# Patient Record
Sex: Male | Born: 1975 | Race: Black or African American | Hispanic: No | Marital: Single | Smoking: Never smoker
Health system: Southern US, Community
[De-identification: ages and names within clinical notes are randomized; demographics above are authoritative.]

## PROBLEM LIST (undated history)

## (undated) DIAGNOSIS — I10 Essential (primary) hypertension: Secondary | ICD-10-CM

## (undated) DIAGNOSIS — D649 Anemia, unspecified: Secondary | ICD-10-CM

## (undated) DIAGNOSIS — N289 Disorder of kidney and ureter, unspecified: Secondary | ICD-10-CM

## (undated) DIAGNOSIS — T7840XA Allergy, unspecified, initial encounter: Secondary | ICD-10-CM

## (undated) DIAGNOSIS — K219 Gastro-esophageal reflux disease without esophagitis: Secondary | ICD-10-CM

## (undated) DIAGNOSIS — R011 Cardiac murmur, unspecified: Secondary | ICD-10-CM

## (undated) DIAGNOSIS — K922 Gastrointestinal hemorrhage, unspecified: Secondary | ICD-10-CM

## (undated) DIAGNOSIS — K921 Melena: Secondary | ICD-10-CM

## (undated) DIAGNOSIS — Z992 Dependence on renal dialysis: Secondary | ICD-10-CM

## (undated) DIAGNOSIS — C801 Malignant (primary) neoplasm, unspecified: Secondary | ICD-10-CM

## (undated) DIAGNOSIS — N186 End stage renal disease: Secondary | ICD-10-CM

## (undated) DIAGNOSIS — E875 Hyperkalemia: Secondary | ICD-10-CM

## (undated) DIAGNOSIS — Z9289 Personal history of other medical treatment: Secondary | ICD-10-CM

## (undated) DIAGNOSIS — Z940 Kidney transplant status: Secondary | ICD-10-CM

## (undated) HISTORY — DX: Allergy, unspecified, initial encounter: T78.40XA

## (undated) SURGERY — COLONOSCOPY
Anesthesia: Moderate Sedation

---

## 1999-08-01 ENCOUNTER — Ambulatory Visit (HOSPITAL_COMMUNITY): Admission: RE | Admit: 1999-08-01 | Discharge: 1999-08-01 | Payer: Self-pay | Admitting: *Deleted

## 1999-08-02 ENCOUNTER — Observation Stay (HOSPITAL_COMMUNITY): Admission: EM | Admit: 1999-08-02 | Discharge: 1999-08-02 | Payer: Self-pay | Admitting: Nephrology

## 2005-10-21 ENCOUNTER — Ambulatory Visit (HOSPITAL_COMMUNITY): Admission: RE | Admit: 2005-10-21 | Discharge: 2005-10-21 | Payer: Self-pay | Admitting: *Deleted

## 2006-01-14 HISTORY — PX: ARTERIOVENOUS GRAFT PLACEMENT: SUR1029

## 2006-01-29 ENCOUNTER — Ambulatory Visit: Admission: RE | Admit: 2006-01-29 | Discharge: 2006-01-29 | Payer: Self-pay | Admitting: *Deleted

## 2006-08-11 ENCOUNTER — Ambulatory Visit (HOSPITAL_COMMUNITY): Admission: RE | Admit: 2006-08-11 | Discharge: 2006-08-11 | Payer: Self-pay | Admitting: Nephrology

## 2009-03-06 ENCOUNTER — Ambulatory Visit (HOSPITAL_COMMUNITY): Admission: RE | Admit: 2009-03-06 | Discharge: 2009-03-06 | Payer: Self-pay | Admitting: Vascular Surgery

## 2011-03-26 LAB — POCT I-STAT 4, (NA,K, GLUC, HGB,HCT)
Glucose, Bld: 86 mg/dL (ref 70–99)
HCT: 43 % (ref 39.0–52.0)
Hemoglobin: 14.6 g/dL (ref 13.0–17.0)
Potassium: 5 mEq/L (ref 3.5–5.1)
Sodium: 136 mEq/L (ref 135–145)

## 2011-05-01 NOTE — Op Note (Signed)
Tim Baker, BOURBON             ACCOUNT NO.:  [id number]   MEDICAL RECORD NO.:  [id number]          PATIENT TYPE:  AMB   LOCATION:  SDS                          FACILITY:  [facility]   PHYSICIAN:  Dorothea Glassman, M.D.    DATE OF BIRTH:  23-Dec-1975   DATE OF PROCEDURE:  10/21/2005  DATE OF DISCHARGE:                                 OPERATIVE REPORT   SURGEON:  Dorothea Glassman, M.D.   ASSISTANT:  Nurse.   ANESTHETIC:  Local with MAC.   PREOPERATIVE DIAGNOSIS:  Chronic renal insufficiency.   POSTOPERATIVE DIAGNOSIS:  Chronic renal insufficiency.   PROCEDURE:  Left CIMINO arteriovenous fistula.   OPERATIVE PROCEDURE:  The patient brought to the operating room in stable  condition. Placed in supine position. Left arm prepped and draped in a  sterile fashion.   Skin and subcutaneous tissues instilled with 1% Xylocaine with epinephrine.  A longitudinal skin incision made through the left wrist. Dissection carried  down to expose the cephalic vein. This was 4 mm in size. Cephalic vein  freed. Tributaries ligated with 4-0 silk and divided. The vein ligated  distally with 3-0 silk; divided, and easily dilated to 4 mm. The vein  controlled with a bulldog clamp. The patient administered 3000 units heparin  intravenously. The radial artery exposed. This was 3 mm in size. The radial  artery freed. Tributaries controlled with fine clips. Radial artery  controlled proximally and distally with bulldog clamps. A longitudinal  arteriotomy made. The vein anastomosed end-to-side to the radial artery  using running 7-0 Prolene suture. Clamps were then removed. Excellent flow  present. Adequate hemostasis obtained. Sponge and instrument counts correct.   Subcutaneous tissue closed running 3-0 Vicryl suture. Skin closed 4-0  Monocryl. Steri-Strips applied. No apparent complications. The patient  transferred to recovery room in stable condition.      Dorothea Glassman, M.D.  Electronically  Signed     PGH/MEDQ  D:  10/21/2005  T:  10/21/2005  Job:  [id number]

## 2011-05-01 NOTE — Op Note (Signed)
[name], Tim Baker             ACCOUNT NO.:  [id number]   MEDICAL RECORD NO.:  [id number]          PATIENT TYPE:  AMB   LOCATION:  DFTL                         FACILITY:  [facility]   PHYSICIAN:  Nelda Severe. Kellie Simmering, M.D.  DATE OF BIRTH:  1976/01/15   DATE OF PROCEDURE:  01/29/2006  DATE OF DISCHARGE:  01/29/2006                                 OPERATIVE REPORT   PREOPERATIVE DIAGNOSIS:  End stage renal disease with poorly developing  arteriovenous fistula, left forearm.   POSTOPERATIVE DIAGNOSIS:  End stage renal disease with poorly developing  arteriovenous fistula, left forearm.   OPERATION:  Revision arteriovenous fistula left forearm by ligation of two  completing branches.   SURGEON:  Nelda Severe. Kellie Simmering, M.D.   ASSISTANT:  Faylene Million Dominick, P.A.-C.   ANESTHESIA:  Local.   PROCEDURE:  The patient was taken to the operating room and placed in the  supine position at which time the left upper extremity was prepped with  Betadine scrub solution and draped in routine sterile manner.  Two the areas  of concern had been marked in the left upper extremity, one which was a  proximal branch from the fistula near the wrist competing with the flow and  midway up the arm, there were two parallel cephalic vein systems, the  smaller of which was to be ligated.  After infiltration with 1% Xylocaine  with epinephrine, a short transverse incision was made just proximal to the  wrist directly over the side branch which was dissected free with Metzenbaum  scissors and encircled with 3-0 silk ties and divided.  Following this, a  second incision was made in the mid forearm overlying the lateral most  branch of the cephalic vein where it divided and became bifid.  The lateral  branch was dissected free, encircled with silk tie, and temporarily  compressed, and this did seem to slightly augment the flow in the fistula  over the medial branch which was larger. The lateral branch was ligated.  Adequate hemostasis achieved, and the two wounds closed with Vicryl in a  subcuticular fashion. Sterile dressing applied.  The patient taken to  recovery in satisfactory condition.           ______________________________  Nelda Severe Kellie Simmering, M.D.     JDL/MEDQ  D:  01/29/2006  T:  01/29/2006  Job:  [id number]

## 2011-06-19 ENCOUNTER — Other Ambulatory Visit (HOSPITAL_COMMUNITY): Payer: Self-pay | Admitting: Nephrology

## 2011-06-19 DIAGNOSIS — N186 End stage renal disease: Secondary | ICD-10-CM

## 2011-06-26 ENCOUNTER — Ambulatory Visit (HOSPITAL_COMMUNITY): Payer: Self-pay

## 2011-07-01 ENCOUNTER — Ambulatory Visit (HOSPITAL_COMMUNITY): Admission: RE | Admit: 2011-07-01 | Payer: Self-pay | Source: Ambulatory Visit

## 2011-07-06 ENCOUNTER — Other Ambulatory Visit (HOSPITAL_COMMUNITY): Payer: Self-pay | Admitting: Nephrology

## 2011-07-06 DIAGNOSIS — N186 End stage renal disease: Secondary | ICD-10-CM

## 2011-07-28 ENCOUNTER — Ambulatory Visit (HOSPITAL_COMMUNITY)
Admission: RE | Admit: 2011-07-28 | Discharge: 2011-07-28 | Disposition: A | Discharge status: Home / Self Care | Payer: Medicare Other | Source: Ambulatory Visit | Attending: Nephrology | Admitting: Nephrology

## 2011-07-28 ENCOUNTER — Ambulatory Visit (HOSPITAL_COMMUNITY): Payer: Self-pay

## 2011-07-28 DIAGNOSIS — T82898A Other specified complication of vascular prosthetic devices, implants and grafts, initial encounter: Secondary | ICD-10-CM | POA: Insufficient documentation

## 2011-07-28 DIAGNOSIS — N186 End stage renal disease: Secondary | ICD-10-CM | POA: Insufficient documentation

## 2011-07-28 DIAGNOSIS — Y832 Surgical operation with anastomosis, bypass or graft as the cause of abnormal reaction of the patient, or of later complication, without mention of misadventure at the time of the procedure: Secondary | ICD-10-CM | POA: Insufficient documentation

## 2011-07-28 MED ORDER — IOHEXOL 300 MG/ML  SOLN
100.0000 mL | Freq: Once | INTRAMUSCULAR | Status: AC | PRN
  Administered 2011-07-28: 50 mL via INTRAVENOUS

## 2011-12-15 HISTORY — PX: KIDNEY TRANSPLANT: SHX239

## 2015-11-14 HISTORY — PX: AV FISTULA REPAIR: SHX563

## 2015-11-14 HISTORY — PX: INSERTION OF DIALYSIS CATHETER: SHX1324

## 2015-12-02 ENCOUNTER — Inpatient Hospital Stay (HOSPITAL_COMMUNITY)
Admission: EM | Admit: 2015-12-02 | Discharge: 2015-12-03 | DRG: 699 | Disposition: A | Discharge status: Other Acute Inpatient Hospital | Payer: Medicaid Other | Attending: Internal Medicine | Admitting: Internal Medicine

## 2015-12-02 ENCOUNTER — Inpatient Hospital Stay (HOSPITAL_COMMUNITY): Payer: Medicaid Other

## 2015-12-02 ENCOUNTER — Emergency Department (HOSPITAL_COMMUNITY): Payer: Medicaid Other

## 2015-12-02 ENCOUNTER — Encounter (HOSPITAL_COMMUNITY): Payer: Self-pay | Admitting: Emergency Medicine

## 2015-12-02 DIAGNOSIS — E875 Hyperkalemia: Secondary | ICD-10-CM | POA: Diagnosis present

## 2015-12-02 DIAGNOSIS — N189 Chronic kidney disease, unspecified: Secondary | ICD-10-CM | POA: Diagnosis present

## 2015-12-02 DIAGNOSIS — Z598 Other problems related to housing and economic circumstances: Secondary | ICD-10-CM

## 2015-12-02 DIAGNOSIS — R0989 Other specified symptoms and signs involving the circulatory and respiratory systems: Secondary | ICD-10-CM

## 2015-12-02 DIAGNOSIS — J029 Acute pharyngitis, unspecified: Secondary | ICD-10-CM | POA: Diagnosis present

## 2015-12-02 DIAGNOSIS — E872 Acidosis, unspecified: Secondary | ICD-10-CM

## 2015-12-02 DIAGNOSIS — Y830 Surgical operation with transplant of whole organ as the cause of abnormal reaction of the patient, or of later complication, without mention of misadventure at the time of the procedure: Secondary | ICD-10-CM | POA: Diagnosis present

## 2015-12-02 DIAGNOSIS — K529 Noninfective gastroenteritis and colitis, unspecified: Secondary | ICD-10-CM | POA: Diagnosis present

## 2015-12-02 DIAGNOSIS — I129 Hypertensive chronic kidney disease with stage 1 through stage 4 chronic kidney disease, or unspecified chronic kidney disease: Secondary | ICD-10-CM | POA: Diagnosis present

## 2015-12-02 DIAGNOSIS — Z940 Kidney transplant status: Secondary | ICD-10-CM | POA: Diagnosis present

## 2015-12-02 DIAGNOSIS — D631 Anemia in chronic kidney disease: Secondary | ICD-10-CM | POA: Diagnosis present

## 2015-12-02 DIAGNOSIS — I10 Essential (primary) hypertension: Secondary | ICD-10-CM | POA: Diagnosis present

## 2015-12-02 DIAGNOSIS — Z79899 Other long term (current) drug therapy: Secondary | ICD-10-CM

## 2015-12-02 DIAGNOSIS — N179 Acute kidney failure, unspecified: Secondary | ICD-10-CM | POA: Diagnosis present

## 2015-12-02 DIAGNOSIS — T8611 Kidney transplant rejection: Secondary | ICD-10-CM | POA: Diagnosis present

## 2015-12-02 HISTORY — DX: Essential (primary) hypertension: I10

## 2015-12-02 HISTORY — DX: Disorder of kidney and ureter, unspecified: N28.9

## 2015-12-02 LAB — LIPASE, BLOOD: Lipase: 68 U/L — ABNORMAL HIGH (ref 11–51)

## 2015-12-02 LAB — CBC
HCT: 33.7 % — ABNORMAL LOW (ref 39.0–52.0)
HCT: 33 % — ABNORMAL LOW (ref 39.0–52.0)
Hemoglobin: 10.7 g/dL — ABNORMAL LOW (ref 13.0–17.0)
Hemoglobin: 10.8 g/dL — ABNORMAL LOW (ref 13.0–17.0)
MCH: 25.5 pg — ABNORMAL LOW (ref 26.0–34.0)
MCH: 26 pg (ref 26.0–34.0)
MCHC: 31.8 g/dL (ref 30.0–36.0)
MCHC: 32.7 g/dL (ref 30.0–36.0)
MCV: 80.2 fL (ref 78.0–100.0)
MCV: 79.5 fL (ref 78.0–100.0)
Platelets: 379 10*3/uL (ref 150–400)
Platelets: 392 10*3/uL (ref 150–400)
RBC: 4.2 MIL/uL — ABNORMAL LOW (ref 4.22–5.81)
RBC: 4.15 MIL/uL — ABNORMAL LOW (ref 4.22–5.81)
RDW: 14 % (ref 11.5–15.5)
RDW: 14 % (ref 11.5–15.5)
WBC: 6.9 10*3/uL (ref 4.0–10.5)
WBC: 9.2 10*3/uL (ref 4.0–10.5)

## 2015-12-02 LAB — I-STAT CG4 LACTIC ACID, ED: Lactic Acid, Venous: 0.38 mmol/L — ABNORMAL LOW (ref 0.5–2.0)

## 2015-12-02 LAB — COMPREHENSIVE METABOLIC PANEL
ALT: 19 U/L (ref 17–63)
AST: 8 U/L — ABNORMAL LOW (ref 15–41)
Albumin: 3.3 g/dL — ABNORMAL LOW (ref 3.5–5.0)
Alkaline Phosphatase: 66 U/L (ref 38–126)
Anion gap: 24 — ABNORMAL HIGH (ref 5–15)
BUN: 85 mg/dL — ABNORMAL HIGH (ref 6–20)
CO2: 9 mmol/L — ABNORMAL LOW (ref 22–32)
Calcium: 8.3 mg/dL — ABNORMAL LOW (ref 8.9–10.3)
Chloride: 97 mmol/L — ABNORMAL LOW (ref 101–111)
Creatinine, Ser: 15.1 mg/dL — ABNORMAL HIGH (ref 0.61–1.24)
GFR calc Af Amer: 4 mL/min — ABNORMAL LOW (ref >60)
GFR calc non Af Amer: 3 mL/min — ABNORMAL LOW (ref >60)
Glucose, Bld: 94 mg/dL (ref 65–99)
Potassium: >7.5 mmol/L (ref 3.5–5.1)
Sodium: 130 mmol/L — ABNORMAL LOW (ref 135–145)
Total Bilirubin: 0.2 mg/dL — ABNORMAL LOW (ref 0.3–1.2)
Total Protein: 8.3 g/dL — ABNORMAL HIGH (ref 6.5–8.1)

## 2015-12-02 LAB — PROTIME-INR
INR: 1.31 (ref 0.00–1.49)
Prothrombin Time: 16.5 seconds — ABNORMAL HIGH (ref 11.6–15.2)

## 2015-12-02 LAB — MRSA PCR SCREENING: MRSA by PCR: NEGATIVE

## 2015-12-02 LAB — LACTIC ACID, PLASMA: Lactic Acid, Venous: 0.5 mmol/L (ref 0.5–2.0)

## 2015-12-02 LAB — I-STAT TROPONIN, ED: Troponin i, poc: 0.02 ng/mL (ref 0.00–0.08)

## 2015-12-02 LAB — TROPONIN I: Troponin I: <0.03 ng/mL (ref <0.031)

## 2015-12-02 LAB — CBG MONITORING, ED: Glucose-Capillary: 81 mg/dL (ref 65–99)

## 2015-12-02 LAB — APTT: aPTT: 40 seconds — ABNORMAL HIGH (ref 24–37)

## 2015-12-02 MED ORDER — TACROLIMUS 1 MG PO CAPS
3.0000 mg | ORAL_CAPSULE | Freq: Every day | ORAL | Status: DC
  Administered 2015-12-03: 3 mg via ORAL
  Filled 2015-12-02 (×2): qty 3

## 2015-12-02 MED ORDER — METHYLPREDNISOLONE SODIUM SUCC 1000 MG IJ SOLR
500.0000 mg | Freq: Once | INTRAMUSCULAR | Status: DC
  Filled 2015-12-02: qty 4

## 2015-12-02 MED ORDER — MYCOPHENOLATE SODIUM 180 MG PO TBEC
540.0000 mg | DELAYED_RELEASE_TABLET | Freq: Two times a day (BID) | ORAL | Status: DC
  Administered 2015-12-03 (×2): 540 mg via ORAL
  Filled 2015-12-02 (×4): qty 3

## 2015-12-02 MED ORDER — SODIUM BICARBONATE 8.4 % IV SOLN
50.0000 meq | Freq: Once | INTRAVENOUS | Status: AC
  Administered 2015-12-02: 50 meq via INTRAVENOUS
  Filled 2015-12-02: qty 50

## 2015-12-02 MED ORDER — SODIUM CHLORIDE 0.9 % IV SOLN: 250.0000 mL | INTRAVENOUS | Status: DC | PRN

## 2015-12-02 MED ORDER — HYDROCODONE-ACETAMINOPHEN 5-325 MG PO TABS: 1.0000 | ORAL_TABLET | ORAL | Status: DC | PRN

## 2015-12-02 MED ORDER — ALBUTEROL SULFATE (2.5 MG/3ML) 0.083% IN NEBU
10.0000 mg | INHALATION_SOLUTION | Freq: Once | RESPIRATORY_TRACT | Status: AC
  Administered 2015-12-02: 10 mg via RESPIRATORY_TRACT
  Filled 2015-12-02: qty 12

## 2015-12-02 MED ORDER — SODIUM CHLORIDE 0.9 % IJ SOLN
3.0000 mL | INTRAMUSCULAR | Status: DC | PRN
  Administered 2015-12-03: 3 mL via INTRAVENOUS
  Filled 2015-12-02: qty 3

## 2015-12-02 MED ORDER — ALBUTEROL SULFATE (2.5 MG/3ML) 0.083% IN NEBU
INHALATION_SOLUTION | RESPIRATORY_TRACT | Status: AC
Start: 2015-12-02 — End: 2015-12-03
  Filled 2015-12-02: qty 12

## 2015-12-02 MED ORDER — SODIUM CHLORIDE 0.9 % IJ SOLN
3.0000 mL | Freq: Two times a day (BID) | INTRAMUSCULAR | Status: DC
  Administered 2015-12-03: 3 mL via INTRAVENOUS

## 2015-12-02 MED ORDER — HEPARIN SODIUM (PORCINE) 5000 UNIT/ML IJ SOLN
5000.0000 [IU] | Freq: Three times a day (TID) | INTRAMUSCULAR | Status: DC
  Administered 2015-12-03 (×2): 5000 [IU] via SUBCUTANEOUS
  Filled 2015-12-02 (×4): qty 1

## 2015-12-02 MED ORDER — MAGIC MOUTHWASH W/LIDOCAINE
5.0000 mL | Freq: Four times a day (QID) | ORAL | Status: DC
  Administered 2015-12-03 (×3): 5 mL via ORAL
  Filled 2015-12-02 (×6): qty 5

## 2015-12-02 MED ORDER — INSULIN ASPART 100 UNIT/ML IV SOLN
10.0000 [IU] | Freq: Once | INTRAVENOUS | Status: AC
  Administered 2015-12-02: 10 [IU] via INTRAVENOUS
  Filled 2015-12-02: qty 0.1

## 2015-12-02 MED ORDER — ALBUTEROL SULFATE (2.5 MG/3ML) 0.083% IN NEBU: 2.5000 mg | INHALATION_SOLUTION | RESPIRATORY_TRACT | Status: DC | PRN

## 2015-12-02 MED ORDER — AMLODIPINE BESYLATE 10 MG PO TABS
10.0000 mg | ORAL_TABLET | Freq: Every day | ORAL | Status: DC
  Administered 2015-12-03 (×2): 10 mg via ORAL
  Filled 2015-12-02 (×3): qty 1

## 2015-12-02 MED ORDER — ACETAMINOPHEN 325 MG PO TABS: 650.0000 mg | ORAL_TABLET | Freq: Four times a day (QID) | ORAL | Status: DC | PRN

## 2015-12-02 MED ORDER — HEPARIN SODIUM (PORCINE) 1000 UNIT/ML DIALYSIS: 20.0000 [IU]/kg | INTRAMUSCULAR | Status: DC | PRN

## 2015-12-02 MED ORDER — ONDANSETRON HCL 4 MG/2ML IJ SOLN: 4.0000 mg | Freq: Four times a day (QID) | INTRAMUSCULAR | Status: DC | PRN

## 2015-12-02 MED ORDER — SODIUM CHLORIDE 0.9 % IV SOLN
1.0000 g | Freq: Once | INTRAVENOUS | Status: AC
  Administered 2015-12-02: 1 g via INTRAVENOUS
  Filled 2015-12-02: qty 10

## 2015-12-02 MED ORDER — DEXTROSE 50 % IV SOLN
1.0000 | Freq: Once | INTRAVENOUS | Status: AC
  Administered 2015-12-02: 50 mL via INTRAVENOUS
  Filled 2015-12-02: qty 50

## 2015-12-02 MED ORDER — TACROLIMUS 1 MG PO CAPS: 4.0000 mg | ORAL_CAPSULE | Freq: Every morning | ORAL | Status: DC

## 2015-12-02 MED ORDER — SODIUM CHLORIDE 0.9 % IV SOLN
1000.0000 mg | Freq: Every day | INTRAVENOUS | Status: DC
  Administered 2015-12-03: 1000 mg via INTRAVENOUS
  Filled 2015-12-02 (×2): qty 8

## 2015-12-02 MED ORDER — PENTAFLUOROPROP-TETRAFLUOROETH EX AERO: 1.0000 "application " | INHALATION_SPRAY | CUTANEOUS | Status: DC | PRN

## 2015-12-02 MED ORDER — SODIUM CHLORIDE 0.9 % IV SOLN: 100.0000 mL | INTRAVENOUS | Status: DC | PRN

## 2015-12-02 MED ORDER — ALTEPLASE 2 MG IJ SOLR: 2.0000 mg | Freq: Once | INTRAMUSCULAR | Status: DC | PRN

## 2015-12-02 MED ORDER — GUAIFENESIN 100 MG/5ML PO SOLN
5.0000 mL | ORAL | Status: DC | PRN
  Filled 2015-12-02: qty 5

## 2015-12-02 MED ORDER — SODIUM POLYSTYRENE SULFONATE 15 GM/60ML PO SUSP
30.0000 g | Freq: Once | ORAL | Status: AC
  Administered 2015-12-02: 30 g via ORAL
  Filled 2015-12-02 (×2): qty 120

## 2015-12-02 MED ORDER — LIDOCAINE HCL (PF) 1 % IJ SOLN: 5.0000 mL | INTRAMUSCULAR | Status: DC | PRN

## 2015-12-02 MED ORDER — ONDANSETRON HCL 4 MG PO TABS: 4.0000 mg | ORAL_TABLET | Freq: Four times a day (QID) | ORAL | Status: DC | PRN

## 2015-12-02 MED ORDER — SULFAMETHOXAZOLE-TRIMETHOPRIM 800-160 MG PO TABS
1.0000 | ORAL_TABLET | ORAL | Status: DC
Start: 2015-12-02 — End: 2015-12-03
  Administered 2015-12-02: 1 via ORAL
  Filled 2015-12-02 (×2): qty 1

## 2015-12-02 MED ORDER — MORPHINE SULFATE (PF) 2 MG/ML IV SOLN: 2.0000 mg | INTRAVENOUS | Status: DC | PRN

## 2015-12-02 MED ORDER — ACETAMINOPHEN 650 MG RE SUPP: 650.0000 mg | Freq: Four times a day (QID) | RECTAL | Status: DC | PRN

## 2015-12-02 MED ORDER — HEPARIN SODIUM (PORCINE) 1000 UNIT/ML DIALYSIS: 1000.0000 [IU] | INTRAMUSCULAR | Status: DC | PRN

## 2015-12-02 MED ORDER — PREDNISONE 10 MG PO TABS: 5.0000 mg | ORAL_TABLET | Freq: Every day | ORAL | Status: DC

## 2015-12-02 MED ORDER — LIDOCAINE-PRILOCAINE 2.5-2.5 % EX CREA: 1.0000 "application " | TOPICAL_CREAM | CUTANEOUS | Status: DC | PRN

## 2015-12-02 MED ORDER — TACROLIMUS 1 MG PO CAPS
1.0000 mg | ORAL_CAPSULE | Freq: Two times a day (BID) | ORAL | Status: DC
  Filled 2015-12-02: qty 1

## 2015-12-02 MED ORDER — LABETALOL HCL 200 MG PO TABS
300.0000 mg | ORAL_TABLET | Freq: Two times a day (BID) | ORAL | Status: DC
  Administered 2015-12-03 (×2): 300 mg via ORAL
  Filled 2015-12-02: qty 2
  Filled 2015-12-02: qty 1
  Filled 2015-12-02: qty 2

## 2015-12-02 NOTE — ED Notes (Signed)
Per EMS, Pt c/o diarrhea x 1 week. Pt also had a sore throat and pt was given amoxicillin for it. Pt has been taking without improvement. Pt c/o weakness, unable to eat, fatigue. Pt has hx of kidney transplant in 2003. Pt has a fistula on the L arm. A&Ox4. Pt from home. Pt has a roommate.

## 2015-12-02 NOTE — Progress Notes (Signed)
Pt arrived to [facility] at 2045. Pt is very lethargic but awakens with stimulation. Hypertensive with syst 190's, NSR, no complaints of pain.  RN brought pt up to dialysis at 2115.

## 2015-12-02 NOTE — Consult Note (Signed)
Asked by Dr. Nicolette Bang to see this 39 year old male with a history of a deceased doner kidney transplant 03/26/12 at [facility] and baseline creat of 2.1-2.3 followed by Dr. Marval Regal.  He has done well. Pt last seen by him on August 01, 2015 for a 6 month follow up. Creat on 07/26/15 was 1.'96mg'$ /dl at [facility]. Unfortunately, the patient recently changed jobs and had an inferior insurance plan which did not adequately cover the immunosuppressant medications.  The patient reports being out of anti-rejection meds for two weeks and he "did not know who to turn to for help".  He has had malaise, arthralgias, diarrhea and a sore throat and apparently received antibiotics.  He has had persistent malaise and presented to [facility] ED with a BUN of 85, creat of 15.10, bicarb of 9 and potassium of > 7.5 with peaked T waves which was treatted in the ED with the standard medication.  Dr. Ronalee Red was contacted at [facility] who recommended medical stabilization. He receives Mycophenolic acid '540mg'$  BID, Prednisone 5 mg daily, and Tacrolimus '4mg'$  in AM and '3mg'$  in PM as antirejection regimen(according to the August 2016 note).  He denies gross hematuria or abdominal pain.  Past Medical History  Diagnosis Date  . Hypertension   . Renal disorder    Past Surgical History  Procedure Laterality Date  . Kidney transplant      2013   Social History:  reports that he has never smoked. He has never used smokeless tobacco. He reports that he drinks alcohol. He reports that he does not use illicit drugs. Allergies: No Known Allergies No family history on file.  Medications:  Prior to Admission:  Prescriptions prior to admission  Medication Sig Dispense Refill Last Dose  . amLODipine (NORVASC) 10 MG tablet Take 10 mg by mouth daily.   12/01/2015 at Unknown time  . amoxicillin (AMOXIL) 875 MG tablet Take 875 mg by mouth 2 (two) times daily.   12/02/2015 at Unknown time  . guaiFENesin (ROBITUSSIN) 100 MG/5ML SOLN Take 5 mLs by  mouth every 4 (four) hours as needed for cough or to loosen phlegm.   12/01/2015 at Unknown time  . labetalol (NORMODYNE) 300 MG tablet Take 300 mg by mouth 2 (two) times daily.   12/02/2015 at 0900  . magic mouthwash w/lidocaine SOLN Take 5 mLs by mouth 4 (four) times daily. Swish,gargle, and spit   12/02/2015 at Unknown time  . mycophenolate (MYFORTIC) 180 MG EC tablet Take 540 mg by mouth 2 (two) times daily.   12/01/2015 at Unknown time  . predniSONE (DELTASONE) 5 MG tablet Take 5 mg by mouth daily.  6 12/01/2015 at Unknown time  . sulfamethoxazole-trimethoprim (BACTRIM DS,SEPTRA DS) 800-160 MG tablet Take 1 tablet by mouth 3 (three) times a week. Every Monday, Wednesday, and Friday   11/29/2015  . tacrolimus (PROGRAF) 1 MG capsule Take 1 mg by mouth 2 (two) times daily.   12/01/2015 at Unknown time   Scheduled: . albuterol      . amLODipine  10 mg Oral Daily  . heparin  5,000 Units Subcutaneous 3 times per day  . labetalol  300 mg Oral BID  . magic mouthwash w/lidocaine  5 mL Oral QID  . methylPREDNISolone (SOLU-MEDROL) injection  500 mg Intravenous Once  . mycophenolate  540 mg Oral BID  . [START ON 12/03/2015] predniSONE  5 mg Oral Daily  . sodium chloride  3 mL Intravenous Q12H  . sodium chloride  3 mL Intravenous  Q12H  . sulfamethoxazole-trimethoprim  1 tablet Oral Once per day on Mon Wed Fri  . tacrolimus  1 mg Oral BID   ROS: as per HPI  Blood pressure 192/102, pulse 70, temperature 97.5 F (36.4 C), resp. rate 18, SpO2 99 %.  General appearance: alert, cooperative and easily falls asleep Head: Normocephalic, without obvious abnormality, atraumatic Eyes: conjunctivae/corneas clear. PERRL, EOM's intact. Fundi benign. Ears: normal TM's and external ear canals both ears Nose: Nares normal. Septum midline. Mucosa normal. No drainage or sinus tenderness. Throat: lips, mucosa, and tongue normal; teeth and gums normal Resp: clear to auscultation bilaterally Chest wall:  nontender Cardio: regular rate and rhythm, S1, S2 normal, no murmur, click, rub or gallop GI: very tender renal allograft RLQ  Otherwise obese and nontender Extremities: edema tr Skin: Skin color, texture, turgor normal. No rashes or lesions Neurologic: Grossly normal Results for orders placed or performed during the hospital encounter of 12/02/15 (from the past 48 hour(s))  Lipase, blood     Status: Abnormal   Collection Time: 12/02/15  3:57 PM  Result Value Ref Range   Lipase 68 (H) 11 - 51 U/L  Comprehensive metabolic panel     Status: Abnormal   Collection Time: 12/02/15  3:57 PM  Result Value Ref Range   Sodium 130 (L) 135 - 145 mmol/L    Comment: REPEATED TO VERIFY   Potassium >7.5 (HH) 3.5 - 5.1 mmol/L    Comment: CRITICAL RESULT CALLED TO, READ BACK BY AND VERIFIED WITH: L.BROWN RN [id number] ON 12.19.16 BY W.BUCHHOLZ. REPEATED TO VERIFY NO VISIBLE HEMOLYSIS PT IN RENAL FAILURE RN CONFIRMED RESULTS AS EXPECTED.    Chloride 97 (L) 101 - 111 mmol/L    Comment: REPEATED TO VERIFY   CO2 9 (L) 22 - 32 mmol/L    Comment: REPEATED TO VERIFY   Glucose, Bld 94 65 - 99 mg/dL   BUN 85 (H) 6 - 20 mg/dL    Comment: RESULTS CONFIRMED BY MANUAL DILUTION   Creatinine, Ser 15.10 (H) 0.61 - 1.24 mg/dL    Comment: RESULTS CONFIRMED BY MANUAL DILUTION   Calcium 8.3 (L) 8.9 - 10.3 mg/dL    Comment: REPEATED TO VERIFY   Total Protein 8.3 (H) 6.5 - 8.1 g/dL   Albumin 3.3 (L) 3.5 - 5.0 g/dL   AST 8 (L) 15 - 41 U/L   ALT 19 17 - 63 U/L   Alkaline Phosphatase 66 38 - 126 U/L   Total Bilirubin 0.2 (L) 0.3 - 1.2 mg/dL   GFR calc non Af Amer 3 (L) >60 mL/min   GFR calc Af Amer 4 (L) >60 mL/min    Comment: (NOTE) The eGFR has been calculated using the CKD EPI equation. This calculation has not been validated in all clinical situations. eGFR's persistently <60 mL/min signify possible Chronic Kidney Disease.    Anion gap 24 (H) 5 - 15  CBC     Status: Abnormal   Collection Time: 12/02/15  3:57  PM  Result Value Ref Range   WBC 6.9 4.0 - 10.5 K/uL   RBC 4.20 (L) 4.22 - 5.81 MIL/uL   Hemoglobin 10.7 (L) 13.0 - 17.0 g/dL   HCT 33.7 (L) 39.0 - 52.0 %   MCV 80.2 78.0 - 100.0 fL   MCH 25.5 (L) 26.0 - 34.0 pg   MCHC 31.8 30.0 - 36.0 g/dL   RDW 14.0 11.5 - 15.5 %   Platelets 379 150 - 400 K/uL  I-Stat Troponin, ED (not  at [facility])     Status: None   Collection Time: 12/02/15  4:54 PM  Result Value Ref Range   Troponin i, poc 0.02 0.00 - 0.08 ng/mL   Comment 3            Comment: Due to the release kinetics of cTnI, a negative result within the first hours of the onset of symptoms does not rule out myocardial infarction with certainty. If myocardial infarction is still suspected, repeat the test at appropriate intervals.   I-Stat CG4 Lactic Acid, ED     Status: Abnormal   Collection Time: 12/02/15  4:57 PM  Result Value Ref Range   Lactic Acid, Venous 0.38 (L) 0.5 - 2.0 mmol/L  CBG monitoring, ED     Status: None   Collection Time: 12/02/15  5:29 PM  Result Value Ref Range   Glucose-Capillary 81 65 - 99 mg/dL   Dg Abd Acute W/chest  12/02/2015  CLINICAL DATA:  Diarrhea for 1-1/2 weeks with nausea, chills, antibiotic usage, weight loss EXAM: DG ABDOMEN ACUTE W/ 1V CHEST COMPARISON:  03/06/2009 FINDINGS: Heart size and vascular pattern normal.Mild left lower lobe infiltrate or atelectasis. No free air in the abdomen. No enlarged bowel loops. Loop of bowel in mid pelvis shows possible wall thickening, likely sigmoid colon. Scattered small and large bowel air fluid levels. IMPRESSION: Air fluid levels suggest enterocolitis; possible mural inflammation distal colon. CT abdomen and pelvis recommended. Electronically Signed   By: Skipper Cliche M.D.   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 2 Deceased donor kidney transplant 3 Hyperkalemia w/ EKG changes 4 Financial resource limitations  Plan: 1 Emergent dialysis 2 Pulse high dose  steroids (Solumedrol 1000 mg tonight) 3 Transfer to [facility] for renal biopsy and further treatment to salvage transplant, after stabilized 4 F/U Urine analysis 5 Imaging studies after transfer as needed   Kenetha Cozza C 12/02/2015, 10:03 PM                             Assessment:  Hyperkalemia   AKI, rule out rejection   Deceased donor kidney transplant 2013 with baseline creat 2.1-2.3  Plan:    Emergency dialysis at [facility] via AVF  Steroid bolus  Hold Tacrolimus pending level

## 2015-12-02 NOTE — ED Notes (Signed)
Patient resting comfortably.  Patient's Ca++ finished.

## 2015-12-02 NOTE — ED Notes (Signed)
[facility] notified of need for transport

## 2015-12-02 NOTE — ED Notes (Signed)
Blood draw attempted-unsuccessful.  Merleen Nicely, RN at [facility] made aware.

## 2015-12-02 NOTE — Procedures (Signed)
Tolerating hemodialysis via left arm AV access.  AVF functioning OK. No hemodynamic instability.  Pt sleepy. Tim Baker C

## 2015-12-02 NOTE — ED Notes (Signed)
Delay in vitals [facility] is his for transportation

## 2015-12-02 NOTE — ED Notes (Signed)
Bed: [id number] Expected date:  Expected time:  Means of arrival:  Comments: ems

## 2015-12-02 NOTE — ED Provider Notes (Addendum)
CSN: [id number]     Arrival date & time 12/02/15  1534 History   First MD Initiated Contact with Patient 12/02/15 1616     Chief Complaint  Patient presents with  . Diarrhea     (Consider location/radiation/quality/duration/timing/severity/associated sxs/prior Treatment) HPI Comments: 39 y.o. 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  The patient says that this has been ongoing and progressive over the last 1.5 weeks and that he was diagnosed with strep throat at that time but has been taking the Amoxicillin without improvement in symptoms.  He says that even before starting the antibiotic he was having diarrhea.  He says that he just does not feel well and feels extremely weak.  He denies abdominal pain, fever, chills, nausea, vomiting.  No headache or focal weakness.   Past Medical History  Diagnosis Date  . Hypertension   . Renal disorder    Past Surgical History  Procedure Laterality Date  . Kidney transplant      2013   No family history on file. Social History  Substance Use Topics  . Smoking status: Never Smoker   . Smokeless tobacco: Never Used  . Alcohol Use: Yes     Comment: one beer a month    Review of Systems  Constitutional: Positive for fatigue. Negative for fever and chills.  HENT: Positive for sore throat. Negative for congestion and rhinorrhea.   Eyes: Negative for visual disturbance.  Respiratory: Negative for cough, chest tightness and shortness of breath.   Cardiovascular: Negative for chest pain and palpitations.  Gastrointestinal: Negative for nausea, vomiting, abdominal pain, diarrhea and constipation.  Genitourinary: Positive for decreased urine volume. Negative for dysuria and hematuria.  Musculoskeletal: Positive for myalgias. Negative for back pain.  Skin: Negative for rash.  Neurological: Positive for weakness (generalized). Negative for dizziness, seizures,  light-headedness and numbness.      Allergies  Review of patient's allergies indicates no known allergies.  Home Medications   Prior to Admission medications   Medication Sig Start Date End Date Taking? Authorizing Provider  amLODipine (NORVASC) 10 MG tablet Take 10 mg by mouth daily.   Yes Historical Provider, MD  amoxicillin (AMOXIL) 875 MG tablet Take 875 mg by mouth 2 (two) times daily.   Yes Historical Provider, MD  guaiFENesin (ROBITUSSIN) 100 MG/5ML SOLN Take 5 mLs by mouth every 4 (four) hours as needed for cough or to loosen phlegm.   Yes Historical Provider, MD  labetalol (NORMODYNE) 300 MG tablet Take 300 mg by mouth 2 (two) times daily.   Yes Historical Provider, MD  magic mouthwash w/lidocaine SOLN Take 5 mLs by mouth 4 (four) times daily. Swish,gargle, and spit   Yes Historical Provider, MD  mycophenolate (MYFORTIC) 180 MG EC tablet Take 540 mg by mouth 2 (two) times daily.   Yes Historical Provider, MD  predniSONE (DELTASONE) 5 MG tablet Take 5 mg by mouth daily. 09/09/15  Yes Historical Provider, MD  sulfamethoxazole-trimethoprim (BACTRIM DS,SEPTRA DS) 800-160 MG tablet Take 1 tablet by mouth 3 (three) times a week. Every Monday, Wednesday, and Friday   Yes Historical Provider, MD  tacrolimus (PROGRAF) 1 MG capsule Take 1 mg by mouth 2 (two) times daily.   Yes Historical Provider, MD   BP 184/87 mmHg  Pulse 72  Temp(Src) 97.5 F (36.4 C)  Resp 20  SpO2 100% Physical Exam  Constitutional: He is oriented to person, place, and time. No  distress.  HENT:  Head: Normocephalic and atraumatic.  Mouth/Throat: Mucous membranes are not dry. No posterior oropharyngeal edema, posterior oropharyngeal erythema or tonsillar abscesses.  Large uvula without edema or erythema  Eyes: EOM are normal. Pupils are equal, round, and reactive to light.  Neck: Normal range of motion. Neck supple.  Pulmonary/Chest: Tachypnea noted. He has no decreased breath sounds. He has no wheezes. He has  no rhonchi.  Abdominal: Normal appearance and bowel sounds are normal. There is no tenderness.  No tenderness over the transplanted kidney  Musculoskeletal: He exhibits no edema or tenderness.  Neurological: He is alert and oriented to person, place, and time.  Skin: Skin is warm and dry. No rash noted. He is not diaphoretic.    ED Course  Procedures (including critical care time)  CRITICAL CARE Performed by: Earlie Server   Total critical care time: 45 minutes  Critical care time was exclusive of separately billable procedures and treating other patients.  Critical care was necessary to treat or prevent imminent or life-threatening deterioration.  Critical care was time spent personally by me on the following activities: development of treatment plan with patient and/or surrogate as well as nursing, discussions with consultants, evaluation of patient's response to treatment, examination of patient, obtaining history from patient or surrogate, ordering and performing treatments and interventions, ordering and review of laboratory studies, ordering and review of radiographic studies, pulse oximetry and re-evaluation of patient's condition.  Labs Review Labs Reviewed  LIPASE, BLOOD - Abnormal; Notable for the following:    Lipase 68 (*)    All other components within normal limits  COMPREHENSIVE METABOLIC PANEL - Abnormal; Notable for the following:    Sodium 130 (*)    Potassium >7.5 (*)    Chloride 97 (*)    CO2 9 (*)    BUN 85 (*)    Creatinine, Ser 15.10 (*)    Calcium 8.3 (*)    Total Protein 8.3 (*)    Albumin 3.3 (*)    AST 8 (*)    Total Bilirubin 0.2 (*)    GFR calc non Af Amer 3 (*)    GFR calc Af Amer 4 (*)    Anion gap 24 (*)    All other components within normal limits  CBC - Abnormal; Notable for the following:    RBC 4.20 (*)    Hemoglobin 10.7 (*)    HCT 33.7 (*)    MCH 25.5 (*)    All other components within normal limits  I-STAT CG4 LACTIC ACID, ED -  Abnormal; Notable for the following:    Lactic Acid, Venous 0.38 (*)    All other components within normal limits  C DIFFICILE QUICK SCREEN W PCR REFLEX  URINALYSIS, ROUTINE W REFLEX MICROSCOPIC (NOT AT [facility])  I-STAT TROPOININ, ED  CBG MONITORING, ED  I-STAT CG4 LACTIC ACID, ED    Imaging Review Dg Abd Acute W/chest  12/02/2015  CLINICAL DATA:  Diarrhea for 1-1/2 weeks with nausea, chills, antibiotic usage, weight loss EXAM: DG ABDOMEN ACUTE W/ 1V CHEST COMPARISON:  03/06/2009 FINDINGS: Heart size and vascular pattern normal.Mild left lower lobe infiltrate or atelectasis. No free air in the abdomen. No enlarged bowel loops. Loop of bowel in mid pelvis shows possible wall thickening, likely sigmoid colon. Scattered small and large bowel air fluid levels. IMPRESSION: Air fluid levels suggest enterocolitis; possible mural inflammation distal colon. CT abdomen and pelvis recommended. Electronically Signed   By: Elodia Florence.D.  On: 12/02/2015 17:19   I have personally reviewed and evaluated these images and lab results as part of my medical decision-making.   EKG Interpretation   Date/Time:  Monday December 02 2015 16:12:29 EST Ventricular Rate:  68 PR Interval:  261 QRS Duration: 114 QT Interval:  426 QTC Calculation: 453 R Axis:   51 Text Interpretation:  Sinus rhythm Prolonged PR interval Borderline  intraventricular conduction delay Peaked t waves Confirmed by NGUYEN,  EMILY ([id number]) on 12/02/2015 4:47:46 PM      MDM  Patient seen and evaluated in stable condition.  Patient with severely elevated K+, Cr, BUN.  Low Bicarb.  Patient's EKG with peaked T waves.  Patient given Insulin and dextrose, Albuterol, calcium, bicarb.  Discussed with Dr. Roxy Horseman at [facility] who requested nephro consult here for evaluation for dialysis and stabilization.  Patient believes his left forearm fistula should be functioning.  Discussed with Dr. Florene Glen from nephrology who recommend 500 mg of  Solumedrol be given which was ordered.  He also requested patient be admitted to [facility] with plan for dialysis.  Discussed with Dr. Myna Hidalgo who agreed with admission and patient admitted to stepdown at [facility] with nephro following.  Patient updated on results and plan of care. Final diagnoses:  Acute renal failure, unspecified acute renal failure type (HCC)  Metabolic acidosis  Hyperkalemia    1. Acute kidney failure  2. Hyperkalemia  3. Metabolic acidosis    Harvel Quale, MD 12/02/15 [place], MD 12/02/15 [phone]

## 2015-12-02 NOTE — ED Notes (Signed)
Nurse is going to attempt to draw labs through pt IV site.

## 2015-12-02 NOTE — ED Notes (Signed)
Attempted to get urine sample, pt stated they did not have to use the bathroom at this time

## 2015-12-02 NOTE — H&P (Signed)
[facility] History and Physical  Tim Baker [id number] DOB: [birth year] DOA: 12/02/2015  Referring physician: ED physician PCP: No primary care provider on file.  Specialists: Dr. Marval Regal, nephrology   Chief Complaint: Diarrhea, sore throat, fatigue  HPI: Tim Baker is a 39 y.o. male with PMH of FSGS, and subsequent deceased donor renal transplant in 2013 at [facility] who presents to the ED with 1 week of diarrhea, sore throat and fatigue, and found to be in acute renal failure with critical hyperkalemia. Tim Baker was in his usual state of health until approximately one week ago when he developed watery diarrhea. Over the next couple of days. He developed a sore throat and became progressively fatigued with loss of appetite. He was evaluated for sore throat approximately 2 days ago and prescribed amoxicillin, which he has been taking without improvement. He denies abdominal pain, vomiting, or blood in his stool. There's been no recent long distance travel or sick contacts. Diarrhea began prior to the amoxicillin, but the patient is on chronic prophylactic therapy with Bactrim. He denies dysuria or any noticeable change in the color of his urine, but notes that his urine output is dropped off over the past week. He denies chest pain, cough, shortness of breath, or palpitations. He reports being compliant with his antirejection regimen and is been following with his nephrologist, Dr. Marval Regal, regularly.  In ED, patient was found to be afebrile and saturating well on room air. He was hypertensive but pulse and respiratory rates were stable. Initial blood work was notable for a potassium of greater than 7.5, with serum creatinine of 15.1, up from his baseline of approximately 2. EKG featured prolonged PR interval and peaked T waves and temporizing medications were given for the critical hyperkalemia. [facility] transplant service was contacted by the ED physician and  recommendations stabilizing the patient before any consideration of transfer to their facility. Dr. Florene Glen of nephrology was consulted from the emergency department and recommends transferred to [facility] with tentative plans for dialysis tonight.  Where does patient live?   At home   Can patient participate in ADLs?  Yes        Review of Systems:   General: no fevers, chills, or sweats, 10 pound weight loss, loss of appetite, fatigue  HEENT: no blurry vision, or hearing changes. Positive for sore throat Pulm: no dyspnea, cough, or wheeze CV: no chest pain or palpitations Abd: no nausea, or vomiting. Mild abdominal pain. Diarrhea  GU: no dysuria, or hematuria. Decreased urine output   Ext: no leg edema Neuro: no focal weakness, numbness, or tingling, no vision change or hearing loss Skin: no rash, no wounds MSK: No muscle spasm, no deformity, no red, hot, or swollen joint Heme: No easy bruising or bleeding Travel history: No recent long distant travel    Allergy: No Known Allergies  Past Medical History  Diagnosis Date  . Hypertension   . Renal disorder     Past Surgical History  Procedure Laterality Date  . Kidney transplant      2013    Social History:  reports that he has never smoked. He has never used smokeless tobacco. He reports that he drinks alcohol. He reports that he does not use illicit drugs.  Family History:  Family History  Problem Relation Age of Onset  . Hypertension Father      Prior to Admission medications   Medication Sig Start Date End Date Taking? Authorizing Provider  amLODipine (NORVASC)  10 MG tablet Take 10 mg by mouth daily.   Yes Historical Provider, MD  amoxicillin (AMOXIL) 875 MG tablet Take 875 mg by mouth 2 (two) times daily.   Yes Historical Provider, MD  guaiFENesin (ROBITUSSIN) 100 MG/5ML SOLN Take 5 mLs by mouth every 4 (four) hours as needed for cough or to loosen phlegm.   Yes Historical Provider, MD  labetalol (NORMODYNE) 300 MG  tablet Take 300 mg by mouth 2 (two) times daily.   Yes Historical Provider, MD  magic mouthwash w/lidocaine SOLN Take 5 mLs by mouth 4 (four) times daily. Swish,gargle, and spit   Yes Historical Provider, MD  mycophenolate (MYFORTIC) 180 MG EC tablet Take 540 mg by mouth 2 (two) times daily.   Yes Historical Provider, MD  predniSONE (DELTASONE) 5 MG tablet Take 5 mg by mouth daily. 09/09/15  Yes Historical Provider, MD  sulfamethoxazole-trimethoprim (BACTRIM DS,SEPTRA DS) 800-160 MG tablet Take 1 tablet by mouth 3 (three) times a week. Every Monday, Wednesday, and Friday   Yes Historical Provider, MD  tacrolimus (PROGRAF) 1 MG capsule Take 1 mg by mouth 2 (two) times daily.   Yes Historical Provider, MD    Physical Exam: Filed Vitals:   12/02/15 2135 12/02/15 2200 12/02/15 2230 12/03/15 0141  BP: 162/98 162/96 161/86   Pulse: 59 61 58   Temp:      TempSrc:      Resp: 18 18 18    Height:      Weight:    116 kg (255 lb 11.7 oz)  SpO2:       General: Not in acute distress, diaphoretic HEENT:       Eyes: PERRL, EOMI, no scleral icterus or conjunctival pallor.       ENT: No discharge from the ears or nose, no pharyngeal ulcers, petechiae or exudate.        Neck: No JVD, no bruit, no appreciable mass Heme: No cervical adenopathy, no pallor Cardiac: [id number], grade 2 systolic ejection murmur, No gallops or rubs. Pulm: Good air movement bilaterally. No rales, wheezing, rhonchi or rubs. Abd: Soft, nondistended, mildly tender in right lower quadrant at transplant kidney site, no rebound pain or gaurding, no mass or organomegaly, BS present. Ext: No LE edema bilaterally. 2+DP/PT pulse bilaterally. Musculoskeletal: No gross deformity, no red, hot, swollen joints, no limitation in ROM  Skin: No rashes or wounds on exposed surfaces  Neuro: Alert, oriented X3, cranial nerves II-XII grossly intact, muscle strength 5/5 in all extremities, sensation to light touch intact. No focal findings Psych: Patient  is not overtly psychotic.  Labs on Admission:  Basic Metabolic Panel:  Recent Labs Lab 12/02/15 1557 12/02/15 2131  NA 130* 133*  K >7.5* >7.5*  CL 97* 99*  CO2 9* 8*  GLUCOSE 94 60*  BUN 85* 178*  CREATININE 15.10* 30.74*  CALCIUM 8.3* 8.6*   Liver Function Tests:  Recent Labs Lab 12/02/15 1557 12/02/15 2131  AST 8* 11*  ALT 19 19  ALKPHOS 66 70  BILITOT 0.2* 0.6  PROT 8.3* 7.9  ALBUMIN 3.3* 3.0*    Recent Labs Lab 12/02/15 1557  LIPASE 68*   No results for input(s): AMMONIA in the last 168 hours. CBC:  Recent Labs Lab 12/02/15 1557 12/02/15 2131  WBC 6.9 9.2  HGB 10.7* 10.8*  HCT 33.7* 33.0*  MCV 80.2 79.5  PLT 379 392   Cardiac Enzymes:  Recent Labs Lab 12/02/15 2131  TROPONINI <0.03    BNP (last 3 results) No  results for input(s): BNP in the last 8760 hours.  ProBNP (last 3 results) No results for input(s): PROBNP in the last 8760 hours.  CBG:  Recent Labs Lab 12/02/15 1729  GLUCAP 81    Radiological Exams on Admission: Dg Abd Acute W/chest  12/02/2015  CLINICAL DATA:  Diarrhea for 1-1/2 weeks with nausea, chills, antibiotic usage, weight loss EXAM: DG ABDOMEN ACUTE W/ 1V CHEST COMPARISON:  03/06/2009 FINDINGS: Heart size and vascular pattern normal.Mild left lower lobe infiltrate or atelectasis. No free air in the abdomen. No enlarged bowel loops. Loop of bowel in mid pelvis shows possible wall thickening, likely sigmoid colon. Scattered small and large bowel air fluid levels. IMPRESSION: Air fluid levels suggest enterocolitis; possible mural inflammation distal colon. CT abdomen and pelvis recommended. Electronically Signed   By: Skipper Cliche M.D.   On: 12/02/2015 17:19    EKG: Independently reviewed.  Abnormal findings:     Sinus rhythm with peaked T waves and PR interval prolonged to 261  Assessment/Plan Active Problems:   Metabolic acidosis   Acute renal failure (ARF) ([place])   1. 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 Marval Regal of nephrology - Baseline creatinine reportedly 2.0, now 15 on admission - Uncertain at this time whether this is a prerenal azotemia in the setting of severe diarrhea, with acute rejection maybe more likely - Dr. Florene Glen of nephrology was consulted from the emergency department and recommends 500 mg IV Solu-Medrol and transfer to [facility] for dialysis - Patient has left upper extremity AV graft with bruit and thrill, not used since transplant  2. Critical hyperkalemia - Potassium greater than 7.5 on admission with EKG changes, specifically prolonged PR interval and peaked T waves - Temporizing measures were given in the emergency department with calcium, D5/insulin, sodium bicarbonate, and albuterol - Kayexalate ordered - Monitor on continuous telemetry  - Follow serial BMP until dialysis - IV fluid hydration - Repeat temporizing measures as needed on the waiting HD    3. Diarrhea - Developed about one week ago, before the patient started amoxicillin for sore throat - Described as watery, patient denies blood - No sick contacts or recent travel - Suspect viral enterocolitis, C. difficile PCR pending - IV fluid hydration, monitor electrolytes  4. Normocytic anemia - Chronic, likely secondary to renal disease  - Will trend  5. Hypertension, chronic - Hypertensive currently to 180s/80s - Will hold scheduled antihypertensives pending dialysis, with IV pushes when necessary     DVT ppx: SQ Heparin    Code Status: Full code Family Communication: None at bed side.      Disposition Plan: Admit to inpatient   Date of Service 12/03/2015    Vianne Bulls, MD [facility] Pager [phone]  If 7PM-7AM, please contact night-coverage [email or web address] Password [email or web address] 12/03/2015, 2:03 AM

## 2015-12-02 NOTE — ED Notes (Addendum)
Patient presents to ED from home with c/o diarrhea x1.5 weeks.  Patient endorses nausea, but denies vomiting.  Patient endorses chills, but has not measured temperature at home.  Patient was prescribed Amoxicillin by an Urgent Care last week for "something to do with [his] throat," but he is uncertain of the diagnosis.  Patient also c/o neck pain.  Patient states he has lost 10 lbs in the last week and a half due to diarrhea.  Patient denies nasal congestion and cough.  Patient denies SOB, but is dyspneic when speaking for extended periods of time.  General: Awake, alert, well-nourished, NAD HEENT: Normal      Throat: MMM, uvula mildly inflamed Cardio: RRR, S1S2 heard, no murmurs appreciated, no LE edema, cyanosis or clubbing; +2 radial pulses bilaterally Pulm: CTAB, no wheezes, rhonchi or rales GI: Soft, non-tender, mildly distended.  +BS x4, no hepatomegaly, no splenomegaly Extremities: MAE

## 2015-12-03 ENCOUNTER — Encounter (HOSPITAL_COMMUNITY): Payer: Self-pay | Admitting: Family Medicine

## 2015-12-03 DIAGNOSIS — I10 Essential (primary) hypertension: Secondary | ICD-10-CM | POA: Diagnosis present

## 2015-12-03 DIAGNOSIS — Z940 Kidney transplant status: Secondary | ICD-10-CM

## 2015-12-03 DIAGNOSIS — E872 Acidosis: Secondary | ICD-10-CM

## 2015-12-03 DIAGNOSIS — D631 Anemia in chronic kidney disease: Secondary | ICD-10-CM

## 2015-12-03 DIAGNOSIS — N189 Chronic kidney disease, unspecified: Secondary | ICD-10-CM

## 2015-12-03 DIAGNOSIS — N179 Acute kidney failure, unspecified: Secondary | ICD-10-CM | POA: Insufficient documentation

## 2015-12-03 DIAGNOSIS — E875 Hyperkalemia: Secondary | ICD-10-CM

## 2015-12-03 LAB — C DIFFICILE QUICK SCREEN W PCR REFLEX
C Diff antigen: NEGATIVE
C Diff interpretation: NEGATIVE
C Diff toxin: NEGATIVE

## 2015-12-03 LAB — BASIC METABOLIC PANEL
Anion gap: 23 — ABNORMAL HIGH (ref 5–15)
BUN: 106 mg/dL — ABNORMAL HIGH (ref 6–20)
CO2: 15 mmol/L — ABNORMAL LOW (ref 22–32)
Calcium: 8.1 mg/dL — ABNORMAL LOW (ref 8.9–10.3)
Chloride: 99 mmol/L — ABNORMAL LOW (ref 101–111)
Creatinine, Ser: 19.54 mg/dL — ABNORMAL HIGH (ref 0.61–1.24)
GFR calc Af Amer: 3 mL/min — ABNORMAL LOW (ref >60)
GFR calc non Af Amer: 3 mL/min — ABNORMAL LOW (ref >60)
Glucose, Bld: 73 mg/dL (ref 65–99)
Potassium: 4 mmol/L (ref 3.5–5.1)
Sodium: 137 mmol/L (ref 135–145)

## 2015-12-03 LAB — TROPONIN I
Troponin I: <0.03 ng/mL (ref <0.031)
Troponin I: <0.03 ng/mL (ref <0.031)

## 2015-12-03 LAB — HEPATITIS B SURFACE ANTIGEN: Hepatitis B Surface Ag: NEGATIVE

## 2015-12-03 MED ORDER — SODIUM CHLORIDE 0.9 % IV SOLN
1000.0000 mg | INTRAVENOUS | Status: DC
  Administered 2015-12-03: 1000 mg via INTRAVENOUS
  Filled 2015-12-03: qty 8

## 2015-12-03 MED ORDER — SODIUM CHLORIDE 0.9 % IV SOLN: 1000.0000 mg | INTRAVENOUS | Status: DC

## 2015-12-03 NOTE — Progress Notes (Signed)
S: Feels better.  Feels Like eating O:BP 171/91 mmHg  Pulse 76  Temp(Src) 97.7 F (36.5 C) (Oral)  Resp 16  Ht 6' (1.829 m)  Wt 116 kg (255 lb 11.7 oz)  BMI 34.68 kg/m2  SpO2 96%  Intake/Output Summary (Last 24 hours) at 12/03/15 0848 Last data filed at 12/03/15 0118  Gross per 24 hour  Intake     50 ml  Output   1000 ml  Net   -950 ml   Weight change:  [id number] and alert CVS: RRR Resp:clear Abd:+ BS NTND  Tx RLQ nontender Ext: 0-tr edema.  Lt AVF + bruit NEURO:CNI Ox3, No asterixis   . amLODipine  10 mg Oral Daily  . heparin  5,000 Units Subcutaneous 3 times per day  . labetalol  300 mg Oral BID  . magic mouthwash w/lidocaine  5 mL Oral QID  . methylPREDNISolone (SOLU-MEDROL) injection  1,000 mg Intravenous Daily  . mycophenolate  540 mg Oral BID  . sodium chloride  3 mL Intravenous Q12H  . sodium chloride  3 mL Intravenous Q12H  . sulfamethoxazole-trimethoprim  1 tablet Oral Once per day on Mon Wed Fri  . tacrolimus  3 mg Oral Q2200  . [START ON 12/04/2015] tacrolimus  4 mg Oral q morning - 10a   Dg Abd Acute W/chest  12/02/2015  CLINICAL DATA:  Diarrhea for 1-1/2 weeks with nausea, chills, antibiotic usage, weight loss EXAM: DG ABDOMEN ACUTE W/ 1V CHEST COMPARISON:  03/06/2009 FINDINGS: Heart size and vascular pattern normal.Mild left lower lobe infiltrate or atelectasis. No free air in the abdomen. No enlarged bowel loops. Loop of bowel in mid pelvis shows possible wall thickening, likely sigmoid colon. Scattered small and large bowel air fluid levels. IMPRESSION: Air fluid levels suggest enterocolitis; possible mural inflammation distal colon. CT abdomen and pelvis recommended. Electronically Signed   By: Skipper Cliche M.D.   On: 12/02/2015 17:19   BMET    Component Value Date/Time   NA 137 12/03/2015 0300   K 4.0 12/03/2015 0300   CL 99* 12/03/2015 0300   CO2 15* 12/03/2015 0300   GLUCOSE 73 12/03/2015 0300   BUN 106* 12/03/2015 0300   CREATININE 19.54*  12/03/2015 0300   CALCIUM 8.1* 12/03/2015 0300   GFRNONAA 3* 12/03/2015 0300   GFRAA 3* 12/03/2015 0300   CBC    Component Value Date/Time   WBC 9.2 12/02/2015 2131   RBC 4.15* 12/02/2015 2131   HGB 10.8* 12/02/2015 2131   HCT 33.0* 12/02/2015 2131   PLT 392 12/02/2015 2131   MCV 79.5 12/02/2015 2131   MCH 26.0 12/02/2015 2131   MCHC 32.7 12/02/2015 2131   RDW 14.0 12/02/2015 2131     Assessment: 1. 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 Hyperkalemia, resolved 3. HTN   Plan: 1.  I have spoken to a Network engineer at [facility] who will let me know about transfer 2.  I don't think he needs a CT of ABD and will cancel 3.  If not accepted by [facility] today then may plan HD again later today   Daryan Cagley T

## 2015-12-03 NOTE — Progress Notes (Signed)
[facility] PROGRESS NOTE  ASAI Tim Baker [id number] DOB: September 21, 1976 DOA: 12/02/2015 PCP: No primary care provider on file.  Assessment/Plan:  #1 acute renal failure  Secondary to  Transplant kidney rejection  Secondary to stopping immunosuppression the patient for about a week secondary to  Insurance refusing to pay for his immunosuppressive medications. Patient status post emergent hemodialysis last night. Patient with some clinical improvement. Continue high-dose IV Solu-Medrol.  Continue Myfortic,  Bactrim DS, Prograf. Nephrology following and coordinating with [facility] about  Transfer. Per nephrology.   #2 hyperkalemia  Patient noted to have significant hyperkalemia with potassium greater than 7.5 an EKG changes. Status post emergent hemodialysis last night. Hyperkalemia resolved.  Per nephrology. Follow.  #3  Hypertension  Continue Norvasc and labetalol.   #4  Diarrhea  Likely secondary to enteritis versus problem #1. Patient with no further loose stools. C. Difficile PCR was negative. Supportive care.   #5  Anemia of chronic disease  Likely secondary to renal disease. Stable. Follow.    #6 metabolic acidosis  secondary to problem #1. Improved with hemodialysis.  Per renal. Follow.  #7 prophylaxis  Heparin for DVT prophylaxis.  Code Status:  full Family Communication:  Updated patient. No family at bedside. Disposition Plan:  Remain in the step down unit for now. Hopefully to transfer to [facility]   To nephrology transplant team per nephrology.   Consultants:   nephrology: Dr. Florene Glen 12/02/2015  Procedures:   acute abdominal series 12/02/2015    Antibiotics:   none  HPI/Subjective:  Patient states he's feeling better. No abdominal pain. No further diarrhea. No nausea. No vomiting. Asking to eat.  Objective: Filed Vitals:   12/03/15 0700 12/03/15 0800  BP: 163/88 171/91  Pulse: 76 76  Temp:  98 F (36.7 C)  Resp:  17 16    Intake/Output Summary (Last 24 hours) at 12/03/15 1020 Last data filed at 12/03/15 0118  Gross per 24 hour  Intake     50 ml  Output   1000 ml  Net   -950 ml   Filed Weights   12/02/15 2100 12/02/15 2120 12/03/15 0141  Weight: 116.6 kg (257 lb 0.9 oz) 116.4 kg (256 lb 9.9 oz) 116 kg (255 lb 11.7 oz)    Exam:   General:  NAD  Cardiovascular: RRR  Respiratory: CTAB  Abdomen: Soft/mildly/ distended/NT/+BS  Musculoskeletal: No c/c/e  Data Reviewed: Basic Metabolic Panel:  Recent Labs Lab 12/02/15 1557 12/02/15 2131 12/03/15 0300  NA 130* 133* 137  K >7.5* >7.5* 4.0  CL 97* 99* 99*  CO2 9* 8* 15*  GLUCOSE 94 60* 73  BUN 85* 178* 106*  CREATININE 15.10* 30.74* 19.54*  CALCIUM 8.3* 8.6* 8.1*   Liver Function Tests:  Recent Labs Lab 12/02/15 1557 12/02/15 2131  AST 8* 11*  ALT 19 19  ALKPHOS 66 70  BILITOT 0.2* 0.6  PROT 8.3* 7.9  ALBUMIN 3.3* 3.0*    Recent Labs Lab 12/02/15 1557  LIPASE 68*   No results for input(s): AMMONIA in the last 168 hours. CBC:  Recent Labs Lab 12/02/15 1557 12/02/15 2131  WBC 6.9 9.2  HGB 10.7* 10.8*  HCT 33.7* 33.0*  MCV 80.2 79.5  PLT 379 392   Cardiac Enzymes:  Recent Labs Lab 12/02/15 2131 12/03/15 0300  TROPONINI <0.03 <0.03   BNP (last 3 results) No results for input(s): BNP in the last 8760 hours.  ProBNP (last 3 results) No results for input(s): PROBNP  in the last 8760 hours.  CBG:  Recent Labs Lab 12/02/15 1729  GLUCAP 81    Recent Results (from the past 240 hour(s))  MRSA PCR Screening     Status: None   Collection Time: 12/02/15  9:05 PM  Result Value Ref Range Status   MRSA by PCR NEGATIVE NEGATIVE Final    Comment:        The GeneXpert MRSA Assay (FDA approved for NASAL specimens only), is one component of a comprehensive MRSA colonization surveillance program. It is not intended to diagnose MRSA infection nor to guide or monitor treatment for MRSA infections.    C difficile quick scan w PCR reflex     Status: None   Collection Time: 12/03/15  5:58 AM  Result Value Ref Range Status   C Diff antigen NEGATIVE NEGATIVE Final   C Diff toxin NEGATIVE NEGATIVE Final   C Diff interpretation Negative for toxigenic C. difficile  Final     Studies: Dg Abd Acute W/chest  12/02/2015  CLINICAL DATA:  Diarrhea for 1-1/2 weeks with nausea, chills, antibiotic usage, weight loss EXAM: DG ABDOMEN ACUTE W/ 1V CHEST COMPARISON:  03/06/2009 FINDINGS: Heart size and vascular pattern normal.Mild left lower lobe infiltrate or atelectasis. No free air in the abdomen. No enlarged bowel loops. Loop of bowel in mid pelvis shows possible wall thickening, likely sigmoid colon. Scattered small and large bowel air fluid levels. IMPRESSION: Air fluid levels suggest enterocolitis; possible mural inflammation distal colon. CT abdomen and pelvis recommended. Electronically Signed   By: Skipper Cliche M.D.   On: 12/02/2015 17:19    Scheduled Meds: . amLODipine  10 mg Oral Daily  . heparin  5,000 Units Subcutaneous 3 times per day  . labetalol  300 mg Oral BID  . magic mouthwash w/lidocaine  5 mL Oral QID  . methylPREDNISolone (SOLU-MEDROL) injection  1,000 mg Intravenous Daily  . mycophenolate  540 mg Oral BID  . sodium chloride  3 mL Intravenous Q12H  . sodium chloride  3 mL Intravenous Q12H  . sulfamethoxazole-trimethoprim  1 tablet Oral Once per day on Mon Wed Fri  . tacrolimus  3 mg Oral Q2200  . [START ON 12/04/2015] tacrolimus  4 mg Oral q morning - 10a   Continuous Infusions:   Principal Problem:   Acute renal failure (ARF) ([facility]) Active Problems:   Hyperkalemia   Metabolic acidosis   Renal transplant recipient   Accelerated hypertension   Anemia in chronic kidney disease    Time spent: 107 mins    [facility] [place] MD [facility] Pager [phone]. If 7PM-7AM, please contact night-coverage at [email or web address], password [email or web address] 12/03/2015, 10:20 AM  LOS: 1 day

## 2015-12-03 NOTE — Evaluation (Signed)
Physical Therapy Evaluation Patient Details Name: Tim Baker MRN: [id number] DOB: 01-12-1976 Today's Date: 12/03/2015   History of Present Illness  Tim Baker is a 39 y.o. male with PMH of FSGS, and subsequent deceased donor renal transplant in 2013 at [facility] who presents to the ED with 1 week of diarrhea, sore throat and fatigue, and found to be in acute renal failure with critical hyperkalemia.   Clinical Impression  Patient presents with decreased mobility due to deficits listed in PT problem list.  He will benefit from skilled PT in the acute setting to allow return home independent at d/c,    Follow Up Recommendations No PT follow up    Equipment Recommendations  None recommended by PT    Recommendations for Other Services       Precautions / Restrictions Precautions Precautions: Fall      Mobility  Bed Mobility Overal bed mobility: Needs Assistance Bed Mobility: Supine to Sit     Supine to sit: Min guard     General bed mobility comments: due to lines, etc, use of rail  Transfers Overall transfer level: Needs assistance   Transfers: Sit to/from Stand Sit to Stand: Min guard         General transfer comment: steadying assist for balance, and lines  Ambulation/Gait Ambulation/Gait assistance: Supervision;Min guard Ambulation Distance (Feet): 300 Feet Assistive device: None Gait Pattern/deviations: Step-through pattern;Wide base of support;Decreased stride length Gait velocity: slower than normal for age   General Gait Details: increased lateral sway  Stairs            Wheelchair Mobility    Modified Rankin (Stroke Patients Only)       Balance Overall balance assessment: Needs assistance   Sitting balance-Leahy Scale: Good       Standing balance-Leahy Scale: Fair                               Pertinent Vitals/Pain Pain Assessment: No/denies pain    Home Living Family/patient expects to be  discharged to:: Private residence Living Arrangements: Alone   Type of Home: House Home Access: Stairs to enter Entrance Stairs-Rails: Can reach both Entrance Stairs-Number of Steps: 3 Home Layout: One level Home Equipment: None      Prior Function Level of Independence: Independent         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, newspaper        Extremity/Trunk Assessment               Lower Extremity Assessment: Overall WFL for tasks assessed         Communication   Communication: No difficulties  Cognition Arousal/Alertness: Awake/alert Behavior During Therapy: Flat affect Overall Cognitive Status: Within Functional Limits for tasks assessed                      General Comments      Exercises        Assessment/Plan    PT Assessment Patient needs continued PT services  PT Diagnosis Abnormality of gait;Generalized weakness   PT Problem List Decreased strength;Decreased activity tolerance;Decreased balance;Decreased mobility  PT Treatment Interventions Gait training;Balance training;Stair training;Functional mobility training;Patient/family education;Therapeutic activities;Therapeutic exercise   PT Goals (Current goals can be found in the Care Plan section) Acute Rehab PT Goals Patient Stated Goal: To return to normal PT Goal Formulation: With patient Time For Goal Achievement:  12/10/15 Potential to Achieve Goals: Good    Frequency Min 3X/week   Barriers to discharge        Co-evaluation               End of Session Equipment Utilized During Treatment: Gait belt Activity Tolerance: Patient tolerated treatment well Patient left: in bed;with call bell/phone within reach           Time: 1337-1400 PT Time Calculation (min) (ACUTE ONLY): 23 min   Charges:   PT Evaluation $Initial PT Evaluation Tier I: 1 Procedure PT Treatments $Gait Training: 8-22 mins   PT G CodesReginia Naas 2015/12/14, 2:54  PM  Magda Kiel, [place] 12/14/2015

## 2015-12-03 NOTE — Discharge Summary (Signed)
Physician Discharge Summary  Tim Baker [id number] DOB: February 27, 1976 DOA: 12/02/2015  PCP: No primary care provider on file.  Admit date: 12/02/2015 Discharge date: 12/03/2015  Time spent: 60 minutes  Recommendations for Outpatient Follow-up:  1. Patient will be discharged/transferred to [facility] to the nephrology service under Tim Baker.   Discharge Diagnoses:  Principal Problem:   Acute renal failure (ARF) ([place]) Active Problems:   Hyperkalemia   Metabolic acidosis   Renal transplant recipient   Accelerated hypertension   Anemia in chronic kidney disease   Acute renal failure [facility])   Discharge Condition: Stable   Diet recommendation: Clear liquid diet  Filed Weights   12/02/15 2100 12/02/15 2120 12/03/15 0141  Weight: 116.6 kg (257 lb 0.9 oz) 116.4 kg (256 lb 9.9 oz) 116 kg (255 lb 11.7 oz)    History of present illness:  Per Dr Tim Baker is a 39 y.o. male with PMH of FSGS, and subsequent deceased donor renal transplant in 2013 at [facility] who presented to the ED with 1 week of diarrhea, sore throat and fatigue, and found to be in acute renal failure with critical hyperkalemia. Tim Baker was in his usual state of health until approximately one week ago when he developed watery diarrhea. Over the next couple of days. He developed a sore throat and became progressively fatigued with loss of appetite. He was evaluated for sore throat approximately 2 days ago and prescribed amoxicillin, which he had been taking without improvement. He denied abdominal pain, vomiting, or blood in his stool. There's been no recent long distance travel or sick contacts. Diarrhea began prior to the amoxicillin, but the patient is on chronic prophylactic therapy with Bactrim. He denied dysuria or any noticeable change in the color of his urine, but notes that his urine output is dropped off over the past week. He denied chest pain, cough,  shortness of breath, or palpitations. He reported being compliant with his antirejection regimen and has been following with his nephrologist, Tim Baker, regularly.  In ED, patient was found to be afebrile and saturating well on room air. He was hypertensive but pulse and respiratory rates were stable. Initial blood work was notable for a potassium of greater than 7.5, with serum creatinine of 15.1, up from his baseline of approximately 2. EKG featured prolonged PR interval and peaked T waves and temporizing medications were given for the critical hyperkalemia. [facility] transplant service was contacted by the ED physician and recommended stabilizing the patient before any consideration of transfer to their facility. Tim Baker of nephrology was consulted from the emergency department and recommended transferred to [facility] with tentative plans for dialysis on the night of admission.   Hospital Course:  #1 acute renal failure Secondary to Transplant kidney rejection Secondary to stopping immunosuppression medications per the patient for about a week secondary to Insurance refusing to pay for his immunosuppressive medications. Patient was admitted nephrology consulted and it was felt patient had rejection to his transplanted kidney. Patient underwent emergent hemodialysis, was placed on high-dose IV Solu-Medrol 1 g daily and his antirejection medications of Myfortic, Prograft were resumed. Patient was also resumed on Bactrim DS. Nephrology consulted with Tim Baker of neurology and [facility] and decision was made to transfer patient to be a service for further evaluation and management of probable rejection of transplanted kidney. Patient will be discharged to [facility] when a bed is  available.   #2 hyperkalemia Patient noted to have significant hyperkalemia with potassium greater than 7.5 and EKG changes. Patient was seen in  consultation by nephrology and patient underwent emergent hemodialysis on the night of admission 12/02/2015. Patient's hyperkalemia resolved.   #3 Hypertension Patient was maintained on his home regimen of Norvasc and labetalol.   #4 Diarrhea Likely secondary to enteritis versus problem #1. Patient with no further loose stools. C. Difficile PCR was negative. Patient was maintained on supportive care.  #5 Anemia of chronic disease Likely secondary to renal disease. Stable.  #6 metabolic acidosis secondary to problem #1. Improved with emergent hemodialysis.   Procedures:  acute abdominal series 12/02/2015  Emergent hemodialysis 12/02/2015  Consultations:  nephrology: Tim Baker 12/02/2015  Discharge Exam: Filed Vitals:   12/03/15 1100 12/03/15 1200  BP: 177/95 167/90  Pulse: 76 77  Temp:  97.7 F (36.5 C)  Resp: 18 20    General: NAD Cardiovascular: RRR Respiratory: CTAB anterior lung fields.  Discharge Instructions   Discharge Instructions    Increase activity slowly    Complete by:  As directed           Current Discharge Medication List    START taking these medications   Details  methylPREDNISolone sodium succinate 1,000 mg in sodium chloride 0.9 % 50 mL Inject 1,000 mg into the vein daily.      CONTINUE these medications which have NOT CHANGED   Details  amLODipine (NORVASC) 10 MG tablet Take 10 mg by mouth daily.    amoxicillin (AMOXIL) 875 MG tablet Take 875 mg by mouth 2 (two) times daily.    guaiFENesin (ROBITUSSIN) 100 MG/5ML SOLN Take 5 mLs by mouth every 4 (four) hours as needed for cough or to loosen phlegm.    labetalol (NORMODYNE) 300 MG tablet Take 300 mg by mouth 2 (two) times daily.    magic mouthwash w/lidocaine SOLN Take 5 mLs by mouth 4 (four) times daily. Swish,gargle, and spit    mycophenolate (MYFORTIC) 180 MG EC tablet Take 540 mg by mouth 2 (two) times daily.    sulfamethoxazole-trimethoprim (BACTRIM DS,SEPTRA DS)  800-160 MG tablet Take 1 tablet by mouth 3 (three) times a week. Every Monday, Wednesday, and Friday    tacrolimus (PROGRAF) 1 MG capsule Take 1 mg by mouth 2 (two) times daily.      STOP taking these medications     predniSONE (DELTASONE) 5 MG tablet        No Known Allergies    The results of significant diagnostics from this hospitalization (including imaging, microbiology, ancillary and laboratory) are listed below for reference.    Significant Diagnostic Studies: Dg Abd Acute W/chest  12/02/2015  CLINICAL DATA:  Diarrhea for 1-1/2 weeks with nausea, chills, antibiotic usage, weight loss EXAM: DG ABDOMEN ACUTE W/ 1V CHEST COMPARISON:  03/06/2009 FINDINGS: Heart size and vascular pattern normal.Mild left lower lobe infiltrate or atelectasis. No free air in the abdomen. No enlarged bowel loops. Loop of bowel in mid pelvis shows possible wall thickening, likely sigmoid colon. Scattered small and large bowel air fluid levels. IMPRESSION: Air fluid levels suggest enterocolitis; possible mural inflammation distal colon. CT abdomen and pelvis recommended. Electronically Signed   By: Skipper Cliche M.D.   On: 12/02/2015 17:19    Microbiology: Recent Results (from the past 240 hour(s))  MRSA PCR Screening     Status: None   Collection Time: 12/02/15  9:05 PM  Result Value Ref Range Status   MRSA by  PCR NEGATIVE NEGATIVE Final    Comment:        The GeneXpert MRSA Assay (FDA approved for NASAL specimens only), is one component of a comprehensive MRSA colonization surveillance program. It is not intended to diagnose MRSA infection nor to guide or monitor treatment for MRSA infections.   C difficile quick scan w PCR reflex     Status: None   Collection Time: 12/03/15  5:58 AM  Result Value Ref Range Status   C Diff antigen NEGATIVE NEGATIVE Final   C Diff toxin NEGATIVE NEGATIVE Final   C Diff interpretation Negative for toxigenic C. difficile  Final     Labs: Basic  Metabolic Panel:  Recent Labs Lab 12/02/15 1557 12/02/15 2131 12/03/15 0300  NA 130* 133* 137  K >7.5* >7.5* 4.0  CL 97* 99* 99*  CO2 9* 8* 15*  GLUCOSE 94 60* 73  BUN 85* 178* 106*  CREATININE 15.10* 30.74* 19.54*  CALCIUM 8.3* 8.6* 8.1*   Liver Function Tests:  Recent Labs Lab 12/02/15 1557 12/02/15 2131  AST 8* 11*  ALT 19 19  ALKPHOS 66 70  BILITOT 0.2* 0.6  PROT 8.3* 7.9  ALBUMIN 3.3* 3.0*    Recent Labs Lab 12/02/15 1557  LIPASE 68*   No results for input(s): AMMONIA in the last 168 hours. CBC:  Recent Labs Lab 12/02/15 1557 12/02/15 2131  WBC 6.9 9.2  HGB 10.7* 10.8*  HCT 33.7* 33.0*  MCV 80.2 79.5  PLT 379 392   Cardiac Enzymes:  Recent Labs Lab 12/02/15 2131 12/03/15 0300 12/03/15 1101  TROPONINI <0.03 <0.03 <0.03   BNP: BNP (last 3 results) No results for input(s): BNP in the last 8760 hours.  ProBNP (last 3 results) No results for input(s): PROBNP in the last 8760 hours.  CBG:  Recent Labs Lab 12/02/15 1729  GLUCAP 81       Signed:  THOMPSON,DANIEL MD [facility] 12/03/2015, 2:38 PM

## 2015-12-03 NOTE — Care Management Note (Signed)
Case Management Note  Patient Details  Name: Tim Baker MRN: [id number] Date of Birth: [birth year]  Subjective/Objective:      Adm w hyperkalemia              Action/Plan:lives at home   Expected Discharge Date:                  Expected Discharge Plan:     In-House Referral:     Discharge planning Services     Post Acute Care Choice:    Choice offered to:     DME Arranged:    DME Agency:     HH Arranged:    [place] Agency:     Status of Service:     Medicare Important Message Given:    Date Medicare IM Given:    Medicare IM give by:    Date Additional Medicare IM Given:    Additional Medicare Important Message give by:     If discussed at [place] of Stay Meetings, dates discussed:    Additional Comments: ur review done  Lacretia Leigh, RN 12/03/2015, 8:57 AM

## 2015-12-03 NOTE — Progress Notes (Addendum)
RN called abnormal labs to this NP. Potassium and creat elevated but pt is having HD when labs drawn. High K level was addressed on admission. R/p in am.

## 2015-12-04 LAB — COMPREHENSIVE METABOLIC PANEL
ALT: 19 U/L (ref 17–63)
AST: 11 U/L — ABNORMAL LOW (ref 15–41)
Albumin: 3 g/dL — ABNORMAL LOW (ref 3.5–5.0)
Alkaline Phosphatase: 70 U/L (ref 38–126)
Anion gap: 26 — ABNORMAL HIGH (ref 5–15)
BUN: 178 mg/dL — ABNORMAL HIGH (ref 6–20)
CO2: 8 mmol/L — ABNORMAL LOW (ref 22–32)
Calcium: 8.6 mg/dL — ABNORMAL LOW (ref 8.9–10.3)
Chloride: 99 mmol/L — ABNORMAL LOW (ref 101–111)
Creatinine, Ser: 30.74 mg/dL — ABNORMAL HIGH (ref 0.61–1.24)
GFR calc Af Amer: 2 mL/min — ABNORMAL LOW (ref >60)
GFR calc non Af Amer: 1 mL/min — ABNORMAL LOW (ref >60)
Glucose, Bld: 60 mg/dL — ABNORMAL LOW (ref 65–99)
Potassium: >7.5 mmol/L (ref 3.5–5.1)
Sodium: 133 mmol/L — ABNORMAL LOW (ref 135–145)
Total Bilirubin: 0.6 mg/dL (ref 0.3–1.2)
Total Protein: 7.9 g/dL (ref 6.5–8.1)

## 2015-12-04 LAB — HEPATITIS B CORE ANTIBODY, TOTAL: Hep B Core Total Ab: NEGATIVE

## 2015-12-04 LAB — HEPATITIS B SURFACE ANTIBODY,QUALITATIVE: Hep B S Ab: REACTIVE

## 2015-12-31 ENCOUNTER — Encounter (HOSPITAL_COMMUNITY): Payer: Self-pay

## 2015-12-31 ENCOUNTER — Inpatient Hospital Stay (HOSPITAL_COMMUNITY)
Admission: EM | Admit: 2015-12-31 | Discharge: 2016-01-04 | DRG: 299 | Disposition: A | Discharge status: Home / Self Care | Payer: Medicaid Other | Attending: Internal Medicine | Admitting: Internal Medicine

## 2015-12-31 DIAGNOSIS — E669 Obesity, unspecified: Secondary | ICD-10-CM | POA: Diagnosis present

## 2015-12-31 DIAGNOSIS — Z992 Dependence on renal dialysis: Secondary | ICD-10-CM | POA: Diagnosis not present

## 2015-12-31 DIAGNOSIS — K921 Melena: Secondary | ICD-10-CM | POA: Diagnosis present

## 2015-12-31 DIAGNOSIS — I120 Hypertensive chronic kidney disease with stage 5 chronic kidney disease or end stage renal disease: Secondary | ICD-10-CM | POA: Diagnosis present

## 2015-12-31 DIAGNOSIS — Y830 Surgical operation with transplant of whole organ as the cause of abnormal reaction of the patient, or of later complication, without mention of misadventure at the time of the procedure: Secondary | ICD-10-CM | POA: Diagnosis present

## 2015-12-31 DIAGNOSIS — Z79899 Other long term (current) drug therapy: Secondary | ICD-10-CM | POA: Diagnosis not present

## 2015-12-31 DIAGNOSIS — K2980 Duodenitis without bleeding: Secondary | ICD-10-CM | POA: Diagnosis present

## 2015-12-31 DIAGNOSIS — N186 End stage renal disease: Secondary | ICD-10-CM | POA: Diagnosis present

## 2015-12-31 DIAGNOSIS — K922 Gastrointestinal hemorrhage, unspecified: Secondary | ICD-10-CM

## 2015-12-31 DIAGNOSIS — D62 Acute posthemorrhagic anemia: Secondary | ICD-10-CM | POA: Diagnosis present

## 2015-12-31 DIAGNOSIS — Z6832 Body mass index (BMI) 32.0-32.9, adult: Secondary | ICD-10-CM | POA: Diagnosis not present

## 2015-12-31 DIAGNOSIS — N189 Chronic kidney disease, unspecified: Secondary | ICD-10-CM | POA: Diagnosis present

## 2015-12-31 DIAGNOSIS — T8611 Kidney transplant rejection: Secondary | ICD-10-CM | POA: Diagnosis present

## 2015-12-31 DIAGNOSIS — K210 Gastro-esophageal reflux disease with esophagitis: Secondary | ICD-10-CM | POA: Diagnosis present

## 2015-12-31 DIAGNOSIS — Z9289 Personal history of other medical treatment: Secondary | ICD-10-CM

## 2015-12-31 DIAGNOSIS — Z792 Long term (current) use of antibiotics: Secondary | ICD-10-CM

## 2015-12-31 DIAGNOSIS — K2960 Other gastritis without bleeding: Secondary | ICD-10-CM | POA: Diagnosis present

## 2015-12-31 DIAGNOSIS — I10 Essential (primary) hypertension: Secondary | ICD-10-CM | POA: Diagnosis present

## 2015-12-31 DIAGNOSIS — N2581 Secondary hyperparathyroidism of renal origin: Secondary | ICD-10-CM | POA: Diagnosis present

## 2015-12-31 DIAGNOSIS — D649 Anemia, unspecified: Secondary | ICD-10-CM | POA: Diagnosis present

## 2015-12-31 DIAGNOSIS — E875 Hyperkalemia: Secondary | ICD-10-CM | POA: Diagnosis present

## 2015-12-31 DIAGNOSIS — Z940 Kidney transplant status: Secondary | ICD-10-CM

## 2015-12-31 DIAGNOSIS — Q2733 Arteriovenous malformation of digestive system vessel: Principal | ICD-10-CM

## 2015-12-31 DIAGNOSIS — Z7952 Long term (current) use of systemic steroids: Secondary | ICD-10-CM | POA: Diagnosis not present

## 2015-12-31 HISTORY — DX: Anemia, unspecified: D64.9

## 2015-12-31 HISTORY — DX: End stage renal disease: Z99.2

## 2015-12-31 HISTORY — DX: Hyperkalemia: E87.5

## 2015-12-31 HISTORY — DX: Kidney transplant status: Z94.0

## 2015-12-31 HISTORY — DX: Melena: K92.1

## 2015-12-31 HISTORY — DX: Gastro-esophageal reflux disease without esophagitis: K21.9

## 2015-12-31 HISTORY — DX: Personal history of other medical treatment: Z92.89

## 2015-12-31 HISTORY — DX: End stage renal disease: N18.6

## 2015-12-31 HISTORY — DX: Cardiac murmur, unspecified: R01.1

## 2015-12-31 LAB — CBC
HCT: 19.8 % — ABNORMAL LOW (ref 39.0–52.0)
Hemoglobin: 5.8 g/dL — CL (ref 13.0–17.0)
MCH: 25.6 pg — ABNORMAL LOW (ref 26.0–34.0)
MCHC: 29.3 g/dL — ABNORMAL LOW (ref 30.0–36.0)
MCV: 87.2 fL (ref 78.0–100.0)
Platelets: 195 10*3/uL (ref 150–400)
RBC: 2.27 MIL/uL — ABNORMAL LOW (ref 4.22–5.81)
RDW: 15.3 % (ref 11.5–15.5)
WBC: 5 10*3/uL (ref 4.0–10.5)

## 2015-12-31 LAB — COMPREHENSIVE METABOLIC PANEL
ALT: 17 U/L (ref 17–63)
AST: 16 U/L (ref 15–41)
Albumin: 2.9 g/dL — ABNORMAL LOW (ref 3.5–5.0)
Alkaline Phosphatase: 40 U/L (ref 38–126)
Anion gap: 9 (ref 5–15)
BUN: 52 mg/dL — ABNORMAL HIGH (ref 6–20)
CO2: 25 mmol/L (ref 22–32)
Calcium: 8.7 mg/dL — ABNORMAL LOW (ref 8.9–10.3)
Chloride: 106 mmol/L (ref 101–111)
Creatinine, Ser: 10.81 mg/dL — ABNORMAL HIGH (ref 0.61–1.24)
GFR calc Af Amer: 6 mL/min — ABNORMAL LOW (ref >60)
GFR calc non Af Amer: 5 mL/min — ABNORMAL LOW (ref >60)
Glucose, Bld: 120 mg/dL — ABNORMAL HIGH (ref 65–99)
Potassium: 5.6 mmol/L — ABNORMAL HIGH (ref 3.5–5.1)
Sodium: 140 mmol/L (ref 135–145)
Total Bilirubin: 0.5 mg/dL (ref 0.3–1.2)
Total Protein: 5.5 g/dL — ABNORMAL LOW (ref 6.5–8.1)

## 2015-12-31 LAB — POC OCCULT BLOOD, ED: Fecal Occult Bld: POSITIVE — AB

## 2015-12-31 LAB — ABO/RH: ABO/RH(D): A POS

## 2015-12-31 LAB — PREPARE RBC (CROSSMATCH)

## 2015-12-31 MED ORDER — SODIUM CHLORIDE 0.9 % IJ SOLN
3.0000 mL | Freq: Two times a day (BID) | INTRAMUSCULAR | Status: DC
  Administered 2016-01-02: 3 mL via INTRAVENOUS

## 2015-12-31 MED ORDER — SODIUM CHLORIDE 0.9 % IJ SOLN: 3.0000 mL | INTRAMUSCULAR | Status: DC | PRN

## 2015-12-31 MED ORDER — SODIUM CHLORIDE 0.9 % IV SOLN: 250.0000 mL | INTRAVENOUS | Status: DC | PRN

## 2015-12-31 MED ORDER — ACETAMINOPHEN 325 MG PO TABS: 650.0000 mg | ORAL_TABLET | Freq: Four times a day (QID) | ORAL | Status: DC | PRN

## 2015-12-31 MED ORDER — FLUCONAZOLE 100 MG PO TABS
50.0000 mg | ORAL_TABLET | Freq: Every day | ORAL | Status: DC
  Administered 2016-01-01 – 2016-01-02 (×2): 50 mg via ORAL
  Filled 2015-12-31 (×2): qty 1

## 2015-12-31 MED ORDER — SODIUM BICARBONATE 650 MG PO TABS
650.0000 mg | ORAL_TABLET | Freq: Every day | ORAL | Status: DC
  Administered 2015-12-31 – 2016-01-03 (×4): 650 mg via ORAL
  Filled 2015-12-31 (×5): qty 1

## 2015-12-31 MED ORDER — PREDNISONE 20 MG PO TABS
20.0000 mg | ORAL_TABLET | Freq: Every day | ORAL | Status: DC
  Administered 2016-01-01 – 2016-01-04 (×4): 20 mg via ORAL
  Filled 2015-12-31 (×4): qty 1

## 2015-12-31 MED ORDER — VALGANCICLOVIR HCL 450 MG PO TABS
450.0000 mg | ORAL_TABLET | ORAL | Status: DC
  Administered 2016-01-01 – 2016-01-03 (×2): 450 mg via ORAL
  Filled 2015-12-31 (×3): qty 1

## 2015-12-31 MED ORDER — SULFAMETHOXAZOLE-TRIMETHOPRIM 400-80 MG PO TABS
1.0000 | ORAL_TABLET | ORAL | Status: DC
  Administered 2016-01-01 – 2016-01-03 (×2): 1 via ORAL
  Filled 2015-12-31 (×3): qty 1

## 2015-12-31 MED ORDER — CALCIUM CARBONATE ANTACID 500 MG PO CHEW
2000.0000 mg | CHEWABLE_TABLET | Freq: Three times a day (TID) | ORAL | Status: DC
  Administered 2016-01-01 – 2016-01-04 (×5): 2000 mg via ORAL
  Filled 2015-12-31 (×4): qty 10

## 2015-12-31 MED ORDER — ONDANSETRON HCL 4 MG/2ML IJ SOLN: 4.0000 mg | Freq: Four times a day (QID) | INTRAMUSCULAR | Status: DC | PRN

## 2015-12-31 MED ORDER — TACROLIMUS 1 MG PO CAPS
5.0000 mg | ORAL_CAPSULE | Freq: Two times a day (BID) | ORAL | Status: DC
  Administered 2015-12-31 – 2016-01-01 (×2): 5 mg via ORAL
  Filled 2015-12-31 (×3): qty 5

## 2015-12-31 MED ORDER — AMLODIPINE BESYLATE 10 MG PO TABS
10.0000 mg | ORAL_TABLET | Freq: Every day | ORAL | Status: DC
  Administered 2015-12-31 – 2016-01-03 (×4): 10 mg via ORAL
  Filled 2015-12-31 (×4): qty 1

## 2015-12-31 MED ORDER — SODIUM BICARBONATE 650 MG PO TABS
1300.0000 mg | ORAL_TABLET | Freq: Every day | ORAL | Status: DC
  Administered 2016-01-01 – 2016-01-04 (×5): 1300 mg via ORAL
  Filled 2015-12-31 (×3): qty 2

## 2015-12-31 MED ORDER — PANTOPRAZOLE SODIUM 40 MG IV SOLR
40.0000 mg | INTRAVENOUS | Status: DC
  Administered 2015-12-31 – 2016-01-02 (×3): 40 mg via INTRAVENOUS
  Filled 2015-12-31 (×2): qty 40

## 2015-12-31 MED ORDER — SODIUM CHLORIDE 0.9 % IV SOLN
Freq: Once | INTRAVENOUS | Status: AC
  Administered 2016-01-02 (×2): via INTRAVENOUS

## 2015-12-31 MED ORDER — MYCOPHENOLATE SODIUM 180 MG PO TBEC
360.0000 mg | DELAYED_RELEASE_TABLET | Freq: Two times a day (BID) | ORAL | Status: DC
  Administered 2015-12-31 – 2016-01-01 (×2): 360 mg via ORAL
  Filled 2015-12-31 (×3): qty 2

## 2015-12-31 MED ORDER — SODIUM CHLORIDE 0.9 % IJ SOLN
3.0000 mL | Freq: Two times a day (BID) | INTRAMUSCULAR | Status: DC
  Administered 2015-12-31 – 2016-01-04 (×6): 3 mL via INTRAVENOUS

## 2015-12-31 MED ORDER — LABETALOL HCL 300 MG PO TABS
600.0000 mg | ORAL_TABLET | Freq: Two times a day (BID) | ORAL | Status: DC
  Administered 2015-12-31 – 2016-01-04 (×8): 600 mg via ORAL
  Filled 2015-12-31 (×8): qty 2

## 2015-12-31 MED ORDER — SODIUM CHLORIDE 0.9 % IV SOLN
10.0000 mL/h | Freq: Once | INTRAVENOUS | Status: AC
  Administered 2015-12-31: 10 mL/h via INTRAVENOUS

## 2015-12-31 MED ORDER — ACETAMINOPHEN 650 MG RE SUPP: 650.0000 mg | Freq: Four times a day (QID) | RECTAL | Status: DC | PRN

## 2015-12-31 MED ORDER — SODIUM BICARBONATE 650 MG PO TABS
650.0000 mg | ORAL_TABLET | Freq: Two times a day (BID) | ORAL | Status: DC
Start: 2015-12-31 — End: 2015-12-31

## 2015-12-31 MED ORDER — ONDANSETRON HCL 4 MG PO TABS: 4.0000 mg | ORAL_TABLET | Freq: Four times a day (QID) | ORAL | Status: DC | PRN

## 2015-12-31 NOTE — ED Provider Notes (Signed)
CSN: [id number]     Arrival date & time 12/31/15  1430 History   First MD Initiated Contact with Patient 12/31/15 1539     Chief Complaint  Patient presents with  . Rectal Bleeding     (Consider location/radiation/quality/duration/timing/severity/associated sxs/prior Treatment) Patient is a 40 y.o. male presenting with hematochezia. The history is provided by the patient.  Rectal Bleeding Quality:  Bright red Amount:  Copious Duration: 1. Timing:  Constant Progression:  Worsening Chronicity:  New Context: defecation, diarrhea (6-8 episodes of diarrhea today. last BM around 2pm was copious blood) and spontaneously   Context: not constipation   Similar prior episodes: no   Relieved by:  None tried Worsened by:  Nothing tried Ineffective treatments:  None tried Associated symptoms: vomiting (1 episode today. non bloodt)   Associated symptoms: no abdominal pain, no dizziness, no fever and no hematemesis   Associated symptoms comment:  Fatigue Risk factors: no anticoagulant use   Risk factors comment:  Hx of kidney transplant. undergoing rejection treatment for the past month   Past Medical History  Diagnosis Date  . Hypertension   . Anemia   . H/O kidney transplant   . Hyperkalemia   . Hematochezia   . Heart murmur     "slight one"  . History of blood transfusion 12/31/2015    "this is my 1st" (12/31/2015)  . GERD (gastroesophageal reflux disease)   . Renal disorder   . ESRD (end stage renal disease) on dialysis [facility])     "MWF; [facility]" (12/31/2015)   Past Surgical History  Procedure Laterality Date  . Kidney transplant  2013  . Arteriovenous graft placement Left 01/2006    forearm  . Av fistula repair  11/2015    "tried unsuccessfully to declot it with a balloon at [facility]"  . Insertion of dialysis catheter Right 11/2015    "neck; a temporary one"  . Insertion of dialysis catheter Right 11/2015    chest   Family History  Problem Relation Age of  Onset  . Hypertension Father    Social History  Substance Use Topics  . Smoking status: Never Smoker   . Smokeless tobacco: Never Used  . Alcohol Use: Yes     Comment: one beer a month    Review of Systems  Constitutional: Positive for chills and fatigue. Negative for fever.  HENT: Negative for congestion.   Eyes: Negative for redness.  Respiratory: Negative for cough and shortness of breath.   Cardiovascular: Negative for chest pain and leg swelling.  Gastrointestinal: Positive for vomiting (1 episode today. non bloodt), diarrhea, blood in stool and hematochezia. Negative for nausea, abdominal pain, constipation and hematemesis.  Genitourinary: Negative for decreased urine volume and difficulty urinating.  Musculoskeletal: Negative.   Skin: Positive for pallor. Negative for rash and wound.  Neurological: Negative for dizziness and headaches.      Allergies  Review of patient's allergies indicates no known allergies.  Home Medications   Prior to Admission medications   Medication Sig Start Date End Date Taking? Authorizing Provider  amLODipine (NORVASC) 10 MG tablet Take 10 mg by mouth at bedtime.    Yes Historical Provider, MD  Azelastine-Fluticasone (DYMISTA) 137-50 MCG/ACT SUSP Place 1 spray into the nose as needed (allergies).    Yes Historical Provider, MD  Bepotastine Besilate (BEPREVE) 1.5 % SOLN Apply 1 drop to eye as needed (allergies).    Yes Historical Provider, MD  calcium elemental as carbonate (TUMS ULTRA 1000) 400 MG  chewable tablet Chew 2,000 mg by mouth 3 (three) times daily. 12/08/15  Yes Historical Provider, MD  fluconazole (DIFLUCAN) 50 MG tablet Take 50 mg by mouth daily. 12/13/15  Yes Historical Provider, MD  labetalol (NORMODYNE) 300 MG tablet Take 600 mg by mouth 2 (two) times daily.    Yes Historical Provider, MD  mycophenolate (MYFORTIC) 180 MG EC tablet Take 360 mg by mouth 2 (two) times daily.    Yes Historical Provider, MD  predniSONE (DELTASONE) 5  MG tablet Take 20 mg by mouth daily with breakfast.   Yes Historical Provider, MD  sodium bicarbonate 650 MG tablet Take 650-1,300 mg by mouth 2 (two) times daily. Take 1300 mg in the morning and 650 mg in the evening 12/09/15  Yes Historical Provider, MD  sulfamethoxazole-trimethoprim (BACTRIM,SEPTRA) 400-80 MG tablet Take 1 tablet by mouth every Monday, Wednesday, and Friday. 12/09/15  Yes Historical Provider, MD  tacrolimus (PROGRAF) 1 MG capsule Take 5 mg by mouth 2 (two) times daily.    Yes Historical Provider, MD  valGANciclovir (VALCYTE) 450 MG tablet Take 450 mg by mouth every Monday, Wednesday, and Friday. 12/13/15  Yes Historical Provider, MD  methylPREDNISolone sodium succinate 1,000 mg in sodium chloride 0.9 % 50 mL Inject 1,000 mg into the vein daily. Patient not taking: Reported on 12/31/2015 12/03/15   Eugenie Filler, MD   BP 127/62 mmHg  Pulse 61  Temp(Src) 98.2 F (36.8 C) (Oral)  Resp 17  Ht 5\' 11"  (1.803 m)  Wt 105.235 kg  BMI 32.37 kg/m2  SpO2 100% Physical Exam  Constitutional: He is oriented to person, place, and time. He appears well-developed and well-nourished. No distress.  HENT:  Head: Normocephalic and atraumatic.  Mouth/Throat: Oropharynx is clear and moist.  Eyes: Pupils are equal, round, and reactive to light. No scleral icterus.  Neck: Normal range of motion. Neck supple.  Cardiovascular: Normal rate, regular rhythm and intact distal pulses.  Exam reveals no gallop and no friction rub.   No murmur heard. Pulmonary/Chest: Effort normal and breath sounds normal. No respiratory distress. He has no wheezes. He has no rales. He exhibits no tenderness.  Abdominal: Soft. He exhibits no distension. There is no tenderness. There is no rebound and no guarding.  Genitourinary: Guaiac positive stool.  Musculoskeletal: Normal range of motion. He exhibits no edema or tenderness.  Neurological: He is alert and oriented to person, place, and time. No cranial nerve  deficit. Coordination normal.  Skin: Skin is warm and dry. No rash noted. He is not diaphoretic. No erythema. There is pallor.  Psychiatric: He has a normal mood and affect.  Nursing note and vitals reviewed.   ED Course  Procedures (including critical care time) Labs Review Labs Reviewed  COMPREHENSIVE METABOLIC PANEL - Abnormal; Notable for the following:    Potassium 5.6 (*)    Glucose, Bld 120 (*)    BUN 52 (*)    Creatinine, Ser 10.81 (*)    Calcium 8.7 (*)    Total Protein 5.5 (*)    Albumin 2.9 (*)    GFR calc non Af Amer 5 (*)    GFR calc Af Amer 6 (*)    All other components within normal limits  CBC - Abnormal; Notable for the following:    RBC 2.27 (*)    Hemoglobin 5.8 (*)    HCT 19.8 (*)    MCH 25.6 (*)    MCHC 29.3 (*)    All other components within normal limits  POC OCCULT BLOOD, ED - Abnormal; Notable for the following:    Fecal Occult Bld POSITIVE (*)    All other components within normal limits  MRSA PCR SCREENING  COMPREHENSIVE METABOLIC PANEL  CBC  TYPE AND SCREEN  ABO/RH  PREPARE RBC (CROSSMATCH)  PREPARE RBC (CROSSMATCH)    Imaging Review No results found. I have personally reviewed and evaluated these images and lab results as part of my medical decision-making.   EKG Interpretation None      MDM   Final diagnoses:  Lower GI bleeding  Renal transplant recipient  Severe anemia    Patient is a 40 year old kidney transplant patient undergoing treatment for rejection who presents with 1 day history of diarrhea and frank bloody stool today. Also reports fatigue but is otherwise without chest pain, abdominal pain, fevers, shortness breath. Vital signs are reassuring and guaiac positive stool. Hemoglobin found to be in the fives. Patient will be given a unit of irradiated red blood cells and will be admitted to medicine for further management and evaluation.  Heriberto Antigua, MD 01/01/16 [id number]  Malvin Johns, MD 01/01/16 [phone]

## 2015-12-31 NOTE — ED Notes (Signed)
Hgb 5.8. Nurse at  Nurse first informed and pt to be taken to next available room.

## 2015-12-31 NOTE — ED Notes (Addendum)
Patient here reporting that he has noticed blood in his stool since am, reports that he has been to bathroom 6-8 times and last episode frank blood in stool, no abdominal pain. Patient on high doses of steroids due to kidney transplant and he states they are trying to prevent rejection. States that he feels weak

## 2015-12-31 NOTE — H&P (Signed)
[facility] History and Physical  ALDRICK AVERILL [id number] DOB: April 12, 1976 DOA: 12/31/2015  Referring physician: Lanetta Inch PCP: Pcp Not In System   Chief Complaint: BRBPR, melena  HPI: Tim Baker is a 40 y.o. male with a past medical history that includes kidney transplant 2013, kidney disease on dialysis Monday Wednesday Friday, hypertension, anemia, presents to emergency department with the chief complaint of bright red blood per rectum since early this morning. Initial evaluation reveals acute blood loss anemia with a hemoglobin of 5.8 and +FOBT.  Patient reports he was in his usual state of health until this morning he developed sudden diarrhea. He reports 8 episodes of loose stool. He states that the first 3 or 4 were very dark in color and watery in consistency. He states the last 2 noted frank blood in his stool. He denies any clots. He denies abdominal pain does endorse some nausea yesterday and some light brown emesis. He is on high doses of steroids due to kidney transplant. He denies any fever chills chest pain palpitations syncope or near-syncope. He does report brief dizziness with position change.  In the emergency department he is hemodynamically stable and not hypoxic.   Review of Systems:  10 point review of systems complete and all systems are negative except as indicated in the history of present illness  Past Medical History  Diagnosis Date  . Hypertension   . Renal disorder   . ESRD (end stage renal disease) ([place])     mon wed fri  . Anemia   . H/O kidney transplant   . GI bleed   . Hyperkalemia    Past Surgical History  Procedure Laterality Date  . Kidney transplant      2013   Social History:  reports that he has never smoked. He has never used smokeless tobacco. He reports that he drinks alcohol. He reports that he does not use illicit drugs. He lives at home he is currently unemployed independent with ADLs No Known Allergies  Family  History  Problem Relation Age of Onset  . Hypertension Father      Prior to Admission medications   Medication Sig Start Date End Date Taking? Authorizing Provider  amLODipine (NORVASC) 10 MG tablet Take 10 mg by mouth at bedtime.    Yes Historical Provider, MD  Azelastine-Fluticasone (DYMISTA) 137-50 MCG/ACT SUSP Place 1 spray into the nose as needed (allergies).    Yes Historical Provider, MD  Bepotastine Besilate (BEPREVE) 1.5 % SOLN Apply 1 drop to eye as needed (allergies).    Yes Historical Provider, MD  calcium elemental as carbonate (TUMS ULTRA 1000) 400 MG chewable tablet Chew 2,000 mg by mouth 3 (three) times daily. 12/08/15  Yes Historical Provider, MD  fluconazole (DIFLUCAN) 50 MG tablet Take 50 mg by mouth daily. 12/13/15  Yes Historical Provider, MD  labetalol (NORMODYNE) 300 MG tablet Take 600 mg by mouth 2 (two) times daily.    Yes Historical Provider, MD  mycophenolate (MYFORTIC) 180 MG EC tablet Take 360 mg by mouth 2 (two) times daily.    Yes Historical Provider, MD  predniSONE (DELTASONE) 5 MG tablet Take 20 mg by mouth daily with breakfast.   Yes Historical Provider, MD  sodium bicarbonate 650 MG tablet Take 650-1,300 mg by mouth 2 (two) times daily. Take 1300 mg in the morning and 650 mg in the evening 12/09/15  Yes Historical Provider, MD  sulfamethoxazole-trimethoprim (BACTRIM,SEPTRA) 400-80 MG tablet Take 1 tablet by mouth every Monday, Wednesday, and  Friday. 12/09/15  Yes Historical Provider, MD  tacrolimus (PROGRAF) 1 MG capsule Take 5 mg by mouth 2 (two) times daily.    Yes Historical Provider, MD  valGANciclovir (VALCYTE) 450 MG tablet Take 450 mg by mouth every Monday, Wednesday, and Friday. 12/13/15  Yes Historical Provider, MD  methylPREDNISolone sodium succinate 1,000 mg in sodium chloride 0.9 % 50 mL Inject 1,000 mg into the vein daily. Patient not taking: Reported on 12/31/2015 12/03/15   Eugenie Filler, MD   Physical Exam: Filed Vitals:   12/31/15 1600  12/31/15 1615 12/31/15 1645 12/31/15 1715  BP: 133/80 129/79 135/74 135/81  Pulse: 61 62 61 60  Temp:      TempSrc:      Resp:  18  18  Height:      Weight:      SpO2: 100% 100% 100% 100%    Wt Readings from Last 3 Encounters:  12/31/15 105.235 kg (232 lb)  12/03/15 116 kg (255 lb 11.7 oz)    General:  Appears calm and comfortable, obese Eyes: PERRL, normal lids, irises & conjunctiva ENT: grossly normal hearing, his membranes of his mouth are slightly pale but moist Neck: no LAD, masses or thyromegaly Cardiovascular: RRR, no m/r/g. No LE edema. Telemetry: SR, no arrhythmias  Respiratory: CTA bilaterally, no w/r/r. Normal respiratory effort. Abdomen: soft, ntnd positive bowel sounds no guarding Skin: no rash or induration seen on limited exam Musculoskeletal: grossly normal tone BUE/BLE Psychiatric: grossly normal mood and affect, speech fluent and appropriate Neurologic: grossly non-focal. Speech clear facial symmetry           Labs on Admission:  Basic Metabolic Panel:  Recent Labs Lab 12/31/15 1452  NA 140  K 5.6*  CL 106  CO2 25  GLUCOSE 120*  BUN 52*  CREATININE 10.81*  CALCIUM 8.7*   Liver Function Tests:  Recent Labs Lab 12/31/15 1452  AST 16  ALT 17  ALKPHOS 40  BILITOT 0.5  PROT 5.5*  ALBUMIN 2.9*   No results for input(s): LIPASE, AMYLASE in the last 168 hours. No results for input(s): AMMONIA in the last 168 hours. CBC:  Recent Labs Lab 12/31/15 1452  WBC 5.0  HGB 5.8*  HCT 19.8*  MCV 87.2  PLT 195   Cardiac Enzymes: No results for input(s): CKTOTAL, CKMB, CKMBINDEX, TROPONINI in the last 168 hours.  BNP (last 3 results) No results for input(s): BNP in the last 8760 hours.  ProBNP (last 3 results) No results for input(s): PROBNP in the last 8760 hours.  CBG: No results for input(s): GLUCAP in the last 168 hours.  Radiological Exams on Admission: No results found.  EKG:   Assessment/Plan Principal Problem:   Acute  blood loss anemia Active Problems:   Renal transplant recipient   Hyperkalemia   Anemia   HTN (hypertension)   CKD (chronic kidney disease)   Hematochezia  #1. Acute blood loss anemia. Patient on high doses of steroids as transplant patient. He denies any NSAID use.  Hg 5.8 on admission. Chart review indicates his baseline hemoglobin close to 10. guac positive stool. Patient reports never having had colonoscopy or endoscopy. Emergency department ordered 1 unit of packed red blood cells -Admit to telemetry -Will order an additional unit of packed red blood cells for a total of 2 units. -Serial CBCs -GI consult -Clear liquids and nothing by mouth past midnight  #2. Hematochezia/light brown emesis. High doses of steroids recently to prevent rejection of kidney. See #1. No  history of same. No previous colonoscopy -Clear liquid diet -Nothing by mouth past midnight -GI consult -Protoni X  #3. Renal transplant patient/chronic kidney disease. Creatinine 10.8 and admission. Chart review indicates 19.54 weeks ago. 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 See discharge summary dated December 2016. -Continue his home medications  #4. Hyperkalemia. Potassium is 5.6 on admission. Related to #3. -Monitor on telemetry -Follow to consult for Monday Wednesday Friday dialysis patient  #5. Hypertension. Controlled. -Continue home medications  GI and Nephrology per dr Lanetta Inch in ED   Code Status: full DVT Prophylaxis: Family Communication: none present Disposition Plan: home when ready  Time spent: 58 minutes  [facility]

## 2015-12-31 NOTE — Consult Note (Signed)
Reason for Consult: Hematochezia and anemia Referring Physician: [facility]  Olevia Bowens HPI: This is a 40 year old male with a PMH of renal transplant in 2013 with rejection starting one month ago, dialysis on MWF, and HTN admitted for severe anemia and hematochezia.  This AM the patient started to have a sudden onset of loose bowel movements, which then progressed to hematochezia.  His admission HGB was at 5.8 g/dL.  He denies any issues with abdominal pain, nausea, or vomiting.  There is no family history of colon cancer and he did not have a pretransplant colonoscopy.  Additionally, the patient denies any NSAID use.  Past Medical History  Diagnosis Date  . Hypertension   . Renal disorder   . ESRD (end stage renal disease) ([place])     mon wed fri  . Anemia   . H/O kidney transplant   . GI bleed   . Hyperkalemia     Past Surgical History  Procedure Laterality Date  . Kidney transplant      2013    Family History  Problem Relation Age of Onset  . Hypertension Father     Social History:  reports that he has never smoked. He has never used smokeless tobacco. He reports that he drinks alcohol. He reports that he does not use illicit drugs.  Allergies: No Known Allergies  Medications:  Scheduled: . pantoprazole (PROTONIX) IV  40 mg Intravenous Q24H   Continuous:   Results for orders placed or performed during the hospital encounter of 12/31/15 (from the past 24 hour(s))  Comprehensive metabolic panel     Status: Abnormal   Collection Time: 12/31/15  2:52 PM  Result Value Ref Range   Sodium 140 135 - 145 mmol/L   Potassium 5.6 (H) 3.5 - 5.1 mmol/L   Chloride 106 101 - 111 mmol/L   CO2 25 22 - 32 mmol/L   Glucose, Bld 120 (H) 65 - 99 mg/dL   BUN 52 (H) 6 - 20 mg/dL   Creatinine, Ser 10.81 (H) 0.61 - 1.24 mg/dL   Calcium 8.7 (L) 8.9 - 10.3 mg/dL   Total Protein 5.5 (L) 6.5 - 8.1 g/dL   Albumin 2.9 (L) 3.5 - 5.0 g/dL   AST 16 15 - 41 U/L   ALT 17 17 - 63 U/L    Alkaline Phosphatase 40 38 - 126 U/L   Total Bilirubin 0.5 0.3 - 1.2 mg/dL   GFR calc non Af Amer 5 (L) >60 mL/min   GFR calc Af Amer 6 (L) >60 mL/min   Anion gap 9 5 - 15  CBC     Status: Abnormal   Collection Time: 12/31/15  2:52 PM  Result Value Ref Range   WBC 5.0 4.0 - 10.5 K/uL   RBC 2.27 (L) 4.22 - 5.81 MIL/uL   Hemoglobin 5.8 (LL) 13.0 - 17.0 g/dL   HCT 19.8 (L) 39.0 - 52.0 %   MCV 87.2 78.0 - 100.0 fL   MCH 25.6 (L) 26.0 - 34.0 pg   MCHC 29.3 (L) 30.0 - 36.0 g/dL   RDW 15.3 11.5 - 15.5 %   Platelets 195 150 - 400 K/uL  Type and screen [place]     Status: None (Preliminary result)   Collection Time: 12/31/15  2:52 PM  Result Value Ref Range   ABO/RH(D) A POS    Antibody Screen NEG    Sample Expiration 01/03/2016    Unit Number [id number]  Blood Component Type RBC, LR IRR    Unit division 00    Status of Unit ISSUED    Transfusion Status OK TO TRANSFUSE    Crossmatch Result Compatible   ABO/Rh     Status: None   Collection Time: 12/31/15  2:52 PM  Result Value Ref Range   ABO/RH(D) A POS   POC occult blood, ED     Status: Abnormal   Collection Time: 12/31/15  4:22 PM  Result Value Ref Range   Fecal Occult Bld POSITIVE (A) NEGATIVE  Prepare RBC     Status: None   Collection Time: 12/31/15  4:30 PM  Result Value Ref Range   Order Confirmation ORDER PROCESSED BY BLOOD BANK      No results found.  ROS:  As stated above in the HPI otherwise negative.  Blood pressure 139/91, pulse 61, temperature 98.3 F (36.8 C), temperature source Oral, resp. rate 16, height 5\' 11"  (1.803 m), weight 105.235 kg (232 lb), SpO2 100 %.    PE: Gen: NAD, Alert and Oriented HEENT:  [place]/AT, EOMI Neck: Supple, no LAD Lungs: CTA Bilaterally CV: RRR without M/G/R ABM: Soft, NTND, +BS Ext: No C/C/E Rectal: Deferred.  ER physician reported that it was bloody.  Assessment/Plan: 1) Hematochezia. 2) ? Melena. 3) S/p renal transplant with acute rejection on  temporary dialysis. 4) Anemia   The patient's blood pressure is stable, but he reports feeling dizzy with standing up with the bleeding.  Since his arrival to the ER at 2 PM he denies any further hematochezia.  Further evaluation is required with an EGD/Colonoscopy, but with his reports of hypotensive effects, I will hold off on the procedure until Thursday.  Plan: 1) EGD/Colonoscopy Thursday. 2) Follow HGB and transfuse as necessary.  Helaman Mecca D 12/31/2015, 6:20 PM

## 2016-01-01 DIAGNOSIS — I10 Essential (primary) hypertension: Secondary | ICD-10-CM

## 2016-01-01 DIAGNOSIS — D62 Acute posthemorrhagic anemia: Secondary | ICD-10-CM

## 2016-01-01 DIAGNOSIS — Z940 Kidney transplant status: Secondary | ICD-10-CM

## 2016-01-01 LAB — PREPARE RBC (CROSSMATCH)

## 2016-01-01 LAB — COMPREHENSIVE METABOLIC PANEL
ALT: 15 U/L — ABNORMAL LOW (ref 17–63)
AST: 13 U/L — ABNORMAL LOW (ref 15–41)
Albumin: 2.9 g/dL — ABNORMAL LOW (ref 3.5–5.0)
Alkaline Phosphatase: 38 U/L (ref 38–126)
Anion gap: 10 (ref 5–15)
BUN: 65 mg/dL — ABNORMAL HIGH (ref 6–20)
CO2: 21 mmol/L — ABNORMAL LOW (ref 22–32)
Calcium: 9.1 mg/dL (ref 8.9–10.3)
Chloride: 109 mmol/L (ref 101–111)
Creatinine, Ser: 12.52 mg/dL — ABNORMAL HIGH (ref 0.61–1.24)
GFR calc Af Amer: 5 mL/min — ABNORMAL LOW (ref >60)
GFR calc non Af Amer: 4 mL/min — ABNORMAL LOW (ref >60)
Glucose, Bld: 81 mg/dL (ref 65–99)
Potassium: 5.1 mmol/L (ref 3.5–5.1)
Sodium: 140 mmol/L (ref 135–145)
Total Bilirubin: 0.9 mg/dL (ref 0.3–1.2)
Total Protein: 5.4 g/dL — ABNORMAL LOW (ref 6.5–8.1)

## 2016-01-01 LAB — HEMOGLOBIN AND HEMATOCRIT, BLOOD
HCT: 21.4 % — ABNORMAL LOW (ref 39.0–52.0)
HCT: 27.6 % — ABNORMAL LOW (ref 39.0–52.0)
Hemoglobin: 6.8 g/dL — CL (ref 13.0–17.0)
Hemoglobin: 9.1 g/dL — ABNORMAL LOW (ref 13.0–17.0)

## 2016-01-01 LAB — MRSA PCR SCREENING: MRSA by PCR: NEGATIVE

## 2016-01-01 MED ORDER — LIDOCAINE HCL (PF) 1 % IJ SOLN: 5.0000 mL | INTRAMUSCULAR | Status: DC | PRN

## 2016-01-01 MED ORDER — PENTAFLUOROPROP-TETRAFLUOROETH EX AERO: 1.0000 "application " | INHALATION_SPRAY | CUTANEOUS | Status: DC | PRN

## 2016-01-01 MED ORDER — BOOST / RESOURCE BREEZE PO LIQD
1.0000 | Freq: Three times a day (TID) | ORAL | Status: DC
  Administered 2016-01-01 – 2016-01-04 (×4): 1 via ORAL

## 2016-01-01 MED ORDER — LIDOCAINE-PRILOCAINE 2.5-2.5 % EX CREA
1.0000 "application " | TOPICAL_CREAM | CUTANEOUS | Status: DC | PRN
  Filled 2016-01-01: qty 5

## 2016-01-01 MED ORDER — PEG 3350-KCL-NA BICARB-NACL 420 G PO SOLR
4000.0000 mL | Freq: Once | ORAL | Status: AC
Start: 2016-01-01 — End: 2016-01-01
  Administered 2016-01-01: 4000 mL via ORAL
  Filled 2016-01-01: qty 4000

## 2016-01-01 MED ORDER — ANTICOAGULANT SODIUM CITRATE 4% (200MG/5ML) IV SOLN
5.0000 mL | Freq: Once | Status: DC
  Filled 2016-01-01: qty 250

## 2016-01-01 MED ORDER — TACROLIMUS 1 MG PO CAPS
5.0000 mg | ORAL_CAPSULE | Freq: Two times a day (BID) | ORAL | Status: DC
  Administered 2016-01-01 – 2016-01-04 (×6): 5 mg via ORAL
  Filled 2016-01-01 (×5): qty 5

## 2016-01-01 MED ORDER — ALTEPLASE 2 MG IJ SOLR
2.0000 mg | Freq: Once | INTRAMUSCULAR | Status: DC | PRN
  Filled 2016-01-01: qty 2

## 2016-01-01 MED ORDER — SODIUM CHLORIDE 0.9 % IV SOLN: Freq: Once | INTRAVENOUS | Status: DC

## 2016-01-01 MED ORDER — SODIUM CHLORIDE 0.9 % IV SOLN: 100.0000 mL | INTRAVENOUS | Status: DC | PRN

## 2016-01-01 MED ORDER — HEPARIN SODIUM (PORCINE) 1000 UNIT/ML DIALYSIS: 1000.0000 [IU] | INTRAMUSCULAR | Status: DC | PRN

## 2016-01-01 MED ORDER — PEG 3350-KCL-NA BICARB-NACL 420 G PO SOLR: 4000.0000 mL | Freq: Once | ORAL | Status: DC

## 2016-01-01 MED ORDER — MYCOPHENOLATE SODIUM 180 MG PO TBEC
360.0000 mg | DELAYED_RELEASE_TABLET | Freq: Two times a day (BID) | ORAL | Status: DC
  Administered 2016-01-01 – 2016-01-04 (×6): 360 mg via ORAL
  Filled 2016-01-01 (×5): qty 2

## 2016-01-01 NOTE — Progress Notes (Signed)
UNASSIGNED PATIENT Subjective: Patient was seen and examined while he was undergoing dialysis today. He's had no further rectal bleeding since his initial episode when he came to the emergency room yesterday. He denies having any abdominal pain nausea vomiting dysphagia odynophagia. He feels weak but has not had any dizziness today. He had a bowel movement this morning when he did not notice any melena or hematochezia.  Objective: Vital signs in last 24 hours: Temp:  [98 F (36.7 C)-98.8 F (37.1 C)] 98.1 F (36.7 C) (01/18 1615) Pulse Rate:  [57-78] 60 (01/18 1700) Resp:  [16-20] 19 (01/18 1700) BP: (123-153)/(62-92) 138/77 mmHg (01/18 1700) SpO2:  [100 %] 100 % (01/18 1510) Weight:  [103.1 kg (227 lb 4.7 oz)] 103.1 kg (227 lb 4.7 oz) (01/18 1244) Last BM Date: 12/31/15  Intake/Output from previous day: 01/17 0701 - 01/18 0700 In: 910 [P.O.:240; I.V.:335; Blood:335] Out: 200 [Stool:200] Intake/Output this shift: Total I/O In: 622 [Blood:622] Out: 1800 [Other:1800]  General appearance: alert, cooperative, appears stated age, no distress and mildly obese Resp: clear to auscultation bilaterally Cardio: regular rate and rhythm, S1, S2 normal, no murmur, click, rub or gallop GI: soft, non-tender; bowel sounds normal; no masses,  no organomegaly  Lab Results:  Recent Labs  12/31/15 1452 01/01/16 0547 01/01/16 1309  WBC 5.0 7.3  --   HGB 5.8* 6.8* 6.8*  HCT 19.8* 21.0* 21.4*  PLT 195 175  --    BMET  Recent Labs  12/31/15 1452 01/01/16 0547  NA 140 140  K 5.6* 5.1  CL 106 109  CO2 25 21*  GLUCOSE 120* 81  BUN 52* 65*  CREATININE 10.81* 12.52*  CALCIUM 8.7* 9.1   LFT  Recent Labs  01/01/16 0547  PROT 5.4*  ALBUMIN 2.9*  AST 13*  ALT 15*  ALKPHOS 38  BILITOT 0.9    Medications: I have reviewed the patient's current medications.  Assessment/Plan: Rectal bleeding with severe anemia/ESRD: Will plan to do an EGD/Colonoscopy tomorrow. Will prep tonite.   LOS: 1 day   Tatayana Beshears 01/01/2016, 5:34 PM

## 2016-01-01 NOTE — Consult Note (Signed)
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 He presented with malaise, a tender renal allograft on exam, and a BUN of 85, creat of 15.10, bicarb of 9 and potassium of > 7.5 with peaked T waves which was treated with dialysis, high dose steroids and transferred to [facility] and treated with high dose steroids and Thymoglobulin after renal biopsy revealed rejection.  He remains dialysis dependent MWF and presented to ED with hematochezia and frequent stools and Hgb to 5.8 gm. Creat was 10.81.   Past Medical History  Diagnosis Date  . Hypertension   . Anemia   . H/O kidney transplant   . Hyperkalemia   . Hematochezia   . Heart murmur     "slight one"  . History of blood transfusion 12/31/2015    "this is my 1st" (12/31/2015)  . GERD (gastroesophageal reflux disease)   . Renal disorder   . ESRD (end stage renal disease) on dialysis [facility])     "MWF; [facility]" (12/31/2015)   Past Surgical History  Procedure Laterality Date  . Kidney transplant  2013  . Arteriovenous graft placement Left 01/2006    forearm  . Av fistula repair  11/2015    "tried unsuccessfully to declot it with a balloon at [facility]"  . Insertion of dialysis catheter Right 11/2015    "neck; a temporary one"  . Insertion of dialysis catheter Right 11/2015    chest   Social History:  reports that he has never smoked. He has never used smokeless tobacco. He reports that he drinks alcohol. He reports that he does not use illicit drugs. Allergies: No Known Allergies Family History  Problem Relation Age of Onset  . Hypertension Father     Medications:  Prior to Admission:  Prescriptions prior to admission  Medication Sig Dispense Refill Last Dose  . amLODipine (NORVASC) 10 MG tablet Take 10 mg by mouth at bedtime.    12/30/2015 at Unknown time  .  Azelastine-Fluticasone (DYMISTA) 137-50 MCG/ACT SUSP Place 1 spray into the nose as needed (allergies).    over 30 days  . Bepotastine Besilate (BEPREVE) 1.5 % SOLN Apply 1 drop to eye as needed (allergies).    over 30 days  . calcium elemental as carbonate (TUMS ULTRA 1000) 400 MG chewable tablet Chew 2,000 mg by mouth 3 (three) times daily.   12/31/2015 at Unknown time  . fluconazole (DIFLUCAN) 50 MG tablet Take 50 mg by mouth daily.  0 12/31/2015 at Unknown time  . labetalol (NORMODYNE) 300 MG tablet Take 600 mg by mouth 2 (two) times daily.    12/31/2015 at 1000  . mycophenolate (MYFORTIC) 180 MG EC tablet Take 360 mg by mouth 2 (two) times daily.    12/31/2015 at Unknown time  . predniSONE (DELTASONE) 5 MG tablet Take 20 mg by mouth daily with breakfast.   12/31/2015 at Unknown time  . sodium bicarbonate 650 MG tablet Take 650-1,300 mg by mouth 2 (two) times daily. Take 1300 mg in the morning and 650 mg in the evening  0 12/31/2015 at Unknown time  . sulfamethoxazole-trimethoprim (BACTRIM,SEPTRA) 400-80 MG tablet Take 1 tablet by mouth every Monday, Wednesday, and Friday.  4 12/30/2015 at Unknown time  . tacrolimus (PROGRAF) 1 MG capsule Take 5 mg by mouth 2 (two) times daily.    12/31/2015 at Unknown  time  . valGANciclovir (VALCYTE) 450 MG tablet Take 450 mg by mouth every Monday, Wednesday, and Friday.   12/30/2015 at Unknown time  . methylPREDNISolone sodium succinate 1,000 mg in sodium chloride 0.9 % 50 mL Inject 1,000 mg into the vein daily. (Patient not taking: Reported on 12/31/2015)   Completed Course at Unknown time   Scheduled: . sodium chloride   Intravenous Once  . sodium chloride   Intravenous Once  . amLODipine  10 mg Oral QHS  . anticoagulant sodium citrate  5 mL Intravenous Once  . calcium carbonate  2,000 mg Oral TID WC  . fluconazole  50 mg Oral Daily  . labetalol  600 mg Oral BID  . mycophenolate  360 mg Oral BID  . pantoprazole (PROTONIX) IV  40 mg Intravenous Q24H  .  polyethylene glycol-electrolytes  4,000 mL Oral Once  . predniSONE  20 mg Oral Q breakfast  . sodium bicarbonate  1,300 mg Oral Daily  . sodium bicarbonate  650 mg Oral QHS  . sodium chloride  3 mL Intravenous Q12H  . sodium chloride  3 mL Intravenous Q12H  . sulfamethoxazole-trimethoprim  1 tablet Oral Q M,W,F-2000  . tacrolimus  5 mg Oral BID  . valGANciclovir  450 mg Oral Q M,W,F-2000   ROS: as per HPI otherwise not remarkable Blood pressure 142/85, pulse 60, temperature 98.1 F (36.7 C), temperature source Oral, resp. rate 20, height _0  (1.803 m), weight 103.1 kg (227 lb 4.7 oz), SpO2 100 %.  General appearance: alert and cooperative Head: Normocephalic, without obvious abnormality, atraumatic Eyes: negative Ears: normal TM's and external ear canals both ears Nose: Nares normal. Septum midline. Mucosa normal. No drainage or sinus tenderness. Throat: lips, mucosa, and tongue normal; teeth and gums normal Resp: clear to auscultation bilaterally  Chest wall: no tenderness  Right IJ TDC Cardio: regular rate and rhythm, S1, S2 normal, no murmur, click, rub or gallop GI: soft, non-tender; bowel sounds normal; no masses,  no organomegaly Extremities: extremities normal, atraumatic, no cyanosis or edema Skin: Skin color, texture, turgor normal. No rashes or lesions Neurologic: Grossly normal Results for orders placed or performed during the hospital encounter of 12/31/15 (from the past 48 hour(s))  Comprehensive metabolic panel     Status: Abnormal   Collection Time: 12/31/15  2:52 PM  Result Value Ref Range   Sodium 140 135 - 145 mmol/L   Potassium 5.6 (H) 3.5 - 5.1 mmol/L   Chloride 106 101 - 111 mmol/L   CO2 25 22 - 32 mmol/L   Glucose, Bld 120 (H) 65 - 99 mg/dL   BUN 52 (H) 6 - 20 mg/dL   Creatinine, Ser 10.81 (H) 0.61 - 1.24 mg/dL   Calcium 8.7 (L) 8.9 - 10.3 mg/dL   Total Protein 5.5 (L) 6.5 - 8.1 g/dL   Albumin 2.9 (L) 3.5 - 5.0 g/dL   AST 16 15 - 41 U/L   ALT 17 17 -  63 U/L   Alkaline Phosphatase 40 38 - 126 U/L   Total Bilirubin 0.5 0.3 - 1.2 mg/dL   GFR calc non Af Amer 5 (L) >60 mL/min   GFR calc Af Amer 6 (L) >60 mL/min    Comment: (NOTE) The eGFR has been calculated using the CKD EPI equation. This calculation has not been validated in all clinical situations. eGFR's persistently <60 mL/min signify possible Chronic Kidney Disease.    Anion gap 9 5 - 15  CBC     Status:  Abnormal   Collection Time: 12/31/15  2:52 PM  Result Value Ref Range   WBC 5.0 4.0 - 10.5 K/uL   RBC 2.27 (L) 4.22 - 5.81 MIL/uL   Hemoglobin 5.8 (LL) 13.0 - 17.0 g/dL    Comment: REPEATED TO VERIFY CRITICAL RESULT CALLED TO, READ BACK BY AND VERIFIED WITH: COMPTON,M RN _0  BY GRINSTEAD,C 1.17.17    HCT 19.8 (L) 39.0 - 52.0 %   MCV 87.2 78.0 - 100.0 fL   MCH 25.6 (L) 26.0 - 34.0 pg   MCHC 29.3 (L) 30.0 - 36.0 g/dL   RDW 15.3 11.5 - 15.5 %   Platelets 195 150 - 400 K/uL  Type and screen [place]     Status: None (Preliminary result)   Collection Time: 12/31/15  2:52 PM  Result Value Ref Range   ABO/RH(D) A POS    Antibody Screen NEG    Sample Expiration 01/03/2016    Unit Number [id number]    Blood Component Type RBC, LR IRR    Unit division 00    Status of Unit ISSUED,FINAL    Transfusion Status OK TO TRANSFUSE    Crossmatch Result Compatible    Unit Number [id number]    Blood Component Type RBC, LR IRR    Unit division 00    Status of Unit ISSUED,FINAL    Transfusion Status OK TO TRANSFUSE    Crossmatch Result Compatible    Unit Number [id number]    Blood Component Type RCLI PHER 2    Unit division 00    Status of Unit ALLOCATED    Transfusion Status OK TO TRANSFUSE    Crossmatch Result Compatible    Unit Number [id number]    Blood Component Type RBC, LR IRR    Unit division 00    Status of Unit ALLOCATED    Transfusion Status OK TO TRANSFUSE    Crossmatch Result Compatible   ABO/Rh     Status: None   Collection  Time: 12/31/15  2:52 PM  Result Value Ref Range   ABO/RH(D) A POS   POC occult blood, ED     Status: Abnormal   Collection Time: 12/31/15  4:22 PM  Result Value Ref Range   Fecal Occult Bld POSITIVE (A) NEGATIVE  Prepare RBC     Status: None   Collection Time: 12/31/15  4:30 PM  Result Value Ref Range   Order Confirmation ORDER PROCESSED BY BLOOD BANK   Prepare RBC     Status: None   Collection Time: 12/31/15  9:05 PM  Result Value Ref Range   Order Confirmation ORDER PROCESSED BY BLOOD BANK   MRSA PCR Screening     Status: None   Collection Time: 01/01/16 12:14 AM  Result Value Ref Range   MRSA by PCR NEGATIVE NEGATIVE    Comment:        The GeneXpert MRSA Assay (FDA approved for NASAL specimens only), is one component of a comprehensive MRSA colonization surveillance program. It is not intended to diagnose MRSA infection nor to guide or monitor treatment for MRSA infections.   Comprehensive metabolic panel     Status: Abnormal   Collection Time: 01/01/16  5:47 AM  Result Value Ref Range   Sodium 140 135 - 145 mmol/L   Potassium 5.1 3.5 - 5.1 mmol/L   Chloride 109 101 - 111 mmol/L   CO2 21 (L) 22 - 32 mmol/L   Glucose, Bld 81 65 - 99 mg/dL   BUN  65 (H) 6 - 20 mg/dL   Creatinine, Ser 12.52 (H) 0.61 - 1.24 mg/dL   Calcium 9.1 8.9 - 10.3 mg/dL   Total Protein 5.4 (L) 6.5 - 8.1 g/dL   Albumin 2.9 (L) 3.5 - 5.0 g/dL   AST 13 (L) 15 - 41 U/L   ALT 15 (L) 17 - 63 U/L   Alkaline Phosphatase 38 38 - 126 U/L   Total Bilirubin 0.9 0.3 - 1.2 mg/dL   GFR calc non Af Amer 4 (L) >60 mL/min   GFR calc Af Amer 5 (L) >60 mL/min    Comment: (NOTE) The eGFR has been calculated using the CKD EPI equation. This calculation has not been validated in all clinical situations. eGFR's persistently <60 mL/min signify possible Chronic Kidney Disease.    Anion gap 10 5 - 15  CBC     Status: Abnormal   Collection Time: 01/01/16  5:47 AM  Result Value Ref Range   WBC 7.3 4.0 - 10.5 K/uL    RBC 2.49 (L) 4.22 - 5.81 MIL/uL   Hemoglobin 6.8 (LL) 13.0 - 17.0 g/dL    Comment: POST TRANSFUSION SPECIMEN CRITICAL VALUE NOTED.  VALUE IS CONSISTENT WITH PREVIOUSLY REPORTED AND CALLED VALUE.    HCT 21.0 (L) 39.0 - 52.0 %   MCV 84.3 78.0 - 100.0 fL   MCH 27.3 26.0 - 34.0 pg   MCHC 32.4 30.0 - 36.0 g/dL   RDW 15.1 11.5 - 15.5 %   Platelets 175 150 - 400 K/uL  Prepare RBC     Status: None   Collection Time: 01/01/16 10:25 AM  Result Value Ref Range   Order Confirmation ORDER PROCESSED BY BLOOD BANK   Hemoglobin and hematocrit, blood     Status: Abnormal   Collection Time: 01/01/16  1:09 PM  Result Value Ref Range   Hemoglobin 6.8 (LL) 13.0 - 17.0 g/dL    Comment: REPEATED TO VERIFY CRITICAL RESULT CALLED TO, READ BACK BY AND VERIFIED WITH: V.WOODSON,RN 01/01/16 _0  BY V.WILKINS    HCT 21.4 (L) 39.0 - 52.0 %   No results found.  Assessment:  1 Acute renal allograft rejection, dialysis dependent & receiving antirejection medication 2 ABLA due to GI bleed 3 Tunneled dialysis catheter Plan: 1 HD today with blood transfusion 2 per GI Marvene Strohm C 01/01/2016, 2:11 PM

## 2016-01-01 NOTE — Progress Notes (Signed)
[facility] PROGRESS NOTE  DAEGEN BEACHLER [id number] DOB: 11-May-1976 DOA: 12/31/2015 PCP: Thressa Sheller, MD  Assessment/Plan: 40 y.o. 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 Initial evaluation reveals acute blood loss anemia with a hemoglobin of 5.8 and +FOBT.  1. Acute blood loss anemia due to GI bleeding. Patient on high doses of steroids as transplant patient. He denies any NSAID use. Hg 5.8 on admission. guac positive stool.  -TFsed 2 units. Repeat Hg 6.8. We will TF 2 more unit with HD today. Cont monitor TF prn  -GI plans endoscopy AM. Cont Protonix  2. ESRD on HD. h/o Renal transplant patient/chronic kidney disease. 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 See discharge summary dated December 2016. -cont HD.  Consulted nephrology  3. Hypertension. Controlled.Continue home medications  Code Status: full Family Communication: d/w patient, RN (indicate person spoken with, relationship, and if by phone, the number) Disposition Plan: home pend endoscopy     Consultants:  GI  nephrology Procedures:  Pend endoscopy   Antibiotics:  none (indicate start date, and stop date if known)  HPI/Subjective: Alert. No distress   Objective: Filed Vitals:   01/01/16 0605 01/01/16 0852  BP: 143/75 140/69  Pulse: 73 78  Temp: 98.3 F (36.8 C) 98.7 F (37.1 C)  Resp: 18 18    Intake/Output Summary (Last 24 hours) at 01/01/16 1008 Last data filed at 01/01/16 0600  Gross per 24 hour  Intake    910 ml  Output    200 ml  Net    710 ml   Filed Weights   12/31/15 1452  Weight: 105.235 kg (232 lb)    Exam:   General:  Alert   Cardiovascular: s1,s2 rrr  Respiratory: CAT BL   Abdomen: soft, nt,nd   Musculoskeletal: no leg edema    Data Reviewed: Basic Metabolic  Panel:  Recent Labs Lab 12/31/15 1452 01/01/16 0547  NA 140 140  K 5.6* 5.1  CL 106 109  CO2 25 21*  GLUCOSE 120* 81  BUN 52* 65*  CREATININE 10.81* 12.52*  CALCIUM 8.7* 9.1   Liver Function Tests:  Recent Labs Lab 12/31/15 1452 01/01/16 0547  AST 16 13*  ALT 17 15*  ALKPHOS 40 38  BILITOT 0.5 0.9  PROT 5.5* 5.4*  ALBUMIN 2.9* 2.9*   No results for input(s): LIPASE, AMYLASE in the last 168 hours. No results for input(s): AMMONIA in the last 168 hours. CBC:  Recent Labs Lab 12/31/15 1452 01/01/16 0547  WBC 5.0 7.3  HGB 5.8* 6.8*  HCT 19.8* 21.0*  MCV 87.2 84.3  PLT 195 175   Cardiac Enzymes: No results for input(s): CKTOTAL, CKMB, CKMBINDEX, TROPONINI in the last 168 hours. BNP (last 3 results) No results for input(s): BNP in the last 8760 hours.  ProBNP (last 3 results) No results for input(s): PROBNP in the last 8760 hours.  CBG: No results for input(s): GLUCAP in the last 168 hours.  Recent Results (from the past 240 hour(s))  MRSA PCR Screening     Status: None   Collection Time: 01/01/16 12:14 AM  Result Value Ref Range Status   MRSA by PCR NEGATIVE NEGATIVE Final    Comment:        The GeneXpert MRSA Assay (FDA approved for NASAL specimens only), is one component of a comprehensive MRSA colonization surveillance  program. It is not intended to diagnose MRSA infection nor to guide or monitor treatment for MRSA infections.      Studies: No results found.  Scheduled Meds: . sodium chloride   Intravenous Once  . amLODipine  10 mg Oral QHS  . calcium carbonate  2,000 mg Oral TID WC  . fluconazole  50 mg Oral Daily  . labetalol  600 mg Oral BID  . mycophenolate  360 mg Oral BID  . pantoprazole (PROTONIX) IV  40 mg Intravenous Q24H  . predniSONE  20 mg Oral Q breakfast  . sodium bicarbonate  1,300 mg Oral Daily  . sodium bicarbonate  650 mg Oral QHS  . sodium chloride  3 mL Intravenous Q12H  . sodium chloride  3 mL Intravenous Q12H   . sulfamethoxazole-trimethoprim  1 tablet Oral Q M,W,F-2000  . tacrolimus  5 mg Oral BID  . valGANciclovir  450 mg Oral Q M,W,F-2000   Continuous Infusions:   Principal Problem:   Acute blood loss anemia Active Problems:   Renal transplant recipient   Hyperkalemia   Anemia   HTN (hypertension)   CKD (chronic kidney disease)   Hematochezia    Time spent: >35 minutes     Kinnie Feil  [facility] Pager [phone]. If 7PM-7AM, please contact night-coverage at [email or web address], password [facility] 01/01/2016, 10:08 AM  LOS: 1 day

## 2016-01-01 NOTE — Progress Notes (Signed)
Patient refusing SCDs, reviewed the importance of the SCDs but pt says he is active, up and walking in the room. MD made aware. 

## 2016-01-01 NOTE — Progress Notes (Signed)
Initial Nutrition Assessment  DOCUMENTATION CODES:    Obesity unspecified  INTERVENTION:  Provide Boost Breeze po TID, each supplement provides 250 kcal and 9 grams of protein.  Encourage adequate PO intake.   NUTRITION DIAGNOSIS:   Increased nutrient needs related to chronic illness as evidenced by estimated needs.  GOAL:   Patient will meet greater than or equal to 90% of their needs  MONITOR:   PO intake, Supplement acceptance, Diet advancement, Weight trends, Labs, I & O's  REASON FOR ASSESSMENT:   Malnutrition Screening Tool    ASSESSMENT:   40 y.o. 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 Initial evaluation reveals acute blood loss anemia with a hemoglobin of 5.8 and +FOBT.  Pt advanced to a clear liquid diet. Pt reports having an altered taste perception which has been affecting his po intake at meals that has been ongoing over the past 3 weeks. He reports however still consume at least 3 meals a day. Usual body weight reported to be ~255 lbs. Per Epic weight records, pt with a 9% weight loss in 1 month. Pt is agreeable to nutritional supplements to aid in caloric and protein needs. RD to order.   Pt with no observed significant fat or muscle mass loss.  Labs and medications reviewed.   Diet Order:  Diet clear liquid Room service appropriate?: Yes; Fluid consistency:: Thin Diet NPO time specified  Skin:  Reviewed, no issues  Last BM:  1/17  Height:   Ht Readings from Last 1 Encounters:  12/31/15 5\' 11"  (1.803 m)    Weight:   Wt Readings from Last 1 Encounters:  01/01/16 227 lb 4.7 oz (103.1 kg)    Ideal Body Weight:  78.18 kg  BMI:  Body mass index is 31.72 kg/(m^2).  Estimated Nutritional Needs:   Kcal:  2100-2300  Protein:  115-135 grams  Fluid:  Per MD  EDUCATION NEEDS:   No education needs  identified at this time  Corrin Parker, MS, RD, LDN Pager # [phone] After hours/ weekend pager # [phone]

## 2016-01-02 ENCOUNTER — Inpatient Hospital Stay (HOSPITAL_COMMUNITY): Payer: Medicaid Other | Admitting: Anesthesiology

## 2016-01-02 ENCOUNTER — Encounter (HOSPITAL_COMMUNITY): Payer: Self-pay

## 2016-01-02 ENCOUNTER — Encounter (HOSPITAL_COMMUNITY)
Admission: EM | Disposition: A | Discharge status: Home / Self Care | Payer: Self-pay | Source: Home / Self Care | Attending: Internal Medicine

## 2016-01-02 DIAGNOSIS — K921 Melena: Secondary | ICD-10-CM

## 2016-01-02 HISTORY — PX: COLONOSCOPY: SHX5424

## 2016-01-02 HISTORY — PX: ESOPHAGOGASTRODUODENOSCOPY: SHX5428

## 2016-01-02 LAB — TYPE AND SCREEN
ABO/RH(D): A POS
Antibody Screen: NEGATIVE
Unit division: 0
Unit division: 0
Unit division: 0
Unit division: 0

## 2016-01-02 LAB — HEMOGLOBIN AND HEMATOCRIT, BLOOD
HCT: 27.4 % — ABNORMAL LOW (ref 39.0–52.0)
HCT: 28.3 % — ABNORMAL LOW (ref 39.0–52.0)
Hemoglobin: 8.8 g/dL — ABNORMAL LOW (ref 13.0–17.0)
Hemoglobin: 9.1 g/dL — ABNORMAL LOW (ref 13.0–17.0)

## 2016-01-02 LAB — RENAL FUNCTION PANEL
Albumin: 3.4 g/dL — ABNORMAL LOW (ref 3.5–5.0)
Anion gap: 11 (ref 5–15)
BUN: 33 mg/dL — ABNORMAL HIGH (ref 6–20)
CO2: 25 mmol/L (ref 22–32)
Calcium: 9.4 mg/dL (ref 8.9–10.3)
Chloride: 99 mmol/L — ABNORMAL LOW (ref 101–111)
Creatinine, Ser: 8.51 mg/dL — ABNORMAL HIGH (ref 0.61–1.24)
GFR calc Af Amer: 8 mL/min — ABNORMAL LOW (ref >60)
GFR calc non Af Amer: 7 mL/min — ABNORMAL LOW (ref >60)
Glucose, Bld: 90 mg/dL (ref 65–99)
Phosphorus: 5.6 mg/dL — ABNORMAL HIGH (ref 2.5–4.6)
Potassium: 3.9 mmol/L (ref 3.5–5.1)
Sodium: 135 mmol/L (ref 135–145)

## 2016-01-02 LAB — CBC
HCT: 21 % — ABNORMAL LOW (ref 39.0–52.0)
HCT: 27.6 % — ABNORMAL LOW (ref 39.0–52.0)
Hemoglobin: 6.8 g/dL — CL (ref 13.0–17.0)
Hemoglobin: 9.2 g/dL — ABNORMAL LOW (ref 13.0–17.0)
MCH: 27.3 pg (ref 26.0–34.0)
MCH: 27.8 pg (ref 26.0–34.0)
MCHC: 32.4 g/dL (ref 30.0–36.0)
MCHC: 33.3 g/dL (ref 30.0–36.0)
MCV: 84.3 fL (ref 78.0–100.0)
MCV: 83.4 fL (ref 78.0–100.0)
Platelets: 175 10*3/uL (ref 150–400)
Platelets: 157 10*3/uL (ref 150–400)
RBC: 2.49 MIL/uL — ABNORMAL LOW (ref 4.22–5.81)
RBC: 3.31 MIL/uL — ABNORMAL LOW (ref 4.22–5.81)
RDW: 15.1 % (ref 11.5–15.5)
RDW: 15.5 % (ref 11.5–15.5)
WBC: 7.3 10*3/uL (ref 4.0–10.5)
WBC: 8 10*3/uL (ref 4.0–10.5)

## 2016-01-02 LAB — HEPATITIS B SURFACE ANTIGEN: Hepatitis B Surface Ag: NEGATIVE

## 2016-01-02 SURGERY — COLONOSCOPY
Anesthesia: Monitor Anesthesia Care

## 2016-01-02 MED ORDER — LIDOCAINE HCL (CARDIAC) 20 MG/ML IV SOLN
INTRAVENOUS | Status: DC | PRN
Start: 2016-01-02 — End: 2016-01-02
  Administered 2016-01-02: 80 mg via INTRAVENOUS

## 2016-01-02 MED ORDER — BUTAMBEN-TETRACAINE-BENZOCAINE 2-2-14 % EX AERO
INHALATION_SPRAY | CUTANEOUS | Status: DC | PRN
  Administered 2016-01-02: 2 via TOPICAL

## 2016-01-02 MED ORDER — RENA-VITE PO TABS
1.0000 | ORAL_TABLET | Freq: Every day | ORAL | Status: DC
  Administered 2016-01-02 – 2016-01-03 (×2): 1 via ORAL
  Filled 2016-01-02 (×2): qty 1

## 2016-01-02 MED ORDER — PROPOFOL 500 MG/50ML IV EMUL
INTRAVENOUS | Status: DC | PRN
Start: 2016-01-02 — End: 2016-01-02
  Administered 2016-01-02: 14:00:00 via INTRAVENOUS
  Administered 2016-01-02: 100 ug/kg/min via INTRAVENOUS

## 2016-01-02 MED ORDER — PROPOFOL 10 MG/ML IV BOLUS
INTRAVENOUS | Status: DC | PRN
  Administered 2016-01-02 (×2): 20 mg via INTRAVENOUS

## 2016-01-02 MED ORDER — MIDAZOLAM HCL 2 MG/2ML IJ SOLN
INTRAMUSCULAR | Status: DC | PRN
  Administered 2016-01-02: 2 mg via INTRAVENOUS

## 2016-01-02 MED ORDER — DARBEPOETIN ALFA 40 MCG/0.4ML IJ SOSY
40.0000 ug | PREFILLED_SYRINGE | INTRAMUSCULAR | Status: DC
  Administered 2016-01-03: 40 ug via INTRAVENOUS
  Filled 2016-01-02: qty 0.4

## 2016-01-02 NOTE — Transfer of Care (Signed)
Immediate Anesthesia Transfer of Care Note  Patient: Tim Baker  Procedure(s) Performed: Procedure(s): COLONOSCOPY (N/A) ESOPHAGOGASTRODUODENOSCOPY (EGD) (N/A)  Patient Location: Endoscopy Unit  Anesthesia Type:MAC  Level of Consciousness: sedated and patient cooperative  Airway & Oxygen Therapy: Patient Spontanous Breathing and Patient connected to nasal cannula oxygen  Post-op Assessment: Report given to RN and Post -op Vital signs reviewed and stable  Post vital signs: Reviewed  Last Vitals:  Filed Vitals:   01/02/16 1016 01/02/16 1302  BP: 153/88 185/98  Pulse: 58 64  Temp: 36.8 C 36.7 C  Resp: 18 19    Complications: No apparent anesthesia complications

## 2016-01-02 NOTE — Anesthesia Procedure Notes (Signed)
Procedure Name: MAC Date/Time: 01/02/2016 2:10 PM Performed by: Jenne Campus Pre-anesthesia Checklist: Emergency Drugs available, Suction available, Patient being monitored and Patient identified Patient Re-evaluated:Patient Re-evaluated prior to inductionOxygen Delivery Method: Nasal cannula

## 2016-01-02 NOTE — Progress Notes (Signed)
[facility] Progress Note  Assessment/Plan: 1. Acute renal allograft rejection hx renal tx 2013 s/p 7 THYMO doses at [facility]: Lambert Rhonda W- had been off IS meds x several weeks due to lack of money- dialysis dependent since 12/04/15 Cr ~11 without significant improvement continue antirejection meds; no heparin HD; Next HD Friday-  2. Acute blood loss anemia - s/p 4 units PRBCs - didn't have ESA on outpt med list; Hgb had been trending down with low MCVs prior to admission; Hgb now 9.2- don't see that renal function is reversing - add ESA 4. Secondary hyperparathyroidism - tums ac; no recent iPTH- will check 5. HTN/volume - norvasc 10 and labetolol 600 bid plus volume control; net UF 2 L  -post wt 101.2 - volume status - good this am; would keep EDW about 101.5 or 102 6. Nutrition - npo for upper and lower endo today  Myriam Jacobson, PA-C [place] 01/02/2016,8:22 AM  LOS: 2 days    Renal Attending: For colonsoscopy today. Hemodialysis Friday. Agree with note above. Ermelinda Eckert C   Subjective:   Having yellowish stools with small blood clots  Objective Filed Vitals:   01/01/16 1700 01/01/16 1830 01/01/16 2100 01/02/16 0446  BP: 138/77 140/79 144/73 143/82  Pulse: 60 64 67 65  Temp:  98.1 F (36.7 C) 98.6 F (37 C) 98.2 F (36.8 C)  TempSrc:  Oral Oral   Resp: 19 20 20 20   Height:      Weight:    101.243 kg (223 lb 3.2 oz)  SpO2:  100% 100% 100%   Physical Exam General: NAD in bed Heart: RRR Lungs: no rales Abdomen: soft NT Extremities: no LE edema Dialysis Access: right IJ , clotted LLE AVF  Dialysis Orders: [facility] - pre HD wts 105.2 113 and 105.5 1/16 - NA citrate used for catheter block at [facility] prior to admission - not clear why; no ESA or VDRA  Additional Objective Labs: Basic Metabolic Panel:  Recent Labs Lab 12/31/15 1452 01/01/16 0547  NA 140 140  K 5.6* 5.1  CL 106 109  CO2 25 21*  GLUCOSE 120* 81  BUN 52* 65*   CREATININE 10.81* 12.52*  CALCIUM 8.7* 9.1   Liver Function Tests:  Recent Labs Lab 12/31/15 1452 01/01/16 0547  AST 16 13*  ALT 17 15*  ALKPHOS 40 38  BILITOT 0.5 0.9  PROT 5.5* 5.4*  ALBUMIN 2.9* 2.9*   CBC:  Recent Labs Lab 12/31/15 1452 01/01/16 0547 01/01/16 1309 01/01/16 2135 01/02/16 0532  WBC 5.0 7.3  --   --  8.0  HGB 5.8* 6.8* 6.8* 9.1* 9.2*  HCT 19.8* 21.0* 21.4* 27.6* 27.6*  MCV 87.2 84.3  --   --  83.4  PLT 195 175  --   --  157  Medications:   . sodium chloride   Intravenous Once  . sodium chloride   Intravenous Once  . amLODipine  10 mg Oral QHS  . anticoagulant sodium citrate  5 mL Intravenous Once  . calcium carbonate  2,000 mg Oral TID WC  . feeding supplement  1 Container Oral TID BM  . fluconazole  50 mg Oral Daily  . labetalol  600 mg Oral BID  . mycophenolate  360 mg Oral BID  . pantoprazole (PROTONIX) IV  40 mg Intravenous Q24H  . predniSONE  20 mg Oral Q breakfast  . sodium bicarbonate  1,300 mg Oral Daily  . sodium bicarbonate  650 mg Oral QHS  .  sodium chloride  3 mL Intravenous Q12H  . sodium chloride  3 mL Intravenous Q12H  . sulfamethoxazole-trimethoprim  1 tablet Oral Q M,W,F-2000  . tacrolimus  5 mg Oral BID  . valGANciclovir  450 mg Oral Q M,W,F-2000

## 2016-01-02 NOTE — Op Note (Signed)
[facility] [place], [address]   COLONOSCOPY PROCEDURE REPORT  PATIENT: Tim Baker, Tim Baker  MR#: [id number] BIRTHDATE: 25-Jun-1976 , 39  yrs. old GENDER: male ENDOSCOPIST: Carol Ada, MD REFERRED BY: PROCEDURE DATE:  01/28/16 PROCEDURE:   Colonoscopy with control of bleeding ASA CLASS:   Class III INDICATIONS:Hematochezia and anemia MEDICATIONS: Monitored anesthesia care  DESCRIPTION OF PROCEDURE:   After the risks and benefits and of the procedure were explained, informed consent was obtained.  revealed no abnormalities of the rectum.    The Pentax Ultra Slim [id number] endoscope was introduced through the anus and advanced to the terminal ileum which was intubated for a short distance .  The quality of the prep was adequate .  The instrument was then slowly withdrawn as the colon was fully examined. Estimated blood loss is zero unless otherwise noted in this procedure report.   FINDINGS: In the colon there was dark liquid stool suggestive of bleeding.  During the insertion of the colonoscope, at the hepatic flexure, there was a collection of AVMs.  There was suspicion that some bleeding occurred in this area.  In the cecum it was difficult to discern if there were other AVMs versus scope trauma.  All suspicious sites were ablated with APC.  Right sided AVMs are commonly seen in patients on hemodialysis.  No evidence of polyps, masses, inflammation, ulcerations, or erosions.     Retroflexed views revealed no abnormalities.     The scope was then withdrawn from the patient and the procedure completed.  WITHDRAWAL TIME: 21 minutes  COMPLICATIONS: There were no immediate complications. ENDOSCOPIC IMPRESSION: 1) Hepatic flexure AVMs s/p APC. 2) ? Cecal AVMs.  RECOMMENDATIONS: 1) Follow HGB and transfuse as necessary.  REPEAT EXAM:  cc: _______________________________ eSignedCarol Ada, MD 01/28/2016 3:10 PM  CPT  CODES: ICD CODES:

## 2016-01-02 NOTE — Op Note (Signed)
[facility] [place], [id number]   ENDOSCOPY PROCEDURE REPORT  PATIENT: Tim Baker, Tim Baker  MR#: [id number] BIRTHDATE: 05-Jan-1976 , 39  yrs. old GENDER: male ENDOSCOPIST:Kais Monje Benson Norway, MD REFERRED BY: PROCEDURE DATE:  01/24/16 PROCEDURE:   EGD w/ biopsy ASA CLASS:    Class III INDICATIONS: Anemia MEDICATION: Monitored anesthesia care TOPICAL ANESTHETIC:   none  DESCRIPTION OF PROCEDURE:   After the risks and benefits of the procedure were explained, informed consent was obtained.  The endoscope was introduced through the mouth  and advanced to the second portion of the duodenum .  The instrument was slowly withdrawn as the mucosa was fully examined. Estimated blood loss is zero unless otherwise noted in this procedure report.   FINDINGS: An LA Grade C esophagitis was identified in the distal esohpagus.  In the antrum a few patches of erythema was found and biopsied with the cold forceps.  In the disal duodenal bulb and the transition to D2 a duodenitis was found.  The mucosa was mildly friable.  No overt evidence of an ulcer in this area.          The scope was then withdrawn from the patient and the procedure completed.  COMPLICATIONS: There were no immediate complications.  ENDOSCOPIC IMPRESSION: 1) LA Grade C esophagitis. 2) Mild antral gastritis. 3) Duodenitis.  RECOMMENDATIONS: 1) Follow up biopsies. 2) PPI QD. _______________________________ Lorrin MaisCarol Ada, MD 01/24/2016 3:04 PM    cc: CPT CODES: ICD CODES:

## 2016-01-02 NOTE — Progress Notes (Signed)
[facility] PROGRESS NOTE  Tim Baker [id number] DOB: 03-15-1976 DOA: 12/31/2015 PCP: Thressa Sheller, MD  Assessment/Plan: 40 y.o. 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 Initial evaluation reveals acute blood loss anemia with a hemoglobin of 5.8 and +FOBT.  1. Acute blood loss anemia due to GI bleeding. Patient on high doses of steroids as transplant patient. He denies any NSAID use. Hg 5.8 on admission. guac positive stool.  -TFsed 4 units. Repeat Hg is stable. Cont monitor TF prn  -scheduled endoscopy 1/19 per GI. Cont Protonix  2. ESRD on HD. h/o Renal transplant patient/chronic kidney disease. 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 See discharge summary dated December 2016. -cont HD.  appreciate  nephrology  3. Hypertension. Controlled.Continue home medications  Code Status: full Family Communication: d/w patient, RN (indicate person spoken with, relationship, and if by phone, the number) Disposition Plan: home pend endoscopy     Consultants:  GI  nephrology Procedures:  Pend endoscopy   Antibiotics:  none (indicate start date, and stop date if known)  HPI/Subjective: Alert. No distress   Objective: Filed Vitals:   01/01/16 2100 01/02/16 0446  BP: 144/73 143/82  Pulse: 67 65  Temp: 98.6 F (37 C) 98.2 F (36.8 C)  Resp: 20 20    Intake/Output Summary (Last 24 hours) at 01/02/16 0921 Last data filed at 01/01/16 2205  Gross per 24 hour  Intake    862 ml  Output   1800 ml  Net   -938 ml   Filed Weights   12/31/15 1452 01/01/16 1244 01/02/16 0446  Weight: 105.235 kg (232 lb) 103.1 kg (227 lb 4.7 oz) 101.243 kg (223 lb 3.2 oz)    Exam:   General:  Alert   Cardiovascular: s1,s2 rrr  Respiratory: CAT BL   Abdomen: soft, nt,nd    Musculoskeletal: no leg edema    Data Reviewed: Basic Metabolic Panel:  Recent Labs Lab 12/31/15 1452 01/01/16 0547  NA 140 140  K 5.6* 5.1  CL 106 109  CO2 25 21*  GLUCOSE 120* 81  BUN 52* 65*  CREATININE 10.81* 12.52*  CALCIUM 8.7* 9.1   Liver Function Tests:  Recent Labs Lab 12/31/15 1452 01/01/16 0547  AST 16 13*  ALT 17 15*  ALKPHOS 40 38  BILITOT 0.5 0.9  PROT 5.5* 5.4*  ALBUMIN 2.9* 2.9*   No results for input(s): LIPASE, AMYLASE in the last 168 hours. No results for input(s): AMMONIA in the last 168 hours. CBC:  Recent Labs Lab 12/31/15 1452 01/01/16 0547 01/01/16 1309 01/01/16 2135 01/02/16 0532  WBC 5.0 7.3  --   --  8.0  HGB 5.8* 6.8* 6.8* 9.1* 9.2*  HCT 19.8* 21.0* 21.4* 27.6* 27.6*  MCV 87.2 84.3  --   --  83.4  PLT 195 175  --   --  157   Cardiac Enzymes: No results for input(s): CKTOTAL, CKMB, CKMBINDEX, TROPONINI in the last 168 hours. BNP (last 3 results) No results for input(s): BNP in the last 8760 hours.  ProBNP (last 3 results) No results for input(s): PROBNP in the last 8760 hours.  CBG: No results for input(s): GLUCAP in the last 168 hours.  Recent Results (from the past 240 hour(s))  MRSA PCR Screening     Status: None   Collection Time: 01/01/16 12:14 AM  Result Value  Ref Range Status   MRSA by PCR NEGATIVE NEGATIVE Final    Comment:        The GeneXpert MRSA Assay (FDA approved for NASAL specimens only), is one component of a comprehensive MRSA colonization surveillance program. It is not intended to diagnose MRSA infection nor to guide or monitor treatment for MRSA infections.      Studies: No results found.  Scheduled Meds: . sodium chloride   Intravenous Once  . sodium chloride   Intravenous Once  . amLODipine  10 mg Oral QHS  . anticoagulant sodium citrate  5 mL Intravenous Once  . calcium carbonate  2,000 mg Oral TID WC  . [START ON 01/03/2016] darbepoetin (ARANESP) injection - DIALYSIS  40 mcg  Intravenous Q Fri-HD  . feeding supplement  1 Container Oral TID BM  . fluconazole  50 mg Oral Daily  . labetalol  600 mg Oral BID  . multivitamin  1 tablet Oral QHS  . mycophenolate  360 mg Oral BID  . pantoprazole (PROTONIX) IV  40 mg Intravenous Q24H  . predniSONE  20 mg Oral Q breakfast  . sodium bicarbonate  1,300 mg Oral Daily  . sodium bicarbonate  650 mg Oral QHS  . sodium chloride  3 mL Intravenous Q12H  . sodium chloride  3 mL Intravenous Q12H  . sulfamethoxazole-trimethoprim  1 tablet Oral Q M,W,F-2000  . tacrolimus  5 mg Oral BID  . valGANciclovir  450 mg Oral Q M,W,F-2000   Continuous Infusions:   Principal Problem:   Acute blood loss anemia Active Problems:   Renal transplant recipient   Hyperkalemia   Anemia   HTN (hypertension)   CKD (chronic kidney disease)   Hematochezia    Time spent: >35 minutes     Kinnie Feil  [facility] Pager [phone]. If 7PM-7AM, please contact night-coverage at [email or web address], password [facility] 01/02/2016, 9:21 AM  LOS: 2 days

## 2016-01-02 NOTE — Progress Notes (Signed)
Utilization Review Completed.Donne Anon T1/19/2017

## 2016-01-02 NOTE — Anesthesia Preprocedure Evaluation (Addendum)
Anesthesia Evaluation  Patient identified by MRN, date of birth, ID band Patient awake    Reviewed: Allergy & Precautions, H&P , Patient's Chart, lab work & pertinent test results, reviewed documented beta blocker date and time   Airway Mallampati: II  TM Distance: >3 FB Neck ROM: full    Dental no notable dental hx.    Pulmonary    Pulmonary exam normal breath sounds clear to auscultation       Cardiovascular hypertension, Pt. on medications  Rhythm:regular Rate:Normal     Neuro/Psych    GI/Hepatic   Endo/Other    Renal/GU DialysisRenal disease     Musculoskeletal   Abdominal   Peds  Hematology  (+) anemia ,   Anesthesia Other Findings        Hypertension.... Well controlled   Anemia   9ish today   H/O kidney transplant...failed    Hyperkalemia 3.9 today  Hematochezia    Heart murmur  "slight one" History of blood transfusion   GERD (gastroesophageal reflux disease)   ESRD (end stage renal disease) on dialysis ([place])  "MWF; [facility]" (12/31/2015)     Reproductive/Obstetrics                           Anesthesia Physical Anesthesia Plan  ASA: III  Anesthesia Plan: MAC   Post-op Pain Management:    Induction: Intravenous  Airway Management Planned: Mask and Natural Airway  Additional Equipment:   Intra-op Plan:   Post-operative Plan:   Informed Consent: I have reviewed the patients History and Physical, chart, labs and discussed the procedure including the risks, benefits and alternatives for the proposed anesthesia with the patient or authorized representative who has indicated his/her understanding and acceptance.   Dental Advisory Given  Plan Discussed with: CRNA and Surgeon  Anesthesia Plan Comments: (Discussed sedation and potential to need to place airway or ETT if warranted by clinical changes intra-operatively. We will start procedure as MAC.)         Anesthesia Quick Evaluation

## 2016-01-03 ENCOUNTER — Encounter (HOSPITAL_COMMUNITY): Payer: Self-pay | Admitting: Gastroenterology

## 2016-01-03 LAB — CBC
HCT: 26.2 % — ABNORMAL LOW (ref 39.0–52.0)
HCT: 24.9 % — ABNORMAL LOW (ref 39.0–52.0)
Hemoglobin: 8.9 g/dL — ABNORMAL LOW (ref 13.0–17.0)
Hemoglobin: 8.3 g/dL — ABNORMAL LOW (ref 13.0–17.0)
MCH: 28.2 pg (ref 26.0–34.0)
MCH: 27.6 pg (ref 26.0–34.0)
MCHC: 34 g/dL (ref 30.0–36.0)
MCHC: 33.3 g/dL (ref 30.0–36.0)
MCV: 82.9 fL (ref 78.0–100.0)
MCV: 82.7 fL (ref 78.0–100.0)
Platelets: 149 10*3/uL — ABNORMAL LOW (ref 150–400)
Platelets: 133 10*3/uL — ABNORMAL LOW (ref 150–400)
RBC: 3.16 MIL/uL — ABNORMAL LOW (ref 4.22–5.81)
RBC: 3.01 MIL/uL — ABNORMAL LOW (ref 4.22–5.81)
RDW: 15.1 % (ref 11.5–15.5)
RDW: 15 % (ref 11.5–15.5)
WBC: 11.6 10*3/uL — ABNORMAL HIGH (ref 4.0–10.5)
WBC: 10.9 10*3/uL — ABNORMAL HIGH (ref 4.0–10.5)

## 2016-01-03 LAB — RENAL FUNCTION PANEL
Albumin: 2.9 g/dL — ABNORMAL LOW (ref 3.5–5.0)
Anion gap: 16 — ABNORMAL HIGH (ref 5–15)
BUN: 46 mg/dL — ABNORMAL HIGH (ref 6–20)
CO2: 22 mmol/L (ref 22–32)
Calcium: 8.5 mg/dL — ABNORMAL LOW (ref 8.9–10.3)
Chloride: 97 mmol/L — ABNORMAL LOW (ref 101–111)
Creatinine, Ser: 11.5 mg/dL — ABNORMAL HIGH (ref 0.61–1.24)
GFR calc Af Amer: 6 mL/min — ABNORMAL LOW (ref >60)
GFR calc non Af Amer: 5 mL/min — ABNORMAL LOW (ref >60)
Glucose, Bld: 86 mg/dL (ref 65–99)
Phosphorus: 5.3 mg/dL — ABNORMAL HIGH (ref 2.5–4.6)
Potassium: 3.7 mmol/L (ref 3.5–5.1)
Sodium: 135 mmol/L (ref 135–145)

## 2016-01-03 MED ORDER — FLUCONAZOLE 100 MG PO TABS
50.0000 mg | ORAL_TABLET | Freq: Every day | ORAL | Status: DC
  Administered 2016-01-04: 50 mg via ORAL
  Filled 2016-01-03: qty 1

## 2016-01-03 MED ORDER — ALTEPLASE 2 MG IJ SOLR
2.0000 mg | Freq: Once | INTRAMUSCULAR | Status: DC | PRN
  Filled 2016-01-03: qty 2

## 2016-01-03 MED ORDER — DARBEPOETIN ALFA 40 MCG/0.4ML IJ SOSY
PREFILLED_SYRINGE | INTRAMUSCULAR | Status: AC
  Filled 2016-01-03: qty 0.4

## 2016-01-03 MED ORDER — SODIUM CHLORIDE 0.9 % IV SOLN: 100.0000 mL | INTRAVENOUS | Status: DC | PRN

## 2016-01-03 MED ORDER — PANTOPRAZOLE SODIUM 40 MG PO TBEC
40.0000 mg | DELAYED_RELEASE_TABLET | Freq: Every day | ORAL | Status: DC
  Administered 2016-01-03: 40 mg via ORAL
  Filled 2016-01-03 (×2): qty 1

## 2016-01-03 MED ORDER — LIDOCAINE HCL (PF) 1 % IJ SOLN: 5.0000 mL | INTRAMUSCULAR | Status: DC | PRN

## 2016-01-03 MED ORDER — PENTAFLUOROPROP-TETRAFLUOROETH EX AERO: 1.0000 "application " | INHALATION_SPRAY | CUTANEOUS | Status: DC | PRN

## 2016-01-03 MED ORDER — HEPARIN SODIUM (PORCINE) 1000 UNIT/ML DIALYSIS: 1000.0000 [IU] | INTRAMUSCULAR | Status: DC | PRN

## 2016-01-03 MED ORDER — LIDOCAINE-PRILOCAINE 2.5-2.5 % EX CREA
1.0000 | TOPICAL_CREAM | CUTANEOUS | Status: DC | PRN
Start: 2016-01-03 — End: 2016-01-03
  Filled 2016-01-03: qty 5

## 2016-01-03 NOTE — Anesthesia Postprocedure Evaluation (Signed)
Anesthesia Post Note  Patient: Tim Baker  Procedure(s) Performed: Procedure(s) (LRB): COLONOSCOPY (N/A) ESOPHAGOGASTRODUODENOSCOPY (EGD) (N/A)  Patient location during evaluation: Endoscopy Anesthesia Type: MAC Level of consciousness: awake and alert Pain management: pain level controlled Vital Signs Assessment: post-procedure vital signs reviewed and stable Respiratory status: spontaneous breathing, nonlabored ventilation, respiratory function stable and patient connected to nasal cannula oxygen Cardiovascular status: stable and blood pressure returned to baseline Anesthetic complications: no    Last Vitals:  Filed Vitals:   01/02/16 1700 01/02/16 2115  BP: 138/87 146/84  Pulse: 60 68  Temp: 36.9 C 37.3 C  Resp: 18 18    Last Pain:  Filed Vitals:   01/02/16 2126  PainSc: 0-No pain                 Montez Hageman

## 2016-01-03 NOTE — Progress Notes (Signed)
Subjective: No complaints.  No reports of melena or hematochezia.  Objective: Vital signs in last 24 hours: Temp:  [97.3 F (36.3 C)-99.4 F (37.4 C)] 98.5 F (36.9 C) (01/20 0711) Pulse Rate:  [58-68] 64 (01/20 0730) Resp:  [18-20] 18 (01/20 0716) BP: (134-185)/(63-98) 141/79 mmHg (01/20 0730) SpO2:  [97 %-100 %] 99 % (01/20 0711) Weight:  [101.4 kg (223 lb 8.7 oz)-101.5 kg (223 lb 12.3 oz)] 101.4 kg (223 lb 8.7 oz) (01/20 0711) Last BM Date: 01/02/16  Intake/Output from previous day: 01/19 0701 - 01/20 0700 In: 1220 [P.O.:720; I.V.:500] Out: 500 [Urine:500] Intake/Output this shift:    General appearance: alert and no distress GI: soft, non-tender; bowel sounds normal; no masses,  no organomegaly  Lab Results:  Recent Labs  01/02/16 0532  01/02/16 2155 01/03/16 0627 01/03/16 0728  WBC 8.0  --   --  11.6* 10.9*  HGB 9.2*  < > 9.1* 8.9* 8.3*  HCT 27.6*  < > 28.3* 26.2* 24.9*  PLT 157  --   --  149* 133*  < > = values in this interval not displayed. BMET  Recent Labs  01/01/16 0547 01/02/16 1000 01/03/16 0730  NA 140 135 135  K 5.1 3.9 3.7  CL 109 99* 97*  CO2 21* 25 22  GLUCOSE 81 90 86  BUN 65* 33* 46*  CREATININE 12.52* 8.51* 11.50*  CALCIUM 9.1 9.4 8.5*   LFT  Recent Labs  01/01/16 0547  01/03/16 0730  PROT 5.4*  --   --   ALBUMIN 2.9*  < > 2.9*  AST 13*  --   --   ALT 15*  --   --   ALKPHOS 38  --   --   BILITOT 0.9  --   --   < > = values in this interval not displayed. PT/INR No results for input(s): LABPROT, INR in the last 72 hours. Hepatitis Panel  Recent Labs  01/01/16 1309  HEPBSAG Negative   C-Diff No results for input(s): CDIFFTOX in the last 72 hours. Fecal Lactopherrin No results for input(s): FECLLACTOFRN in the last 72 hours.  Studies/Results: No results found.  Medications:  Scheduled: . sodium chloride   Intravenous Once  . amLODipine  10 mg Oral QHS  . anticoagulant sodium citrate  5 mL Intravenous Once  .  calcium carbonate  2,000 mg Oral TID WC  . darbepoetin (ARANESP) injection - DIALYSIS  40 mcg Intravenous Q Fri-HD  . feeding supplement  1 Container Oral TID BM  . fluconazole  50 mg Oral Daily  . labetalol  600 mg Oral BID  . multivitamin  1 tablet Oral QHS  . mycophenolate  360 mg Oral BID  . pantoprazole (PROTONIX) IV  40 mg Intravenous Q24H  . predniSONE  20 mg Oral Q breakfast  . sodium bicarbonate  1,300 mg Oral Daily  . sodium bicarbonate  650 mg Oral QHS  . sodium chloride  3 mL Intravenous Q12H  . sodium chloride  3 mL Intravenous Q12H  . sulfamethoxazole-trimethoprim  1 tablet Oral Q M,W,F-2000  . tacrolimus  5 mg Oral BID  . valGANciclovir  450 mg Oral Q M,W,F-2000   Continuous:   Assessment/Plan: 1) Bleeding right sided AVMs. 2) Anemia. 3) S/p renal transplant.   The patient is well and his HGB is relatively stable.  No complaints at this time.  The AVM findings are not uncommon in hemodialysis patients.  Hopefully the ablation was successful.  Plan: 1)  Follow HGB and transfuse as necessary. 2) Signing off.   LOS: 3 days   Tim Baker D 01/03/2016, 8:00 AM

## 2016-01-03 NOTE — Procedures (Signed)
Tolerating HD.  Keeping even this morning.  BFR 400cc/min via PC. AVM bleed cauterized. Creat 11.5. Hbg 8.3. BP ok.. When discharged he will go back to [facility].  I informed him we are happy to play any role in his care he would like.  He thinks he is making more urine. Novice Vrba C

## 2016-01-03 NOTE — Progress Notes (Signed)
[facility] PROGRESS NOTE  Tim Baker [id number] DOB: 1976/08/05 DOA: 12/31/2015 PCP: Thressa Sheller, MD  Assessment/Plan: 40 y.o. 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 Initial evaluation reveals acute blood loss anemia with a hemoglobin of 5.8 and +FOBT. Patient underwent EGD/colon and found to have AVMs  1. Acute blood loss anemia due to GI bleeding. Hg 5.8 on admission. guac positive stool.  -Pt underwent EGD/colon on 1/19: LA Grade C esophagitis. Mild antral gastritis. Duodenitis. 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 -Pt TFsed 4 units. Repeat Hg is stable, but trending down. No further obvious bleeding. Cont monitor TF prn  2. ESRD on HD. h/o Renal transplant patient/chronic kidney disease. 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 See discharge summary dated December 2016. -cont HD.  appreciate  nephrology  3. Hypertension. Controlled.Continue home medications  Code Status: full Family Communication: d/w patient, RN (indicate person spoken with, relationship, and if by phone, the number) Disposition Plan: home AM     Consultants:  GI  nephrology Procedures:  Endoscopy   Antibiotics:  none (indicate start date, and stop date if known)  HPI/Subjective: Alert. No distress   Objective: Filed Vitals:   01/03/16 1100 01/03/16 1112  BP: 123/68 144/81  Pulse: 69 71  Temp:  98.8 F (37.1 C)  Resp:  16    Intake/Output Summary (Last 24 hours) at 01/03/16 1333 Last data filed at 01/03/16 1112  Gross per 24 hour  Intake   1220 ml  Output     -4 ml  Net   1224 ml   Filed Weights   01/02/16 2115 01/03/16 0711 01/03/16 1112  Weight: 101.5 kg (223 lb 12.3 oz) 101.4 kg (223 lb 8.7 oz) 100.7 kg (222 lb 0.1 oz)    Exam:   General:  Alert   Cardiovascular: s1,s2 rrr  Respiratory:  CAT BL   Abdomen: soft, nt,nd   Musculoskeletal: no leg edema    Data Reviewed: Basic Metabolic Panel:  Recent Labs Lab 12/31/15 1452 01/01/16 0547 01/02/16 1000 01/03/16 0730  NA 140 140 135 135  K 5.6* 5.1 3.9 3.7  CL 106 109 99* 97*  CO2 25 21* 25 22  GLUCOSE 120* 81 90 86  BUN 52* 65* 33* 46*  CREATININE 10.81* 12.52* 8.51* 11.50*  CALCIUM 8.7* 9.1 9.4 8.5*  PHOS  --   --  5.6* 5.3*   Liver Function Tests:  Recent Labs Lab 12/31/15 1452 01/01/16 0547 01/02/16 1000 01/03/16 0730  AST 16 13*  --   --   ALT 17 15*  --   --   ALKPHOS 40 38  --   --   BILITOT 0.5 0.9  --   --   PROT 5.5* 5.4*  --   --   ALBUMIN 2.9* 2.9* 3.4* 2.9*   No results for input(s): LIPASE, AMYLASE in the last 168 hours. No results for input(s): AMMONIA in the last 168 hours. CBC:  Recent Labs Lab 12/31/15 1452 01/01/16 0547  01/02/16 0532 01/02/16 1637 01/02/16 2155 01/03/16 0627 01/03/16 0728  WBC 5.0 7.3  --  8.0  --   --  11.6* 10.9*  HGB 5.8* 6.8*  < > 9.2* 8.8* 9.1* 8.9* 8.3*  HCT 19.8* 21.0*  < > 27.6* 27.4* 28.3* 26.2* 24.9*  MCV 87.2 84.3  --  83.4  --   --  82.9 82.7  PLT 195 175  --  157  --   --  149* 133*  < > = values in this interval not displayed. Cardiac Enzymes: No results for input(s): CKTOTAL, CKMB, CKMBINDEX, TROPONINI in the last 168 hours. BNP (last 3 results) No results for input(s): BNP in the last 8760 hours.  ProBNP (last 3 results) No results for input(s): PROBNP in the last 8760 hours.  CBG: No results for input(s): GLUCAP in the last 168 hours.  Recent Results (from the past 240 hour(s))  MRSA PCR Screening     Status: None   Collection Time: 01/01/16 12:14 AM  Result Value Ref Range Status   MRSA by PCR NEGATIVE NEGATIVE Final    Comment:        The GeneXpert MRSA Assay (FDA approved for NASAL specimens only), is one component of a comprehensive MRSA colonization surveillance program. It is not intended to diagnose MRSA infection  nor to guide or monitor treatment for MRSA infections.      Studies: No results found.  Scheduled Meds: . sodium chloride   Intravenous Once  . amLODipine  10 mg Oral QHS  . anticoagulant sodium citrate  5 mL Intravenous Once  . calcium carbonate  2,000 mg Oral TID WC  . darbepoetin (ARANESP) injection - DIALYSIS  40 mcg Intravenous Q Fri-HD  . feeding supplement  1 Container Oral TID BM  . [START ON 01/04/2016] fluconazole  50 mg Oral Daily  . labetalol  600 mg Oral BID  . multivitamin  1 tablet Oral QHS  . mycophenolate  360 mg Oral BID  . pantoprazole  40 mg Oral Q lunch  . predniSONE  20 mg Oral Q breakfast  . sodium bicarbonate  1,300 mg Oral Daily  . sodium bicarbonate  650 mg Oral QHS  . sodium chloride  3 mL Intravenous Q12H  . sodium chloride  3 mL Intravenous Q12H  . sulfamethoxazole-trimethoprim  1 tablet Oral Q M,W,F-2000  . tacrolimus  5 mg Oral BID  . valGANciclovir  450 mg Oral Q M,W,F-2000   Continuous Infusions:   Principal Problem:   Acute blood loss anemia Active Problems:   Renal transplant recipient   Hyperkalemia   Anemia   HTN (hypertension)   CKD (chronic kidney disease)   Hematochezia    Time spent: >35 minutes     Kinnie Feil  [facility] Pager [phone]. If 7PM-7AM, please contact night-coverage at [email or web address], password [facility] 01/03/2016, 1:33 PM  LOS: 3 days

## 2016-01-03 NOTE — Discharge Instructions (Signed)
Continue to take pantoprazole (Protonix) at lunch time- should be separated from transplant medications by ~2h. Please inform your transplant team this medication has been started for GI bleed. They may want to check tacrolimus and/or mycophenolate levels more often.

## 2016-01-04 LAB — CBC
HCT: 24.8 % — ABNORMAL LOW (ref 39.0–52.0)
Hemoglobin: 8.1 g/dL — ABNORMAL LOW (ref 13.0–17.0)
MCH: 27.6 pg (ref 26.0–34.0)
MCHC: 32.7 g/dL (ref 30.0–36.0)
MCV: 84.6 fL (ref 78.0–100.0)
Platelets: 139 10*3/uL — ABNORMAL LOW (ref 150–400)
RBC: 2.93 MIL/uL — ABNORMAL LOW (ref 4.22–5.81)
RDW: 15.4 % (ref 11.5–15.5)
WBC: 9.7 10*3/uL (ref 4.0–10.5)

## 2016-01-04 LAB — PTH, INTACT AND CALCIUM
Calcium, Total (PTH): 8.5 mg/dL — ABNORMAL LOW (ref 8.7–10.2)
PTH: 439 pg/mL — ABNORMAL HIGH (ref 15–65)

## 2016-01-04 NOTE — Discharge Summary (Signed)
Physician Discharge Summary  Tim Baker [id number] DOB: 07-Jul-1976 DOA: 12/31/2015  PCP: Thressa Sheller, MD  Admit date: 12/31/2015 Discharge date: 01/04/2016  Time spent: >35 minutes  Recommendations for Outpatient Follow-up:  F/u with PCP in 2-3 days with repeat CBC F/u with HD as scheduled   Discharge Diagnoses:  Principal Problem:   Acute blood loss anemia Active Problems:   Renal transplant recipient   Hyperkalemia   Anemia   HTN (hypertension)   CKD (chronic kidney disease)   Hematochezia   Discharge Condition: stable   Diet recommendation: renal   Filed Weights   01/03/16 0711 01/03/16 1112 01/03/16 2151  Weight: 101.4 kg (223 lb 8.7 oz) 100.7 kg (222 lb 0.1 oz) 101.606 kg (224 lb)    History of present illness:  40 y.o. 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 Initial evaluation reveals acute blood loss anemia with a hemoglobin of 5.8 and +FOBT. Patient underwent EGD/colon and found to have AVMs  Hospital Course:  1. Acute blood loss anemia due to GI bleeding. Hg 5.8 on admission. guac positive stool.  -Pt underwent EGD/colon on 1/19: LA Grade C esophagitis. Mild antral gastritis. Duodenitis. 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 -Pt TFsed 4 units. Repeat Hg is stable. No further obvious bleeding. Pt wants to go home today. Recommended to f/u with repeat Hg in 2-3 days.   2. ESRD on HD. h/o Renal transplant patient/chronic kidney disease. 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 See discharge summary dated December 2016. -cont HD as per nephrology  3. Hypertension. Controlled.Continue home medications   Procedures:  HD EGD ENDOSCOPIC IMPRESSION: 1) Hepatic flexure AVMs s/p APC. 2) ? Cecal AVMs.   ENDOSCOPIC IMPRESSION: 1) LA Grade C esophagitis. 2) Mild  antral gastritis. 3) Duodenitis.    (i.e. Studies not automatically included, echos, thoracentesis, etc; not x-rays)  Consultations:  GI   Discharge Exam: Filed Vitals:   01/04/16 0431 01/04/16 0914  BP: 145/72 136/73  Pulse: 66 70  Temp: 98.2 F (36.8 C) 98.9 F (37.2 C)  Resp: 19 18    General: no distress  Cardiovascular: s1,s2 rrr Respiratory:  CTA BL  Discharge Instructions  Discharge Instructions    Diet - low sodium heart healthy    Complete by:  As directed      Discharge instructions    Complete by:  As directed   Please follow up with primary care doctor in 3 days with repeat blood work     Increase activity slowly    Complete by:  As directed             Medication List    STOP taking these medications        methylPREDNISolone sodium succinate 1,000 mg in sodium chloride 0.9 % 50 mL      TAKE these medications        amLODipine 10 MG tablet  Commonly known as:  NORVASC  Take 10 mg by mouth at bedtime.     BEPREVE 1.5 % Soln  Generic drug:  Bepotastine Besilate  Apply 1 drop to eye as needed (allergies).     DYMISTA 137-50 MCG/ACT Susp  Generic drug:  Azelastine-Fluticasone  Place 1 spray into the nose as needed (allergies).     fluconazole 50 MG tablet  Commonly known as:  DIFLUCAN  Take 50 mg by mouth daily.     labetalol 300 MG tablet  Commonly known as:  NORMODYNE  Take 600 mg by mouth 2 (two) times daily.     mycophenolate 180 MG EC tablet  Commonly known as:  MYFORTIC  Take 360 mg by mouth 2 (two) times daily.     predniSONE 5 MG tablet  Commonly known as:  DELTASONE  Take 20 mg by mouth daily with breakfast.     sodium bicarbonate 650 MG tablet  Take 650-1,300 mg by mouth 2 (two) times daily. Take 1300 mg in the morning and 650 mg in the evening     sulfamethoxazole-trimethoprim 400-80 MG tablet  Commonly known as:  BACTRIM,SEPTRA  Take 1 tablet by mouth every Monday, Wednesday, and Friday.     tacrolimus 1 MG  capsule  Commonly known as:  PROGRAF  Take 5 mg by mouth 2 (two) times daily.     TUMS ULTRA 1000 400 MG chewable tablet  Generic drug:  calcium elemental as carbonate  Chew 2,000 mg by mouth 3 (three) times daily.     VALCYTE 450 MG tablet  Generic drug:  valGANciclovir  Take 450 mg by mouth every Monday, Wednesday, and Friday.       No Known Allergies     Follow-up Information    Follow up with Thressa Sheller, MD. Schedule an appointment as soon as possible for a visit in 3 days.   Specialty:  Internal Medicine   Contact information:   [place], [address] [place] [address] [phone]        The results of significant diagnostics from this hospitalization (including imaging, microbiology, ancillary and laboratory) are listed below for reference.    Significant Diagnostic Studies: No results found.  Microbiology: Recent Results (from the past 240 hour(s))  MRSA PCR Screening     Status: None   Collection Time: 01/01/16 12:14 AM  Result Value Ref Range Status   MRSA by PCR NEGATIVE NEGATIVE Final    Comment:        The GeneXpert MRSA Assay (FDA approved for NASAL specimens only), is one component of a comprehensive MRSA colonization surveillance program. It is not intended to diagnose MRSA infection nor to guide or monitor treatment for MRSA infections.      Labs: Basic Metabolic Panel:  Recent Labs Lab 12/31/15 1452 01/01/16 0547 01/02/16 1000 01/03/16 0728 01/03/16 0730  NA 140 140 135  --  135  K 5.6* 5.1 3.9  --  3.7  CL 106 109 99*  --  97*  CO2 25 21* 25  --  22  GLUCOSE 120* 81 90  --  86  BUN 52* 65* 33*  --  46*  CREATININE 10.81* 12.52* 8.51*  --  11.50*  CALCIUM 8.7* 9.1 9.4 8.5* 8.5*  PHOS  --   --  5.6*  --  5.3*   Liver Function Tests:  Recent Labs Lab 12/31/15 1452 01/01/16 0547 01/02/16 1000 01/03/16 0730  AST 16 13*  --   --   ALT 17 15*  --   --   ALKPHOS 40 38  --   --   BILITOT 0.5 0.9  --   --    PROT 5.5* 5.4*  --   --   ALBUMIN 2.9* 2.9* 3.4* 2.9*   No results for input(s): LIPASE, AMYLASE in the last 168 hours. No results for input(s): AMMONIA in the last 168 hours. CBC:  Recent Labs Lab  01/01/16 0547  01/02/16 0532 01/02/16 1637 01/02/16 2155 01/03/16 0627 01/03/16 0728 01/04/16 0454  WBC 7.3  --  8.0  --   --  11.6* 10.9* 9.7  HGB 6.8*  < > 9.2* 8.8* 9.1* 8.9* 8.3* 8.1*  HCT 21.0*  < > 27.6* 27.4* 28.3* 26.2* 24.9* 24.8*  MCV 84.3  --  83.4  --   --  82.9 82.7 84.6  PLT 175  --  157  --   --  149* 133* 139*  < > = values in this interval not displayed. Cardiac Enzymes: No results for input(s): CKTOTAL, CKMB, CKMBINDEX, TROPONINI in the last 168 hours. BNP: BNP (last 3 results) No results for input(s): BNP in the last 8760 hours.  ProBNP (last 3 results) No results for input(s): PROBNP in the last 8760 hours.  CBG: No results for input(s): GLUCAP in the last 168 hours.     SignedKinnie Feil  [facility] 01/04/2016, 9:57 AM

## 2016-01-04 NOTE — Progress Notes (Signed)
Discharge instructions and medications discussed with patient.  All questions answered.  

## 2016-01-29 ENCOUNTER — Other Ambulatory Visit: Payer: Self-pay

## 2016-01-29 DIAGNOSIS — Z01810 Encounter for preprocedural cardiovascular examination: Secondary | ICD-10-CM

## 2016-01-29 DIAGNOSIS — N189 Chronic kidney disease, unspecified: Secondary | ICD-10-CM

## 2016-01-31 ENCOUNTER — Encounter: Payer: Self-pay | Admitting: Vascular Surgery

## 2016-02-04 ENCOUNTER — Encounter (HOSPITAL_COMMUNITY): Payer: Medicare Other

## 2016-02-04 ENCOUNTER — Other Ambulatory Visit (HOSPITAL_COMMUNITY): Payer: Medicare Other

## 2016-02-06 ENCOUNTER — Ambulatory Visit (HOSPITAL_COMMUNITY)
Admission: RE | Admit: 2016-02-06 | Discharge: 2016-02-06 | Disposition: A | Discharge status: Home / Self Care | Payer: Medicaid Other | Source: Ambulatory Visit | Attending: Vascular Surgery | Admitting: Vascular Surgery

## 2016-02-06 ENCOUNTER — Ambulatory Visit (INDEPENDENT_AMBULATORY_CARE_PROVIDER_SITE_OTHER)
Admission: RE | Admit: 2016-02-06 | Discharge: 2016-02-06 | Disposition: A | Discharge status: Home / Self Care | Payer: Medicaid Other | Source: Ambulatory Visit | Attending: Vascular Surgery | Admitting: Vascular Surgery

## 2016-02-06 DIAGNOSIS — N189 Chronic kidney disease, unspecified: Secondary | ICD-10-CM

## 2016-02-06 DIAGNOSIS — I120 Hypertensive chronic kidney disease with stage 5 chronic kidney disease or end stage renal disease: Secondary | ICD-10-CM | POA: Insufficient documentation

## 2016-02-06 DIAGNOSIS — N186 End stage renal disease: Secondary | ICD-10-CM | POA: Diagnosis not present

## 2016-02-06 DIAGNOSIS — Z01810 Encounter for preprocedural cardiovascular examination: Secondary | ICD-10-CM

## 2016-02-11 ENCOUNTER — Other Ambulatory Visit: Payer: Self-pay

## 2016-02-11 ENCOUNTER — Encounter: Payer: Self-pay | Admitting: Vascular Surgery

## 2016-02-11 ENCOUNTER — Ambulatory Visit (INDEPENDENT_AMBULATORY_CARE_PROVIDER_SITE_OTHER): Payer: Self-pay | Admitting: Vascular Surgery

## 2016-02-11 VITALS — BP 158/87 | HR 65 | Temp 99.1°F | Resp 18 | Ht 70.0 in | Wt 224.2 lb

## 2016-02-11 DIAGNOSIS — N186 End stage renal disease: Secondary | ICD-10-CM

## 2016-02-11 NOTE — Progress Notes (Signed)
Filed Vitals:   02/11/16 1523 02/11/16 1528  BP: 162/92 158/87  Pulse: 65   Temp: 99.1 F (37.3 C)   TempSrc: Oral   Resp: 18   Height: 5\' 10"  (1.778 m)   Weight: 224 lb 3.2 oz (101.696 kg)   SpO2: 98%

## 2016-02-11 NOTE — Progress Notes (Signed)
[facility] [place]  Patient name: Tim Baker MRN: [id number] DOB: 1976/09/11 Sex: male  REASON FOR VISIT:  Discussion of AV access for hemodialysis  HPI: Tim Baker is a 40 y.o. male  Here today for discussion of AV access for hemodialysis. He is very pleasant gentleman with a long history of renal failure. He had long history hemodialysis dating back to around 2007. 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 He had a left arm AV fistula that had continued to have some flow but was not able to be used for access and now has a new placement of a hemodialysis catheter. He is seen today for discussion of hemodialysis options.  Past Medical History  Diagnosis Date  . Hypertension   . Anemia   . H/O kidney transplant   . Hyperkalemia   . Hematochezia   . Heart murmur     "slight one"  . History of blood transfusion 12/31/2015    "this is my 1st" (12/31/2015)  . GERD (gastroesophageal reflux disease)   . Renal disorder   . ESRD (end stage renal disease) on dialysis [facility])     "MWF; [facility]" (12/31/2015)  . Allergy     Family History  Problem Relation Age of Onset  . Hypertension Father     SOCIAL HISTORY: Social History  Substance Use Topics  . Smoking status: Never Smoker   . Smokeless tobacco: Never Used  . Alcohol Use: Yes     Comment: one beer a month    No Known Allergies  Current Outpatient Prescriptions  Medication Sig Dispense Refill  . amLODipine (NORVASC) 10 MG tablet Take 10 mg by mouth at bedtime.     . Azelastine-Fluticasone (DYMISTA) 137-50 MCG/ACT SUSP Place 1 spray into the nose as needed (allergies).     . Bepotastine Besilate (BEPREVE) 1.5 % SOLN Apply 1 drop to eye as needed (allergies).     . calcium elemental as carbonate (TUMS ULTRA 1000) 400 MG chewable tablet Chew 2,000 mg by mouth 3 (three) times daily.    . fluconazole (DIFLUCAN) 50 MG tablet Take 50 mg by mouth daily.  0  . labetalol  (NORMODYNE) 300 MG tablet Take 600 mg by mouth 2 (two) times daily.     . mycophenolate (MYFORTIC) 180 MG EC tablet Take 540 mg by mouth 2 (two) times daily. Takes 3 (180 mg tablets) twice daily.    . predniSONE (DELTASONE) 5 MG tablet Take 20 mg by mouth daily with breakfast.    . sodium bicarbonate 650 MG tablet Take 650-1,300 mg by mouth 2 (two) times daily. Take 1300 mg in the morning and 650 mg in the evening  0  . sulfamethoxazole-trimethoprim (BACTRIM,SEPTRA) 400-80 MG tablet Take 1 tablet by mouth every Monday, Wednesday, and Friday.  4  . tacrolimus (PROGRAF) 1 MG capsule Take 4 mg by mouth 2 (two) times daily. Takes four (1 mg) tablets two times daily.    . valGANciclovir (VALCYTE) 450 MG tablet Take 450 mg by mouth every Monday, Wednesday, and Friday.     No current facility-administered medications for this visit.    REVIEW OF SYSTEMS:  [X]  denotes positive finding, [ ]  denotes negative finding Cardiac  Comments:  Chest pain or chest pressure:    Shortness of breath upon exertion:    Short of breath when lying flat:    Irregular heart rhythm:        Vascular  Pain in calf, thigh, or hip brought on by ambulation:    Pain in feet at night that wakes you up from your sleep:     Blood clot in your veins:    Leg swelling:         Pulmonary    Oxygen at home:    Productive cough:     Wheezing:         Neurologic    Sudden weakness in arms or legs:     Sudden numbness in arms or legs:     Sudden onset of difficulty speaking or slurred speech:    Temporary loss of vision in one eye:     Problems with dizziness:         Gastrointestinal    Blood in stool:  x   Vomited blood:         Genitourinary    Burning when urinating:     Blood in urine:        Psychiatric    Major depression:         Hematologic    Bleeding problems:    Problems with blood clotting too easily:        Skin    Rashes or ulcers:        Constitutional    Fever or chills:      PHYSICAL  EXAM: Filed Vitals:   02/11/16 1523 02/11/16 1528  BP: 162/92 158/87  Pulse: 65   Temp: 99.1 F (37.3 C)   TempSrc: Oral   Resp: 18   Height: 5\' 10"  (1.778 m)   Weight: 224 lb 3.2 oz (101.696 kg)   SpO2: 98%     GENERAL: The patient is a well-nourished male, in no acute distress. The vital signs are documented above. VASCULAR:  2+ radial pulses bilaterally PULMONARY: There is good air exchange  MUSCULOSKELETAL: There are no major deformities or cyanosis. NEUROLOGIC: No focal weakness or paresthesias are detected. SKIN: There are no ulcers or rashes noted. PSYCHIATRIC: The patient has a normal affect.  several prior left radiocephalic fistula. This is thrombosed at the level of the wrist but does appear to have flow above this. DATA:   arteriovenous upper extremity exam from 02/06/2016 our office reveals normal arterial flow in both upper extremities. The cephalic vein on the right was small moderate size. There was no mention of the cephalic vein on the left. I did image this with SonoSite ultrasound. The vein is quite dilated throughout its course from the antecubital space proximal. He also has a patency of the cephalic vein throughout his forearm down to the level of the old thrombosed fistula. He has an easily palpable prominent radial pulse at the same level several centimeters above his wrist.  MEDICAL ISSUES:  incisions renal disease. He does have hemodialysis on Monday Wednesday and Friday and therefore would require surgery on Tuesday or Thursday. I would recommend a new forearm fistula with the takeoff of the aorta develop vein above the thrombosed segment. And this could be transposed anastomosis to the radial artery above this as well. Feel that this vein is already quite dilated and matured for years of flow and therefore should be able to be used within 1 month of AV fistula creation. Patient understands I cannot do the surgery sometime in the office on Tuesdays and Thursdays.  We will schedule this with one of my partners  No Follow-up on file.   Patriece Archbold, Sherren Mocha [facility] [employer]:  [phone]    

## 2016-02-19 ENCOUNTER — Encounter (HOSPITAL_COMMUNITY): Payer: Self-pay | Admitting: *Deleted

## 2016-02-19 MED ORDER — SODIUM CHLORIDE 0.9 % IV SOLN: INTRAVENOUS | Status: DC

## 2016-02-19 MED ORDER — DEXTROSE 5 % IV SOLN
1.5000 g | INTRAVENOUS | Status: AC
  Administered 2016-02-20: 1.5 g via INTRAVENOUS
  Filled 2016-02-19 (×2): qty 1.5

## 2016-02-19 NOTE — Progress Notes (Signed)
Anesthesia Chart Review:  Pt is a 40 year old male scheduled for L AV fistula creation on 02/20/2016 with Dr. Scot Dock.   Pt is a same day work up.   PMH includes:  ESRD on HD (s/p renal transplant that was rejected 11/2012), HTN, heart murmur. Never smoker. BMI 32  Pt hospitalized 1/17-1/12/17 for acute blood loss anemia (hgb 5.8) (found to be due to hepatic flexure AVM on EGD); received 4 units of RBCs.   Pt hospitalized 12/19-12/20/16 for acute renal failure due to renal transplant rejection.   Medications include: amlodipine, dymista, labetalol, mucophenolate, prednisone, bactrim, prograf.   Labs will be obtained DOS.   EKG 12/31/15: Sinus or ectopic atrial tachycardia (135 bpm). Atrial premature complexes. Incomplete RBBB and LAFB. Consider right ventricular hypertrophy. Done in the setting of acute blood loss (hgb 5.8). Will repeat DOS.   If labs and EKG acceptable DOS, I anticipate pt can proceed as scheduled.   Willeen Cass, FNP-BC [facility]/Anesthesiology Phone: [phone] 02/19/2016 10:21 AM

## 2016-02-19 NOTE — Anesthesia Preprocedure Evaluation (Addendum)
Anesthesia Evaluation  Patient identified by MRN, date of birth, ID band Patient awake    Reviewed: Allergy & Precautions, H&P , NPO status , Patient's Chart, lab work & pertinent test results, reviewed documented beta blocker date and time   History of Anesthesia Complications Negative for: history of anesthetic complications  Airway Mallampati: II  TM Distance: >3 FB Neck ROM: full    Dental no notable dental hx. (+) Teeth Intact, Dental Advisory Given   Pulmonary neg pulmonary ROS,    Pulmonary exam normal breath sounds clear to auscultation       Cardiovascular hypertension, Pt. on medications  Rhythm:Regular Rate:Normal + Systolic murmurs    Neuro/Psych negative neurological ROS     GI/Hepatic GERD  ,  Endo/Other  negative endocrine ROS  Renal/GU DialysisRenal disease     Musculoskeletal negative musculoskeletal ROS (+)   Abdominal   Peds  Hematology  (+) anemia ,   Anesthesia Other Findings      Reproductive/Obstetrics negative OB ROS                           Anesthesia Physical  Anesthesia Plan  ASA: III  Anesthesia Plan: MAC   Post-op Pain Management:    Induction: Intravenous  Airway Management Planned: Natural Airway and Simple Face Mask  Additional Equipment:   Intra-op Plan:   Post-operative Plan:   Informed Consent: I have reviewed the patients History and Physical, chart, labs and discussed the procedure including the risks, benefits and alternatives for the proposed anesthesia with the patient or authorized representative who has indicated his/her understanding and acceptance.   Dental advisory given  Plan Discussed with: CRNA and Anesthesiologist  Anesthesia Plan Comments:        Anesthesia Quick Evaluation

## 2016-02-19 NOTE — Progress Notes (Signed)
Pt states he has been told he has a "slight" heart murmur. States it has never given him any problems. Denies any recent chest pain or sob.

## 2016-02-20 ENCOUNTER — Encounter (HOSPITAL_COMMUNITY): Payer: Self-pay | Admitting: Anesthesiology

## 2016-02-20 ENCOUNTER — Ambulatory Visit (HOSPITAL_COMMUNITY): Payer: Medicaid Other | Admitting: Emergency Medicine

## 2016-02-20 ENCOUNTER — Encounter (HOSPITAL_COMMUNITY)
Admission: RE | Disposition: A | Discharge status: Home / Self Care | Payer: Self-pay | Source: Ambulatory Visit | Attending: Vascular Surgery

## 2016-02-20 ENCOUNTER — Ambulatory Visit (HOSPITAL_COMMUNITY)
Admission: RE | Admit: 2016-02-20 | Discharge: 2016-02-20 | Disposition: A | Discharge status: Home / Self Care | Payer: Medicaid Other | Source: Ambulatory Visit | Attending: Vascular Surgery | Admitting: Vascular Surgery

## 2016-02-20 ENCOUNTER — Other Ambulatory Visit: Payer: Self-pay

## 2016-02-20 DIAGNOSIS — Y832 Surgical operation with anastomosis, bypass or graft as the cause of abnormal reaction of the patient, or of later complication, without mention of misadventure at the time of the procedure: Secondary | ICD-10-CM | POA: Insufficient documentation

## 2016-02-20 DIAGNOSIS — Z992 Dependence on renal dialysis: Secondary | ICD-10-CM | POA: Insufficient documentation

## 2016-02-20 DIAGNOSIS — Z940 Kidney transplant status: Secondary | ICD-10-CM | POA: Diagnosis not present

## 2016-02-20 DIAGNOSIS — Z48812 Encounter for surgical aftercare following surgery on the circulatory system: Secondary | ICD-10-CM

## 2016-02-20 DIAGNOSIS — N186 End stage renal disease: Secondary | ICD-10-CM

## 2016-02-20 DIAGNOSIS — T82898A Other specified complication of vascular prosthetic devices, implants and grafts, initial encounter: Secondary | ICD-10-CM | POA: Insufficient documentation

## 2016-02-20 DIAGNOSIS — T82868A Thrombosis of vascular prosthetic devices, implants and grafts, initial encounter: Secondary | ICD-10-CM

## 2016-02-20 DIAGNOSIS — N185 Chronic kidney disease, stage 5: Secondary | ICD-10-CM

## 2016-02-20 DIAGNOSIS — I120 Hypertensive chronic kidney disease with stage 5 chronic kidney disease or end stage renal disease: Secondary | ICD-10-CM | POA: Diagnosis present

## 2016-02-20 HISTORY — DX: Gastrointestinal hemorrhage, unspecified: K92.2

## 2016-02-20 HISTORY — PX: AV FISTULA PLACEMENT: SHX1204

## 2016-02-20 LAB — POCT I-STAT 4, (NA,K, GLUC, HGB,HCT)
Glucose, Bld: 92 mg/dL (ref 65–99)
HCT: 29 % — ABNORMAL LOW (ref 39.0–52.0)
Hemoglobin: 9.9 g/dL — ABNORMAL LOW (ref 13.0–17.0)
Potassium: 3.7 mmol/L (ref 3.5–5.1)
Sodium: 139 mmol/L (ref 135–145)

## 2016-02-20 SURGERY — ARTERIOVENOUS (AV) FISTULA CREATION
Anesthesia: Monitor Anesthesia Care | Site: Arm Lower | Laterality: Left

## 2016-02-20 MED ORDER — OXYCODONE-ACETAMINOPHEN 5-325 MG PO TABS: 1.0000 | ORAL_TABLET | ORAL | Status: DC | PRN

## 2016-02-20 MED ORDER — HEPARIN SODIUM (PORCINE) 1000 UNIT/ML IJ SOLN
INTRAMUSCULAR | Status: DC | PRN
  Administered 2016-02-20: 8000 [IU] via INTRAVENOUS

## 2016-02-20 MED ORDER — MIDAZOLAM HCL 5 MG/5ML IJ SOLN
INTRAMUSCULAR | Status: DC | PRN
Start: 2016-02-20 — End: 2016-02-20
  Administered 2016-02-20: 2 mg via INTRAVENOUS

## 2016-02-20 MED ORDER — CHLORHEXIDINE GLUCONATE CLOTH 2 % EX PADS: 6.0000 | MEDICATED_PAD | Freq: Once | CUTANEOUS | Status: DC

## 2016-02-20 MED ORDER — LABETALOL HCL 5 MG/ML IV SOLN
INTRAVENOUS | Status: DC | PRN
  Administered 2016-02-20 (×2): 5 mg via INTRAVENOUS

## 2016-02-20 MED ORDER — LIDOCAINE HCL (PF) 1 % IJ SOLN
INTRAMUSCULAR | Status: DC | PRN
  Administered 2016-02-20: 30 mL

## 2016-02-20 MED ORDER — ONDANSETRON HCL 4 MG/2ML IJ SOLN
INTRAMUSCULAR | Status: AC
  Filled 2016-02-20: qty 2

## 2016-02-20 MED ORDER — LIDOCAINE HCL (CARDIAC) 20 MG/ML IV SOLN
INTRAVENOUS | Status: DC | PRN
  Administered 2016-02-20: 50 mg via INTRATRACHEAL

## 2016-02-20 MED ORDER — SODIUM CHLORIDE 0.9 % IV SOLN
INTRAVENOUS | Status: DC | PRN
  Administered 2016-02-20: 08:00:00

## 2016-02-20 MED ORDER — PROPOFOL 500 MG/50ML IV EMUL
INTRAVENOUS | Status: DC | PRN
  Administered 2016-02-20: 75 ug/kg/min via INTRAVENOUS
  Administered 2016-02-20: 08:00:00 via INTRAVENOUS

## 2016-02-20 MED ORDER — SODIUM CHLORIDE 0.9 % IV SOLN
INTRAVENOUS | Status: DC | PRN
  Administered 2016-02-20: 07:00:00 via INTRAVENOUS

## 2016-02-20 MED ORDER — LIDOCAINE HCL (PF) 1 % IJ SOLN
INTRAMUSCULAR | Status: AC
  Filled 2016-02-20: qty 30

## 2016-02-20 MED ORDER — PROPOFOL 10 MG/ML IV BOLUS
INTRAVENOUS | Status: AC
  Filled 2016-02-20: qty 20

## 2016-02-20 MED ORDER — LIDOCAINE-EPINEPHRINE (PF) 1 %-1:200000 IJ SOLN
INTRAMUSCULAR | Status: AC
  Filled 2016-02-20: qty 30

## 2016-02-20 MED ORDER — FENTANYL CITRATE (PF) 250 MCG/5ML IJ SOLN
INTRAMUSCULAR | Status: AC
  Filled 2016-02-20: qty 5

## 2016-02-20 MED ORDER — PROTAMINE SULFATE 10 MG/ML IV SOLN
INTRAVENOUS | Status: DC | PRN
  Administered 2016-02-20: 40 mg via INTRAVENOUS

## 2016-02-20 MED ORDER — FENTANYL CITRATE (PF) 100 MCG/2ML IJ SOLN
INTRAMUSCULAR | Status: DC | PRN
Start: 2016-02-20 — End: 2016-02-20
  Administered 2016-02-20: 50 ug via INTRAVENOUS

## 2016-02-20 MED ORDER — MIDAZOLAM HCL 2 MG/2ML IJ SOLN
INTRAMUSCULAR | Status: AC
  Filled 2016-02-20: qty 2

## 2016-02-20 MED ORDER — LIDOCAINE HCL (CARDIAC) 20 MG/ML IV SOLN
INTRAVENOUS | Status: AC
  Filled 2016-02-20: qty 5

## 2016-02-20 SURGICAL SUPPLY — 28 items
ARMBAND PINK RESTRICT EXTREMIT (MISCELLANEOUS) ×2 IMPLANT
CANISTER SUCTION 2500CC (MISCELLANEOUS) ×2 IMPLANT
CANNULA VESSEL 3MM 2 BLNT TIP (CANNULA) ×2 IMPLANT
CLIP TI MEDIUM 6 (CLIP) ×2 IMPLANT
CLIP TI WIDE RED SMALL 6 (CLIP) ×2 IMPLANT
COVER PROBE W GEL 5X96 (DRAPES) IMPLANT
ELECT REM PT RETURN 9FT ADLT (ELECTROSURGICAL) ×2
ELECTRODE REM PT RTRN 9FT ADLT (ELECTROSURGICAL) ×1 IMPLANT
GAUZE SPONGE 4X4 16PLY XRAY LF (GAUZE/BANDAGES/DRESSINGS) ×1 IMPLANT
GLOVE BIO SURGEON STRL SZ7.5 (GLOVE) ×2 IMPLANT
GLOVE BIOGEL PI IND STRL 7.0 (GLOVE) IMPLANT
GLOVE BIOGEL PI IND STRL 8 (GLOVE) ×1 IMPLANT
GLOVE BIOGEL PI INDICATOR 7.0 (GLOVE) ×1
GLOVE BIOGEL PI INDICATOR 8 (GLOVE) ×1
GLOVE ECLIPSE 7.5 STRL STRAW (GLOVE) ×1 IMPLANT
GOWN STRL REUS W/ TWL LRG LVL3 (GOWN DISPOSABLE) ×3 IMPLANT
GOWN STRL REUS W/TWL LRG LVL3 (GOWN DISPOSABLE) ×6
KIT BASIN OR (CUSTOM PROCEDURE TRAY) ×2 IMPLANT
KIT ROOM TURNOVER OR (KITS) ×2 IMPLANT
LIQUID BAND (GAUZE/BANDAGES/DRESSINGS) ×2 IMPLANT
PACK CV ACCESS (CUSTOM PROCEDURE TRAY) ×2 IMPLANT
PAD ARMBOARD 7.5X6 YLW CONV (MISCELLANEOUS) ×4 IMPLANT
SPONGE SURGIFOAM ABS GEL 100 (HEMOSTASIS) IMPLANT
SUT PROLENE 6 0 BV (SUTURE) ×4 IMPLANT
SUT VIC AB 3-0 SH 27 (SUTURE) ×2
SUT VIC AB 3-0 SH 27X BRD (SUTURE) ×1 IMPLANT
SUT VICRYL 4-0 PS2 18IN ABS (SUTURE) ×2 IMPLANT
UNDERPAD 30X30 INCONTINENT (UNDERPADS AND DIAPERS) ×2 IMPLANT

## 2016-02-20 NOTE — Op Note (Signed)
    NAME: Tim Baker   MRN: [id number] DOB: 1976/04/09    DATE OF OPERATION: 02/20/2016  PREOP DIAGNOSIS: end-stage renal disease  POSTOP DIAGNOSIS: same  PROCEDURE: Revision of left radial cephalic AV fistula  SURGEON: Judeth Cornfield. Scot Dock, MD, FACS  ASSIST: none  ANESTHESIA: local with sedation   EBL: minimal  INDICATIONS: [name] is a 39 y.o. male who has an old left radiocephalic AV fistula that is clotted proximally. He had a kidney transplant that failed and now he is being dialyzed with a catheter. He was seen in the office by Dr. Donnetta Hutching and set up for revision of this fistula with the plan to reanastomose the vein in the more proximal radial artery.  FINDINGS: palpable thrill at completion of procedure.  TECHNIQUE: The patient was taken to the operative room and sedated by anesthesia. The left upper extremity was prepped and draped in the usual sterile fashion. Identified the area of the vein by duplex and appeared fairly soft and reasonable. This was above the clotted aneurysm at the anastomosis and a second aneurysm just central to that. A transverse incision was made at this level after the skin was anesthetized. Through this incision the cephalic vein was dissected free down to the level of the aneurysm where it was ligated. It irrigated up with heparinized saline. The radial artery was then dissected free beneath the fascia. It had a good pulse. It was somewhat thin given that he had this chronic fistula. The patient was heparinized. The radial artery was clamped proximally and distally and a longitudinal arteriotomy was made. The vein was sewn end-to-side to the artery using continuous 6-0 Prolene suture. At the completion was a good thrill in the fistula. The heparin was partially reversed with protamine. The wound was closed with a deep layer of 3-0 Vicryl and the skin closed with 4-0 Vicryl. Dermabond was applied. The patient tolerated the procedure well and was  transferred to the recovery room in stable condition. All needle and sponge counts were correct.  [name], MD, FACS [facility] [address]  DATE OF DICTATION:   02/20/2016

## 2016-02-20 NOTE — Anesthesia Postprocedure Evaluation (Signed)
Anesthesia Post Note  Patient: Tim Baker  Procedure(s) Performed: Procedure(s) (LRB): REVISION OF LEFT ARTERIOVENOUS (AV) FISTULA (Left)  Patient location during evaluation: PACU Anesthesia Type: MAC Level of consciousness: awake and alert Pain management: pain level controlled Vital Signs Assessment: post-procedure vital signs reviewed and stable Respiratory status: spontaneous breathing, nonlabored ventilation, respiratory function stable and patient connected to nasal cannula oxygen Cardiovascular status: stable and blood pressure returned to baseline Anesthetic complications: no    Last Vitals:  Filed Vitals:   02/20/16 1037 02/20/16 1046  BP:  161/108  Pulse:  68  Temp: 36.8 C   Resp:      Last Pain: There were no vitals filed for this visit.               Maryrose Colvin DANIEL

## 2016-02-20 NOTE — Transfer of Care (Signed)
Immediate Anesthesia Transfer of Care Note  Patient: Tim Baker  Procedure(s) Performed: Procedure(s): REVISION OF LEFT ARTERIOVENOUS (AV) FISTULA (Left)  Patient Location: PACU  Anesthesia Type:MAC  Level of Consciousness: awake, alert , oriented and patient cooperative  Airway & Oxygen Therapy: Patient Spontanous Breathing  Post-op Assessment: Report given to RN and Post -op Vital signs reviewed and stable  Post vital signs: Reviewed and stable  Last Vitals:  Filed Vitals:   02/20/16 0918 02/20/16 0919  BP: 155/97   Pulse: 77   Temp:  36.7 C  Resp: 18     Complications: No apparent anesthesia complications

## 2016-02-20 NOTE — Addendum Note (Signed)
Addendum  created 02/20/16 1500 by Laretta Alstrom, CRNA   Modules edited: Anesthesia Events, Narrator   Narrator:  Narrator: Event Log Edited

## 2016-02-20 NOTE — H&P (View-Only) (Signed)
[facility] [place]  Patient name: Tim Baker MRN: [id number] DOB: 10/31/1976 Sex: male  REASON FOR VISIT:  Discussion of AV access for hemodialysis  HPI: Tim Baker is a 40 y.o. male  Here today for discussion of AV access for hemodialysis. He is very pleasant gentleman with a long history of renal failure. He had long history hemodialysis dating back to around 2007. 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 He had a left arm AV fistula that had continued to have some flow but was not able to be used for access and now has a new placement of a hemodialysis catheter. He is seen today for discussion of hemodialysis options.  Past Medical History  Diagnosis Date  . Hypertension   . Anemia   . H/O kidney transplant   . Hyperkalemia   . Hematochezia   . Heart murmur     "slight one"  . History of blood transfusion 12/31/2015    "this is my 1st" (12/31/2015)  . GERD (gastroesophageal reflux disease)   . Renal disorder   . ESRD (end stage renal disease) on dialysis [facility])     "MWF; [facility]" (12/31/2015)  . Allergy     Family History  Problem Relation Age of Onset  . Hypertension Father     SOCIAL HISTORY: Social History  Substance Use Topics  . Smoking status: Never Smoker   . Smokeless tobacco: Never Used  . Alcohol Use: Yes     Comment: one beer a month    No Known Allergies  Current Outpatient Prescriptions  Medication Sig Dispense Refill  . amLODipine (NORVASC) 10 MG tablet Take 10 mg by mouth at bedtime.     . Azelastine-Fluticasone (DYMISTA) 137-50 MCG/ACT SUSP Place 1 spray into the nose as needed (allergies).     . Bepotastine Besilate (BEPREVE) 1.5 % SOLN Apply 1 drop to eye as needed (allergies).     . calcium elemental as carbonate (TUMS ULTRA 1000) 400 MG chewable tablet Chew 2,000 mg by mouth 3 (three) times daily.    . fluconazole (DIFLUCAN) 50 MG tablet Take 50 mg by mouth daily.  0  . labetalol  (NORMODYNE) 300 MG tablet Take 600 mg by mouth 2 (two) times daily.     . mycophenolate (MYFORTIC) 180 MG EC tablet Take 540 mg by mouth 2 (two) times daily. Takes 3 (180 mg tablets) twice daily.    . predniSONE (DELTASONE) 5 MG tablet Take 20 mg by mouth daily with breakfast.    . sodium bicarbonate 650 MG tablet Take 650-1,300 mg by mouth 2 (two) times daily. Take 1300 mg in the morning and 650 mg in the evening  0  . sulfamethoxazole-trimethoprim (BACTRIM,SEPTRA) 400-80 MG tablet Take 1 tablet by mouth every Monday, Wednesday, and Friday.  4  . tacrolimus (PROGRAF) 1 MG capsule Take 4 mg by mouth 2 (two) times daily. Takes four (1 mg) tablets two times daily.    . valGANciclovir (VALCYTE) 450 MG tablet Take 450 mg by mouth every Monday, Wednesday, and Friday.     No current facility-administered medications for this visit.    REVIEW OF SYSTEMS:  [X]  denotes positive finding, [ ]  denotes negative finding Cardiac  Comments:  Chest pain or chest pressure:    Shortness of breath upon exertion:    Short of breath when lying flat:    Irregular heart rhythm:        Vascular  Pain in calf, thigh, or hip brought on by ambulation:    Pain in feet at night that wakes you up from your sleep:     Blood clot in your veins:    Leg swelling:         Pulmonary    Oxygen at home:    Productive cough:     Wheezing:         Neurologic    Sudden weakness in arms or legs:     Sudden numbness in arms or legs:     Sudden onset of difficulty speaking or slurred speech:    Temporary loss of vision in one eye:     Problems with dizziness:         Gastrointestinal    Blood in stool:  x   Vomited blood:         Genitourinary    Burning when urinating:     Blood in urine:        Psychiatric    Major depression:         Hematologic    Bleeding problems:    Problems with blood clotting too easily:        Skin    Rashes or ulcers:        Constitutional    Fever or chills:      PHYSICAL  EXAM: Filed Vitals:   02/11/16 1523 02/11/16 1528  BP: 162/92 158/87  Pulse: 65   Temp: 99.1 F (37.3 C)   TempSrc: Oral   Resp: 18   Height: 5\' 10"  (1.778 m)   Weight: 224 lb 3.2 oz (101.696 kg)   SpO2: 98%     GENERAL: The patient is a well-nourished male, in no acute distress. The vital signs are documented above. VASCULAR:  2+ radial pulses bilaterally PULMONARY: There is good air exchange  MUSCULOSKELETAL: There are no major deformities or cyanosis. NEUROLOGIC: No focal weakness or paresthesias are detected. SKIN: There are no ulcers or rashes noted. PSYCHIATRIC: The patient has a normal affect.  several prior left radiocephalic fistula. This is thrombosed at the level of the wrist but does appear to have flow above this. DATA:   arteriovenous upper extremity exam from 02/06/2016 our office reveals normal arterial flow in both upper extremities. The cephalic vein on the right was small moderate size. There was no mention of the cephalic vein on the left. I did image this with SonoSite ultrasound. The vein is quite dilated throughout its course from the antecubital space proximal. He also has a patency of the cephalic vein throughout his forearm down to the level of the old thrombosed fistula. He has an easily palpable prominent radial pulse at the same level several centimeters above his wrist.  MEDICAL ISSUES:  incisions renal disease. He does have hemodialysis on Monday Wednesday and Friday and therefore would require surgery on Tuesday or Thursday. I would recommend a new forearm fistula with the takeoff of the aorta develop vein above the thrombosed segment. And this could be transposed anastomosis to the radial artery above this as well. Feel that this vein is already quite dilated and matured for years of flow and therefore should be able to be used within 1 month of AV fistula creation. Patient understands I cannot do the surgery sometime in the office on Tuesdays and Thursdays.  We will schedule this with one of my partners  No Follow-up on file.   Rune Mendez, Sherren Mocha [facility] [employer]:  [phone]    

## 2016-02-20 NOTE — Interval H&P Note (Signed)
History and Physical Interval Note:  02/20/2016 7:21 AM  Tim Baker  has presented today for surgery, with the diagnosis of End Stage Renal Disease N18.6  The various methods of treatment have been discussed with the patient and family. After consideration of risks, benefits and other options for treatment, the patient has consented to  Procedure(s): ARTERIOVENOUS (AV) FISTULA CREATION (Left) as a surgical intervention .  The patient's history has been reviewed, patient examined, no change in status, stable for surgery.  I have reviewed the patient's chart and labs.  Questions were answered to the patient's satisfaction.     [name]

## 2016-02-21 ENCOUNTER — Encounter (HOSPITAL_COMMUNITY): Payer: Self-pay | Admitting: Vascular Surgery

## 2016-02-24 ENCOUNTER — Telehealth: Payer: Self-pay | Admitting: Vascular Surgery

## 2016-02-24 NOTE — Telephone Encounter (Signed)
-----   Message from Denman George, RN sent at 02/20/2016 11:31 AM EST ----- Regarding: needs 4 week left arm access duplex and office visit with CSD   ----- Message -----    From: Angelia Mould, MD    Sent: 02/20/2016   9:21 AM      To: Vvs Charge Pool Subject: charge                                         PROCEDURE: Revision of left radial cephalic AV fistula  SURGEON: Judeth Cornfield. Scot Dock, MD, FACS  ASSIST: none  He will need a follow up visit in 4 weeks with a duplex at that time. Thank you. CD

## 2016-02-24 NOTE — Telephone Encounter (Signed)
LM for pt re appt, dpm °

## 2016-03-17 ENCOUNTER — Encounter: Payer: Self-pay | Admitting: Vascular Surgery

## 2016-03-20 ENCOUNTER — Ambulatory Visit (HOSPITAL_COMMUNITY)
Admission: RE | Admit: 2016-03-20 | Discharge: 2016-03-20 | Disposition: A | Discharge status: Home / Self Care | Payer: Medicaid Other | Source: Ambulatory Visit | Attending: Vascular Surgery | Admitting: Vascular Surgery

## 2016-03-20 DIAGNOSIS — Z48812 Encounter for surgical aftercare following surgery on the circulatory system: Secondary | ICD-10-CM | POA: Diagnosis not present

## 2016-03-20 DIAGNOSIS — N186 End stage renal disease: Secondary | ICD-10-CM | POA: Diagnosis not present

## 2016-03-25 ENCOUNTER — Ambulatory Visit: Payer: Self-pay | Admitting: Vascular Surgery

## 2016-03-25 ENCOUNTER — Ambulatory Visit (INDEPENDENT_AMBULATORY_CARE_PROVIDER_SITE_OTHER): Payer: Self-pay | Admitting: Vascular Surgery

## 2016-03-25 ENCOUNTER — Encounter: Payer: Self-pay | Admitting: Vascular Surgery

## 2016-03-25 VITALS — BP 158/97 | HR 68 | Ht 70.0 in | Wt 217.0 lb

## 2016-03-25 DIAGNOSIS — N186 End stage renal disease: Secondary | ICD-10-CM

## 2016-03-25 NOTE — Progress Notes (Signed)
Patient name: Tim Baker: [id number] DOB: 12-Sep-1976 Sex: male  REASON FOR VISIT: Follow up after revision of left radiocephalic AV fistula  HPI: Tim Baker is a 40 y.o. male who had an old left radiocephalic fistula that clotted proximally. He had a kidney transplant that failed and now is being dialyzed with a catheter. He was seen in the office by Dr. Sherren Mocha Early and set up for revision of his fistula with plans to reanastomose the vein to the more proximal radial artery. This was done on 02/20/2016.  Comes in for a follow up visit. He has no specific complaints.  Current Outpatient Prescriptions  Medication Sig Dispense Refill  . amLODipine (NORVASC) 10 MG tablet Take 10 mg by mouth at bedtime.     . Azelastine-Fluticasone (DYMISTA) 137-50 MCG/ACT SUSP Place 1 spray into the nose as needed (allergies).     . Bepotastine Besilate (BEPREVE) 1.5 % SOLN Apply 1 drop to eye as needed (allergies).     . calcium elemental as carbonate (TUMS ULTRA 1000) 400 MG chewable tablet Chew 2,000 mg by mouth 3 (three) times daily.    Marland Kitchen labetalol (NORMODYNE) 300 MG tablet Take 600 mg by mouth 2 (two) times daily.     . mycophenolate (MYFORTIC) 180 MG EC tablet Take 540 mg by mouth 2 (two) times daily. Takes 3 (180 mg tablets) twice daily.    . predniSONE (DELTASONE) 5 MG tablet Take 20 mg by mouth daily with breakfast.    . sulfamethoxazole-trimethoprim (BACTRIM,SEPTRA) 400-80 MG tablet Take 1 tablet by mouth every Monday, Wednesday, and Friday.  4  . tacrolimus (PROGRAF) 1 MG capsule Take by mouth. Take two 1mg  tablet bid    . fluconazole (DIFLUCAN) 50 MG tablet Take 50 mg by mouth daily. Reported on 03/25/2016  0  . oxyCODONE-acetaminophen (ROXICET) 5-325 MG tablet Take 1-2 tablets by mouth every 4 (four) hours as needed. (Patient not taking: Reported on 03/25/2016) 30 tablet 0  . pantoprazole (PROTONIX) 40 MG tablet TAKE 1 TABLET BY MOUTH TWICE A DAY FOR 8 WEEKS UNTIL 3/23, THEN 1 DAILY  UNLESS OTHERWISE DIRECTED  0  . sodium bicarbonate 650 MG tablet Take 650-1,300 mg by mouth 2 (two) times daily. Reported on 03/25/2016  0  . valGANciclovir (VALCYTE) 450 MG tablet Take 450 mg by mouth every Monday, Wednesday, and Friday. Reported on 03/25/2016     No current facility-administered medications for this visit.    REVIEW OF SYSTEMS:  [X]  denotes positive finding, [ ]  denotes negative finding Cardiac  Comments:  Chest pain or chest pressure:    Shortness of breath upon exertion:    Short of breath when lying flat:    Irregular heart rhythm:    Constitutional    Fever or chills:      PHYSICAL EXAM: Filed Vitals:   03/25/16 1610  BP: 158/97  Pulse: 68  Height: 5\' 10"  (1.778 m)  Weight: 217 lb (98.431 kg)  SpO2: 97%    GENERAL: The patient is a well-nourished male, in no acute distress. The vital signs are documented above. CARDIOVASCULAR: There is a regular rate and rhythm. PULMONARY: There is good air exchange bilaterally without wheezing or rales. This fistula in the left forearm has an excellent thrill.  MEDICAL ISSUES:  STATUS POST REVISION OF LEFT RADIOCEPHALIC AV FISTULA: Patient is doing well status post revision of his left radiocephalic AV fistula. I think it is safe to begin using this for dialysis. Once we know  that this is working well arrangements can be made to remove his catheter. I will see him back as needed.  [name] [facility] [place]: [phone]

## 2016-12-16 DIAGNOSIS — R739 Hyperglycemia, unspecified: Secondary | ICD-10-CM | POA: Diagnosis not present

## 2016-12-16 DIAGNOSIS — N2581 Secondary hyperparathyroidism of renal origin: Secondary | ICD-10-CM | POA: Diagnosis not present

## 2016-12-16 DIAGNOSIS — D631 Anemia in chronic kidney disease: Secondary | ICD-10-CM | POA: Diagnosis not present

## 2016-12-16 DIAGNOSIS — N186 End stage renal disease: Secondary | ICD-10-CM | POA: Diagnosis not present

## 2016-12-18 DIAGNOSIS — D631 Anemia in chronic kidney disease: Secondary | ICD-10-CM | POA: Diagnosis not present

## 2016-12-18 DIAGNOSIS — R739 Hyperglycemia, unspecified: Secondary | ICD-10-CM | POA: Diagnosis not present

## 2016-12-18 DIAGNOSIS — N2581 Secondary hyperparathyroidism of renal origin: Secondary | ICD-10-CM | POA: Diagnosis not present

## 2016-12-18 DIAGNOSIS — N186 End stage renal disease: Secondary | ICD-10-CM | POA: Diagnosis not present

## 2016-12-21 DIAGNOSIS — N2581 Secondary hyperparathyroidism of renal origin: Secondary | ICD-10-CM | POA: Diagnosis not present

## 2016-12-21 DIAGNOSIS — R739 Hyperglycemia, unspecified: Secondary | ICD-10-CM | POA: Diagnosis not present

## 2016-12-21 DIAGNOSIS — D631 Anemia in chronic kidney disease: Secondary | ICD-10-CM | POA: Diagnosis not present

## 2016-12-21 DIAGNOSIS — N186 End stage renal disease: Secondary | ICD-10-CM | POA: Diagnosis not present

## 2016-12-23 DIAGNOSIS — N186 End stage renal disease: Secondary | ICD-10-CM | POA: Diagnosis not present

## 2016-12-23 DIAGNOSIS — N2581 Secondary hyperparathyroidism of renal origin: Secondary | ICD-10-CM | POA: Diagnosis not present

## 2016-12-23 DIAGNOSIS — D631 Anemia in chronic kidney disease: Secondary | ICD-10-CM | POA: Diagnosis not present

## 2016-12-23 DIAGNOSIS — R739 Hyperglycemia, unspecified: Secondary | ICD-10-CM | POA: Diagnosis not present

## 2016-12-25 DIAGNOSIS — D631 Anemia in chronic kidney disease: Secondary | ICD-10-CM | POA: Diagnosis not present

## 2016-12-25 DIAGNOSIS — N186 End stage renal disease: Secondary | ICD-10-CM | POA: Diagnosis not present

## 2016-12-25 DIAGNOSIS — R739 Hyperglycemia, unspecified: Secondary | ICD-10-CM | POA: Diagnosis not present

## 2016-12-25 DIAGNOSIS — N2581 Secondary hyperparathyroidism of renal origin: Secondary | ICD-10-CM | POA: Diagnosis not present

## 2016-12-28 DIAGNOSIS — N2581 Secondary hyperparathyroidism of renal origin: Secondary | ICD-10-CM | POA: Diagnosis not present

## 2016-12-28 DIAGNOSIS — D631 Anemia in chronic kidney disease: Secondary | ICD-10-CM | POA: Diagnosis not present

## 2016-12-28 DIAGNOSIS — N186 End stage renal disease: Secondary | ICD-10-CM | POA: Diagnosis not present

## 2016-12-28 DIAGNOSIS — R739 Hyperglycemia, unspecified: Secondary | ICD-10-CM | POA: Diagnosis not present

## 2017-01-01 DIAGNOSIS — N186 End stage renal disease: Secondary | ICD-10-CM | POA: Diagnosis not present

## 2017-01-01 DIAGNOSIS — D631 Anemia in chronic kidney disease: Secondary | ICD-10-CM | POA: Diagnosis not present

## 2017-01-01 DIAGNOSIS — R739 Hyperglycemia, unspecified: Secondary | ICD-10-CM | POA: Diagnosis not present

## 2017-01-01 DIAGNOSIS — N2581 Secondary hyperparathyroidism of renal origin: Secondary | ICD-10-CM | POA: Diagnosis not present

## 2017-01-04 DIAGNOSIS — N186 End stage renal disease: Secondary | ICD-10-CM | POA: Diagnosis not present

## 2017-01-04 DIAGNOSIS — R739 Hyperglycemia, unspecified: Secondary | ICD-10-CM | POA: Diagnosis not present

## 2017-01-04 DIAGNOSIS — N2581 Secondary hyperparathyroidism of renal origin: Secondary | ICD-10-CM | POA: Diagnosis not present

## 2017-01-04 DIAGNOSIS — D631 Anemia in chronic kidney disease: Secondary | ICD-10-CM | POA: Diagnosis not present

## 2017-01-06 DIAGNOSIS — N2581 Secondary hyperparathyroidism of renal origin: Secondary | ICD-10-CM | POA: Diagnosis not present

## 2017-01-06 DIAGNOSIS — D631 Anemia in chronic kidney disease: Secondary | ICD-10-CM | POA: Diagnosis not present

## 2017-01-06 DIAGNOSIS — N186 End stage renal disease: Secondary | ICD-10-CM | POA: Diagnosis not present

## 2017-01-06 DIAGNOSIS — R739 Hyperglycemia, unspecified: Secondary | ICD-10-CM | POA: Diagnosis not present

## 2017-01-08 DIAGNOSIS — D631 Anemia in chronic kidney disease: Secondary | ICD-10-CM | POA: Diagnosis not present

## 2017-01-08 DIAGNOSIS — N2581 Secondary hyperparathyroidism of renal origin: Secondary | ICD-10-CM | POA: Diagnosis not present

## 2017-01-08 DIAGNOSIS — N186 End stage renal disease: Secondary | ICD-10-CM | POA: Diagnosis not present

## 2017-01-08 DIAGNOSIS — R739 Hyperglycemia, unspecified: Secondary | ICD-10-CM | POA: Diagnosis not present

## 2017-01-11 DIAGNOSIS — N186 End stage renal disease: Secondary | ICD-10-CM | POA: Diagnosis not present

## 2017-01-11 DIAGNOSIS — D631 Anemia in chronic kidney disease: Secondary | ICD-10-CM | POA: Diagnosis not present

## 2017-01-11 DIAGNOSIS — R739 Hyperglycemia, unspecified: Secondary | ICD-10-CM | POA: Diagnosis not present

## 2017-01-11 DIAGNOSIS — N2581 Secondary hyperparathyroidism of renal origin: Secondary | ICD-10-CM | POA: Diagnosis not present

## 2017-01-13 DIAGNOSIS — D631 Anemia in chronic kidney disease: Secondary | ICD-10-CM | POA: Diagnosis not present

## 2017-01-13 DIAGNOSIS — T8610 Unspecified complication of kidney transplant: Secondary | ICD-10-CM | POA: Diagnosis not present

## 2017-01-13 DIAGNOSIS — N2581 Secondary hyperparathyroidism of renal origin: Secondary | ICD-10-CM | POA: Diagnosis not present

## 2017-01-13 DIAGNOSIS — R739 Hyperglycemia, unspecified: Secondary | ICD-10-CM | POA: Diagnosis not present

## 2017-01-13 DIAGNOSIS — Z992 Dependence on renal dialysis: Secondary | ICD-10-CM | POA: Diagnosis not present

## 2017-01-13 DIAGNOSIS — N186 End stage renal disease: Secondary | ICD-10-CM | POA: Diagnosis not present

## 2017-01-15 DIAGNOSIS — D631 Anemia in chronic kidney disease: Secondary | ICD-10-CM | POA: Diagnosis not present

## 2017-01-15 DIAGNOSIS — R739 Hyperglycemia, unspecified: Secondary | ICD-10-CM | POA: Diagnosis not present

## 2017-01-15 DIAGNOSIS — N2581 Secondary hyperparathyroidism of renal origin: Secondary | ICD-10-CM | POA: Diagnosis not present

## 2017-01-15 DIAGNOSIS — Z23 Encounter for immunization: Secondary | ICD-10-CM | POA: Diagnosis not present

## 2017-01-15 DIAGNOSIS — N186 End stage renal disease: Secondary | ICD-10-CM | POA: Diagnosis not present

## 2017-01-18 DIAGNOSIS — R739 Hyperglycemia, unspecified: Secondary | ICD-10-CM | POA: Diagnosis not present

## 2017-01-18 DIAGNOSIS — N2581 Secondary hyperparathyroidism of renal origin: Secondary | ICD-10-CM | POA: Diagnosis not present

## 2017-01-18 DIAGNOSIS — Z23 Encounter for immunization: Secondary | ICD-10-CM | POA: Diagnosis not present

## 2017-01-18 DIAGNOSIS — D631 Anemia in chronic kidney disease: Secondary | ICD-10-CM | POA: Diagnosis not present

## 2017-01-18 DIAGNOSIS — N186 End stage renal disease: Secondary | ICD-10-CM | POA: Diagnosis not present

## 2017-01-20 DIAGNOSIS — N186 End stage renal disease: Secondary | ICD-10-CM | POA: Diagnosis not present

## 2017-01-20 DIAGNOSIS — D631 Anemia in chronic kidney disease: Secondary | ICD-10-CM | POA: Diagnosis not present

## 2017-01-20 DIAGNOSIS — R739 Hyperglycemia, unspecified: Secondary | ICD-10-CM | POA: Diagnosis not present

## 2017-01-20 DIAGNOSIS — N2581 Secondary hyperparathyroidism of renal origin: Secondary | ICD-10-CM | POA: Diagnosis not present

## 2017-01-20 DIAGNOSIS — Z23 Encounter for immunization: Secondary | ICD-10-CM | POA: Diagnosis not present

## 2017-01-22 DIAGNOSIS — N186 End stage renal disease: Secondary | ICD-10-CM | POA: Diagnosis not present

## 2017-01-22 DIAGNOSIS — N2581 Secondary hyperparathyroidism of renal origin: Secondary | ICD-10-CM | POA: Diagnosis not present

## 2017-01-22 DIAGNOSIS — D631 Anemia in chronic kidney disease: Secondary | ICD-10-CM | POA: Diagnosis not present

## 2017-01-22 DIAGNOSIS — Z23 Encounter for immunization: Secondary | ICD-10-CM | POA: Diagnosis not present

## 2017-01-22 DIAGNOSIS — R739 Hyperglycemia, unspecified: Secondary | ICD-10-CM | POA: Diagnosis not present

## 2017-01-25 DIAGNOSIS — R739 Hyperglycemia, unspecified: Secondary | ICD-10-CM | POA: Diagnosis not present

## 2017-01-25 DIAGNOSIS — N2581 Secondary hyperparathyroidism of renal origin: Secondary | ICD-10-CM | POA: Diagnosis not present

## 2017-01-25 DIAGNOSIS — N186 End stage renal disease: Secondary | ICD-10-CM | POA: Diagnosis not present

## 2017-01-25 DIAGNOSIS — Z23 Encounter for immunization: Secondary | ICD-10-CM | POA: Diagnosis not present

## 2017-01-25 DIAGNOSIS — D631 Anemia in chronic kidney disease: Secondary | ICD-10-CM | POA: Diagnosis not present

## 2017-01-26 DIAGNOSIS — I871 Compression of vein: Secondary | ICD-10-CM | POA: Diagnosis not present

## 2017-01-26 DIAGNOSIS — N186 End stage renal disease: Secondary | ICD-10-CM | POA: Diagnosis not present

## 2017-01-26 DIAGNOSIS — T82858A Stenosis of vascular prosthetic devices, implants and grafts, initial encounter: Secondary | ICD-10-CM | POA: Diagnosis not present

## 2017-01-26 DIAGNOSIS — Z992 Dependence on renal dialysis: Secondary | ICD-10-CM | POA: Diagnosis not present

## 2017-01-27 DIAGNOSIS — N2581 Secondary hyperparathyroidism of renal origin: Secondary | ICD-10-CM | POA: Diagnosis not present

## 2017-01-27 DIAGNOSIS — D631 Anemia in chronic kidney disease: Secondary | ICD-10-CM | POA: Diagnosis not present

## 2017-01-27 DIAGNOSIS — Z23 Encounter for immunization: Secondary | ICD-10-CM | POA: Diagnosis not present

## 2017-01-27 DIAGNOSIS — N186 End stage renal disease: Secondary | ICD-10-CM | POA: Diagnosis not present

## 2017-01-27 DIAGNOSIS — R739 Hyperglycemia, unspecified: Secondary | ICD-10-CM | POA: Diagnosis not present

## 2017-01-29 DIAGNOSIS — R739 Hyperglycemia, unspecified: Secondary | ICD-10-CM | POA: Diagnosis not present

## 2017-01-29 DIAGNOSIS — Z23 Encounter for immunization: Secondary | ICD-10-CM | POA: Diagnosis not present

## 2017-01-29 DIAGNOSIS — N186 End stage renal disease: Secondary | ICD-10-CM | POA: Diagnosis not present

## 2017-01-29 DIAGNOSIS — D631 Anemia in chronic kidney disease: Secondary | ICD-10-CM | POA: Diagnosis not present

## 2017-01-29 DIAGNOSIS — N2581 Secondary hyperparathyroidism of renal origin: Secondary | ICD-10-CM | POA: Diagnosis not present

## 2017-02-01 DIAGNOSIS — R739 Hyperglycemia, unspecified: Secondary | ICD-10-CM | POA: Diagnosis not present

## 2017-02-01 DIAGNOSIS — D631 Anemia in chronic kidney disease: Secondary | ICD-10-CM | POA: Diagnosis not present

## 2017-02-01 DIAGNOSIS — N2581 Secondary hyperparathyroidism of renal origin: Secondary | ICD-10-CM | POA: Diagnosis not present

## 2017-02-01 DIAGNOSIS — Z23 Encounter for immunization: Secondary | ICD-10-CM | POA: Diagnosis not present

## 2017-02-01 DIAGNOSIS — N186 End stage renal disease: Secondary | ICD-10-CM | POA: Diagnosis not present

## 2017-02-03 DIAGNOSIS — N2581 Secondary hyperparathyroidism of renal origin: Secondary | ICD-10-CM | POA: Diagnosis not present

## 2017-02-03 DIAGNOSIS — D631 Anemia in chronic kidney disease: Secondary | ICD-10-CM | POA: Diagnosis not present

## 2017-02-03 DIAGNOSIS — R739 Hyperglycemia, unspecified: Secondary | ICD-10-CM | POA: Diagnosis not present

## 2017-02-03 DIAGNOSIS — N186 End stage renal disease: Secondary | ICD-10-CM | POA: Diagnosis not present

## 2017-02-03 DIAGNOSIS — Z23 Encounter for immunization: Secondary | ICD-10-CM | POA: Diagnosis not present

## 2017-02-05 DIAGNOSIS — N2581 Secondary hyperparathyroidism of renal origin: Secondary | ICD-10-CM | POA: Diagnosis not present

## 2017-02-05 DIAGNOSIS — R739 Hyperglycemia, unspecified: Secondary | ICD-10-CM | POA: Diagnosis not present

## 2017-02-05 DIAGNOSIS — D631 Anemia in chronic kidney disease: Secondary | ICD-10-CM | POA: Diagnosis not present

## 2017-02-05 DIAGNOSIS — N186 End stage renal disease: Secondary | ICD-10-CM | POA: Diagnosis not present

## 2017-02-05 DIAGNOSIS — Z23 Encounter for immunization: Secondary | ICD-10-CM | POA: Diagnosis not present

## 2017-02-08 DIAGNOSIS — D631 Anemia in chronic kidney disease: Secondary | ICD-10-CM | POA: Diagnosis not present

## 2017-02-08 DIAGNOSIS — N2581 Secondary hyperparathyroidism of renal origin: Secondary | ICD-10-CM | POA: Diagnosis not present

## 2017-02-08 DIAGNOSIS — N186 End stage renal disease: Secondary | ICD-10-CM | POA: Diagnosis not present

## 2017-02-08 DIAGNOSIS — Z23 Encounter for immunization: Secondary | ICD-10-CM | POA: Diagnosis not present

## 2017-02-08 DIAGNOSIS — R739 Hyperglycemia, unspecified: Secondary | ICD-10-CM | POA: Diagnosis not present

## 2017-02-10 DIAGNOSIS — N186 End stage renal disease: Secondary | ICD-10-CM | POA: Diagnosis not present

## 2017-02-10 DIAGNOSIS — N2581 Secondary hyperparathyroidism of renal origin: Secondary | ICD-10-CM | POA: Diagnosis not present

## 2017-02-10 DIAGNOSIS — Z992 Dependence on renal dialysis: Secondary | ICD-10-CM | POA: Diagnosis not present

## 2017-02-10 DIAGNOSIS — Z23 Encounter for immunization: Secondary | ICD-10-CM | POA: Diagnosis not present

## 2017-02-10 DIAGNOSIS — D631 Anemia in chronic kidney disease: Secondary | ICD-10-CM | POA: Diagnosis not present

## 2017-02-10 DIAGNOSIS — T8610 Unspecified complication of kidney transplant: Secondary | ICD-10-CM | POA: Diagnosis not present

## 2017-02-10 DIAGNOSIS — R739 Hyperglycemia, unspecified: Secondary | ICD-10-CM | POA: Diagnosis not present

## 2017-02-12 DIAGNOSIS — N2581 Secondary hyperparathyroidism of renal origin: Secondary | ICD-10-CM | POA: Diagnosis not present

## 2017-02-12 DIAGNOSIS — R739 Hyperglycemia, unspecified: Secondary | ICD-10-CM | POA: Diagnosis not present

## 2017-02-12 DIAGNOSIS — N186 End stage renal disease: Secondary | ICD-10-CM | POA: Diagnosis not present

## 2017-02-12 DIAGNOSIS — D631 Anemia in chronic kidney disease: Secondary | ICD-10-CM | POA: Diagnosis not present

## 2017-02-15 DIAGNOSIS — N2581 Secondary hyperparathyroidism of renal origin: Secondary | ICD-10-CM | POA: Diagnosis not present

## 2017-02-15 DIAGNOSIS — D631 Anemia in chronic kidney disease: Secondary | ICD-10-CM | POA: Diagnosis not present

## 2017-02-15 DIAGNOSIS — R739 Hyperglycemia, unspecified: Secondary | ICD-10-CM | POA: Diagnosis not present

## 2017-02-15 DIAGNOSIS — N186 End stage renal disease: Secondary | ICD-10-CM | POA: Diagnosis not present

## 2017-02-17 DIAGNOSIS — R739 Hyperglycemia, unspecified: Secondary | ICD-10-CM | POA: Diagnosis not present

## 2017-02-17 DIAGNOSIS — N2581 Secondary hyperparathyroidism of renal origin: Secondary | ICD-10-CM | POA: Diagnosis not present

## 2017-02-17 DIAGNOSIS — N186 End stage renal disease: Secondary | ICD-10-CM | POA: Diagnosis not present

## 2017-02-17 DIAGNOSIS — D631 Anemia in chronic kidney disease: Secondary | ICD-10-CM | POA: Diagnosis not present

## 2017-02-19 DIAGNOSIS — N2581 Secondary hyperparathyroidism of renal origin: Secondary | ICD-10-CM | POA: Diagnosis not present

## 2017-02-19 DIAGNOSIS — R739 Hyperglycemia, unspecified: Secondary | ICD-10-CM | POA: Diagnosis not present

## 2017-02-19 DIAGNOSIS — N186 End stage renal disease: Secondary | ICD-10-CM | POA: Diagnosis not present

## 2017-02-19 DIAGNOSIS — D631 Anemia in chronic kidney disease: Secondary | ICD-10-CM | POA: Diagnosis not present

## 2017-02-22 DIAGNOSIS — R739 Hyperglycemia, unspecified: Secondary | ICD-10-CM | POA: Diagnosis not present

## 2017-02-22 DIAGNOSIS — N186 End stage renal disease: Secondary | ICD-10-CM | POA: Diagnosis not present

## 2017-02-22 DIAGNOSIS — D631 Anemia in chronic kidney disease: Secondary | ICD-10-CM | POA: Diagnosis not present

## 2017-02-22 DIAGNOSIS — N2581 Secondary hyperparathyroidism of renal origin: Secondary | ICD-10-CM | POA: Diagnosis not present

## 2017-02-26 DIAGNOSIS — D631 Anemia in chronic kidney disease: Secondary | ICD-10-CM | POA: Diagnosis not present

## 2017-02-26 DIAGNOSIS — N186 End stage renal disease: Secondary | ICD-10-CM | POA: Diagnosis not present

## 2017-02-26 DIAGNOSIS — R739 Hyperglycemia, unspecified: Secondary | ICD-10-CM | POA: Diagnosis not present

## 2017-02-26 DIAGNOSIS — N2581 Secondary hyperparathyroidism of renal origin: Secondary | ICD-10-CM | POA: Diagnosis not present

## 2017-03-01 DIAGNOSIS — N186 End stage renal disease: Secondary | ICD-10-CM | POA: Diagnosis not present

## 2017-03-01 DIAGNOSIS — R739 Hyperglycemia, unspecified: Secondary | ICD-10-CM | POA: Diagnosis not present

## 2017-03-01 DIAGNOSIS — N2581 Secondary hyperparathyroidism of renal origin: Secondary | ICD-10-CM | POA: Diagnosis not present

## 2017-03-01 DIAGNOSIS — D631 Anemia in chronic kidney disease: Secondary | ICD-10-CM | POA: Diagnosis not present

## 2017-03-03 DIAGNOSIS — N2581 Secondary hyperparathyroidism of renal origin: Secondary | ICD-10-CM | POA: Diagnosis not present

## 2017-03-03 DIAGNOSIS — D631 Anemia in chronic kidney disease: Secondary | ICD-10-CM | POA: Diagnosis not present

## 2017-03-03 DIAGNOSIS — R739 Hyperglycemia, unspecified: Secondary | ICD-10-CM | POA: Diagnosis not present

## 2017-03-03 DIAGNOSIS — N186 End stage renal disease: Secondary | ICD-10-CM | POA: Diagnosis not present

## 2017-03-05 DIAGNOSIS — D631 Anemia in chronic kidney disease: Secondary | ICD-10-CM | POA: Diagnosis not present

## 2017-03-05 DIAGNOSIS — N186 End stage renal disease: Secondary | ICD-10-CM | POA: Diagnosis not present

## 2017-03-05 DIAGNOSIS — N2581 Secondary hyperparathyroidism of renal origin: Secondary | ICD-10-CM | POA: Diagnosis not present

## 2017-03-05 DIAGNOSIS — R739 Hyperglycemia, unspecified: Secondary | ICD-10-CM | POA: Diagnosis not present

## 2017-03-08 DIAGNOSIS — N186 End stage renal disease: Secondary | ICD-10-CM | POA: Diagnosis not present

## 2017-03-08 DIAGNOSIS — D631 Anemia in chronic kidney disease: Secondary | ICD-10-CM | POA: Diagnosis not present

## 2017-03-08 DIAGNOSIS — R739 Hyperglycemia, unspecified: Secondary | ICD-10-CM | POA: Diagnosis not present

## 2017-03-08 DIAGNOSIS — N2581 Secondary hyperparathyroidism of renal origin: Secondary | ICD-10-CM | POA: Diagnosis not present

## 2017-03-10 DIAGNOSIS — N186 End stage renal disease: Secondary | ICD-10-CM | POA: Diagnosis not present

## 2017-03-10 DIAGNOSIS — N2581 Secondary hyperparathyroidism of renal origin: Secondary | ICD-10-CM | POA: Diagnosis not present

## 2017-03-10 DIAGNOSIS — R739 Hyperglycemia, unspecified: Secondary | ICD-10-CM | POA: Diagnosis not present

## 2017-03-10 DIAGNOSIS — D631 Anemia in chronic kidney disease: Secondary | ICD-10-CM | POA: Diagnosis not present

## 2017-03-12 DIAGNOSIS — D631 Anemia in chronic kidney disease: Secondary | ICD-10-CM | POA: Diagnosis not present

## 2017-03-12 DIAGNOSIS — N2581 Secondary hyperparathyroidism of renal origin: Secondary | ICD-10-CM | POA: Diagnosis not present

## 2017-03-12 DIAGNOSIS — R739 Hyperglycemia, unspecified: Secondary | ICD-10-CM | POA: Diagnosis not present

## 2017-03-12 DIAGNOSIS — N186 End stage renal disease: Secondary | ICD-10-CM | POA: Diagnosis not present

## 2017-03-13 DIAGNOSIS — Z992 Dependence on renal dialysis: Secondary | ICD-10-CM | POA: Diagnosis not present

## 2017-03-13 DIAGNOSIS — T8610 Unspecified complication of kidney transplant: Secondary | ICD-10-CM | POA: Diagnosis not present

## 2017-03-13 DIAGNOSIS — N186 End stage renal disease: Secondary | ICD-10-CM | POA: Diagnosis not present

## 2017-03-15 DIAGNOSIS — R739 Hyperglycemia, unspecified: Secondary | ICD-10-CM | POA: Diagnosis not present

## 2017-03-15 DIAGNOSIS — N186 End stage renal disease: Secondary | ICD-10-CM | POA: Diagnosis not present

## 2017-03-15 DIAGNOSIS — D631 Anemia in chronic kidney disease: Secondary | ICD-10-CM | POA: Diagnosis not present

## 2017-03-15 DIAGNOSIS — N2581 Secondary hyperparathyroidism of renal origin: Secondary | ICD-10-CM | POA: Diagnosis not present

## 2017-03-17 DIAGNOSIS — R739 Hyperglycemia, unspecified: Secondary | ICD-10-CM | POA: Diagnosis not present

## 2017-03-17 DIAGNOSIS — N2581 Secondary hyperparathyroidism of renal origin: Secondary | ICD-10-CM | POA: Diagnosis not present

## 2017-03-17 DIAGNOSIS — D631 Anemia in chronic kidney disease: Secondary | ICD-10-CM | POA: Diagnosis not present

## 2017-03-17 DIAGNOSIS — N186 End stage renal disease: Secondary | ICD-10-CM | POA: Diagnosis not present

## 2017-03-19 DIAGNOSIS — N186 End stage renal disease: Secondary | ICD-10-CM | POA: Diagnosis not present

## 2017-03-19 DIAGNOSIS — R739 Hyperglycemia, unspecified: Secondary | ICD-10-CM | POA: Diagnosis not present

## 2017-03-19 DIAGNOSIS — D631 Anemia in chronic kidney disease: Secondary | ICD-10-CM | POA: Diagnosis not present

## 2017-03-19 DIAGNOSIS — N2581 Secondary hyperparathyroidism of renal origin: Secondary | ICD-10-CM | POA: Diagnosis not present

## 2017-03-22 DIAGNOSIS — N186 End stage renal disease: Secondary | ICD-10-CM | POA: Diagnosis not present

## 2017-03-22 DIAGNOSIS — N2581 Secondary hyperparathyroidism of renal origin: Secondary | ICD-10-CM | POA: Diagnosis not present

## 2017-03-22 DIAGNOSIS — R739 Hyperglycemia, unspecified: Secondary | ICD-10-CM | POA: Diagnosis not present

## 2017-03-22 DIAGNOSIS — D631 Anemia in chronic kidney disease: Secondary | ICD-10-CM | POA: Diagnosis not present

## 2017-03-24 DIAGNOSIS — N186 End stage renal disease: Secondary | ICD-10-CM | POA: Diagnosis not present

## 2017-03-24 DIAGNOSIS — D631 Anemia in chronic kidney disease: Secondary | ICD-10-CM | POA: Diagnosis not present

## 2017-03-24 DIAGNOSIS — N2581 Secondary hyperparathyroidism of renal origin: Secondary | ICD-10-CM | POA: Diagnosis not present

## 2017-03-24 DIAGNOSIS — R739 Hyperglycemia, unspecified: Secondary | ICD-10-CM | POA: Diagnosis not present

## 2017-03-26 DIAGNOSIS — N2581 Secondary hyperparathyroidism of renal origin: Secondary | ICD-10-CM | POA: Diagnosis not present

## 2017-03-26 DIAGNOSIS — D631 Anemia in chronic kidney disease: Secondary | ICD-10-CM | POA: Diagnosis not present

## 2017-03-26 DIAGNOSIS — R739 Hyperglycemia, unspecified: Secondary | ICD-10-CM | POA: Diagnosis not present

## 2017-03-26 DIAGNOSIS — N186 End stage renal disease: Secondary | ICD-10-CM | POA: Diagnosis not present

## 2017-03-29 DIAGNOSIS — D631 Anemia in chronic kidney disease: Secondary | ICD-10-CM | POA: Diagnosis not present

## 2017-03-29 DIAGNOSIS — N2581 Secondary hyperparathyroidism of renal origin: Secondary | ICD-10-CM | POA: Diagnosis not present

## 2017-03-29 DIAGNOSIS — N186 End stage renal disease: Secondary | ICD-10-CM | POA: Diagnosis not present

## 2017-03-29 DIAGNOSIS — R739 Hyperglycemia, unspecified: Secondary | ICD-10-CM | POA: Diagnosis not present

## 2017-03-31 DIAGNOSIS — N2581 Secondary hyperparathyroidism of renal origin: Secondary | ICD-10-CM | POA: Diagnosis not present

## 2017-03-31 DIAGNOSIS — D631 Anemia in chronic kidney disease: Secondary | ICD-10-CM | POA: Diagnosis not present

## 2017-03-31 DIAGNOSIS — R739 Hyperglycemia, unspecified: Secondary | ICD-10-CM | POA: Diagnosis not present

## 2017-03-31 DIAGNOSIS — N186 End stage renal disease: Secondary | ICD-10-CM | POA: Diagnosis not present

## 2017-04-02 DIAGNOSIS — D631 Anemia in chronic kidney disease: Secondary | ICD-10-CM | POA: Diagnosis not present

## 2017-04-02 DIAGNOSIS — N2581 Secondary hyperparathyroidism of renal origin: Secondary | ICD-10-CM | POA: Diagnosis not present

## 2017-04-02 DIAGNOSIS — N186 End stage renal disease: Secondary | ICD-10-CM | POA: Diagnosis not present

## 2017-04-02 DIAGNOSIS — R739 Hyperglycemia, unspecified: Secondary | ICD-10-CM | POA: Diagnosis not present

## 2017-04-05 DIAGNOSIS — R739 Hyperglycemia, unspecified: Secondary | ICD-10-CM | POA: Diagnosis not present

## 2017-04-05 DIAGNOSIS — N2581 Secondary hyperparathyroidism of renal origin: Secondary | ICD-10-CM | POA: Diagnosis not present

## 2017-04-05 DIAGNOSIS — D631 Anemia in chronic kidney disease: Secondary | ICD-10-CM | POA: Diagnosis not present

## 2017-04-05 DIAGNOSIS — N186 End stage renal disease: Secondary | ICD-10-CM | POA: Diagnosis not present

## 2017-04-07 DIAGNOSIS — D631 Anemia in chronic kidney disease: Secondary | ICD-10-CM | POA: Diagnosis not present

## 2017-04-07 DIAGNOSIS — R739 Hyperglycemia, unspecified: Secondary | ICD-10-CM | POA: Diagnosis not present

## 2017-04-07 DIAGNOSIS — N2581 Secondary hyperparathyroidism of renal origin: Secondary | ICD-10-CM | POA: Diagnosis not present

## 2017-04-07 DIAGNOSIS — N186 End stage renal disease: Secondary | ICD-10-CM | POA: Diagnosis not present

## 2017-04-09 DIAGNOSIS — D631 Anemia in chronic kidney disease: Secondary | ICD-10-CM | POA: Diagnosis not present

## 2017-04-09 DIAGNOSIS — N2581 Secondary hyperparathyroidism of renal origin: Secondary | ICD-10-CM | POA: Diagnosis not present

## 2017-04-09 DIAGNOSIS — R739 Hyperglycemia, unspecified: Secondary | ICD-10-CM | POA: Diagnosis not present

## 2017-04-09 DIAGNOSIS — N186 End stage renal disease: Secondary | ICD-10-CM | POA: Diagnosis not present

## 2017-04-12 DIAGNOSIS — Z992 Dependence on renal dialysis: Secondary | ICD-10-CM | POA: Diagnosis not present

## 2017-04-12 DIAGNOSIS — N186 End stage renal disease: Secondary | ICD-10-CM | POA: Diagnosis not present

## 2017-04-12 DIAGNOSIS — R739 Hyperglycemia, unspecified: Secondary | ICD-10-CM | POA: Diagnosis not present

## 2017-04-12 DIAGNOSIS — D631 Anemia in chronic kidney disease: Secondary | ICD-10-CM | POA: Diagnosis not present

## 2017-04-12 DIAGNOSIS — T8610 Unspecified complication of kidney transplant: Secondary | ICD-10-CM | POA: Diagnosis not present

## 2017-04-12 DIAGNOSIS — N2581 Secondary hyperparathyroidism of renal origin: Secondary | ICD-10-CM | POA: Diagnosis not present

## 2017-04-14 DIAGNOSIS — N2581 Secondary hyperparathyroidism of renal origin: Secondary | ICD-10-CM | POA: Diagnosis not present

## 2017-04-14 DIAGNOSIS — D631 Anemia in chronic kidney disease: Secondary | ICD-10-CM | POA: Diagnosis not present

## 2017-04-14 DIAGNOSIS — N186 End stage renal disease: Secondary | ICD-10-CM | POA: Diagnosis not present

## 2017-04-16 DIAGNOSIS — N2581 Secondary hyperparathyroidism of renal origin: Secondary | ICD-10-CM | POA: Diagnosis not present

## 2017-04-16 DIAGNOSIS — N186 End stage renal disease: Secondary | ICD-10-CM | POA: Diagnosis not present

## 2017-04-16 DIAGNOSIS — D631 Anemia in chronic kidney disease: Secondary | ICD-10-CM | POA: Diagnosis not present

## 2017-04-19 DIAGNOSIS — N186 End stage renal disease: Secondary | ICD-10-CM | POA: Diagnosis not present

## 2017-04-19 DIAGNOSIS — D631 Anemia in chronic kidney disease: Secondary | ICD-10-CM | POA: Diagnosis not present

## 2017-04-19 DIAGNOSIS — N2581 Secondary hyperparathyroidism of renal origin: Secondary | ICD-10-CM | POA: Diagnosis not present

## 2017-04-21 DIAGNOSIS — N186 End stage renal disease: Secondary | ICD-10-CM | POA: Diagnosis not present

## 2017-04-21 DIAGNOSIS — N2581 Secondary hyperparathyroidism of renal origin: Secondary | ICD-10-CM | POA: Diagnosis not present

## 2017-04-21 DIAGNOSIS — D631 Anemia in chronic kidney disease: Secondary | ICD-10-CM | POA: Diagnosis not present

## 2017-04-23 DIAGNOSIS — D631 Anemia in chronic kidney disease: Secondary | ICD-10-CM | POA: Diagnosis not present

## 2017-04-23 DIAGNOSIS — N186 End stage renal disease: Secondary | ICD-10-CM | POA: Diagnosis not present

## 2017-04-23 DIAGNOSIS — N2581 Secondary hyperparathyroidism of renal origin: Secondary | ICD-10-CM | POA: Diagnosis not present

## 2017-04-26 DIAGNOSIS — N2581 Secondary hyperparathyroidism of renal origin: Secondary | ICD-10-CM | POA: Diagnosis not present

## 2017-04-26 DIAGNOSIS — D631 Anemia in chronic kidney disease: Secondary | ICD-10-CM | POA: Diagnosis not present

## 2017-04-26 DIAGNOSIS — N186 End stage renal disease: Secondary | ICD-10-CM | POA: Diagnosis not present

## 2017-04-28 DIAGNOSIS — N2581 Secondary hyperparathyroidism of renal origin: Secondary | ICD-10-CM | POA: Diagnosis not present

## 2017-04-28 DIAGNOSIS — D631 Anemia in chronic kidney disease: Secondary | ICD-10-CM | POA: Diagnosis not present

## 2017-04-28 DIAGNOSIS — N186 End stage renal disease: Secondary | ICD-10-CM | POA: Diagnosis not present

## 2017-04-30 DIAGNOSIS — N2581 Secondary hyperparathyroidism of renal origin: Secondary | ICD-10-CM | POA: Diagnosis not present

## 2017-04-30 DIAGNOSIS — N186 End stage renal disease: Secondary | ICD-10-CM | POA: Diagnosis not present

## 2017-04-30 DIAGNOSIS — D631 Anemia in chronic kidney disease: Secondary | ICD-10-CM | POA: Diagnosis not present

## 2017-05-03 DIAGNOSIS — N2581 Secondary hyperparathyroidism of renal origin: Secondary | ICD-10-CM | POA: Diagnosis not present

## 2017-05-03 DIAGNOSIS — N186 End stage renal disease: Secondary | ICD-10-CM | POA: Diagnosis not present

## 2017-05-03 DIAGNOSIS — D631 Anemia in chronic kidney disease: Secondary | ICD-10-CM | POA: Diagnosis not present

## 2017-05-05 DIAGNOSIS — N2581 Secondary hyperparathyroidism of renal origin: Secondary | ICD-10-CM | POA: Diagnosis not present

## 2017-05-05 DIAGNOSIS — N186 End stage renal disease: Secondary | ICD-10-CM | POA: Diagnosis not present

## 2017-05-05 DIAGNOSIS — D631 Anemia in chronic kidney disease: Secondary | ICD-10-CM | POA: Diagnosis not present

## 2017-05-07 DIAGNOSIS — D631 Anemia in chronic kidney disease: Secondary | ICD-10-CM | POA: Diagnosis not present

## 2017-05-07 DIAGNOSIS — N186 End stage renal disease: Secondary | ICD-10-CM | POA: Diagnosis not present

## 2017-05-07 DIAGNOSIS — N2581 Secondary hyperparathyroidism of renal origin: Secondary | ICD-10-CM | POA: Diagnosis not present

## 2017-05-10 DIAGNOSIS — N186 End stage renal disease: Secondary | ICD-10-CM | POA: Diagnosis not present

## 2017-05-10 DIAGNOSIS — N2581 Secondary hyperparathyroidism of renal origin: Secondary | ICD-10-CM | POA: Diagnosis not present

## 2017-05-10 DIAGNOSIS — D631 Anemia in chronic kidney disease: Secondary | ICD-10-CM | POA: Diagnosis not present

## 2017-05-12 DIAGNOSIS — N2581 Secondary hyperparathyroidism of renal origin: Secondary | ICD-10-CM | POA: Diagnosis not present

## 2017-05-12 DIAGNOSIS — N186 End stage renal disease: Secondary | ICD-10-CM | POA: Diagnosis not present

## 2017-05-12 DIAGNOSIS — D631 Anemia in chronic kidney disease: Secondary | ICD-10-CM | POA: Diagnosis not present

## 2017-05-13 DIAGNOSIS — N186 End stage renal disease: Secondary | ICD-10-CM | POA: Diagnosis not present

## 2017-05-13 DIAGNOSIS — Z992 Dependence on renal dialysis: Secondary | ICD-10-CM | POA: Diagnosis not present

## 2017-05-13 DIAGNOSIS — T8610 Unspecified complication of kidney transplant: Secondary | ICD-10-CM | POA: Diagnosis not present

## 2017-05-14 DIAGNOSIS — N2581 Secondary hyperparathyroidism of renal origin: Secondary | ICD-10-CM | POA: Diagnosis not present

## 2017-05-14 DIAGNOSIS — N186 End stage renal disease: Secondary | ICD-10-CM | POA: Diagnosis not present

## 2017-05-14 DIAGNOSIS — D631 Anemia in chronic kidney disease: Secondary | ICD-10-CM | POA: Diagnosis not present

## 2017-05-14 DIAGNOSIS — R739 Hyperglycemia, unspecified: Secondary | ICD-10-CM | POA: Diagnosis not present

## 2017-05-17 DIAGNOSIS — N186 End stage renal disease: Secondary | ICD-10-CM | POA: Diagnosis not present

## 2017-05-17 DIAGNOSIS — N2581 Secondary hyperparathyroidism of renal origin: Secondary | ICD-10-CM | POA: Diagnosis not present

## 2017-05-17 DIAGNOSIS — R739 Hyperglycemia, unspecified: Secondary | ICD-10-CM | POA: Diagnosis not present

## 2017-05-17 DIAGNOSIS — D631 Anemia in chronic kidney disease: Secondary | ICD-10-CM | POA: Diagnosis not present

## 2017-05-19 DIAGNOSIS — N2581 Secondary hyperparathyroidism of renal origin: Secondary | ICD-10-CM | POA: Diagnosis not present

## 2017-05-19 DIAGNOSIS — N186 End stage renal disease: Secondary | ICD-10-CM | POA: Diagnosis not present

## 2017-05-19 DIAGNOSIS — R739 Hyperglycemia, unspecified: Secondary | ICD-10-CM | POA: Diagnosis not present

## 2017-05-19 DIAGNOSIS — D631 Anemia in chronic kidney disease: Secondary | ICD-10-CM | POA: Diagnosis not present

## 2017-05-21 DIAGNOSIS — N2581 Secondary hyperparathyroidism of renal origin: Secondary | ICD-10-CM | POA: Diagnosis not present

## 2017-05-21 DIAGNOSIS — R739 Hyperglycemia, unspecified: Secondary | ICD-10-CM | POA: Diagnosis not present

## 2017-05-21 DIAGNOSIS — N186 End stage renal disease: Secondary | ICD-10-CM | POA: Diagnosis not present

## 2017-05-21 DIAGNOSIS — D631 Anemia in chronic kidney disease: Secondary | ICD-10-CM | POA: Diagnosis not present

## 2017-05-24 DIAGNOSIS — N2581 Secondary hyperparathyroidism of renal origin: Secondary | ICD-10-CM | POA: Diagnosis not present

## 2017-05-24 DIAGNOSIS — N186 End stage renal disease: Secondary | ICD-10-CM | POA: Diagnosis not present

## 2017-05-24 DIAGNOSIS — R739 Hyperglycemia, unspecified: Secondary | ICD-10-CM | POA: Diagnosis not present

## 2017-05-24 DIAGNOSIS — D631 Anemia in chronic kidney disease: Secondary | ICD-10-CM | POA: Diagnosis not present

## 2017-05-26 DIAGNOSIS — D631 Anemia in chronic kidney disease: Secondary | ICD-10-CM | POA: Diagnosis not present

## 2017-05-26 DIAGNOSIS — N186 End stage renal disease: Secondary | ICD-10-CM | POA: Diagnosis not present

## 2017-05-26 DIAGNOSIS — R739 Hyperglycemia, unspecified: Secondary | ICD-10-CM | POA: Diagnosis not present

## 2017-05-26 DIAGNOSIS — N2581 Secondary hyperparathyroidism of renal origin: Secondary | ICD-10-CM | POA: Diagnosis not present

## 2017-05-28 DIAGNOSIS — N2581 Secondary hyperparathyroidism of renal origin: Secondary | ICD-10-CM | POA: Diagnosis not present

## 2017-05-28 DIAGNOSIS — N186 End stage renal disease: Secondary | ICD-10-CM | POA: Diagnosis not present

## 2017-05-28 DIAGNOSIS — R739 Hyperglycemia, unspecified: Secondary | ICD-10-CM | POA: Diagnosis not present

## 2017-05-28 DIAGNOSIS — D631 Anemia in chronic kidney disease: Secondary | ICD-10-CM | POA: Diagnosis not present

## 2017-05-31 DIAGNOSIS — D631 Anemia in chronic kidney disease: Secondary | ICD-10-CM | POA: Diagnosis not present

## 2017-05-31 DIAGNOSIS — N2581 Secondary hyperparathyroidism of renal origin: Secondary | ICD-10-CM | POA: Diagnosis not present

## 2017-05-31 DIAGNOSIS — R739 Hyperglycemia, unspecified: Secondary | ICD-10-CM | POA: Diagnosis not present

## 2017-05-31 DIAGNOSIS — N186 End stage renal disease: Secondary | ICD-10-CM | POA: Diagnosis not present

## 2017-06-02 DIAGNOSIS — N2581 Secondary hyperparathyroidism of renal origin: Secondary | ICD-10-CM | POA: Diagnosis not present

## 2017-06-02 DIAGNOSIS — N186 End stage renal disease: Secondary | ICD-10-CM | POA: Diagnosis not present

## 2017-06-02 DIAGNOSIS — D631 Anemia in chronic kidney disease: Secondary | ICD-10-CM | POA: Diagnosis not present

## 2017-06-02 DIAGNOSIS — R739 Hyperglycemia, unspecified: Secondary | ICD-10-CM | POA: Diagnosis not present

## 2017-06-04 DIAGNOSIS — N2581 Secondary hyperparathyroidism of renal origin: Secondary | ICD-10-CM | POA: Diagnosis not present

## 2017-06-04 DIAGNOSIS — N186 End stage renal disease: Secondary | ICD-10-CM | POA: Diagnosis not present

## 2017-06-04 DIAGNOSIS — D631 Anemia in chronic kidney disease: Secondary | ICD-10-CM | POA: Diagnosis not present

## 2017-06-04 DIAGNOSIS — R739 Hyperglycemia, unspecified: Secondary | ICD-10-CM | POA: Diagnosis not present

## 2017-06-07 DIAGNOSIS — R739 Hyperglycemia, unspecified: Secondary | ICD-10-CM | POA: Diagnosis not present

## 2017-06-07 DIAGNOSIS — N2581 Secondary hyperparathyroidism of renal origin: Secondary | ICD-10-CM | POA: Diagnosis not present

## 2017-06-07 DIAGNOSIS — D631 Anemia in chronic kidney disease: Secondary | ICD-10-CM | POA: Diagnosis not present

## 2017-06-07 DIAGNOSIS — N186 End stage renal disease: Secondary | ICD-10-CM | POA: Diagnosis not present

## 2017-06-09 DIAGNOSIS — N2581 Secondary hyperparathyroidism of renal origin: Secondary | ICD-10-CM | POA: Diagnosis not present

## 2017-06-09 DIAGNOSIS — D631 Anemia in chronic kidney disease: Secondary | ICD-10-CM | POA: Diagnosis not present

## 2017-06-09 DIAGNOSIS — N186 End stage renal disease: Secondary | ICD-10-CM | POA: Diagnosis not present

## 2017-06-09 DIAGNOSIS — R739 Hyperglycemia, unspecified: Secondary | ICD-10-CM | POA: Diagnosis not present

## 2017-06-11 DIAGNOSIS — D631 Anemia in chronic kidney disease: Secondary | ICD-10-CM | POA: Diagnosis not present

## 2017-06-11 DIAGNOSIS — N2581 Secondary hyperparathyroidism of renal origin: Secondary | ICD-10-CM | POA: Diagnosis not present

## 2017-06-11 DIAGNOSIS — N186 End stage renal disease: Secondary | ICD-10-CM | POA: Diagnosis not present

## 2017-06-11 DIAGNOSIS — R739 Hyperglycemia, unspecified: Secondary | ICD-10-CM | POA: Diagnosis not present

## 2017-06-12 DIAGNOSIS — T8610 Unspecified complication of kidney transplant: Secondary | ICD-10-CM | POA: Diagnosis not present

## 2017-06-12 DIAGNOSIS — Z992 Dependence on renal dialysis: Secondary | ICD-10-CM | POA: Diagnosis not present

## 2017-06-12 DIAGNOSIS — N186 End stage renal disease: Secondary | ICD-10-CM | POA: Diagnosis not present

## 2017-06-14 DIAGNOSIS — D631 Anemia in chronic kidney disease: Secondary | ICD-10-CM | POA: Diagnosis not present

## 2017-06-14 DIAGNOSIS — N186 End stage renal disease: Secondary | ICD-10-CM | POA: Diagnosis not present

## 2017-06-14 DIAGNOSIS — N2581 Secondary hyperparathyroidism of renal origin: Secondary | ICD-10-CM | POA: Diagnosis not present

## 2017-06-14 DIAGNOSIS — R739 Hyperglycemia, unspecified: Secondary | ICD-10-CM | POA: Diagnosis not present

## 2017-06-16 DIAGNOSIS — N186 End stage renal disease: Secondary | ICD-10-CM | POA: Diagnosis not present

## 2017-06-16 DIAGNOSIS — D631 Anemia in chronic kidney disease: Secondary | ICD-10-CM | POA: Diagnosis not present

## 2017-06-16 DIAGNOSIS — N2581 Secondary hyperparathyroidism of renal origin: Secondary | ICD-10-CM | POA: Diagnosis not present

## 2017-06-16 DIAGNOSIS — R739 Hyperglycemia, unspecified: Secondary | ICD-10-CM | POA: Diagnosis not present

## 2017-06-18 DIAGNOSIS — N186 End stage renal disease: Secondary | ICD-10-CM | POA: Diagnosis not present

## 2017-06-18 DIAGNOSIS — D631 Anemia in chronic kidney disease: Secondary | ICD-10-CM | POA: Diagnosis not present

## 2017-06-18 DIAGNOSIS — N2581 Secondary hyperparathyroidism of renal origin: Secondary | ICD-10-CM | POA: Diagnosis not present

## 2017-06-18 DIAGNOSIS — R739 Hyperglycemia, unspecified: Secondary | ICD-10-CM | POA: Diagnosis not present

## 2017-06-21 DIAGNOSIS — N2581 Secondary hyperparathyroidism of renal origin: Secondary | ICD-10-CM | POA: Diagnosis not present

## 2017-06-21 DIAGNOSIS — N186 End stage renal disease: Secondary | ICD-10-CM | POA: Diagnosis not present

## 2017-06-21 DIAGNOSIS — D631 Anemia in chronic kidney disease: Secondary | ICD-10-CM | POA: Diagnosis not present

## 2017-06-21 DIAGNOSIS — R739 Hyperglycemia, unspecified: Secondary | ICD-10-CM | POA: Diagnosis not present

## 2017-06-23 DIAGNOSIS — R739 Hyperglycemia, unspecified: Secondary | ICD-10-CM | POA: Diagnosis not present

## 2017-06-23 DIAGNOSIS — N2581 Secondary hyperparathyroidism of renal origin: Secondary | ICD-10-CM | POA: Diagnosis not present

## 2017-06-23 DIAGNOSIS — N186 End stage renal disease: Secondary | ICD-10-CM | POA: Diagnosis not present

## 2017-06-23 DIAGNOSIS — D631 Anemia in chronic kidney disease: Secondary | ICD-10-CM | POA: Diagnosis not present

## 2017-06-25 DIAGNOSIS — R739 Hyperglycemia, unspecified: Secondary | ICD-10-CM | POA: Diagnosis not present

## 2017-06-25 DIAGNOSIS — N186 End stage renal disease: Secondary | ICD-10-CM | POA: Diagnosis not present

## 2017-06-25 DIAGNOSIS — N2581 Secondary hyperparathyroidism of renal origin: Secondary | ICD-10-CM | POA: Diagnosis not present

## 2017-06-25 DIAGNOSIS — D631 Anemia in chronic kidney disease: Secondary | ICD-10-CM | POA: Diagnosis not present

## 2017-06-28 DIAGNOSIS — N2581 Secondary hyperparathyroidism of renal origin: Secondary | ICD-10-CM | POA: Diagnosis not present

## 2017-06-28 DIAGNOSIS — R739 Hyperglycemia, unspecified: Secondary | ICD-10-CM | POA: Diagnosis not present

## 2017-06-28 DIAGNOSIS — D631 Anemia in chronic kidney disease: Secondary | ICD-10-CM | POA: Diagnosis not present

## 2017-06-28 DIAGNOSIS — N186 End stage renal disease: Secondary | ICD-10-CM | POA: Diagnosis not present

## 2017-06-30 DIAGNOSIS — N2581 Secondary hyperparathyroidism of renal origin: Secondary | ICD-10-CM | POA: Diagnosis not present

## 2017-06-30 DIAGNOSIS — N186 End stage renal disease: Secondary | ICD-10-CM | POA: Diagnosis not present

## 2017-06-30 DIAGNOSIS — R739 Hyperglycemia, unspecified: Secondary | ICD-10-CM | POA: Diagnosis not present

## 2017-06-30 DIAGNOSIS — D631 Anemia in chronic kidney disease: Secondary | ICD-10-CM | POA: Diagnosis not present

## 2017-07-02 DIAGNOSIS — D631 Anemia in chronic kidney disease: Secondary | ICD-10-CM | POA: Diagnosis not present

## 2017-07-02 DIAGNOSIS — N2581 Secondary hyperparathyroidism of renal origin: Secondary | ICD-10-CM | POA: Diagnosis not present

## 2017-07-02 DIAGNOSIS — N186 End stage renal disease: Secondary | ICD-10-CM | POA: Diagnosis not present

## 2017-07-02 DIAGNOSIS — R739 Hyperglycemia, unspecified: Secondary | ICD-10-CM | POA: Diagnosis not present

## 2017-07-05 DIAGNOSIS — N2581 Secondary hyperparathyroidism of renal origin: Secondary | ICD-10-CM | POA: Diagnosis not present

## 2017-07-05 DIAGNOSIS — N186 End stage renal disease: Secondary | ICD-10-CM | POA: Diagnosis not present

## 2017-07-05 DIAGNOSIS — D631 Anemia in chronic kidney disease: Secondary | ICD-10-CM | POA: Diagnosis not present

## 2017-07-05 DIAGNOSIS — R739 Hyperglycemia, unspecified: Secondary | ICD-10-CM | POA: Diagnosis not present

## 2017-07-07 DIAGNOSIS — N2581 Secondary hyperparathyroidism of renal origin: Secondary | ICD-10-CM | POA: Diagnosis not present

## 2017-07-07 DIAGNOSIS — R739 Hyperglycemia, unspecified: Secondary | ICD-10-CM | POA: Diagnosis not present

## 2017-07-07 DIAGNOSIS — D631 Anemia in chronic kidney disease: Secondary | ICD-10-CM | POA: Diagnosis not present

## 2017-07-07 DIAGNOSIS — N186 End stage renal disease: Secondary | ICD-10-CM | POA: Diagnosis not present

## 2017-07-09 DIAGNOSIS — D631 Anemia in chronic kidney disease: Secondary | ICD-10-CM | POA: Diagnosis not present

## 2017-07-09 DIAGNOSIS — N2581 Secondary hyperparathyroidism of renal origin: Secondary | ICD-10-CM | POA: Diagnosis not present

## 2017-07-09 DIAGNOSIS — R739 Hyperglycemia, unspecified: Secondary | ICD-10-CM | POA: Diagnosis not present

## 2017-07-09 DIAGNOSIS — N186 End stage renal disease: Secondary | ICD-10-CM | POA: Diagnosis not present

## 2017-07-12 DIAGNOSIS — R739 Hyperglycemia, unspecified: Secondary | ICD-10-CM | POA: Diagnosis not present

## 2017-07-12 DIAGNOSIS — N2581 Secondary hyperparathyroidism of renal origin: Secondary | ICD-10-CM | POA: Diagnosis not present

## 2017-07-12 DIAGNOSIS — N186 End stage renal disease: Secondary | ICD-10-CM | POA: Diagnosis not present

## 2017-07-12 DIAGNOSIS — D631 Anemia in chronic kidney disease: Secondary | ICD-10-CM | POA: Diagnosis not present

## 2017-07-13 DIAGNOSIS — Z992 Dependence on renal dialysis: Secondary | ICD-10-CM | POA: Diagnosis not present

## 2017-07-13 DIAGNOSIS — T8610 Unspecified complication of kidney transplant: Secondary | ICD-10-CM | POA: Diagnosis not present

## 2017-07-13 DIAGNOSIS — N186 End stage renal disease: Secondary | ICD-10-CM | POA: Diagnosis not present

## 2017-07-14 DIAGNOSIS — R739 Hyperglycemia, unspecified: Secondary | ICD-10-CM | POA: Diagnosis not present

## 2017-07-14 DIAGNOSIS — N186 End stage renal disease: Secondary | ICD-10-CM | POA: Diagnosis not present

## 2017-07-14 DIAGNOSIS — D631 Anemia in chronic kidney disease: Secondary | ICD-10-CM | POA: Diagnosis not present

## 2017-07-14 DIAGNOSIS — N2581 Secondary hyperparathyroidism of renal origin: Secondary | ICD-10-CM | POA: Diagnosis not present

## 2017-07-16 DIAGNOSIS — N2581 Secondary hyperparathyroidism of renal origin: Secondary | ICD-10-CM | POA: Diagnosis not present

## 2017-07-16 DIAGNOSIS — N186 End stage renal disease: Secondary | ICD-10-CM | POA: Diagnosis not present

## 2017-07-16 DIAGNOSIS — D631 Anemia in chronic kidney disease: Secondary | ICD-10-CM | POA: Diagnosis not present

## 2017-07-16 DIAGNOSIS — R739 Hyperglycemia, unspecified: Secondary | ICD-10-CM | POA: Diagnosis not present

## 2017-07-19 DIAGNOSIS — R739 Hyperglycemia, unspecified: Secondary | ICD-10-CM | POA: Diagnosis not present

## 2017-07-19 DIAGNOSIS — N2581 Secondary hyperparathyroidism of renal origin: Secondary | ICD-10-CM | POA: Diagnosis not present

## 2017-07-19 DIAGNOSIS — D631 Anemia in chronic kidney disease: Secondary | ICD-10-CM | POA: Diagnosis not present

## 2017-07-19 DIAGNOSIS — N186 End stage renal disease: Secondary | ICD-10-CM | POA: Diagnosis not present

## 2017-07-21 DIAGNOSIS — N186 End stage renal disease: Secondary | ICD-10-CM | POA: Diagnosis not present

## 2017-07-21 DIAGNOSIS — N2581 Secondary hyperparathyroidism of renal origin: Secondary | ICD-10-CM | POA: Diagnosis not present

## 2017-07-21 DIAGNOSIS — R739 Hyperglycemia, unspecified: Secondary | ICD-10-CM | POA: Diagnosis not present

## 2017-07-21 DIAGNOSIS — D631 Anemia in chronic kidney disease: Secondary | ICD-10-CM | POA: Diagnosis not present

## 2017-07-23 DIAGNOSIS — D631 Anemia in chronic kidney disease: Secondary | ICD-10-CM | POA: Diagnosis not present

## 2017-07-23 DIAGNOSIS — N2581 Secondary hyperparathyroidism of renal origin: Secondary | ICD-10-CM | POA: Diagnosis not present

## 2017-07-23 DIAGNOSIS — N186 End stage renal disease: Secondary | ICD-10-CM | POA: Diagnosis not present

## 2017-07-23 DIAGNOSIS — R739 Hyperglycemia, unspecified: Secondary | ICD-10-CM | POA: Diagnosis not present

## 2017-07-26 DIAGNOSIS — N186 End stage renal disease: Secondary | ICD-10-CM | POA: Diagnosis not present

## 2017-07-26 DIAGNOSIS — N2581 Secondary hyperparathyroidism of renal origin: Secondary | ICD-10-CM | POA: Diagnosis not present

## 2017-07-26 DIAGNOSIS — R739 Hyperglycemia, unspecified: Secondary | ICD-10-CM | POA: Diagnosis not present

## 2017-07-26 DIAGNOSIS — D631 Anemia in chronic kidney disease: Secondary | ICD-10-CM | POA: Diagnosis not present

## 2017-07-28 DIAGNOSIS — D631 Anemia in chronic kidney disease: Secondary | ICD-10-CM | POA: Diagnosis not present

## 2017-07-28 DIAGNOSIS — R739 Hyperglycemia, unspecified: Secondary | ICD-10-CM | POA: Diagnosis not present

## 2017-07-28 DIAGNOSIS — N2581 Secondary hyperparathyroidism of renal origin: Secondary | ICD-10-CM | POA: Diagnosis not present

## 2017-07-28 DIAGNOSIS — N186 End stage renal disease: Secondary | ICD-10-CM | POA: Diagnosis not present

## 2017-07-30 DIAGNOSIS — R739 Hyperglycemia, unspecified: Secondary | ICD-10-CM | POA: Diagnosis not present

## 2017-07-30 DIAGNOSIS — N186 End stage renal disease: Secondary | ICD-10-CM | POA: Diagnosis not present

## 2017-07-30 DIAGNOSIS — N2581 Secondary hyperparathyroidism of renal origin: Secondary | ICD-10-CM | POA: Diagnosis not present

## 2017-07-30 DIAGNOSIS — D631 Anemia in chronic kidney disease: Secondary | ICD-10-CM | POA: Diagnosis not present

## 2017-08-02 DIAGNOSIS — D631 Anemia in chronic kidney disease: Secondary | ICD-10-CM | POA: Diagnosis not present

## 2017-08-02 DIAGNOSIS — N186 End stage renal disease: Secondary | ICD-10-CM | POA: Diagnosis not present

## 2017-08-02 DIAGNOSIS — R739 Hyperglycemia, unspecified: Secondary | ICD-10-CM | POA: Diagnosis not present

## 2017-08-02 DIAGNOSIS — N2581 Secondary hyperparathyroidism of renal origin: Secondary | ICD-10-CM | POA: Diagnosis not present

## 2017-08-04 DIAGNOSIS — D631 Anemia in chronic kidney disease: Secondary | ICD-10-CM | POA: Diagnosis not present

## 2017-08-04 DIAGNOSIS — R739 Hyperglycemia, unspecified: Secondary | ICD-10-CM | POA: Diagnosis not present

## 2017-08-04 DIAGNOSIS — N2581 Secondary hyperparathyroidism of renal origin: Secondary | ICD-10-CM | POA: Diagnosis not present

## 2017-08-04 DIAGNOSIS — N186 End stage renal disease: Secondary | ICD-10-CM | POA: Diagnosis not present

## 2017-08-06 DIAGNOSIS — N186 End stage renal disease: Secondary | ICD-10-CM | POA: Diagnosis not present

## 2017-08-06 DIAGNOSIS — R739 Hyperglycemia, unspecified: Secondary | ICD-10-CM | POA: Diagnosis not present

## 2017-08-06 DIAGNOSIS — D631 Anemia in chronic kidney disease: Secondary | ICD-10-CM | POA: Diagnosis not present

## 2017-08-06 DIAGNOSIS — N2581 Secondary hyperparathyroidism of renal origin: Secondary | ICD-10-CM | POA: Diagnosis not present

## 2017-08-09 DIAGNOSIS — N186 End stage renal disease: Secondary | ICD-10-CM | POA: Diagnosis not present

## 2017-08-09 DIAGNOSIS — R739 Hyperglycemia, unspecified: Secondary | ICD-10-CM | POA: Diagnosis not present

## 2017-08-09 DIAGNOSIS — D631 Anemia in chronic kidney disease: Secondary | ICD-10-CM | POA: Diagnosis not present

## 2017-08-09 DIAGNOSIS — N2581 Secondary hyperparathyroidism of renal origin: Secondary | ICD-10-CM | POA: Diagnosis not present

## 2017-08-11 DIAGNOSIS — N2581 Secondary hyperparathyroidism of renal origin: Secondary | ICD-10-CM | POA: Diagnosis not present

## 2017-08-11 DIAGNOSIS — D631 Anemia in chronic kidney disease: Secondary | ICD-10-CM | POA: Diagnosis not present

## 2017-08-11 DIAGNOSIS — N186 End stage renal disease: Secondary | ICD-10-CM | POA: Diagnosis not present

## 2017-08-11 DIAGNOSIS — R739 Hyperglycemia, unspecified: Secondary | ICD-10-CM | POA: Diagnosis not present

## 2017-08-13 DIAGNOSIS — R739 Hyperglycemia, unspecified: Secondary | ICD-10-CM | POA: Diagnosis not present

## 2017-08-13 DIAGNOSIS — Z992 Dependence on renal dialysis: Secondary | ICD-10-CM | POA: Diagnosis not present

## 2017-08-13 DIAGNOSIS — D631 Anemia in chronic kidney disease: Secondary | ICD-10-CM | POA: Diagnosis not present

## 2017-08-13 DIAGNOSIS — N2581 Secondary hyperparathyroidism of renal origin: Secondary | ICD-10-CM | POA: Diagnosis not present

## 2017-08-13 DIAGNOSIS — N186 End stage renal disease: Secondary | ICD-10-CM | POA: Diagnosis not present

## 2017-08-13 DIAGNOSIS — T8610 Unspecified complication of kidney transplant: Secondary | ICD-10-CM | POA: Diagnosis not present

## 2017-08-16 DIAGNOSIS — R739 Hyperglycemia, unspecified: Secondary | ICD-10-CM | POA: Diagnosis not present

## 2017-08-16 DIAGNOSIS — N186 End stage renal disease: Secondary | ICD-10-CM | POA: Diagnosis not present

## 2017-08-16 DIAGNOSIS — Z23 Encounter for immunization: Secondary | ICD-10-CM | POA: Diagnosis not present

## 2017-08-16 DIAGNOSIS — D631 Anemia in chronic kidney disease: Secondary | ICD-10-CM | POA: Diagnosis not present

## 2017-08-16 DIAGNOSIS — N2581 Secondary hyperparathyroidism of renal origin: Secondary | ICD-10-CM | POA: Diagnosis not present

## 2017-08-18 DIAGNOSIS — D631 Anemia in chronic kidney disease: Secondary | ICD-10-CM | POA: Diagnosis not present

## 2017-08-18 DIAGNOSIS — N2581 Secondary hyperparathyroidism of renal origin: Secondary | ICD-10-CM | POA: Diagnosis not present

## 2017-08-18 DIAGNOSIS — N186 End stage renal disease: Secondary | ICD-10-CM | POA: Diagnosis not present

## 2017-08-18 DIAGNOSIS — R739 Hyperglycemia, unspecified: Secondary | ICD-10-CM | POA: Diagnosis not present

## 2017-08-18 DIAGNOSIS — Z23 Encounter for immunization: Secondary | ICD-10-CM | POA: Diagnosis not present

## 2017-08-20 DIAGNOSIS — N2581 Secondary hyperparathyroidism of renal origin: Secondary | ICD-10-CM | POA: Diagnosis not present

## 2017-08-20 DIAGNOSIS — R739 Hyperglycemia, unspecified: Secondary | ICD-10-CM | POA: Diagnosis not present

## 2017-08-20 DIAGNOSIS — Z23 Encounter for immunization: Secondary | ICD-10-CM | POA: Diagnosis not present

## 2017-08-20 DIAGNOSIS — D631 Anemia in chronic kidney disease: Secondary | ICD-10-CM | POA: Diagnosis not present

## 2017-08-20 DIAGNOSIS — N186 End stage renal disease: Secondary | ICD-10-CM | POA: Diagnosis not present

## 2017-08-23 DIAGNOSIS — D631 Anemia in chronic kidney disease: Secondary | ICD-10-CM | POA: Diagnosis not present

## 2017-08-23 DIAGNOSIS — Z23 Encounter for immunization: Secondary | ICD-10-CM | POA: Diagnosis not present

## 2017-08-23 DIAGNOSIS — N2581 Secondary hyperparathyroidism of renal origin: Secondary | ICD-10-CM | POA: Diagnosis not present

## 2017-08-23 DIAGNOSIS — N186 End stage renal disease: Secondary | ICD-10-CM | POA: Diagnosis not present

## 2017-08-23 DIAGNOSIS — R739 Hyperglycemia, unspecified: Secondary | ICD-10-CM | POA: Diagnosis not present

## 2017-08-25 DIAGNOSIS — D631 Anemia in chronic kidney disease: Secondary | ICD-10-CM | POA: Diagnosis not present

## 2017-08-25 DIAGNOSIS — R739 Hyperglycemia, unspecified: Secondary | ICD-10-CM | POA: Diagnosis not present

## 2017-08-25 DIAGNOSIS — Z23 Encounter for immunization: Secondary | ICD-10-CM | POA: Diagnosis not present

## 2017-08-25 DIAGNOSIS — N2581 Secondary hyperparathyroidism of renal origin: Secondary | ICD-10-CM | POA: Diagnosis not present

## 2017-08-25 DIAGNOSIS — N186 End stage renal disease: Secondary | ICD-10-CM | POA: Diagnosis not present

## 2017-08-27 DIAGNOSIS — N186 End stage renal disease: Secondary | ICD-10-CM | POA: Diagnosis not present

## 2017-08-27 DIAGNOSIS — R739 Hyperglycemia, unspecified: Secondary | ICD-10-CM | POA: Diagnosis not present

## 2017-08-27 DIAGNOSIS — N2581 Secondary hyperparathyroidism of renal origin: Secondary | ICD-10-CM | POA: Diagnosis not present

## 2017-08-27 DIAGNOSIS — D631 Anemia in chronic kidney disease: Secondary | ICD-10-CM | POA: Diagnosis not present

## 2017-08-27 DIAGNOSIS — Z23 Encounter for immunization: Secondary | ICD-10-CM | POA: Diagnosis not present

## 2017-08-30 DIAGNOSIS — N2581 Secondary hyperparathyroidism of renal origin: Secondary | ICD-10-CM | POA: Diagnosis not present

## 2017-08-30 DIAGNOSIS — Z23 Encounter for immunization: Secondary | ICD-10-CM | POA: Diagnosis not present

## 2017-08-30 DIAGNOSIS — D631 Anemia in chronic kidney disease: Secondary | ICD-10-CM | POA: Diagnosis not present

## 2017-08-30 DIAGNOSIS — N186 End stage renal disease: Secondary | ICD-10-CM | POA: Diagnosis not present

## 2017-08-30 DIAGNOSIS — R739 Hyperglycemia, unspecified: Secondary | ICD-10-CM | POA: Diagnosis not present

## 2017-09-01 DIAGNOSIS — N186 End stage renal disease: Secondary | ICD-10-CM | POA: Diagnosis not present

## 2017-09-01 DIAGNOSIS — R739 Hyperglycemia, unspecified: Secondary | ICD-10-CM | POA: Diagnosis not present

## 2017-09-01 DIAGNOSIS — D631 Anemia in chronic kidney disease: Secondary | ICD-10-CM | POA: Diagnosis not present

## 2017-09-01 DIAGNOSIS — N2581 Secondary hyperparathyroidism of renal origin: Secondary | ICD-10-CM | POA: Diagnosis not present

## 2017-09-01 DIAGNOSIS — Z23 Encounter for immunization: Secondary | ICD-10-CM | POA: Diagnosis not present

## 2017-09-03 DIAGNOSIS — Z23 Encounter for immunization: Secondary | ICD-10-CM | POA: Diagnosis not present

## 2017-09-03 DIAGNOSIS — R739 Hyperglycemia, unspecified: Secondary | ICD-10-CM | POA: Diagnosis not present

## 2017-09-03 DIAGNOSIS — N2581 Secondary hyperparathyroidism of renal origin: Secondary | ICD-10-CM | POA: Diagnosis not present

## 2017-09-03 DIAGNOSIS — N186 End stage renal disease: Secondary | ICD-10-CM | POA: Diagnosis not present

## 2017-09-03 DIAGNOSIS — D631 Anemia in chronic kidney disease: Secondary | ICD-10-CM | POA: Diagnosis not present

## 2017-09-06 DIAGNOSIS — D631 Anemia in chronic kidney disease: Secondary | ICD-10-CM | POA: Diagnosis not present

## 2017-09-06 DIAGNOSIS — R739 Hyperglycemia, unspecified: Secondary | ICD-10-CM | POA: Diagnosis not present

## 2017-09-06 DIAGNOSIS — N186 End stage renal disease: Secondary | ICD-10-CM | POA: Diagnosis not present

## 2017-09-06 DIAGNOSIS — Z23 Encounter for immunization: Secondary | ICD-10-CM | POA: Diagnosis not present

## 2017-09-06 DIAGNOSIS — N2581 Secondary hyperparathyroidism of renal origin: Secondary | ICD-10-CM | POA: Diagnosis not present

## 2017-09-08 DIAGNOSIS — N186 End stage renal disease: Secondary | ICD-10-CM | POA: Diagnosis not present

## 2017-09-08 DIAGNOSIS — D631 Anemia in chronic kidney disease: Secondary | ICD-10-CM | POA: Diagnosis not present

## 2017-09-08 DIAGNOSIS — N2581 Secondary hyperparathyroidism of renal origin: Secondary | ICD-10-CM | POA: Diagnosis not present

## 2017-09-08 DIAGNOSIS — R739 Hyperglycemia, unspecified: Secondary | ICD-10-CM | POA: Diagnosis not present

## 2017-09-08 DIAGNOSIS — Z23 Encounter for immunization: Secondary | ICD-10-CM | POA: Diagnosis not present

## 2017-09-10 DIAGNOSIS — R739 Hyperglycemia, unspecified: Secondary | ICD-10-CM | POA: Diagnosis not present

## 2017-09-10 DIAGNOSIS — D631 Anemia in chronic kidney disease: Secondary | ICD-10-CM | POA: Diagnosis not present

## 2017-09-10 DIAGNOSIS — N2581 Secondary hyperparathyroidism of renal origin: Secondary | ICD-10-CM | POA: Diagnosis not present

## 2017-09-10 DIAGNOSIS — N186 End stage renal disease: Secondary | ICD-10-CM | POA: Diagnosis not present

## 2017-09-10 DIAGNOSIS — Z23 Encounter for immunization: Secondary | ICD-10-CM | POA: Diagnosis not present

## 2017-09-12 DIAGNOSIS — T8610 Unspecified complication of kidney transplant: Secondary | ICD-10-CM | POA: Diagnosis not present

## 2017-09-12 DIAGNOSIS — Z992 Dependence on renal dialysis: Secondary | ICD-10-CM | POA: Diagnosis not present

## 2017-09-12 DIAGNOSIS — N186 End stage renal disease: Secondary | ICD-10-CM | POA: Diagnosis not present

## 2017-09-13 DIAGNOSIS — R739 Hyperglycemia, unspecified: Secondary | ICD-10-CM | POA: Diagnosis not present

## 2017-09-13 DIAGNOSIS — N2581 Secondary hyperparathyroidism of renal origin: Secondary | ICD-10-CM | POA: Diagnosis not present

## 2017-09-13 DIAGNOSIS — D631 Anemia in chronic kidney disease: Secondary | ICD-10-CM | POA: Diagnosis not present

## 2017-09-13 DIAGNOSIS — N186 End stage renal disease: Secondary | ICD-10-CM | POA: Diagnosis not present

## 2017-09-15 DIAGNOSIS — N186 End stage renal disease: Secondary | ICD-10-CM | POA: Diagnosis not present

## 2017-09-15 DIAGNOSIS — D631 Anemia in chronic kidney disease: Secondary | ICD-10-CM | POA: Diagnosis not present

## 2017-09-15 DIAGNOSIS — R739 Hyperglycemia, unspecified: Secondary | ICD-10-CM | POA: Diagnosis not present

## 2017-09-15 DIAGNOSIS — N2581 Secondary hyperparathyroidism of renal origin: Secondary | ICD-10-CM | POA: Diagnosis not present

## 2017-09-17 DIAGNOSIS — R739 Hyperglycemia, unspecified: Secondary | ICD-10-CM | POA: Diagnosis not present

## 2017-09-17 DIAGNOSIS — D631 Anemia in chronic kidney disease: Secondary | ICD-10-CM | POA: Diagnosis not present

## 2017-09-17 DIAGNOSIS — N186 End stage renal disease: Secondary | ICD-10-CM | POA: Diagnosis not present

## 2017-09-17 DIAGNOSIS — N2581 Secondary hyperparathyroidism of renal origin: Secondary | ICD-10-CM | POA: Diagnosis not present

## 2017-09-20 DIAGNOSIS — N186 End stage renal disease: Secondary | ICD-10-CM | POA: Diagnosis not present

## 2017-09-20 DIAGNOSIS — R739 Hyperglycemia, unspecified: Secondary | ICD-10-CM | POA: Diagnosis not present

## 2017-09-20 DIAGNOSIS — N2581 Secondary hyperparathyroidism of renal origin: Secondary | ICD-10-CM | POA: Diagnosis not present

## 2017-09-20 DIAGNOSIS — D631 Anemia in chronic kidney disease: Secondary | ICD-10-CM | POA: Diagnosis not present

## 2017-09-22 DIAGNOSIS — R739 Hyperglycemia, unspecified: Secondary | ICD-10-CM | POA: Diagnosis not present

## 2017-09-22 DIAGNOSIS — N186 End stage renal disease: Secondary | ICD-10-CM | POA: Diagnosis not present

## 2017-09-22 DIAGNOSIS — N2581 Secondary hyperparathyroidism of renal origin: Secondary | ICD-10-CM | POA: Diagnosis not present

## 2017-09-22 DIAGNOSIS — D631 Anemia in chronic kidney disease: Secondary | ICD-10-CM | POA: Diagnosis not present

## 2017-09-24 DIAGNOSIS — N186 End stage renal disease: Secondary | ICD-10-CM | POA: Diagnosis not present

## 2017-09-24 DIAGNOSIS — R739 Hyperglycemia, unspecified: Secondary | ICD-10-CM | POA: Diagnosis not present

## 2017-09-24 DIAGNOSIS — N2581 Secondary hyperparathyroidism of renal origin: Secondary | ICD-10-CM | POA: Diagnosis not present

## 2017-09-24 DIAGNOSIS — D631 Anemia in chronic kidney disease: Secondary | ICD-10-CM | POA: Diagnosis not present

## 2017-09-27 DIAGNOSIS — R739 Hyperglycemia, unspecified: Secondary | ICD-10-CM | POA: Diagnosis not present

## 2017-09-27 DIAGNOSIS — N2581 Secondary hyperparathyroidism of renal origin: Secondary | ICD-10-CM | POA: Diagnosis not present

## 2017-09-27 DIAGNOSIS — D631 Anemia in chronic kidney disease: Secondary | ICD-10-CM | POA: Diagnosis not present

## 2017-09-27 DIAGNOSIS — N186 End stage renal disease: Secondary | ICD-10-CM | POA: Diagnosis not present

## 2017-09-29 DIAGNOSIS — N2581 Secondary hyperparathyroidism of renal origin: Secondary | ICD-10-CM | POA: Diagnosis not present

## 2017-09-29 DIAGNOSIS — D631 Anemia in chronic kidney disease: Secondary | ICD-10-CM | POA: Diagnosis not present

## 2017-09-29 DIAGNOSIS — N186 End stage renal disease: Secondary | ICD-10-CM | POA: Diagnosis not present

## 2017-09-29 DIAGNOSIS — R739 Hyperglycemia, unspecified: Secondary | ICD-10-CM | POA: Diagnosis not present

## 2017-10-01 DIAGNOSIS — N2581 Secondary hyperparathyroidism of renal origin: Secondary | ICD-10-CM | POA: Diagnosis not present

## 2017-10-01 DIAGNOSIS — N186 End stage renal disease: Secondary | ICD-10-CM | POA: Diagnosis not present

## 2017-10-01 DIAGNOSIS — D631 Anemia in chronic kidney disease: Secondary | ICD-10-CM | POA: Diagnosis not present

## 2017-10-01 DIAGNOSIS — R739 Hyperglycemia, unspecified: Secondary | ICD-10-CM | POA: Diagnosis not present

## 2017-10-04 DIAGNOSIS — N186 End stage renal disease: Secondary | ICD-10-CM | POA: Diagnosis not present

## 2017-10-04 DIAGNOSIS — D631 Anemia in chronic kidney disease: Secondary | ICD-10-CM | POA: Diagnosis not present

## 2017-10-04 DIAGNOSIS — R739 Hyperglycemia, unspecified: Secondary | ICD-10-CM | POA: Diagnosis not present

## 2017-10-04 DIAGNOSIS — N2581 Secondary hyperparathyroidism of renal origin: Secondary | ICD-10-CM | POA: Diagnosis not present

## 2017-10-06 DIAGNOSIS — N2581 Secondary hyperparathyroidism of renal origin: Secondary | ICD-10-CM | POA: Diagnosis not present

## 2017-10-06 DIAGNOSIS — D631 Anemia in chronic kidney disease: Secondary | ICD-10-CM | POA: Diagnosis not present

## 2017-10-06 DIAGNOSIS — R739 Hyperglycemia, unspecified: Secondary | ICD-10-CM | POA: Diagnosis not present

## 2017-10-06 DIAGNOSIS — N186 End stage renal disease: Secondary | ICD-10-CM | POA: Diagnosis not present

## 2017-10-08 DIAGNOSIS — D631 Anemia in chronic kidney disease: Secondary | ICD-10-CM | POA: Diagnosis not present

## 2017-10-08 DIAGNOSIS — N2581 Secondary hyperparathyroidism of renal origin: Secondary | ICD-10-CM | POA: Diagnosis not present

## 2017-10-08 DIAGNOSIS — N186 End stage renal disease: Secondary | ICD-10-CM | POA: Diagnosis not present

## 2017-10-08 DIAGNOSIS — R739 Hyperglycemia, unspecified: Secondary | ICD-10-CM | POA: Diagnosis not present

## 2017-10-11 DIAGNOSIS — R739 Hyperglycemia, unspecified: Secondary | ICD-10-CM | POA: Diagnosis not present

## 2017-10-11 DIAGNOSIS — N2581 Secondary hyperparathyroidism of renal origin: Secondary | ICD-10-CM | POA: Diagnosis not present

## 2017-10-11 DIAGNOSIS — D631 Anemia in chronic kidney disease: Secondary | ICD-10-CM | POA: Diagnosis not present

## 2017-10-11 DIAGNOSIS — N186 End stage renal disease: Secondary | ICD-10-CM | POA: Diagnosis not present

## 2017-10-13 DIAGNOSIS — D631 Anemia in chronic kidney disease: Secondary | ICD-10-CM | POA: Diagnosis not present

## 2017-10-13 DIAGNOSIS — Z992 Dependence on renal dialysis: Secondary | ICD-10-CM | POA: Diagnosis not present

## 2017-10-13 DIAGNOSIS — N2581 Secondary hyperparathyroidism of renal origin: Secondary | ICD-10-CM | POA: Diagnosis not present

## 2017-10-13 DIAGNOSIS — R739 Hyperglycemia, unspecified: Secondary | ICD-10-CM | POA: Diagnosis not present

## 2017-10-13 DIAGNOSIS — N186 End stage renal disease: Secondary | ICD-10-CM | POA: Diagnosis not present

## 2017-10-13 DIAGNOSIS — T8610 Unspecified complication of kidney transplant: Secondary | ICD-10-CM | POA: Diagnosis not present

## 2017-10-15 DIAGNOSIS — D631 Anemia in chronic kidney disease: Secondary | ICD-10-CM | POA: Diagnosis not present

## 2017-10-15 DIAGNOSIS — N186 End stage renal disease: Secondary | ICD-10-CM | POA: Diagnosis not present

## 2017-10-15 DIAGNOSIS — N2581 Secondary hyperparathyroidism of renal origin: Secondary | ICD-10-CM | POA: Diagnosis not present

## 2017-10-18 DIAGNOSIS — N186 End stage renal disease: Secondary | ICD-10-CM | POA: Diagnosis not present

## 2017-10-18 DIAGNOSIS — N2581 Secondary hyperparathyroidism of renal origin: Secondary | ICD-10-CM | POA: Diagnosis not present

## 2017-10-18 DIAGNOSIS — D631 Anemia in chronic kidney disease: Secondary | ICD-10-CM | POA: Diagnosis not present

## 2017-10-20 DIAGNOSIS — D631 Anemia in chronic kidney disease: Secondary | ICD-10-CM | POA: Diagnosis not present

## 2017-10-20 DIAGNOSIS — N186 End stage renal disease: Secondary | ICD-10-CM | POA: Diagnosis not present

## 2017-10-20 DIAGNOSIS — N2581 Secondary hyperparathyroidism of renal origin: Secondary | ICD-10-CM | POA: Diagnosis not present

## 2017-10-22 DIAGNOSIS — N186 End stage renal disease: Secondary | ICD-10-CM | POA: Diagnosis not present

## 2017-10-22 DIAGNOSIS — D631 Anemia in chronic kidney disease: Secondary | ICD-10-CM | POA: Diagnosis not present

## 2017-10-22 DIAGNOSIS — N2581 Secondary hyperparathyroidism of renal origin: Secondary | ICD-10-CM | POA: Diagnosis not present

## 2017-10-25 DIAGNOSIS — D631 Anemia in chronic kidney disease: Secondary | ICD-10-CM | POA: Diagnosis not present

## 2017-10-25 DIAGNOSIS — N186 End stage renal disease: Secondary | ICD-10-CM | POA: Diagnosis not present

## 2017-10-25 DIAGNOSIS — N2581 Secondary hyperparathyroidism of renal origin: Secondary | ICD-10-CM | POA: Diagnosis not present

## 2017-10-27 DIAGNOSIS — D631 Anemia in chronic kidney disease: Secondary | ICD-10-CM | POA: Diagnosis not present

## 2017-10-27 DIAGNOSIS — N2581 Secondary hyperparathyroidism of renal origin: Secondary | ICD-10-CM | POA: Diagnosis not present

## 2017-10-27 DIAGNOSIS — N186 End stage renal disease: Secondary | ICD-10-CM | POA: Diagnosis not present

## 2017-10-29 DIAGNOSIS — D631 Anemia in chronic kidney disease: Secondary | ICD-10-CM | POA: Diagnosis not present

## 2017-10-29 DIAGNOSIS — N2581 Secondary hyperparathyroidism of renal origin: Secondary | ICD-10-CM | POA: Diagnosis not present

## 2017-10-29 DIAGNOSIS — N186 End stage renal disease: Secondary | ICD-10-CM | POA: Diagnosis not present

## 2017-11-01 DIAGNOSIS — N2581 Secondary hyperparathyroidism of renal origin: Secondary | ICD-10-CM | POA: Diagnosis not present

## 2017-11-01 DIAGNOSIS — D631 Anemia in chronic kidney disease: Secondary | ICD-10-CM | POA: Diagnosis not present

## 2017-11-01 DIAGNOSIS — N186 End stage renal disease: Secondary | ICD-10-CM | POA: Diagnosis not present

## 2017-11-02 DIAGNOSIS — N2581 Secondary hyperparathyroidism of renal origin: Secondary | ICD-10-CM | POA: Diagnosis not present

## 2017-11-02 DIAGNOSIS — D631 Anemia in chronic kidney disease: Secondary | ICD-10-CM | POA: Diagnosis not present

## 2017-11-02 DIAGNOSIS — N186 End stage renal disease: Secondary | ICD-10-CM | POA: Diagnosis not present

## 2017-11-05 DIAGNOSIS — N2581 Secondary hyperparathyroidism of renal origin: Secondary | ICD-10-CM | POA: Diagnosis not present

## 2017-11-05 DIAGNOSIS — D631 Anemia in chronic kidney disease: Secondary | ICD-10-CM | POA: Diagnosis not present

## 2017-11-05 DIAGNOSIS — N186 End stage renal disease: Secondary | ICD-10-CM | POA: Diagnosis not present

## 2017-11-08 DIAGNOSIS — D631 Anemia in chronic kidney disease: Secondary | ICD-10-CM | POA: Diagnosis not present

## 2017-11-08 DIAGNOSIS — N186 End stage renal disease: Secondary | ICD-10-CM | POA: Diagnosis not present

## 2017-11-08 DIAGNOSIS — N2581 Secondary hyperparathyroidism of renal origin: Secondary | ICD-10-CM | POA: Diagnosis not present

## 2017-11-10 DIAGNOSIS — D631 Anemia in chronic kidney disease: Secondary | ICD-10-CM | POA: Diagnosis not present

## 2017-11-10 DIAGNOSIS — N186 End stage renal disease: Secondary | ICD-10-CM | POA: Diagnosis not present

## 2017-11-10 DIAGNOSIS — N2581 Secondary hyperparathyroidism of renal origin: Secondary | ICD-10-CM | POA: Diagnosis not present

## 2017-11-12 DIAGNOSIS — N186 End stage renal disease: Secondary | ICD-10-CM | POA: Diagnosis not present

## 2017-11-12 DIAGNOSIS — T8610 Unspecified complication of kidney transplant: Secondary | ICD-10-CM | POA: Diagnosis not present

## 2017-11-12 DIAGNOSIS — N2581 Secondary hyperparathyroidism of renal origin: Secondary | ICD-10-CM | POA: Diagnosis not present

## 2017-11-12 DIAGNOSIS — D631 Anemia in chronic kidney disease: Secondary | ICD-10-CM | POA: Diagnosis not present

## 2017-11-12 DIAGNOSIS — Z992 Dependence on renal dialysis: Secondary | ICD-10-CM | POA: Diagnosis not present

## 2017-11-15 DIAGNOSIS — R739 Hyperglycemia, unspecified: Secondary | ICD-10-CM | POA: Diagnosis not present

## 2017-11-15 DIAGNOSIS — D631 Anemia in chronic kidney disease: Secondary | ICD-10-CM | POA: Diagnosis not present

## 2017-11-15 DIAGNOSIS — N186 End stage renal disease: Secondary | ICD-10-CM | POA: Diagnosis not present

## 2017-11-15 DIAGNOSIS — N2581 Secondary hyperparathyroidism of renal origin: Secondary | ICD-10-CM | POA: Diagnosis not present

## 2017-11-17 DIAGNOSIS — R739 Hyperglycemia, unspecified: Secondary | ICD-10-CM | POA: Diagnosis not present

## 2017-11-17 DIAGNOSIS — N186 End stage renal disease: Secondary | ICD-10-CM | POA: Diagnosis not present

## 2017-11-17 DIAGNOSIS — N2581 Secondary hyperparathyroidism of renal origin: Secondary | ICD-10-CM | POA: Diagnosis not present

## 2017-11-17 DIAGNOSIS — D631 Anemia in chronic kidney disease: Secondary | ICD-10-CM | POA: Diagnosis not present

## 2017-11-19 DIAGNOSIS — N186 End stage renal disease: Secondary | ICD-10-CM | POA: Diagnosis not present

## 2017-11-19 DIAGNOSIS — D631 Anemia in chronic kidney disease: Secondary | ICD-10-CM | POA: Diagnosis not present

## 2017-11-19 DIAGNOSIS — N2581 Secondary hyperparathyroidism of renal origin: Secondary | ICD-10-CM | POA: Diagnosis not present

## 2017-11-19 DIAGNOSIS — R739 Hyperglycemia, unspecified: Secondary | ICD-10-CM | POA: Diagnosis not present

## 2017-11-23 DIAGNOSIS — R739 Hyperglycemia, unspecified: Secondary | ICD-10-CM | POA: Diagnosis not present

## 2017-11-23 DIAGNOSIS — D631 Anemia in chronic kidney disease: Secondary | ICD-10-CM | POA: Diagnosis not present

## 2017-11-23 DIAGNOSIS — N186 End stage renal disease: Secondary | ICD-10-CM | POA: Diagnosis not present

## 2017-11-23 DIAGNOSIS — N2581 Secondary hyperparathyroidism of renal origin: Secondary | ICD-10-CM | POA: Diagnosis not present

## 2017-11-24 DIAGNOSIS — N186 End stage renal disease: Secondary | ICD-10-CM | POA: Diagnosis not present

## 2017-11-24 DIAGNOSIS — D631 Anemia in chronic kidney disease: Secondary | ICD-10-CM | POA: Diagnosis not present

## 2017-11-24 DIAGNOSIS — R739 Hyperglycemia, unspecified: Secondary | ICD-10-CM | POA: Diagnosis not present

## 2017-11-24 DIAGNOSIS — N2581 Secondary hyperparathyroidism of renal origin: Secondary | ICD-10-CM | POA: Diagnosis not present

## 2017-11-26 DIAGNOSIS — N186 End stage renal disease: Secondary | ICD-10-CM | POA: Diagnosis not present

## 2017-11-26 DIAGNOSIS — N2581 Secondary hyperparathyroidism of renal origin: Secondary | ICD-10-CM | POA: Diagnosis not present

## 2017-11-26 DIAGNOSIS — R739 Hyperglycemia, unspecified: Secondary | ICD-10-CM | POA: Diagnosis not present

## 2017-11-26 DIAGNOSIS — D631 Anemia in chronic kidney disease: Secondary | ICD-10-CM | POA: Diagnosis not present

## 2017-11-29 DIAGNOSIS — R739 Hyperglycemia, unspecified: Secondary | ICD-10-CM | POA: Diagnosis not present

## 2017-11-29 DIAGNOSIS — N186 End stage renal disease: Secondary | ICD-10-CM | POA: Diagnosis not present

## 2017-11-29 DIAGNOSIS — D631 Anemia in chronic kidney disease: Secondary | ICD-10-CM | POA: Diagnosis not present

## 2017-11-29 DIAGNOSIS — N2581 Secondary hyperparathyroidism of renal origin: Secondary | ICD-10-CM | POA: Diagnosis not present

## 2017-12-01 DIAGNOSIS — N2581 Secondary hyperparathyroidism of renal origin: Secondary | ICD-10-CM | POA: Diagnosis not present

## 2017-12-01 DIAGNOSIS — N186 End stage renal disease: Secondary | ICD-10-CM | POA: Diagnosis not present

## 2017-12-01 DIAGNOSIS — R739 Hyperglycemia, unspecified: Secondary | ICD-10-CM | POA: Diagnosis not present

## 2017-12-01 DIAGNOSIS — D631 Anemia in chronic kidney disease: Secondary | ICD-10-CM | POA: Diagnosis not present

## 2017-12-03 DIAGNOSIS — D631 Anemia in chronic kidney disease: Secondary | ICD-10-CM | POA: Diagnosis not present

## 2017-12-03 DIAGNOSIS — N2581 Secondary hyperparathyroidism of renal origin: Secondary | ICD-10-CM | POA: Diagnosis not present

## 2017-12-03 DIAGNOSIS — R739 Hyperglycemia, unspecified: Secondary | ICD-10-CM | POA: Diagnosis not present

## 2017-12-03 DIAGNOSIS — N186 End stage renal disease: Secondary | ICD-10-CM | POA: Diagnosis not present

## 2017-12-05 DIAGNOSIS — N186 End stage renal disease: Secondary | ICD-10-CM | POA: Diagnosis not present

## 2017-12-05 DIAGNOSIS — R739 Hyperglycemia, unspecified: Secondary | ICD-10-CM | POA: Diagnosis not present

## 2017-12-05 DIAGNOSIS — N2581 Secondary hyperparathyroidism of renal origin: Secondary | ICD-10-CM | POA: Diagnosis not present

## 2017-12-05 DIAGNOSIS — D631 Anemia in chronic kidney disease: Secondary | ICD-10-CM | POA: Diagnosis not present

## 2017-12-08 DIAGNOSIS — N186 End stage renal disease: Secondary | ICD-10-CM | POA: Diagnosis not present

## 2017-12-08 DIAGNOSIS — D631 Anemia in chronic kidney disease: Secondary | ICD-10-CM | POA: Diagnosis not present

## 2017-12-08 DIAGNOSIS — R739 Hyperglycemia, unspecified: Secondary | ICD-10-CM | POA: Diagnosis not present

## 2017-12-08 DIAGNOSIS — N2581 Secondary hyperparathyroidism of renal origin: Secondary | ICD-10-CM | POA: Diagnosis not present

## 2017-12-10 DIAGNOSIS — N186 End stage renal disease: Secondary | ICD-10-CM | POA: Diagnosis not present

## 2017-12-10 DIAGNOSIS — D631 Anemia in chronic kidney disease: Secondary | ICD-10-CM | POA: Diagnosis not present

## 2017-12-10 DIAGNOSIS — R739 Hyperglycemia, unspecified: Secondary | ICD-10-CM | POA: Diagnosis not present

## 2017-12-10 DIAGNOSIS — N2581 Secondary hyperparathyroidism of renal origin: Secondary | ICD-10-CM | POA: Diagnosis not present

## 2017-12-12 DIAGNOSIS — N186 End stage renal disease: Secondary | ICD-10-CM | POA: Diagnosis not present

## 2017-12-12 DIAGNOSIS — N2581 Secondary hyperparathyroidism of renal origin: Secondary | ICD-10-CM | POA: Diagnosis not present

## 2017-12-12 DIAGNOSIS — D631 Anemia in chronic kidney disease: Secondary | ICD-10-CM | POA: Diagnosis not present

## 2017-12-12 DIAGNOSIS — R739 Hyperglycemia, unspecified: Secondary | ICD-10-CM | POA: Diagnosis not present

## 2017-12-13 DIAGNOSIS — T8610 Unspecified complication of kidney transplant: Secondary | ICD-10-CM | POA: Diagnosis not present

## 2017-12-13 DIAGNOSIS — N186 End stage renal disease: Secondary | ICD-10-CM | POA: Diagnosis not present

## 2017-12-13 DIAGNOSIS — Z992 Dependence on renal dialysis: Secondary | ICD-10-CM | POA: Diagnosis not present

## 2017-12-15 DIAGNOSIS — N186 End stage renal disease: Secondary | ICD-10-CM | POA: Diagnosis not present

## 2017-12-15 DIAGNOSIS — D631 Anemia in chronic kidney disease: Secondary | ICD-10-CM | POA: Diagnosis not present

## 2017-12-15 DIAGNOSIS — N2581 Secondary hyperparathyroidism of renal origin: Secondary | ICD-10-CM | POA: Diagnosis not present

## 2017-12-15 DIAGNOSIS — R739 Hyperglycemia, unspecified: Secondary | ICD-10-CM | POA: Diagnosis not present

## 2017-12-17 DIAGNOSIS — N2581 Secondary hyperparathyroidism of renal origin: Secondary | ICD-10-CM | POA: Diagnosis not present

## 2017-12-17 DIAGNOSIS — D631 Anemia in chronic kidney disease: Secondary | ICD-10-CM | POA: Diagnosis not present

## 2017-12-17 DIAGNOSIS — N186 End stage renal disease: Secondary | ICD-10-CM | POA: Diagnosis not present

## 2017-12-17 DIAGNOSIS — R739 Hyperglycemia, unspecified: Secondary | ICD-10-CM | POA: Diagnosis not present

## 2017-12-20 DIAGNOSIS — D631 Anemia in chronic kidney disease: Secondary | ICD-10-CM | POA: Diagnosis not present

## 2017-12-20 DIAGNOSIS — R739 Hyperglycemia, unspecified: Secondary | ICD-10-CM | POA: Diagnosis not present

## 2017-12-20 DIAGNOSIS — N186 End stage renal disease: Secondary | ICD-10-CM | POA: Diagnosis not present

## 2017-12-20 DIAGNOSIS — N2581 Secondary hyperparathyroidism of renal origin: Secondary | ICD-10-CM | POA: Diagnosis not present

## 2017-12-21 DIAGNOSIS — N186 End stage renal disease: Secondary | ICD-10-CM | POA: Diagnosis not present

## 2017-12-21 DIAGNOSIS — E8770 Fluid overload, unspecified: Secondary | ICD-10-CM | POA: Diagnosis not present

## 2017-12-21 DIAGNOSIS — N2581 Secondary hyperparathyroidism of renal origin: Secondary | ICD-10-CM | POA: Diagnosis not present

## 2017-12-22 DIAGNOSIS — N2581 Secondary hyperparathyroidism of renal origin: Secondary | ICD-10-CM | POA: Diagnosis not present

## 2017-12-22 DIAGNOSIS — D631 Anemia in chronic kidney disease: Secondary | ICD-10-CM | POA: Diagnosis not present

## 2017-12-22 DIAGNOSIS — N186 End stage renal disease: Secondary | ICD-10-CM | POA: Diagnosis not present

## 2017-12-22 DIAGNOSIS — R739 Hyperglycemia, unspecified: Secondary | ICD-10-CM | POA: Diagnosis not present

## 2017-12-24 DIAGNOSIS — N2581 Secondary hyperparathyroidism of renal origin: Secondary | ICD-10-CM | POA: Diagnosis not present

## 2017-12-24 DIAGNOSIS — N186 End stage renal disease: Secondary | ICD-10-CM | POA: Diagnosis not present

## 2017-12-24 DIAGNOSIS — D631 Anemia in chronic kidney disease: Secondary | ICD-10-CM | POA: Diagnosis not present

## 2017-12-24 DIAGNOSIS — R739 Hyperglycemia, unspecified: Secondary | ICD-10-CM | POA: Diagnosis not present

## 2017-12-27 DIAGNOSIS — N186 End stage renal disease: Secondary | ICD-10-CM | POA: Diagnosis not present

## 2017-12-27 DIAGNOSIS — N2581 Secondary hyperparathyroidism of renal origin: Secondary | ICD-10-CM | POA: Diagnosis not present

## 2017-12-27 DIAGNOSIS — D631 Anemia in chronic kidney disease: Secondary | ICD-10-CM | POA: Diagnosis not present

## 2017-12-27 DIAGNOSIS — R739 Hyperglycemia, unspecified: Secondary | ICD-10-CM | POA: Diagnosis not present

## 2017-12-29 DIAGNOSIS — N2581 Secondary hyperparathyroidism of renal origin: Secondary | ICD-10-CM | POA: Diagnosis not present

## 2017-12-29 DIAGNOSIS — N186 End stage renal disease: Secondary | ICD-10-CM | POA: Diagnosis not present

## 2017-12-29 DIAGNOSIS — R739 Hyperglycemia, unspecified: Secondary | ICD-10-CM | POA: Diagnosis not present

## 2017-12-29 DIAGNOSIS — D631 Anemia in chronic kidney disease: Secondary | ICD-10-CM | POA: Diagnosis not present

## 2017-12-31 DIAGNOSIS — D631 Anemia in chronic kidney disease: Secondary | ICD-10-CM | POA: Diagnosis not present

## 2017-12-31 DIAGNOSIS — N2581 Secondary hyperparathyroidism of renal origin: Secondary | ICD-10-CM | POA: Diagnosis not present

## 2017-12-31 DIAGNOSIS — N186 End stage renal disease: Secondary | ICD-10-CM | POA: Diagnosis not present

## 2017-12-31 DIAGNOSIS — R739 Hyperglycemia, unspecified: Secondary | ICD-10-CM | POA: Diagnosis not present

## 2018-01-03 DIAGNOSIS — D631 Anemia in chronic kidney disease: Secondary | ICD-10-CM | POA: Diagnosis not present

## 2018-01-03 DIAGNOSIS — R739 Hyperglycemia, unspecified: Secondary | ICD-10-CM | POA: Diagnosis not present

## 2018-01-03 DIAGNOSIS — N186 End stage renal disease: Secondary | ICD-10-CM | POA: Diagnosis not present

## 2018-01-03 DIAGNOSIS — N2581 Secondary hyperparathyroidism of renal origin: Secondary | ICD-10-CM | POA: Diagnosis not present

## 2018-01-05 DIAGNOSIS — R739 Hyperglycemia, unspecified: Secondary | ICD-10-CM | POA: Diagnosis not present

## 2018-01-05 DIAGNOSIS — N186 End stage renal disease: Secondary | ICD-10-CM | POA: Diagnosis not present

## 2018-01-05 DIAGNOSIS — N2581 Secondary hyperparathyroidism of renal origin: Secondary | ICD-10-CM | POA: Diagnosis not present

## 2018-01-05 DIAGNOSIS — D631 Anemia in chronic kidney disease: Secondary | ICD-10-CM | POA: Diagnosis not present

## 2018-01-07 DIAGNOSIS — D631 Anemia in chronic kidney disease: Secondary | ICD-10-CM | POA: Diagnosis not present

## 2018-01-07 DIAGNOSIS — N186 End stage renal disease: Secondary | ICD-10-CM | POA: Diagnosis not present

## 2018-01-07 DIAGNOSIS — R739 Hyperglycemia, unspecified: Secondary | ICD-10-CM | POA: Diagnosis not present

## 2018-01-07 DIAGNOSIS — N2581 Secondary hyperparathyroidism of renal origin: Secondary | ICD-10-CM | POA: Diagnosis not present

## 2018-01-10 DIAGNOSIS — R739 Hyperglycemia, unspecified: Secondary | ICD-10-CM | POA: Diagnosis not present

## 2018-01-10 DIAGNOSIS — D631 Anemia in chronic kidney disease: Secondary | ICD-10-CM | POA: Diagnosis not present

## 2018-01-10 DIAGNOSIS — N2581 Secondary hyperparathyroidism of renal origin: Secondary | ICD-10-CM | POA: Diagnosis not present

## 2018-01-10 DIAGNOSIS — N186 End stage renal disease: Secondary | ICD-10-CM | POA: Diagnosis not present

## 2018-01-12 DIAGNOSIS — N2581 Secondary hyperparathyroidism of renal origin: Secondary | ICD-10-CM | POA: Diagnosis not present

## 2018-01-12 DIAGNOSIS — D631 Anemia in chronic kidney disease: Secondary | ICD-10-CM | POA: Diagnosis not present

## 2018-01-12 DIAGNOSIS — R739 Hyperglycemia, unspecified: Secondary | ICD-10-CM | POA: Diagnosis not present

## 2018-01-12 DIAGNOSIS — N186 End stage renal disease: Secondary | ICD-10-CM | POA: Diagnosis not present

## 2018-01-13 DIAGNOSIS — N186 End stage renal disease: Secondary | ICD-10-CM | POA: Diagnosis not present

## 2018-01-13 DIAGNOSIS — Z992 Dependence on renal dialysis: Secondary | ICD-10-CM | POA: Diagnosis not present

## 2018-01-13 DIAGNOSIS — T8610 Unspecified complication of kidney transplant: Secondary | ICD-10-CM | POA: Diagnosis not present

## 2018-01-14 DIAGNOSIS — T8610 Unspecified complication of kidney transplant: Secondary | ICD-10-CM | POA: Diagnosis not present

## 2018-01-14 DIAGNOSIS — Z992 Dependence on renal dialysis: Secondary | ICD-10-CM | POA: Diagnosis not present

## 2018-01-14 DIAGNOSIS — Z23 Encounter for immunization: Secondary | ICD-10-CM | POA: Diagnosis not present

## 2018-01-14 DIAGNOSIS — N186 End stage renal disease: Secondary | ICD-10-CM | POA: Diagnosis not present

## 2018-01-14 DIAGNOSIS — D631 Anemia in chronic kidney disease: Secondary | ICD-10-CM | POA: Diagnosis not present

## 2018-01-14 DIAGNOSIS — R739 Hyperglycemia, unspecified: Secondary | ICD-10-CM | POA: Diagnosis not present

## 2018-01-14 DIAGNOSIS — N2581 Secondary hyperparathyroidism of renal origin: Secondary | ICD-10-CM | POA: Diagnosis not present

## 2018-01-14 DIAGNOSIS — D509 Iron deficiency anemia, unspecified: Secondary | ICD-10-CM | POA: Diagnosis not present

## 2018-01-17 DIAGNOSIS — D631 Anemia in chronic kidney disease: Secondary | ICD-10-CM | POA: Diagnosis not present

## 2018-01-17 DIAGNOSIS — Z23 Encounter for immunization: Secondary | ICD-10-CM | POA: Diagnosis not present

## 2018-01-17 DIAGNOSIS — N2581 Secondary hyperparathyroidism of renal origin: Secondary | ICD-10-CM | POA: Diagnosis not present

## 2018-01-17 DIAGNOSIS — R739 Hyperglycemia, unspecified: Secondary | ICD-10-CM | POA: Diagnosis not present

## 2018-01-17 DIAGNOSIS — D509 Iron deficiency anemia, unspecified: Secondary | ICD-10-CM | POA: Diagnosis not present

## 2018-01-17 DIAGNOSIS — N186 End stage renal disease: Secondary | ICD-10-CM | POA: Diagnosis not present

## 2018-01-19 DIAGNOSIS — D509 Iron deficiency anemia, unspecified: Secondary | ICD-10-CM | POA: Diagnosis not present

## 2018-01-19 DIAGNOSIS — Z23 Encounter for immunization: Secondary | ICD-10-CM | POA: Diagnosis not present

## 2018-01-19 DIAGNOSIS — N186 End stage renal disease: Secondary | ICD-10-CM | POA: Diagnosis not present

## 2018-01-19 DIAGNOSIS — R739 Hyperglycemia, unspecified: Secondary | ICD-10-CM | POA: Diagnosis not present

## 2018-01-19 DIAGNOSIS — N2581 Secondary hyperparathyroidism of renal origin: Secondary | ICD-10-CM | POA: Diagnosis not present

## 2018-01-19 DIAGNOSIS — D631 Anemia in chronic kidney disease: Secondary | ICD-10-CM | POA: Diagnosis not present

## 2018-01-21 DIAGNOSIS — R739 Hyperglycemia, unspecified: Secondary | ICD-10-CM | POA: Diagnosis not present

## 2018-01-21 DIAGNOSIS — D509 Iron deficiency anemia, unspecified: Secondary | ICD-10-CM | POA: Diagnosis not present

## 2018-01-21 DIAGNOSIS — D631 Anemia in chronic kidney disease: Secondary | ICD-10-CM | POA: Diagnosis not present

## 2018-01-21 DIAGNOSIS — N2581 Secondary hyperparathyroidism of renal origin: Secondary | ICD-10-CM | POA: Diagnosis not present

## 2018-01-21 DIAGNOSIS — N186 End stage renal disease: Secondary | ICD-10-CM | POA: Diagnosis not present

## 2018-01-21 DIAGNOSIS — Z23 Encounter for immunization: Secondary | ICD-10-CM | POA: Diagnosis not present

## 2018-01-24 DIAGNOSIS — Z23 Encounter for immunization: Secondary | ICD-10-CM | POA: Diagnosis not present

## 2018-01-24 DIAGNOSIS — D509 Iron deficiency anemia, unspecified: Secondary | ICD-10-CM | POA: Diagnosis not present

## 2018-01-24 DIAGNOSIS — N186 End stage renal disease: Secondary | ICD-10-CM | POA: Diagnosis not present

## 2018-01-24 DIAGNOSIS — N2581 Secondary hyperparathyroidism of renal origin: Secondary | ICD-10-CM | POA: Diagnosis not present

## 2018-01-24 DIAGNOSIS — D631 Anemia in chronic kidney disease: Secondary | ICD-10-CM | POA: Diagnosis not present

## 2018-01-24 DIAGNOSIS — R739 Hyperglycemia, unspecified: Secondary | ICD-10-CM | POA: Diagnosis not present

## 2018-01-26 DIAGNOSIS — Z23 Encounter for immunization: Secondary | ICD-10-CM | POA: Diagnosis not present

## 2018-01-26 DIAGNOSIS — D631 Anemia in chronic kidney disease: Secondary | ICD-10-CM | POA: Diagnosis not present

## 2018-01-26 DIAGNOSIS — R739 Hyperglycemia, unspecified: Secondary | ICD-10-CM | POA: Diagnosis not present

## 2018-01-26 DIAGNOSIS — N2581 Secondary hyperparathyroidism of renal origin: Secondary | ICD-10-CM | POA: Diagnosis not present

## 2018-01-26 DIAGNOSIS — D509 Iron deficiency anemia, unspecified: Secondary | ICD-10-CM | POA: Diagnosis not present

## 2018-01-26 DIAGNOSIS — N186 End stage renal disease: Secondary | ICD-10-CM | POA: Diagnosis not present

## 2018-01-28 DIAGNOSIS — N186 End stage renal disease: Secondary | ICD-10-CM | POA: Diagnosis not present

## 2018-01-28 DIAGNOSIS — N2581 Secondary hyperparathyroidism of renal origin: Secondary | ICD-10-CM | POA: Diagnosis not present

## 2018-01-28 DIAGNOSIS — R739 Hyperglycemia, unspecified: Secondary | ICD-10-CM | POA: Diagnosis not present

## 2018-01-28 DIAGNOSIS — D509 Iron deficiency anemia, unspecified: Secondary | ICD-10-CM | POA: Diagnosis not present

## 2018-01-28 DIAGNOSIS — D631 Anemia in chronic kidney disease: Secondary | ICD-10-CM | POA: Diagnosis not present

## 2018-01-28 DIAGNOSIS — Z23 Encounter for immunization: Secondary | ICD-10-CM | POA: Diagnosis not present

## 2018-01-31 DIAGNOSIS — Z23 Encounter for immunization: Secondary | ICD-10-CM | POA: Diagnosis not present

## 2018-01-31 DIAGNOSIS — N186 End stage renal disease: Secondary | ICD-10-CM | POA: Diagnosis not present

## 2018-01-31 DIAGNOSIS — D631 Anemia in chronic kidney disease: Secondary | ICD-10-CM | POA: Diagnosis not present

## 2018-01-31 DIAGNOSIS — D509 Iron deficiency anemia, unspecified: Secondary | ICD-10-CM | POA: Diagnosis not present

## 2018-01-31 DIAGNOSIS — N2581 Secondary hyperparathyroidism of renal origin: Secondary | ICD-10-CM | POA: Diagnosis not present

## 2018-01-31 DIAGNOSIS — R739 Hyperglycemia, unspecified: Secondary | ICD-10-CM | POA: Diagnosis not present

## 2018-02-02 DIAGNOSIS — N2581 Secondary hyperparathyroidism of renal origin: Secondary | ICD-10-CM | POA: Diagnosis not present

## 2018-02-02 DIAGNOSIS — D631 Anemia in chronic kidney disease: Secondary | ICD-10-CM | POA: Diagnosis not present

## 2018-02-02 DIAGNOSIS — Z23 Encounter for immunization: Secondary | ICD-10-CM | POA: Diagnosis not present

## 2018-02-02 DIAGNOSIS — R739 Hyperglycemia, unspecified: Secondary | ICD-10-CM | POA: Diagnosis not present

## 2018-02-02 DIAGNOSIS — N186 End stage renal disease: Secondary | ICD-10-CM | POA: Diagnosis not present

## 2018-02-02 DIAGNOSIS — D509 Iron deficiency anemia, unspecified: Secondary | ICD-10-CM | POA: Diagnosis not present

## 2018-02-04 DIAGNOSIS — N186 End stage renal disease: Secondary | ICD-10-CM | POA: Diagnosis not present

## 2018-02-04 DIAGNOSIS — D509 Iron deficiency anemia, unspecified: Secondary | ICD-10-CM | POA: Diagnosis not present

## 2018-02-04 DIAGNOSIS — R739 Hyperglycemia, unspecified: Secondary | ICD-10-CM | POA: Diagnosis not present

## 2018-02-04 DIAGNOSIS — N2581 Secondary hyperparathyroidism of renal origin: Secondary | ICD-10-CM | POA: Diagnosis not present

## 2018-02-04 DIAGNOSIS — Z23 Encounter for immunization: Secondary | ICD-10-CM | POA: Diagnosis not present

## 2018-02-04 DIAGNOSIS — D631 Anemia in chronic kidney disease: Secondary | ICD-10-CM | POA: Diagnosis not present

## 2018-02-07 DIAGNOSIS — N186 End stage renal disease: Secondary | ICD-10-CM | POA: Diagnosis not present

## 2018-02-07 DIAGNOSIS — D631 Anemia in chronic kidney disease: Secondary | ICD-10-CM | POA: Diagnosis not present

## 2018-02-07 DIAGNOSIS — N2581 Secondary hyperparathyroidism of renal origin: Secondary | ICD-10-CM | POA: Diagnosis not present

## 2018-02-07 DIAGNOSIS — D509 Iron deficiency anemia, unspecified: Secondary | ICD-10-CM | POA: Diagnosis not present

## 2018-02-07 DIAGNOSIS — Z23 Encounter for immunization: Secondary | ICD-10-CM | POA: Diagnosis not present

## 2018-02-07 DIAGNOSIS — R739 Hyperglycemia, unspecified: Secondary | ICD-10-CM | POA: Diagnosis not present

## 2018-02-09 DIAGNOSIS — R739 Hyperglycemia, unspecified: Secondary | ICD-10-CM | POA: Diagnosis not present

## 2018-02-09 DIAGNOSIS — Z23 Encounter for immunization: Secondary | ICD-10-CM | POA: Diagnosis not present

## 2018-02-09 DIAGNOSIS — N2581 Secondary hyperparathyroidism of renal origin: Secondary | ICD-10-CM | POA: Diagnosis not present

## 2018-02-09 DIAGNOSIS — N186 End stage renal disease: Secondary | ICD-10-CM | POA: Diagnosis not present

## 2018-02-09 DIAGNOSIS — D509 Iron deficiency anemia, unspecified: Secondary | ICD-10-CM | POA: Diagnosis not present

## 2018-02-09 DIAGNOSIS — D631 Anemia in chronic kidney disease: Secondary | ICD-10-CM | POA: Diagnosis not present

## 2018-02-11 DIAGNOSIS — R739 Hyperglycemia, unspecified: Secondary | ICD-10-CM | POA: Diagnosis not present

## 2018-02-11 DIAGNOSIS — D509 Iron deficiency anemia, unspecified: Secondary | ICD-10-CM | POA: Diagnosis not present

## 2018-02-11 DIAGNOSIS — N2581 Secondary hyperparathyroidism of renal origin: Secondary | ICD-10-CM | POA: Diagnosis not present

## 2018-02-11 DIAGNOSIS — D631 Anemia in chronic kidney disease: Secondary | ICD-10-CM | POA: Diagnosis not present

## 2018-02-11 DIAGNOSIS — Z992 Dependence on renal dialysis: Secondary | ICD-10-CM | POA: Diagnosis not present

## 2018-02-11 DIAGNOSIS — N186 End stage renal disease: Secondary | ICD-10-CM | POA: Diagnosis not present

## 2018-02-11 DIAGNOSIS — T8610 Unspecified complication of kidney transplant: Secondary | ICD-10-CM | POA: Diagnosis not present

## 2018-02-14 DIAGNOSIS — D631 Anemia in chronic kidney disease: Secondary | ICD-10-CM | POA: Diagnosis not present

## 2018-02-14 DIAGNOSIS — D509 Iron deficiency anemia, unspecified: Secondary | ICD-10-CM | POA: Diagnosis not present

## 2018-02-14 DIAGNOSIS — N186 End stage renal disease: Secondary | ICD-10-CM | POA: Diagnosis not present

## 2018-02-14 DIAGNOSIS — Z992 Dependence on renal dialysis: Secondary | ICD-10-CM | POA: Diagnosis not present

## 2018-02-14 DIAGNOSIS — N2581 Secondary hyperparathyroidism of renal origin: Secondary | ICD-10-CM | POA: Diagnosis not present

## 2018-02-14 DIAGNOSIS — R739 Hyperglycemia, unspecified: Secondary | ICD-10-CM | POA: Diagnosis not present

## 2018-02-16 DIAGNOSIS — N2581 Secondary hyperparathyroidism of renal origin: Secondary | ICD-10-CM | POA: Diagnosis not present

## 2018-02-16 DIAGNOSIS — R739 Hyperglycemia, unspecified: Secondary | ICD-10-CM | POA: Diagnosis not present

## 2018-02-16 DIAGNOSIS — Z992 Dependence on renal dialysis: Secondary | ICD-10-CM | POA: Diagnosis not present

## 2018-02-16 DIAGNOSIS — N186 End stage renal disease: Secondary | ICD-10-CM | POA: Diagnosis not present

## 2018-02-16 DIAGNOSIS — D509 Iron deficiency anemia, unspecified: Secondary | ICD-10-CM | POA: Diagnosis not present

## 2018-02-16 DIAGNOSIS — D631 Anemia in chronic kidney disease: Secondary | ICD-10-CM | POA: Diagnosis not present

## 2018-02-18 DIAGNOSIS — D509 Iron deficiency anemia, unspecified: Secondary | ICD-10-CM | POA: Diagnosis not present

## 2018-02-18 DIAGNOSIS — Z992 Dependence on renal dialysis: Secondary | ICD-10-CM | POA: Diagnosis not present

## 2018-02-18 DIAGNOSIS — N186 End stage renal disease: Secondary | ICD-10-CM | POA: Diagnosis not present

## 2018-02-18 DIAGNOSIS — R739 Hyperglycemia, unspecified: Secondary | ICD-10-CM | POA: Diagnosis not present

## 2018-02-18 DIAGNOSIS — D631 Anemia in chronic kidney disease: Secondary | ICD-10-CM | POA: Diagnosis not present

## 2018-02-18 DIAGNOSIS — N2581 Secondary hyperparathyroidism of renal origin: Secondary | ICD-10-CM | POA: Diagnosis not present

## 2018-02-21 DIAGNOSIS — N2581 Secondary hyperparathyroidism of renal origin: Secondary | ICD-10-CM | POA: Diagnosis not present

## 2018-02-21 DIAGNOSIS — N186 End stage renal disease: Secondary | ICD-10-CM | POA: Diagnosis not present

## 2018-02-21 DIAGNOSIS — Z992 Dependence on renal dialysis: Secondary | ICD-10-CM | POA: Diagnosis not present

## 2018-02-21 DIAGNOSIS — D631 Anemia in chronic kidney disease: Secondary | ICD-10-CM | POA: Diagnosis not present

## 2018-02-21 DIAGNOSIS — D509 Iron deficiency anemia, unspecified: Secondary | ICD-10-CM | POA: Diagnosis not present

## 2018-02-21 DIAGNOSIS — R739 Hyperglycemia, unspecified: Secondary | ICD-10-CM | POA: Diagnosis not present

## 2018-02-23 DIAGNOSIS — D509 Iron deficiency anemia, unspecified: Secondary | ICD-10-CM | POA: Diagnosis not present

## 2018-02-23 DIAGNOSIS — N2581 Secondary hyperparathyroidism of renal origin: Secondary | ICD-10-CM | POA: Diagnosis not present

## 2018-02-23 DIAGNOSIS — D631 Anemia in chronic kidney disease: Secondary | ICD-10-CM | POA: Diagnosis not present

## 2018-02-23 DIAGNOSIS — Z992 Dependence on renal dialysis: Secondary | ICD-10-CM | POA: Diagnosis not present

## 2018-02-23 DIAGNOSIS — R739 Hyperglycemia, unspecified: Secondary | ICD-10-CM | POA: Diagnosis not present

## 2018-02-23 DIAGNOSIS — N186 End stage renal disease: Secondary | ICD-10-CM | POA: Diagnosis not present

## 2018-02-25 DIAGNOSIS — R739 Hyperglycemia, unspecified: Secondary | ICD-10-CM | POA: Diagnosis not present

## 2018-02-25 DIAGNOSIS — D509 Iron deficiency anemia, unspecified: Secondary | ICD-10-CM | POA: Diagnosis not present

## 2018-02-25 DIAGNOSIS — Z992 Dependence on renal dialysis: Secondary | ICD-10-CM | POA: Diagnosis not present

## 2018-02-25 DIAGNOSIS — N186 End stage renal disease: Secondary | ICD-10-CM | POA: Diagnosis not present

## 2018-02-25 DIAGNOSIS — D631 Anemia in chronic kidney disease: Secondary | ICD-10-CM | POA: Diagnosis not present

## 2018-02-25 DIAGNOSIS — N2581 Secondary hyperparathyroidism of renal origin: Secondary | ICD-10-CM | POA: Diagnosis not present

## 2018-02-28 DIAGNOSIS — R739 Hyperglycemia, unspecified: Secondary | ICD-10-CM | POA: Diagnosis not present

## 2018-02-28 DIAGNOSIS — N186 End stage renal disease: Secondary | ICD-10-CM | POA: Diagnosis not present

## 2018-02-28 DIAGNOSIS — Z992 Dependence on renal dialysis: Secondary | ICD-10-CM | POA: Diagnosis not present

## 2018-02-28 DIAGNOSIS — D509 Iron deficiency anemia, unspecified: Secondary | ICD-10-CM | POA: Diagnosis not present

## 2018-02-28 DIAGNOSIS — D631 Anemia in chronic kidney disease: Secondary | ICD-10-CM | POA: Diagnosis not present

## 2018-02-28 DIAGNOSIS — N2581 Secondary hyperparathyroidism of renal origin: Secondary | ICD-10-CM | POA: Diagnosis not present

## 2018-03-02 DIAGNOSIS — Z992 Dependence on renal dialysis: Secondary | ICD-10-CM | POA: Diagnosis not present

## 2018-03-02 DIAGNOSIS — N2581 Secondary hyperparathyroidism of renal origin: Secondary | ICD-10-CM | POA: Diagnosis not present

## 2018-03-02 DIAGNOSIS — R739 Hyperglycemia, unspecified: Secondary | ICD-10-CM | POA: Diagnosis not present

## 2018-03-02 DIAGNOSIS — D509 Iron deficiency anemia, unspecified: Secondary | ICD-10-CM | POA: Diagnosis not present

## 2018-03-02 DIAGNOSIS — D631 Anemia in chronic kidney disease: Secondary | ICD-10-CM | POA: Diagnosis not present

## 2018-03-02 DIAGNOSIS — N186 End stage renal disease: Secondary | ICD-10-CM | POA: Diagnosis not present

## 2018-03-04 DIAGNOSIS — R739 Hyperglycemia, unspecified: Secondary | ICD-10-CM | POA: Diagnosis not present

## 2018-03-04 DIAGNOSIS — D631 Anemia in chronic kidney disease: Secondary | ICD-10-CM | POA: Diagnosis not present

## 2018-03-04 DIAGNOSIS — D509 Iron deficiency anemia, unspecified: Secondary | ICD-10-CM | POA: Diagnosis not present

## 2018-03-04 DIAGNOSIS — N2581 Secondary hyperparathyroidism of renal origin: Secondary | ICD-10-CM | POA: Diagnosis not present

## 2018-03-04 DIAGNOSIS — N186 End stage renal disease: Secondary | ICD-10-CM | POA: Diagnosis not present

## 2018-03-04 DIAGNOSIS — Z992 Dependence on renal dialysis: Secondary | ICD-10-CM | POA: Diagnosis not present

## 2018-03-07 DIAGNOSIS — Z992 Dependence on renal dialysis: Secondary | ICD-10-CM | POA: Diagnosis not present

## 2018-03-07 DIAGNOSIS — D509 Iron deficiency anemia, unspecified: Secondary | ICD-10-CM | POA: Diagnosis not present

## 2018-03-07 DIAGNOSIS — N2581 Secondary hyperparathyroidism of renal origin: Secondary | ICD-10-CM | POA: Diagnosis not present

## 2018-03-07 DIAGNOSIS — D631 Anemia in chronic kidney disease: Secondary | ICD-10-CM | POA: Diagnosis not present

## 2018-03-07 DIAGNOSIS — N186 End stage renal disease: Secondary | ICD-10-CM | POA: Diagnosis not present

## 2018-03-07 DIAGNOSIS — R739 Hyperglycemia, unspecified: Secondary | ICD-10-CM | POA: Diagnosis not present

## 2018-03-09 DIAGNOSIS — N186 End stage renal disease: Secondary | ICD-10-CM | POA: Diagnosis not present

## 2018-03-09 DIAGNOSIS — D509 Iron deficiency anemia, unspecified: Secondary | ICD-10-CM | POA: Diagnosis not present

## 2018-03-09 DIAGNOSIS — N2581 Secondary hyperparathyroidism of renal origin: Secondary | ICD-10-CM | POA: Diagnosis not present

## 2018-03-09 DIAGNOSIS — D631 Anemia in chronic kidney disease: Secondary | ICD-10-CM | POA: Diagnosis not present

## 2018-03-09 DIAGNOSIS — R739 Hyperglycemia, unspecified: Secondary | ICD-10-CM | POA: Diagnosis not present

## 2018-03-09 DIAGNOSIS — Z992 Dependence on renal dialysis: Secondary | ICD-10-CM | POA: Diagnosis not present

## 2018-03-11 DIAGNOSIS — N2581 Secondary hyperparathyroidism of renal origin: Secondary | ICD-10-CM | POA: Diagnosis not present

## 2018-03-11 DIAGNOSIS — Z992 Dependence on renal dialysis: Secondary | ICD-10-CM | POA: Diagnosis not present

## 2018-03-11 DIAGNOSIS — D509 Iron deficiency anemia, unspecified: Secondary | ICD-10-CM | POA: Diagnosis not present

## 2018-03-11 DIAGNOSIS — R739 Hyperglycemia, unspecified: Secondary | ICD-10-CM | POA: Diagnosis not present

## 2018-03-11 DIAGNOSIS — N186 End stage renal disease: Secondary | ICD-10-CM | POA: Diagnosis not present

## 2018-03-11 DIAGNOSIS — D631 Anemia in chronic kidney disease: Secondary | ICD-10-CM | POA: Diagnosis not present

## 2018-03-14 DIAGNOSIS — Z992 Dependence on renal dialysis: Secondary | ICD-10-CM | POA: Diagnosis not present

## 2018-03-14 DIAGNOSIS — D631 Anemia in chronic kidney disease: Secondary | ICD-10-CM | POA: Diagnosis not present

## 2018-03-14 DIAGNOSIS — N2581 Secondary hyperparathyroidism of renal origin: Secondary | ICD-10-CM | POA: Diagnosis not present

## 2018-03-14 DIAGNOSIS — N186 End stage renal disease: Secondary | ICD-10-CM | POA: Diagnosis not present

## 2018-03-14 DIAGNOSIS — T8610 Unspecified complication of kidney transplant: Secondary | ICD-10-CM | POA: Diagnosis not present

## 2018-03-14 DIAGNOSIS — R739 Hyperglycemia, unspecified: Secondary | ICD-10-CM | POA: Diagnosis not present

## 2018-03-16 DIAGNOSIS — N2581 Secondary hyperparathyroidism of renal origin: Secondary | ICD-10-CM | POA: Diagnosis not present

## 2018-03-16 DIAGNOSIS — D631 Anemia in chronic kidney disease: Secondary | ICD-10-CM | POA: Diagnosis not present

## 2018-03-16 DIAGNOSIS — R739 Hyperglycemia, unspecified: Secondary | ICD-10-CM | POA: Diagnosis not present

## 2018-03-16 DIAGNOSIS — N186 End stage renal disease: Secondary | ICD-10-CM | POA: Diagnosis not present

## 2018-03-18 DIAGNOSIS — N2581 Secondary hyperparathyroidism of renal origin: Secondary | ICD-10-CM | POA: Diagnosis not present

## 2018-03-18 DIAGNOSIS — N186 End stage renal disease: Secondary | ICD-10-CM | POA: Diagnosis not present

## 2018-03-18 DIAGNOSIS — R739 Hyperglycemia, unspecified: Secondary | ICD-10-CM | POA: Diagnosis not present

## 2018-03-18 DIAGNOSIS — D631 Anemia in chronic kidney disease: Secondary | ICD-10-CM | POA: Diagnosis not present

## 2018-03-21 DIAGNOSIS — D631 Anemia in chronic kidney disease: Secondary | ICD-10-CM | POA: Diagnosis not present

## 2018-03-21 DIAGNOSIS — N186 End stage renal disease: Secondary | ICD-10-CM | POA: Diagnosis not present

## 2018-03-21 DIAGNOSIS — R739 Hyperglycemia, unspecified: Secondary | ICD-10-CM | POA: Diagnosis not present

## 2018-03-21 DIAGNOSIS — N2581 Secondary hyperparathyroidism of renal origin: Secondary | ICD-10-CM | POA: Diagnosis not present

## 2018-03-23 DIAGNOSIS — N2581 Secondary hyperparathyroidism of renal origin: Secondary | ICD-10-CM | POA: Diagnosis not present

## 2018-03-23 DIAGNOSIS — R739 Hyperglycemia, unspecified: Secondary | ICD-10-CM | POA: Diagnosis not present

## 2018-03-23 DIAGNOSIS — D631 Anemia in chronic kidney disease: Secondary | ICD-10-CM | POA: Diagnosis not present

## 2018-03-23 DIAGNOSIS — N186 End stage renal disease: Secondary | ICD-10-CM | POA: Diagnosis not present

## 2018-03-25 DIAGNOSIS — R739 Hyperglycemia, unspecified: Secondary | ICD-10-CM | POA: Diagnosis not present

## 2018-03-25 DIAGNOSIS — D631 Anemia in chronic kidney disease: Secondary | ICD-10-CM | POA: Diagnosis not present

## 2018-03-25 DIAGNOSIS — N186 End stage renal disease: Secondary | ICD-10-CM | POA: Diagnosis not present

## 2018-03-25 DIAGNOSIS — N2581 Secondary hyperparathyroidism of renal origin: Secondary | ICD-10-CM | POA: Diagnosis not present

## 2018-03-28 DIAGNOSIS — N2581 Secondary hyperparathyroidism of renal origin: Secondary | ICD-10-CM | POA: Diagnosis not present

## 2018-03-28 DIAGNOSIS — N186 End stage renal disease: Secondary | ICD-10-CM | POA: Diagnosis not present

## 2018-03-28 DIAGNOSIS — D631 Anemia in chronic kidney disease: Secondary | ICD-10-CM | POA: Diagnosis not present

## 2018-03-28 DIAGNOSIS — R739 Hyperglycemia, unspecified: Secondary | ICD-10-CM | POA: Diagnosis not present

## 2018-03-30 DIAGNOSIS — D631 Anemia in chronic kidney disease: Secondary | ICD-10-CM | POA: Diagnosis not present

## 2018-03-30 DIAGNOSIS — N186 End stage renal disease: Secondary | ICD-10-CM | POA: Diagnosis not present

## 2018-03-30 DIAGNOSIS — R739 Hyperglycemia, unspecified: Secondary | ICD-10-CM | POA: Diagnosis not present

## 2018-03-30 DIAGNOSIS — N2581 Secondary hyperparathyroidism of renal origin: Secondary | ICD-10-CM | POA: Diagnosis not present

## 2018-04-01 DIAGNOSIS — R739 Hyperglycemia, unspecified: Secondary | ICD-10-CM | POA: Diagnosis not present

## 2018-04-01 DIAGNOSIS — D631 Anemia in chronic kidney disease: Secondary | ICD-10-CM | POA: Diagnosis not present

## 2018-04-01 DIAGNOSIS — N2581 Secondary hyperparathyroidism of renal origin: Secondary | ICD-10-CM | POA: Diagnosis not present

## 2018-04-01 DIAGNOSIS — N186 End stage renal disease: Secondary | ICD-10-CM | POA: Diagnosis not present

## 2018-04-04 DIAGNOSIS — N2581 Secondary hyperparathyroidism of renal origin: Secondary | ICD-10-CM | POA: Diagnosis not present

## 2018-04-04 DIAGNOSIS — D631 Anemia in chronic kidney disease: Secondary | ICD-10-CM | POA: Diagnosis not present

## 2018-04-04 DIAGNOSIS — R739 Hyperglycemia, unspecified: Secondary | ICD-10-CM | POA: Diagnosis not present

## 2018-04-04 DIAGNOSIS — N186 End stage renal disease: Secondary | ICD-10-CM | POA: Diagnosis not present

## 2018-04-06 DIAGNOSIS — N186 End stage renal disease: Secondary | ICD-10-CM | POA: Diagnosis not present

## 2018-04-06 DIAGNOSIS — R739 Hyperglycemia, unspecified: Secondary | ICD-10-CM | POA: Diagnosis not present

## 2018-04-06 DIAGNOSIS — N2581 Secondary hyperparathyroidism of renal origin: Secondary | ICD-10-CM | POA: Diagnosis not present

## 2018-04-06 DIAGNOSIS — D631 Anemia in chronic kidney disease: Secondary | ICD-10-CM | POA: Diagnosis not present

## 2018-04-08 DIAGNOSIS — R739 Hyperglycemia, unspecified: Secondary | ICD-10-CM | POA: Diagnosis not present

## 2018-04-08 DIAGNOSIS — N186 End stage renal disease: Secondary | ICD-10-CM | POA: Diagnosis not present

## 2018-04-08 DIAGNOSIS — N2581 Secondary hyperparathyroidism of renal origin: Secondary | ICD-10-CM | POA: Diagnosis not present

## 2018-04-08 DIAGNOSIS — D631 Anemia in chronic kidney disease: Secondary | ICD-10-CM | POA: Diagnosis not present

## 2018-04-11 DIAGNOSIS — N186 End stage renal disease: Secondary | ICD-10-CM | POA: Diagnosis not present

## 2018-04-11 DIAGNOSIS — R739 Hyperglycemia, unspecified: Secondary | ICD-10-CM | POA: Diagnosis not present

## 2018-04-11 DIAGNOSIS — N2581 Secondary hyperparathyroidism of renal origin: Secondary | ICD-10-CM | POA: Diagnosis not present

## 2018-04-11 DIAGNOSIS — D631 Anemia in chronic kidney disease: Secondary | ICD-10-CM | POA: Diagnosis not present

## 2018-04-13 DIAGNOSIS — N186 End stage renal disease: Secondary | ICD-10-CM | POA: Diagnosis not present

## 2018-04-13 DIAGNOSIS — D631 Anemia in chronic kidney disease: Secondary | ICD-10-CM | POA: Diagnosis not present

## 2018-04-13 DIAGNOSIS — Z992 Dependence on renal dialysis: Secondary | ICD-10-CM | POA: Diagnosis not present

## 2018-04-13 DIAGNOSIS — T8610 Unspecified complication of kidney transplant: Secondary | ICD-10-CM | POA: Diagnosis not present

## 2018-04-13 DIAGNOSIS — N2581 Secondary hyperparathyroidism of renal origin: Secondary | ICD-10-CM | POA: Diagnosis not present

## 2018-04-13 DIAGNOSIS — R739 Hyperglycemia, unspecified: Secondary | ICD-10-CM | POA: Diagnosis not present

## 2018-04-15 DIAGNOSIS — R739 Hyperglycemia, unspecified: Secondary | ICD-10-CM | POA: Diagnosis not present

## 2018-04-15 DIAGNOSIS — N2581 Secondary hyperparathyroidism of renal origin: Secondary | ICD-10-CM | POA: Diagnosis not present

## 2018-04-15 DIAGNOSIS — D631 Anemia in chronic kidney disease: Secondary | ICD-10-CM | POA: Diagnosis not present

## 2018-04-15 DIAGNOSIS — N186 End stage renal disease: Secondary | ICD-10-CM | POA: Diagnosis not present

## 2018-04-18 DIAGNOSIS — R739 Hyperglycemia, unspecified: Secondary | ICD-10-CM | POA: Diagnosis not present

## 2018-04-18 DIAGNOSIS — D631 Anemia in chronic kidney disease: Secondary | ICD-10-CM | POA: Diagnosis not present

## 2018-04-18 DIAGNOSIS — N2581 Secondary hyperparathyroidism of renal origin: Secondary | ICD-10-CM | POA: Diagnosis not present

## 2018-04-18 DIAGNOSIS — N186 End stage renal disease: Secondary | ICD-10-CM | POA: Diagnosis not present

## 2018-04-20 DIAGNOSIS — D631 Anemia in chronic kidney disease: Secondary | ICD-10-CM | POA: Diagnosis not present

## 2018-04-20 DIAGNOSIS — N186 End stage renal disease: Secondary | ICD-10-CM | POA: Diagnosis not present

## 2018-04-20 DIAGNOSIS — N2581 Secondary hyperparathyroidism of renal origin: Secondary | ICD-10-CM | POA: Diagnosis not present

## 2018-04-20 DIAGNOSIS — R739 Hyperglycemia, unspecified: Secondary | ICD-10-CM | POA: Diagnosis not present

## 2018-04-22 DIAGNOSIS — N2581 Secondary hyperparathyroidism of renal origin: Secondary | ICD-10-CM | POA: Diagnosis not present

## 2018-04-22 DIAGNOSIS — R739 Hyperglycemia, unspecified: Secondary | ICD-10-CM | POA: Diagnosis not present

## 2018-04-22 DIAGNOSIS — N186 End stage renal disease: Secondary | ICD-10-CM | POA: Diagnosis not present

## 2018-04-22 DIAGNOSIS — D631 Anemia in chronic kidney disease: Secondary | ICD-10-CM | POA: Diagnosis not present

## 2018-04-25 DIAGNOSIS — N2581 Secondary hyperparathyroidism of renal origin: Secondary | ICD-10-CM | POA: Diagnosis not present

## 2018-04-25 DIAGNOSIS — N186 End stage renal disease: Secondary | ICD-10-CM | POA: Diagnosis not present

## 2018-04-25 DIAGNOSIS — R739 Hyperglycemia, unspecified: Secondary | ICD-10-CM | POA: Diagnosis not present

## 2018-04-25 DIAGNOSIS — D631 Anemia in chronic kidney disease: Secondary | ICD-10-CM | POA: Diagnosis not present

## 2018-04-27 DIAGNOSIS — D631 Anemia in chronic kidney disease: Secondary | ICD-10-CM | POA: Diagnosis not present

## 2018-04-27 DIAGNOSIS — N186 End stage renal disease: Secondary | ICD-10-CM | POA: Diagnosis not present

## 2018-04-27 DIAGNOSIS — N2581 Secondary hyperparathyroidism of renal origin: Secondary | ICD-10-CM | POA: Diagnosis not present

## 2018-04-27 DIAGNOSIS — R739 Hyperglycemia, unspecified: Secondary | ICD-10-CM | POA: Diagnosis not present

## 2018-04-29 DIAGNOSIS — N186 End stage renal disease: Secondary | ICD-10-CM | POA: Diagnosis not present

## 2018-04-29 DIAGNOSIS — R739 Hyperglycemia, unspecified: Secondary | ICD-10-CM | POA: Diagnosis not present

## 2018-04-29 DIAGNOSIS — D631 Anemia in chronic kidney disease: Secondary | ICD-10-CM | POA: Diagnosis not present

## 2018-04-29 DIAGNOSIS — N2581 Secondary hyperparathyroidism of renal origin: Secondary | ICD-10-CM | POA: Diagnosis not present

## 2018-05-02 DIAGNOSIS — N2581 Secondary hyperparathyroidism of renal origin: Secondary | ICD-10-CM | POA: Diagnosis not present

## 2018-05-02 DIAGNOSIS — D631 Anemia in chronic kidney disease: Secondary | ICD-10-CM | POA: Diagnosis not present

## 2018-05-02 DIAGNOSIS — N186 End stage renal disease: Secondary | ICD-10-CM | POA: Diagnosis not present

## 2018-05-02 DIAGNOSIS — R739 Hyperglycemia, unspecified: Secondary | ICD-10-CM | POA: Diagnosis not present

## 2018-05-04 DIAGNOSIS — N186 End stage renal disease: Secondary | ICD-10-CM | POA: Diagnosis not present

## 2018-05-04 DIAGNOSIS — R739 Hyperglycemia, unspecified: Secondary | ICD-10-CM | POA: Diagnosis not present

## 2018-05-04 DIAGNOSIS — D631 Anemia in chronic kidney disease: Secondary | ICD-10-CM | POA: Diagnosis not present

## 2018-05-04 DIAGNOSIS — N2581 Secondary hyperparathyroidism of renal origin: Secondary | ICD-10-CM | POA: Diagnosis not present

## 2018-05-06 DIAGNOSIS — D631 Anemia in chronic kidney disease: Secondary | ICD-10-CM | POA: Diagnosis not present

## 2018-05-06 DIAGNOSIS — N2581 Secondary hyperparathyroidism of renal origin: Secondary | ICD-10-CM | POA: Diagnosis not present

## 2018-05-06 DIAGNOSIS — N186 End stage renal disease: Secondary | ICD-10-CM | POA: Diagnosis not present

## 2018-05-06 DIAGNOSIS — R739 Hyperglycemia, unspecified: Secondary | ICD-10-CM | POA: Diagnosis not present

## 2018-05-09 DIAGNOSIS — N186 End stage renal disease: Secondary | ICD-10-CM | POA: Diagnosis not present

## 2018-05-09 DIAGNOSIS — N2581 Secondary hyperparathyroidism of renal origin: Secondary | ICD-10-CM | POA: Diagnosis not present

## 2018-05-09 DIAGNOSIS — R739 Hyperglycemia, unspecified: Secondary | ICD-10-CM | POA: Diagnosis not present

## 2018-05-09 DIAGNOSIS — D631 Anemia in chronic kidney disease: Secondary | ICD-10-CM | POA: Diagnosis not present

## 2018-05-11 DIAGNOSIS — N186 End stage renal disease: Secondary | ICD-10-CM | POA: Diagnosis not present

## 2018-05-11 DIAGNOSIS — R739 Hyperglycemia, unspecified: Secondary | ICD-10-CM | POA: Diagnosis not present

## 2018-05-11 DIAGNOSIS — D631 Anemia in chronic kidney disease: Secondary | ICD-10-CM | POA: Diagnosis not present

## 2018-05-11 DIAGNOSIS — N2581 Secondary hyperparathyroidism of renal origin: Secondary | ICD-10-CM | POA: Diagnosis not present

## 2018-05-13 DIAGNOSIS — N2581 Secondary hyperparathyroidism of renal origin: Secondary | ICD-10-CM | POA: Diagnosis not present

## 2018-05-13 DIAGNOSIS — R739 Hyperglycemia, unspecified: Secondary | ICD-10-CM | POA: Diagnosis not present

## 2018-05-13 DIAGNOSIS — D631 Anemia in chronic kidney disease: Secondary | ICD-10-CM | POA: Diagnosis not present

## 2018-05-13 DIAGNOSIS — N186 End stage renal disease: Secondary | ICD-10-CM | POA: Diagnosis not present

## 2018-05-14 DIAGNOSIS — N186 End stage renal disease: Secondary | ICD-10-CM | POA: Diagnosis not present

## 2018-05-14 DIAGNOSIS — Z992 Dependence on renal dialysis: Secondary | ICD-10-CM | POA: Diagnosis not present

## 2018-05-14 DIAGNOSIS — T8610 Unspecified complication of kidney transplant: Secondary | ICD-10-CM | POA: Diagnosis not present

## 2018-05-16 DIAGNOSIS — N2581 Secondary hyperparathyroidism of renal origin: Secondary | ICD-10-CM | POA: Diagnosis not present

## 2018-05-16 DIAGNOSIS — N186 End stage renal disease: Secondary | ICD-10-CM | POA: Diagnosis not present

## 2018-05-16 DIAGNOSIS — R739 Hyperglycemia, unspecified: Secondary | ICD-10-CM | POA: Diagnosis not present

## 2018-05-16 DIAGNOSIS — D631 Anemia in chronic kidney disease: Secondary | ICD-10-CM | POA: Diagnosis not present

## 2018-05-18 DIAGNOSIS — N186 End stage renal disease: Secondary | ICD-10-CM | POA: Diagnosis not present

## 2018-05-18 DIAGNOSIS — D631 Anemia in chronic kidney disease: Secondary | ICD-10-CM | POA: Diagnosis not present

## 2018-05-18 DIAGNOSIS — N2581 Secondary hyperparathyroidism of renal origin: Secondary | ICD-10-CM | POA: Diagnosis not present

## 2018-05-18 DIAGNOSIS — R739 Hyperglycemia, unspecified: Secondary | ICD-10-CM | POA: Diagnosis not present

## 2018-05-20 DIAGNOSIS — N186 End stage renal disease: Secondary | ICD-10-CM | POA: Diagnosis not present

## 2018-05-20 DIAGNOSIS — D631 Anemia in chronic kidney disease: Secondary | ICD-10-CM | POA: Diagnosis not present

## 2018-05-20 DIAGNOSIS — N2581 Secondary hyperparathyroidism of renal origin: Secondary | ICD-10-CM | POA: Diagnosis not present

## 2018-05-20 DIAGNOSIS — R739 Hyperglycemia, unspecified: Secondary | ICD-10-CM | POA: Diagnosis not present

## 2018-05-23 DIAGNOSIS — D631 Anemia in chronic kidney disease: Secondary | ICD-10-CM | POA: Diagnosis not present

## 2018-05-23 DIAGNOSIS — R739 Hyperglycemia, unspecified: Secondary | ICD-10-CM | POA: Diagnosis not present

## 2018-05-23 DIAGNOSIS — N186 End stage renal disease: Secondary | ICD-10-CM | POA: Diagnosis not present

## 2018-05-23 DIAGNOSIS — N2581 Secondary hyperparathyroidism of renal origin: Secondary | ICD-10-CM | POA: Diagnosis not present

## 2018-05-25 DIAGNOSIS — N186 End stage renal disease: Secondary | ICD-10-CM | POA: Diagnosis not present

## 2018-05-25 DIAGNOSIS — D631 Anemia in chronic kidney disease: Secondary | ICD-10-CM | POA: Diagnosis not present

## 2018-05-25 DIAGNOSIS — R739 Hyperglycemia, unspecified: Secondary | ICD-10-CM | POA: Diagnosis not present

## 2018-05-25 DIAGNOSIS — N2581 Secondary hyperparathyroidism of renal origin: Secondary | ICD-10-CM | POA: Diagnosis not present

## 2018-05-27 DIAGNOSIS — R739 Hyperglycemia, unspecified: Secondary | ICD-10-CM | POA: Diagnosis not present

## 2018-05-27 DIAGNOSIS — D631 Anemia in chronic kidney disease: Secondary | ICD-10-CM | POA: Diagnosis not present

## 2018-05-27 DIAGNOSIS — N186 End stage renal disease: Secondary | ICD-10-CM | POA: Diagnosis not present

## 2018-05-27 DIAGNOSIS — N2581 Secondary hyperparathyroidism of renal origin: Secondary | ICD-10-CM | POA: Diagnosis not present

## 2018-05-30 DIAGNOSIS — D631 Anemia in chronic kidney disease: Secondary | ICD-10-CM | POA: Diagnosis not present

## 2018-05-30 DIAGNOSIS — N2581 Secondary hyperparathyroidism of renal origin: Secondary | ICD-10-CM | POA: Diagnosis not present

## 2018-05-30 DIAGNOSIS — N186 End stage renal disease: Secondary | ICD-10-CM | POA: Diagnosis not present

## 2018-05-30 DIAGNOSIS — R739 Hyperglycemia, unspecified: Secondary | ICD-10-CM | POA: Diagnosis not present

## 2018-06-01 DIAGNOSIS — D631 Anemia in chronic kidney disease: Secondary | ICD-10-CM | POA: Diagnosis not present

## 2018-06-01 DIAGNOSIS — R739 Hyperglycemia, unspecified: Secondary | ICD-10-CM | POA: Diagnosis not present

## 2018-06-01 DIAGNOSIS — N186 End stage renal disease: Secondary | ICD-10-CM | POA: Diagnosis not present

## 2018-06-01 DIAGNOSIS — N2581 Secondary hyperparathyroidism of renal origin: Secondary | ICD-10-CM | POA: Diagnosis not present

## 2018-06-03 DIAGNOSIS — D631 Anemia in chronic kidney disease: Secondary | ICD-10-CM | POA: Diagnosis not present

## 2018-06-03 DIAGNOSIS — N2581 Secondary hyperparathyroidism of renal origin: Secondary | ICD-10-CM | POA: Diagnosis not present

## 2018-06-03 DIAGNOSIS — R739 Hyperglycemia, unspecified: Secondary | ICD-10-CM | POA: Diagnosis not present

## 2018-06-03 DIAGNOSIS — N186 End stage renal disease: Secondary | ICD-10-CM | POA: Diagnosis not present

## 2018-06-06 DIAGNOSIS — N2581 Secondary hyperparathyroidism of renal origin: Secondary | ICD-10-CM | POA: Diagnosis not present

## 2018-06-06 DIAGNOSIS — N186 End stage renal disease: Secondary | ICD-10-CM | POA: Diagnosis not present

## 2018-06-06 DIAGNOSIS — D631 Anemia in chronic kidney disease: Secondary | ICD-10-CM | POA: Diagnosis not present

## 2018-06-06 DIAGNOSIS — R739 Hyperglycemia, unspecified: Secondary | ICD-10-CM | POA: Diagnosis not present

## 2018-06-08 DIAGNOSIS — N186 End stage renal disease: Secondary | ICD-10-CM | POA: Diagnosis not present

## 2018-06-08 DIAGNOSIS — R739 Hyperglycemia, unspecified: Secondary | ICD-10-CM | POA: Diagnosis not present

## 2018-06-08 DIAGNOSIS — N2581 Secondary hyperparathyroidism of renal origin: Secondary | ICD-10-CM | POA: Diagnosis not present

## 2018-06-08 DIAGNOSIS — D631 Anemia in chronic kidney disease: Secondary | ICD-10-CM | POA: Diagnosis not present

## 2018-06-10 DIAGNOSIS — N186 End stage renal disease: Secondary | ICD-10-CM | POA: Diagnosis not present

## 2018-06-10 DIAGNOSIS — R739 Hyperglycemia, unspecified: Secondary | ICD-10-CM | POA: Diagnosis not present

## 2018-06-10 DIAGNOSIS — N2581 Secondary hyperparathyroidism of renal origin: Secondary | ICD-10-CM | POA: Diagnosis not present

## 2018-06-10 DIAGNOSIS — D631 Anemia in chronic kidney disease: Secondary | ICD-10-CM | POA: Diagnosis not present

## 2018-06-13 DIAGNOSIS — R739 Hyperglycemia, unspecified: Secondary | ICD-10-CM | POA: Diagnosis not present

## 2018-06-13 DIAGNOSIS — D631 Anemia in chronic kidney disease: Secondary | ICD-10-CM | POA: Diagnosis not present

## 2018-06-13 DIAGNOSIS — N186 End stage renal disease: Secondary | ICD-10-CM | POA: Diagnosis not present

## 2018-06-13 DIAGNOSIS — Z992 Dependence on renal dialysis: Secondary | ICD-10-CM | POA: Diagnosis not present

## 2018-06-13 DIAGNOSIS — N2581 Secondary hyperparathyroidism of renal origin: Secondary | ICD-10-CM | POA: Diagnosis not present

## 2018-06-13 DIAGNOSIS — T8610 Unspecified complication of kidney transplant: Secondary | ICD-10-CM | POA: Diagnosis not present

## 2018-06-15 DIAGNOSIS — N2581 Secondary hyperparathyroidism of renal origin: Secondary | ICD-10-CM | POA: Diagnosis not present

## 2018-06-15 DIAGNOSIS — N186 End stage renal disease: Secondary | ICD-10-CM | POA: Diagnosis not present

## 2018-06-15 DIAGNOSIS — R739 Hyperglycemia, unspecified: Secondary | ICD-10-CM | POA: Diagnosis not present

## 2018-06-15 DIAGNOSIS — D631 Anemia in chronic kidney disease: Secondary | ICD-10-CM | POA: Diagnosis not present

## 2018-06-15 DIAGNOSIS — Z992 Dependence on renal dialysis: Secondary | ICD-10-CM | POA: Diagnosis not present

## 2018-06-17 DIAGNOSIS — Z992 Dependence on renal dialysis: Secondary | ICD-10-CM | POA: Diagnosis not present

## 2018-06-17 DIAGNOSIS — D631 Anemia in chronic kidney disease: Secondary | ICD-10-CM | POA: Diagnosis not present

## 2018-06-17 DIAGNOSIS — N2581 Secondary hyperparathyroidism of renal origin: Secondary | ICD-10-CM | POA: Diagnosis not present

## 2018-06-17 DIAGNOSIS — N186 End stage renal disease: Secondary | ICD-10-CM | POA: Diagnosis not present

## 2018-06-17 DIAGNOSIS — R739 Hyperglycemia, unspecified: Secondary | ICD-10-CM | POA: Diagnosis not present

## 2018-06-20 DIAGNOSIS — N186 End stage renal disease: Secondary | ICD-10-CM | POA: Diagnosis not present

## 2018-06-20 DIAGNOSIS — D631 Anemia in chronic kidney disease: Secondary | ICD-10-CM | POA: Diagnosis not present

## 2018-06-20 DIAGNOSIS — N2581 Secondary hyperparathyroidism of renal origin: Secondary | ICD-10-CM | POA: Diagnosis not present

## 2018-06-20 DIAGNOSIS — R739 Hyperglycemia, unspecified: Secondary | ICD-10-CM | POA: Diagnosis not present

## 2018-06-20 DIAGNOSIS — Z992 Dependence on renal dialysis: Secondary | ICD-10-CM | POA: Diagnosis not present

## 2018-06-22 DIAGNOSIS — N186 End stage renal disease: Secondary | ICD-10-CM | POA: Diagnosis not present

## 2018-06-22 DIAGNOSIS — D631 Anemia in chronic kidney disease: Secondary | ICD-10-CM | POA: Diagnosis not present

## 2018-06-22 DIAGNOSIS — R739 Hyperglycemia, unspecified: Secondary | ICD-10-CM | POA: Diagnosis not present

## 2018-06-22 DIAGNOSIS — Z992 Dependence on renal dialysis: Secondary | ICD-10-CM | POA: Diagnosis not present

## 2018-06-22 DIAGNOSIS — N2581 Secondary hyperparathyroidism of renal origin: Secondary | ICD-10-CM | POA: Diagnosis not present

## 2018-06-24 DIAGNOSIS — R739 Hyperglycemia, unspecified: Secondary | ICD-10-CM | POA: Diagnosis not present

## 2018-06-24 DIAGNOSIS — N2581 Secondary hyperparathyroidism of renal origin: Secondary | ICD-10-CM | POA: Diagnosis not present

## 2018-06-24 DIAGNOSIS — N186 End stage renal disease: Secondary | ICD-10-CM | POA: Diagnosis not present

## 2018-06-24 DIAGNOSIS — Z992 Dependence on renal dialysis: Secondary | ICD-10-CM | POA: Diagnosis not present

## 2018-06-24 DIAGNOSIS — D631 Anemia in chronic kidney disease: Secondary | ICD-10-CM | POA: Diagnosis not present

## 2018-06-27 DIAGNOSIS — Z992 Dependence on renal dialysis: Secondary | ICD-10-CM | POA: Diagnosis not present

## 2018-06-27 DIAGNOSIS — D631 Anemia in chronic kidney disease: Secondary | ICD-10-CM | POA: Diagnosis not present

## 2018-06-27 DIAGNOSIS — N2581 Secondary hyperparathyroidism of renal origin: Secondary | ICD-10-CM | POA: Diagnosis not present

## 2018-06-27 DIAGNOSIS — N186 End stage renal disease: Secondary | ICD-10-CM | POA: Diagnosis not present

## 2018-06-27 DIAGNOSIS — R739 Hyperglycemia, unspecified: Secondary | ICD-10-CM | POA: Diagnosis not present

## 2018-06-29 DIAGNOSIS — N2581 Secondary hyperparathyroidism of renal origin: Secondary | ICD-10-CM | POA: Diagnosis not present

## 2018-06-29 DIAGNOSIS — N186 End stage renal disease: Secondary | ICD-10-CM | POA: Diagnosis not present

## 2018-06-29 DIAGNOSIS — R739 Hyperglycemia, unspecified: Secondary | ICD-10-CM | POA: Diagnosis not present

## 2018-06-29 DIAGNOSIS — D631 Anemia in chronic kidney disease: Secondary | ICD-10-CM | POA: Diagnosis not present

## 2018-06-29 DIAGNOSIS — Z992 Dependence on renal dialysis: Secondary | ICD-10-CM | POA: Diagnosis not present

## 2018-07-01 DIAGNOSIS — R739 Hyperglycemia, unspecified: Secondary | ICD-10-CM | POA: Diagnosis not present

## 2018-07-01 DIAGNOSIS — N2581 Secondary hyperparathyroidism of renal origin: Secondary | ICD-10-CM | POA: Diagnosis not present

## 2018-07-01 DIAGNOSIS — D631 Anemia in chronic kidney disease: Secondary | ICD-10-CM | POA: Diagnosis not present

## 2018-07-01 DIAGNOSIS — Z992 Dependence on renal dialysis: Secondary | ICD-10-CM | POA: Diagnosis not present

## 2018-07-01 DIAGNOSIS — N186 End stage renal disease: Secondary | ICD-10-CM | POA: Diagnosis not present

## 2018-07-04 DIAGNOSIS — N186 End stage renal disease: Secondary | ICD-10-CM | POA: Diagnosis not present

## 2018-07-06 DIAGNOSIS — N2581 Secondary hyperparathyroidism of renal origin: Secondary | ICD-10-CM | POA: Diagnosis not present

## 2018-07-06 DIAGNOSIS — N186 End stage renal disease: Secondary | ICD-10-CM | POA: Diagnosis not present

## 2018-07-08 DIAGNOSIS — N186 End stage renal disease: Secondary | ICD-10-CM | POA: Diagnosis not present

## 2018-07-08 DIAGNOSIS — N2581 Secondary hyperparathyroidism of renal origin: Secondary | ICD-10-CM | POA: Diagnosis not present

## 2018-07-11 DIAGNOSIS — Z992 Dependence on renal dialysis: Secondary | ICD-10-CM | POA: Diagnosis not present

## 2018-07-11 DIAGNOSIS — N186 End stage renal disease: Secondary | ICD-10-CM | POA: Diagnosis not present

## 2018-07-11 DIAGNOSIS — R739 Hyperglycemia, unspecified: Secondary | ICD-10-CM | POA: Diagnosis not present

## 2018-07-11 DIAGNOSIS — N2581 Secondary hyperparathyroidism of renal origin: Secondary | ICD-10-CM | POA: Diagnosis not present

## 2018-07-11 DIAGNOSIS — D631 Anemia in chronic kidney disease: Secondary | ICD-10-CM | POA: Diagnosis not present

## 2018-07-13 DIAGNOSIS — R739 Hyperglycemia, unspecified: Secondary | ICD-10-CM | POA: Diagnosis not present

## 2018-07-13 DIAGNOSIS — D631 Anemia in chronic kidney disease: Secondary | ICD-10-CM | POA: Diagnosis not present

## 2018-07-13 DIAGNOSIS — N186 End stage renal disease: Secondary | ICD-10-CM | POA: Diagnosis not present

## 2018-07-13 DIAGNOSIS — N2581 Secondary hyperparathyroidism of renal origin: Secondary | ICD-10-CM | POA: Diagnosis not present

## 2018-07-13 DIAGNOSIS — Z992 Dependence on renal dialysis: Secondary | ICD-10-CM | POA: Diagnosis not present

## 2018-07-14 DIAGNOSIS — N186 End stage renal disease: Secondary | ICD-10-CM | POA: Diagnosis not present

## 2018-07-14 DIAGNOSIS — Z992 Dependence on renal dialysis: Secondary | ICD-10-CM | POA: Diagnosis not present

## 2018-07-14 DIAGNOSIS — T8610 Unspecified complication of kidney transplant: Secondary | ICD-10-CM | POA: Diagnosis not present

## 2018-07-15 DIAGNOSIS — D631 Anemia in chronic kidney disease: Secondary | ICD-10-CM | POA: Diagnosis not present

## 2018-07-15 DIAGNOSIS — N186 End stage renal disease: Secondary | ICD-10-CM | POA: Diagnosis not present

## 2018-07-15 DIAGNOSIS — R739 Hyperglycemia, unspecified: Secondary | ICD-10-CM | POA: Diagnosis not present

## 2018-07-15 DIAGNOSIS — N2581 Secondary hyperparathyroidism of renal origin: Secondary | ICD-10-CM | POA: Diagnosis not present

## 2018-07-18 DIAGNOSIS — N186 End stage renal disease: Secondary | ICD-10-CM | POA: Diagnosis not present

## 2018-07-18 DIAGNOSIS — D631 Anemia in chronic kidney disease: Secondary | ICD-10-CM | POA: Diagnosis not present

## 2018-07-18 DIAGNOSIS — N2581 Secondary hyperparathyroidism of renal origin: Secondary | ICD-10-CM | POA: Diagnosis not present

## 2018-07-18 DIAGNOSIS — R739 Hyperglycemia, unspecified: Secondary | ICD-10-CM | POA: Diagnosis not present

## 2018-07-20 DIAGNOSIS — R739 Hyperglycemia, unspecified: Secondary | ICD-10-CM | POA: Diagnosis not present

## 2018-07-20 DIAGNOSIS — N2581 Secondary hyperparathyroidism of renal origin: Secondary | ICD-10-CM | POA: Diagnosis not present

## 2018-07-20 DIAGNOSIS — N186 End stage renal disease: Secondary | ICD-10-CM | POA: Diagnosis not present

## 2018-07-20 DIAGNOSIS — D631 Anemia in chronic kidney disease: Secondary | ICD-10-CM | POA: Diagnosis not present

## 2018-07-22 DIAGNOSIS — R739 Hyperglycemia, unspecified: Secondary | ICD-10-CM | POA: Diagnosis not present

## 2018-07-22 DIAGNOSIS — N186 End stage renal disease: Secondary | ICD-10-CM | POA: Diagnosis not present

## 2018-07-22 DIAGNOSIS — N2581 Secondary hyperparathyroidism of renal origin: Secondary | ICD-10-CM | POA: Diagnosis not present

## 2018-07-22 DIAGNOSIS — D631 Anemia in chronic kidney disease: Secondary | ICD-10-CM | POA: Diagnosis not present

## 2018-07-25 DIAGNOSIS — R739 Hyperglycemia, unspecified: Secondary | ICD-10-CM | POA: Diagnosis not present

## 2018-07-25 DIAGNOSIS — N2581 Secondary hyperparathyroidism of renal origin: Secondary | ICD-10-CM | POA: Diagnosis not present

## 2018-07-25 DIAGNOSIS — D631 Anemia in chronic kidney disease: Secondary | ICD-10-CM | POA: Diagnosis not present

## 2018-07-25 DIAGNOSIS — N186 End stage renal disease: Secondary | ICD-10-CM | POA: Diagnosis not present

## 2018-07-27 DIAGNOSIS — N2581 Secondary hyperparathyroidism of renal origin: Secondary | ICD-10-CM | POA: Diagnosis not present

## 2018-07-27 DIAGNOSIS — N186 End stage renal disease: Secondary | ICD-10-CM | POA: Diagnosis not present

## 2018-07-27 DIAGNOSIS — R739 Hyperglycemia, unspecified: Secondary | ICD-10-CM | POA: Diagnosis not present

## 2018-07-27 DIAGNOSIS — D631 Anemia in chronic kidney disease: Secondary | ICD-10-CM | POA: Diagnosis not present

## 2018-07-29 DIAGNOSIS — N2581 Secondary hyperparathyroidism of renal origin: Secondary | ICD-10-CM | POA: Diagnosis not present

## 2018-07-29 DIAGNOSIS — N186 End stage renal disease: Secondary | ICD-10-CM | POA: Diagnosis not present

## 2018-07-29 DIAGNOSIS — D631 Anemia in chronic kidney disease: Secondary | ICD-10-CM | POA: Diagnosis not present

## 2018-07-29 DIAGNOSIS — R739 Hyperglycemia, unspecified: Secondary | ICD-10-CM | POA: Diagnosis not present

## 2018-08-01 DIAGNOSIS — D631 Anemia in chronic kidney disease: Secondary | ICD-10-CM | POA: Diagnosis not present

## 2018-08-01 DIAGNOSIS — R739 Hyperglycemia, unspecified: Secondary | ICD-10-CM | POA: Diagnosis not present

## 2018-08-01 DIAGNOSIS — N2581 Secondary hyperparathyroidism of renal origin: Secondary | ICD-10-CM | POA: Diagnosis not present

## 2018-08-01 DIAGNOSIS — N186 End stage renal disease: Secondary | ICD-10-CM | POA: Diagnosis not present

## 2018-08-03 DIAGNOSIS — N2581 Secondary hyperparathyroidism of renal origin: Secondary | ICD-10-CM | POA: Diagnosis not present

## 2018-08-03 DIAGNOSIS — D631 Anemia in chronic kidney disease: Secondary | ICD-10-CM | POA: Diagnosis not present

## 2018-08-03 DIAGNOSIS — R739 Hyperglycemia, unspecified: Secondary | ICD-10-CM | POA: Diagnosis not present

## 2018-08-03 DIAGNOSIS — N186 End stage renal disease: Secondary | ICD-10-CM | POA: Diagnosis not present

## 2018-08-05 DIAGNOSIS — N186 End stage renal disease: Secondary | ICD-10-CM | POA: Diagnosis not present

## 2018-08-05 DIAGNOSIS — R739 Hyperglycemia, unspecified: Secondary | ICD-10-CM | POA: Diagnosis not present

## 2018-08-05 DIAGNOSIS — D631 Anemia in chronic kidney disease: Secondary | ICD-10-CM | POA: Diagnosis not present

## 2018-08-05 DIAGNOSIS — N2581 Secondary hyperparathyroidism of renal origin: Secondary | ICD-10-CM | POA: Diagnosis not present

## 2018-08-08 DIAGNOSIS — N186 End stage renal disease: Secondary | ICD-10-CM | POA: Diagnosis not present

## 2018-08-08 DIAGNOSIS — R739 Hyperglycemia, unspecified: Secondary | ICD-10-CM | POA: Diagnosis not present

## 2018-08-08 DIAGNOSIS — D631 Anemia in chronic kidney disease: Secondary | ICD-10-CM | POA: Diagnosis not present

## 2018-08-08 DIAGNOSIS — N2581 Secondary hyperparathyroidism of renal origin: Secondary | ICD-10-CM | POA: Diagnosis not present

## 2018-08-10 DIAGNOSIS — D631 Anemia in chronic kidney disease: Secondary | ICD-10-CM | POA: Diagnosis not present

## 2018-08-10 DIAGNOSIS — N186 End stage renal disease: Secondary | ICD-10-CM | POA: Diagnosis not present

## 2018-08-10 DIAGNOSIS — R739 Hyperglycemia, unspecified: Secondary | ICD-10-CM | POA: Diagnosis not present

## 2018-08-10 DIAGNOSIS — N2581 Secondary hyperparathyroidism of renal origin: Secondary | ICD-10-CM | POA: Diagnosis not present

## 2018-08-12 DIAGNOSIS — R739 Hyperglycemia, unspecified: Secondary | ICD-10-CM | POA: Diagnosis not present

## 2018-08-12 DIAGNOSIS — N186 End stage renal disease: Secondary | ICD-10-CM | POA: Diagnosis not present

## 2018-08-12 DIAGNOSIS — N2581 Secondary hyperparathyroidism of renal origin: Secondary | ICD-10-CM | POA: Diagnosis not present

## 2018-08-12 DIAGNOSIS — D631 Anemia in chronic kidney disease: Secondary | ICD-10-CM | POA: Diagnosis not present

## 2018-08-14 DIAGNOSIS — T8610 Unspecified complication of kidney transplant: Secondary | ICD-10-CM | POA: Diagnosis not present

## 2018-08-14 DIAGNOSIS — Z992 Dependence on renal dialysis: Secondary | ICD-10-CM | POA: Diagnosis not present

## 2018-08-14 DIAGNOSIS — N186 End stage renal disease: Secondary | ICD-10-CM | POA: Diagnosis not present

## 2018-08-15 DIAGNOSIS — N2581 Secondary hyperparathyroidism of renal origin: Secondary | ICD-10-CM | POA: Diagnosis not present

## 2018-08-15 DIAGNOSIS — Z23 Encounter for immunization: Secondary | ICD-10-CM | POA: Diagnosis not present

## 2018-08-15 DIAGNOSIS — N186 End stage renal disease: Secondary | ICD-10-CM | POA: Diagnosis not present

## 2018-08-15 DIAGNOSIS — D631 Anemia in chronic kidney disease: Secondary | ICD-10-CM | POA: Diagnosis not present

## 2018-08-15 DIAGNOSIS — Z992 Dependence on renal dialysis: Secondary | ICD-10-CM | POA: Diagnosis not present

## 2018-08-17 DIAGNOSIS — D631 Anemia in chronic kidney disease: Secondary | ICD-10-CM | POA: Diagnosis not present

## 2018-08-17 DIAGNOSIS — Z992 Dependence on renal dialysis: Secondary | ICD-10-CM | POA: Diagnosis not present

## 2018-08-17 DIAGNOSIS — Z23 Encounter for immunization: Secondary | ICD-10-CM | POA: Diagnosis not present

## 2018-08-17 DIAGNOSIS — N186 End stage renal disease: Secondary | ICD-10-CM | POA: Diagnosis not present

## 2018-08-17 DIAGNOSIS — N2581 Secondary hyperparathyroidism of renal origin: Secondary | ICD-10-CM | POA: Diagnosis not present

## 2018-08-19 DIAGNOSIS — Z23 Encounter for immunization: Secondary | ICD-10-CM | POA: Diagnosis not present

## 2018-08-19 DIAGNOSIS — Z992 Dependence on renal dialysis: Secondary | ICD-10-CM | POA: Diagnosis not present

## 2018-08-19 DIAGNOSIS — N2581 Secondary hyperparathyroidism of renal origin: Secondary | ICD-10-CM | POA: Diagnosis not present

## 2018-08-19 DIAGNOSIS — N186 End stage renal disease: Secondary | ICD-10-CM | POA: Diagnosis not present

## 2018-08-19 DIAGNOSIS — D631 Anemia in chronic kidney disease: Secondary | ICD-10-CM | POA: Diagnosis not present

## 2018-08-22 DIAGNOSIS — N2581 Secondary hyperparathyroidism of renal origin: Secondary | ICD-10-CM | POA: Diagnosis not present

## 2018-08-22 DIAGNOSIS — D631 Anemia in chronic kidney disease: Secondary | ICD-10-CM | POA: Diagnosis not present

## 2018-08-22 DIAGNOSIS — Z23 Encounter for immunization: Secondary | ICD-10-CM | POA: Diagnosis not present

## 2018-08-22 DIAGNOSIS — Z992 Dependence on renal dialysis: Secondary | ICD-10-CM | POA: Diagnosis not present

## 2018-08-22 DIAGNOSIS — N186 End stage renal disease: Secondary | ICD-10-CM | POA: Diagnosis not present

## 2018-08-24 DIAGNOSIS — N186 End stage renal disease: Secondary | ICD-10-CM | POA: Diagnosis not present

## 2018-08-24 DIAGNOSIS — D631 Anemia in chronic kidney disease: Secondary | ICD-10-CM | POA: Diagnosis not present

## 2018-08-24 DIAGNOSIS — Z23 Encounter for immunization: Secondary | ICD-10-CM | POA: Diagnosis not present

## 2018-08-24 DIAGNOSIS — Z992 Dependence on renal dialysis: Secondary | ICD-10-CM | POA: Diagnosis not present

## 2018-08-24 DIAGNOSIS — N2581 Secondary hyperparathyroidism of renal origin: Secondary | ICD-10-CM | POA: Diagnosis not present

## 2018-08-26 DIAGNOSIS — D631 Anemia in chronic kidney disease: Secondary | ICD-10-CM | POA: Diagnosis not present

## 2018-08-26 DIAGNOSIS — Z992 Dependence on renal dialysis: Secondary | ICD-10-CM | POA: Diagnosis not present

## 2018-08-26 DIAGNOSIS — N186 End stage renal disease: Secondary | ICD-10-CM | POA: Diagnosis not present

## 2018-08-26 DIAGNOSIS — Z23 Encounter for immunization: Secondary | ICD-10-CM | POA: Diagnosis not present

## 2018-08-26 DIAGNOSIS — N2581 Secondary hyperparathyroidism of renal origin: Secondary | ICD-10-CM | POA: Diagnosis not present

## 2018-08-29 DIAGNOSIS — Z23 Encounter for immunization: Secondary | ICD-10-CM | POA: Diagnosis not present

## 2018-08-29 DIAGNOSIS — N186 End stage renal disease: Secondary | ICD-10-CM | POA: Diagnosis not present

## 2018-08-29 DIAGNOSIS — Z992 Dependence on renal dialysis: Secondary | ICD-10-CM | POA: Diagnosis not present

## 2018-08-29 DIAGNOSIS — N2581 Secondary hyperparathyroidism of renal origin: Secondary | ICD-10-CM | POA: Diagnosis not present

## 2018-08-29 DIAGNOSIS — D631 Anemia in chronic kidney disease: Secondary | ICD-10-CM | POA: Diagnosis not present

## 2018-08-31 DIAGNOSIS — Z23 Encounter for immunization: Secondary | ICD-10-CM | POA: Diagnosis not present

## 2018-08-31 DIAGNOSIS — D631 Anemia in chronic kidney disease: Secondary | ICD-10-CM | POA: Diagnosis not present

## 2018-08-31 DIAGNOSIS — N2581 Secondary hyperparathyroidism of renal origin: Secondary | ICD-10-CM | POA: Diagnosis not present

## 2018-08-31 DIAGNOSIS — Z992 Dependence on renal dialysis: Secondary | ICD-10-CM | POA: Diagnosis not present

## 2018-08-31 DIAGNOSIS — N186 End stage renal disease: Secondary | ICD-10-CM | POA: Diagnosis not present

## 2018-09-02 DIAGNOSIS — N186 End stage renal disease: Secondary | ICD-10-CM | POA: Diagnosis not present

## 2018-09-02 DIAGNOSIS — Z992 Dependence on renal dialysis: Secondary | ICD-10-CM | POA: Diagnosis not present

## 2018-09-02 DIAGNOSIS — Z23 Encounter for immunization: Secondary | ICD-10-CM | POA: Diagnosis not present

## 2018-09-02 DIAGNOSIS — D631 Anemia in chronic kidney disease: Secondary | ICD-10-CM | POA: Diagnosis not present

## 2018-09-02 DIAGNOSIS — N2581 Secondary hyperparathyroidism of renal origin: Secondary | ICD-10-CM | POA: Diagnosis not present

## 2018-09-05 DIAGNOSIS — D631 Anemia in chronic kidney disease: Secondary | ICD-10-CM | POA: Diagnosis not present

## 2018-09-05 DIAGNOSIS — N186 End stage renal disease: Secondary | ICD-10-CM | POA: Diagnosis not present

## 2018-09-05 DIAGNOSIS — Z992 Dependence on renal dialysis: Secondary | ICD-10-CM | POA: Diagnosis not present

## 2018-09-05 DIAGNOSIS — Z23 Encounter for immunization: Secondary | ICD-10-CM | POA: Diagnosis not present

## 2018-09-05 DIAGNOSIS — N2581 Secondary hyperparathyroidism of renal origin: Secondary | ICD-10-CM | POA: Diagnosis not present

## 2018-09-07 DIAGNOSIS — N2581 Secondary hyperparathyroidism of renal origin: Secondary | ICD-10-CM | POA: Diagnosis not present

## 2018-09-07 DIAGNOSIS — Z23 Encounter for immunization: Secondary | ICD-10-CM | POA: Diagnosis not present

## 2018-09-07 DIAGNOSIS — D631 Anemia in chronic kidney disease: Secondary | ICD-10-CM | POA: Diagnosis not present

## 2018-09-07 DIAGNOSIS — N186 End stage renal disease: Secondary | ICD-10-CM | POA: Diagnosis not present

## 2018-09-07 DIAGNOSIS — Z992 Dependence on renal dialysis: Secondary | ICD-10-CM | POA: Diagnosis not present

## 2018-09-09 DIAGNOSIS — N186 End stage renal disease: Secondary | ICD-10-CM | POA: Diagnosis not present

## 2018-09-09 DIAGNOSIS — Z23 Encounter for immunization: Secondary | ICD-10-CM | POA: Diagnosis not present

## 2018-09-09 DIAGNOSIS — N2581 Secondary hyperparathyroidism of renal origin: Secondary | ICD-10-CM | POA: Diagnosis not present

## 2018-09-09 DIAGNOSIS — Z992 Dependence on renal dialysis: Secondary | ICD-10-CM | POA: Diagnosis not present

## 2018-09-09 DIAGNOSIS — D631 Anemia in chronic kidney disease: Secondary | ICD-10-CM | POA: Diagnosis not present

## 2018-09-12 DIAGNOSIS — N2581 Secondary hyperparathyroidism of renal origin: Secondary | ICD-10-CM | POA: Diagnosis not present

## 2018-09-12 DIAGNOSIS — D631 Anemia in chronic kidney disease: Secondary | ICD-10-CM | POA: Diagnosis not present

## 2018-09-12 DIAGNOSIS — N186 End stage renal disease: Secondary | ICD-10-CM | POA: Diagnosis not present

## 2018-09-12 DIAGNOSIS — Z23 Encounter for immunization: Secondary | ICD-10-CM | POA: Diagnosis not present

## 2018-09-12 DIAGNOSIS — Z992 Dependence on renal dialysis: Secondary | ICD-10-CM | POA: Diagnosis not present

## 2018-09-13 DIAGNOSIS — N186 End stage renal disease: Secondary | ICD-10-CM | POA: Diagnosis not present

## 2018-09-13 DIAGNOSIS — T8610 Unspecified complication of kidney transplant: Secondary | ICD-10-CM | POA: Diagnosis not present

## 2018-09-13 DIAGNOSIS — Z992 Dependence on renal dialysis: Secondary | ICD-10-CM | POA: Diagnosis not present

## 2018-09-14 DIAGNOSIS — D631 Anemia in chronic kidney disease: Secondary | ICD-10-CM | POA: Diagnosis not present

## 2018-09-14 DIAGNOSIS — N186 End stage renal disease: Secondary | ICD-10-CM | POA: Diagnosis not present

## 2018-09-14 DIAGNOSIS — N2581 Secondary hyperparathyroidism of renal origin: Secondary | ICD-10-CM | POA: Diagnosis not present

## 2018-09-14 DIAGNOSIS — D509 Iron deficiency anemia, unspecified: Secondary | ICD-10-CM | POA: Diagnosis not present

## 2018-09-14 DIAGNOSIS — R739 Hyperglycemia, unspecified: Secondary | ICD-10-CM | POA: Diagnosis not present

## 2018-09-14 DIAGNOSIS — Z23 Encounter for immunization: Secondary | ICD-10-CM | POA: Diagnosis not present

## 2018-09-16 DIAGNOSIS — Z23 Encounter for immunization: Secondary | ICD-10-CM | POA: Diagnosis not present

## 2018-09-16 DIAGNOSIS — N2581 Secondary hyperparathyroidism of renal origin: Secondary | ICD-10-CM | POA: Diagnosis not present

## 2018-09-16 DIAGNOSIS — D509 Iron deficiency anemia, unspecified: Secondary | ICD-10-CM | POA: Diagnosis not present

## 2018-09-16 DIAGNOSIS — R739 Hyperglycemia, unspecified: Secondary | ICD-10-CM | POA: Diagnosis not present

## 2018-09-16 DIAGNOSIS — N186 End stage renal disease: Secondary | ICD-10-CM | POA: Diagnosis not present

## 2018-09-16 DIAGNOSIS — D631 Anemia in chronic kidney disease: Secondary | ICD-10-CM | POA: Diagnosis not present

## 2018-09-19 DIAGNOSIS — Z23 Encounter for immunization: Secondary | ICD-10-CM | POA: Diagnosis not present

## 2018-09-19 DIAGNOSIS — R739 Hyperglycemia, unspecified: Secondary | ICD-10-CM | POA: Diagnosis not present

## 2018-09-19 DIAGNOSIS — N2581 Secondary hyperparathyroidism of renal origin: Secondary | ICD-10-CM | POA: Diagnosis not present

## 2018-09-19 DIAGNOSIS — D509 Iron deficiency anemia, unspecified: Secondary | ICD-10-CM | POA: Diagnosis not present

## 2018-09-19 DIAGNOSIS — D631 Anemia in chronic kidney disease: Secondary | ICD-10-CM | POA: Diagnosis not present

## 2018-09-19 DIAGNOSIS — N186 End stage renal disease: Secondary | ICD-10-CM | POA: Diagnosis not present

## 2018-09-21 DIAGNOSIS — N2581 Secondary hyperparathyroidism of renal origin: Secondary | ICD-10-CM | POA: Diagnosis not present

## 2018-09-21 DIAGNOSIS — Z23 Encounter for immunization: Secondary | ICD-10-CM | POA: Diagnosis not present

## 2018-09-21 DIAGNOSIS — N186 End stage renal disease: Secondary | ICD-10-CM | POA: Diagnosis not present

## 2018-09-21 DIAGNOSIS — D509 Iron deficiency anemia, unspecified: Secondary | ICD-10-CM | POA: Diagnosis not present

## 2018-09-21 DIAGNOSIS — R739 Hyperglycemia, unspecified: Secondary | ICD-10-CM | POA: Diagnosis not present

## 2018-09-21 DIAGNOSIS — D631 Anemia in chronic kidney disease: Secondary | ICD-10-CM | POA: Diagnosis not present

## 2018-09-23 DIAGNOSIS — N186 End stage renal disease: Secondary | ICD-10-CM | POA: Diagnosis not present

## 2018-09-23 DIAGNOSIS — R739 Hyperglycemia, unspecified: Secondary | ICD-10-CM | POA: Diagnosis not present

## 2018-09-23 DIAGNOSIS — D509 Iron deficiency anemia, unspecified: Secondary | ICD-10-CM | POA: Diagnosis not present

## 2018-09-23 DIAGNOSIS — D631 Anemia in chronic kidney disease: Secondary | ICD-10-CM | POA: Diagnosis not present

## 2018-09-23 DIAGNOSIS — N2581 Secondary hyperparathyroidism of renal origin: Secondary | ICD-10-CM | POA: Diagnosis not present

## 2018-09-23 DIAGNOSIS — Z23 Encounter for immunization: Secondary | ICD-10-CM | POA: Diagnosis not present

## 2018-09-26 DIAGNOSIS — D509 Iron deficiency anemia, unspecified: Secondary | ICD-10-CM | POA: Diagnosis not present

## 2018-09-26 DIAGNOSIS — R739 Hyperglycemia, unspecified: Secondary | ICD-10-CM | POA: Diagnosis not present

## 2018-09-26 DIAGNOSIS — Z23 Encounter for immunization: Secondary | ICD-10-CM | POA: Diagnosis not present

## 2018-09-26 DIAGNOSIS — D631 Anemia in chronic kidney disease: Secondary | ICD-10-CM | POA: Diagnosis not present

## 2018-09-26 DIAGNOSIS — N186 End stage renal disease: Secondary | ICD-10-CM | POA: Diagnosis not present

## 2018-09-26 DIAGNOSIS — N2581 Secondary hyperparathyroidism of renal origin: Secondary | ICD-10-CM | POA: Diagnosis not present

## 2018-09-28 DIAGNOSIS — Z23 Encounter for immunization: Secondary | ICD-10-CM | POA: Diagnosis not present

## 2018-09-28 DIAGNOSIS — D631 Anemia in chronic kidney disease: Secondary | ICD-10-CM | POA: Diagnosis not present

## 2018-09-28 DIAGNOSIS — R739 Hyperglycemia, unspecified: Secondary | ICD-10-CM | POA: Diagnosis not present

## 2018-09-28 DIAGNOSIS — D509 Iron deficiency anemia, unspecified: Secondary | ICD-10-CM | POA: Diagnosis not present

## 2018-09-28 DIAGNOSIS — N2581 Secondary hyperparathyroidism of renal origin: Secondary | ICD-10-CM | POA: Diagnosis not present

## 2018-09-28 DIAGNOSIS — N186 End stage renal disease: Secondary | ICD-10-CM | POA: Diagnosis not present

## 2018-09-30 DIAGNOSIS — D631 Anemia in chronic kidney disease: Secondary | ICD-10-CM | POA: Diagnosis not present

## 2018-09-30 DIAGNOSIS — Z23 Encounter for immunization: Secondary | ICD-10-CM | POA: Diagnosis not present

## 2018-09-30 DIAGNOSIS — N2581 Secondary hyperparathyroidism of renal origin: Secondary | ICD-10-CM | POA: Diagnosis not present

## 2018-09-30 DIAGNOSIS — N186 End stage renal disease: Secondary | ICD-10-CM | POA: Diagnosis not present

## 2018-09-30 DIAGNOSIS — D509 Iron deficiency anemia, unspecified: Secondary | ICD-10-CM | POA: Diagnosis not present

## 2018-09-30 DIAGNOSIS — R739 Hyperglycemia, unspecified: Secondary | ICD-10-CM | POA: Diagnosis not present

## 2018-10-03 DIAGNOSIS — Z23 Encounter for immunization: Secondary | ICD-10-CM | POA: Diagnosis not present

## 2018-10-03 DIAGNOSIS — N2581 Secondary hyperparathyroidism of renal origin: Secondary | ICD-10-CM | POA: Diagnosis not present

## 2018-10-03 DIAGNOSIS — N186 End stage renal disease: Secondary | ICD-10-CM | POA: Diagnosis not present

## 2018-10-03 DIAGNOSIS — D631 Anemia in chronic kidney disease: Secondary | ICD-10-CM | POA: Diagnosis not present

## 2018-10-03 DIAGNOSIS — D509 Iron deficiency anemia, unspecified: Secondary | ICD-10-CM | POA: Diagnosis not present

## 2018-10-03 DIAGNOSIS — R739 Hyperglycemia, unspecified: Secondary | ICD-10-CM | POA: Diagnosis not present

## 2018-10-05 DIAGNOSIS — D509 Iron deficiency anemia, unspecified: Secondary | ICD-10-CM | POA: Diagnosis not present

## 2018-10-05 DIAGNOSIS — Z23 Encounter for immunization: Secondary | ICD-10-CM | POA: Diagnosis not present

## 2018-10-05 DIAGNOSIS — R739 Hyperglycemia, unspecified: Secondary | ICD-10-CM | POA: Diagnosis not present

## 2018-10-05 DIAGNOSIS — N2581 Secondary hyperparathyroidism of renal origin: Secondary | ICD-10-CM | POA: Diagnosis not present

## 2018-10-05 DIAGNOSIS — D631 Anemia in chronic kidney disease: Secondary | ICD-10-CM | POA: Diagnosis not present

## 2018-10-05 DIAGNOSIS — N186 End stage renal disease: Secondary | ICD-10-CM | POA: Diagnosis not present

## 2018-10-07 DIAGNOSIS — N2581 Secondary hyperparathyroidism of renal origin: Secondary | ICD-10-CM | POA: Diagnosis not present

## 2018-10-07 DIAGNOSIS — D509 Iron deficiency anemia, unspecified: Secondary | ICD-10-CM | POA: Diagnosis not present

## 2018-10-07 DIAGNOSIS — Z23 Encounter for immunization: Secondary | ICD-10-CM | POA: Diagnosis not present

## 2018-10-07 DIAGNOSIS — D631 Anemia in chronic kidney disease: Secondary | ICD-10-CM | POA: Diagnosis not present

## 2018-10-07 DIAGNOSIS — N186 End stage renal disease: Secondary | ICD-10-CM | POA: Diagnosis not present

## 2018-10-07 DIAGNOSIS — R739 Hyperglycemia, unspecified: Secondary | ICD-10-CM | POA: Diagnosis not present

## 2018-10-10 DIAGNOSIS — D509 Iron deficiency anemia, unspecified: Secondary | ICD-10-CM | POA: Diagnosis not present

## 2018-10-10 DIAGNOSIS — N186 End stage renal disease: Secondary | ICD-10-CM | POA: Diagnosis not present

## 2018-10-10 DIAGNOSIS — N2581 Secondary hyperparathyroidism of renal origin: Secondary | ICD-10-CM | POA: Diagnosis not present

## 2018-10-10 DIAGNOSIS — D631 Anemia in chronic kidney disease: Secondary | ICD-10-CM | POA: Diagnosis not present

## 2018-10-10 DIAGNOSIS — R739 Hyperglycemia, unspecified: Secondary | ICD-10-CM | POA: Diagnosis not present

## 2018-10-10 DIAGNOSIS — Z23 Encounter for immunization: Secondary | ICD-10-CM | POA: Diagnosis not present

## 2018-10-12 DIAGNOSIS — D631 Anemia in chronic kidney disease: Secondary | ICD-10-CM | POA: Diagnosis not present

## 2018-10-12 DIAGNOSIS — D509 Iron deficiency anemia, unspecified: Secondary | ICD-10-CM | POA: Diagnosis not present

## 2018-10-12 DIAGNOSIS — N2581 Secondary hyperparathyroidism of renal origin: Secondary | ICD-10-CM | POA: Diagnosis not present

## 2018-10-12 DIAGNOSIS — Z23 Encounter for immunization: Secondary | ICD-10-CM | POA: Diagnosis not present

## 2018-10-12 DIAGNOSIS — R739 Hyperglycemia, unspecified: Secondary | ICD-10-CM | POA: Diagnosis not present

## 2018-10-12 DIAGNOSIS — N186 End stage renal disease: Secondary | ICD-10-CM | POA: Diagnosis not present

## 2018-10-14 DIAGNOSIS — D631 Anemia in chronic kidney disease: Secondary | ICD-10-CM | POA: Diagnosis not present

## 2018-10-14 DIAGNOSIS — N186 End stage renal disease: Secondary | ICD-10-CM | POA: Diagnosis not present

## 2018-10-14 DIAGNOSIS — T8610 Unspecified complication of kidney transplant: Secondary | ICD-10-CM | POA: Diagnosis not present

## 2018-10-14 DIAGNOSIS — N2581 Secondary hyperparathyroidism of renal origin: Secondary | ICD-10-CM | POA: Diagnosis not present

## 2018-10-14 DIAGNOSIS — Z992 Dependence on renal dialysis: Secondary | ICD-10-CM | POA: Diagnosis not present

## 2018-10-14 DIAGNOSIS — R739 Hyperglycemia, unspecified: Secondary | ICD-10-CM | POA: Diagnosis not present

## 2018-10-17 DIAGNOSIS — R739 Hyperglycemia, unspecified: Secondary | ICD-10-CM | POA: Diagnosis not present

## 2018-10-17 DIAGNOSIS — D631 Anemia in chronic kidney disease: Secondary | ICD-10-CM | POA: Diagnosis not present

## 2018-10-17 DIAGNOSIS — N2581 Secondary hyperparathyroidism of renal origin: Secondary | ICD-10-CM | POA: Diagnosis not present

## 2018-10-17 DIAGNOSIS — N186 End stage renal disease: Secondary | ICD-10-CM | POA: Diagnosis not present

## 2018-10-19 DIAGNOSIS — D631 Anemia in chronic kidney disease: Secondary | ICD-10-CM | POA: Diagnosis not present

## 2018-10-19 DIAGNOSIS — R739 Hyperglycemia, unspecified: Secondary | ICD-10-CM | POA: Diagnosis not present

## 2018-10-19 DIAGNOSIS — N186 End stage renal disease: Secondary | ICD-10-CM | POA: Diagnosis not present

## 2018-10-19 DIAGNOSIS — N2581 Secondary hyperparathyroidism of renal origin: Secondary | ICD-10-CM | POA: Diagnosis not present

## 2018-10-21 DIAGNOSIS — R739 Hyperglycemia, unspecified: Secondary | ICD-10-CM | POA: Diagnosis not present

## 2018-10-21 DIAGNOSIS — N186 End stage renal disease: Secondary | ICD-10-CM | POA: Diagnosis not present

## 2018-10-21 DIAGNOSIS — D631 Anemia in chronic kidney disease: Secondary | ICD-10-CM | POA: Diagnosis not present

## 2018-10-21 DIAGNOSIS — N2581 Secondary hyperparathyroidism of renal origin: Secondary | ICD-10-CM | POA: Diagnosis not present

## 2018-10-24 DIAGNOSIS — N186 End stage renal disease: Secondary | ICD-10-CM | POA: Diagnosis not present

## 2018-10-24 DIAGNOSIS — R739 Hyperglycemia, unspecified: Secondary | ICD-10-CM | POA: Diagnosis not present

## 2018-10-24 DIAGNOSIS — N2581 Secondary hyperparathyroidism of renal origin: Secondary | ICD-10-CM | POA: Diagnosis not present

## 2018-10-24 DIAGNOSIS — D631 Anemia in chronic kidney disease: Secondary | ICD-10-CM | POA: Diagnosis not present

## 2018-10-26 DIAGNOSIS — N2581 Secondary hyperparathyroidism of renal origin: Secondary | ICD-10-CM | POA: Diagnosis not present

## 2018-10-26 DIAGNOSIS — N186 End stage renal disease: Secondary | ICD-10-CM | POA: Diagnosis not present

## 2018-10-26 DIAGNOSIS — R739 Hyperglycemia, unspecified: Secondary | ICD-10-CM | POA: Diagnosis not present

## 2018-10-26 DIAGNOSIS — D631 Anemia in chronic kidney disease: Secondary | ICD-10-CM | POA: Diagnosis not present

## 2018-10-28 DIAGNOSIS — D631 Anemia in chronic kidney disease: Secondary | ICD-10-CM | POA: Diagnosis not present

## 2018-10-28 DIAGNOSIS — N186 End stage renal disease: Secondary | ICD-10-CM | POA: Diagnosis not present

## 2018-10-28 DIAGNOSIS — R739 Hyperglycemia, unspecified: Secondary | ICD-10-CM | POA: Diagnosis not present

## 2018-10-28 DIAGNOSIS — N2581 Secondary hyperparathyroidism of renal origin: Secondary | ICD-10-CM | POA: Diagnosis not present

## 2018-10-31 DIAGNOSIS — D631 Anemia in chronic kidney disease: Secondary | ICD-10-CM | POA: Diagnosis not present

## 2018-10-31 DIAGNOSIS — R739 Hyperglycemia, unspecified: Secondary | ICD-10-CM | POA: Diagnosis not present

## 2018-10-31 DIAGNOSIS — N186 End stage renal disease: Secondary | ICD-10-CM | POA: Diagnosis not present

## 2018-10-31 DIAGNOSIS — N2581 Secondary hyperparathyroidism of renal origin: Secondary | ICD-10-CM | POA: Diagnosis not present

## 2018-11-02 DIAGNOSIS — D631 Anemia in chronic kidney disease: Secondary | ICD-10-CM | POA: Diagnosis not present

## 2018-11-02 DIAGNOSIS — N186 End stage renal disease: Secondary | ICD-10-CM | POA: Diagnosis not present

## 2018-11-02 DIAGNOSIS — R739 Hyperglycemia, unspecified: Secondary | ICD-10-CM | POA: Diagnosis not present

## 2018-11-02 DIAGNOSIS — N2581 Secondary hyperparathyroidism of renal origin: Secondary | ICD-10-CM | POA: Diagnosis not present

## 2018-11-04 DIAGNOSIS — R739 Hyperglycemia, unspecified: Secondary | ICD-10-CM | POA: Diagnosis not present

## 2018-11-04 DIAGNOSIS — N2581 Secondary hyperparathyroidism of renal origin: Secondary | ICD-10-CM | POA: Diagnosis not present

## 2018-11-04 DIAGNOSIS — D631 Anemia in chronic kidney disease: Secondary | ICD-10-CM | POA: Diagnosis not present

## 2018-11-04 DIAGNOSIS — N186 End stage renal disease: Secondary | ICD-10-CM | POA: Diagnosis not present

## 2018-11-06 DIAGNOSIS — R739 Hyperglycemia, unspecified: Secondary | ICD-10-CM | POA: Diagnosis not present

## 2018-11-06 DIAGNOSIS — N2581 Secondary hyperparathyroidism of renal origin: Secondary | ICD-10-CM | POA: Diagnosis not present

## 2018-11-06 DIAGNOSIS — D631 Anemia in chronic kidney disease: Secondary | ICD-10-CM | POA: Diagnosis not present

## 2018-11-06 DIAGNOSIS — N186 End stage renal disease: Secondary | ICD-10-CM | POA: Diagnosis not present

## 2018-11-08 DIAGNOSIS — N186 End stage renal disease: Secondary | ICD-10-CM | POA: Diagnosis not present

## 2018-11-08 DIAGNOSIS — R739 Hyperglycemia, unspecified: Secondary | ICD-10-CM | POA: Diagnosis not present

## 2018-11-08 DIAGNOSIS — N2581 Secondary hyperparathyroidism of renal origin: Secondary | ICD-10-CM | POA: Diagnosis not present

## 2018-11-08 DIAGNOSIS — D631 Anemia in chronic kidney disease: Secondary | ICD-10-CM | POA: Diagnosis not present

## 2018-11-11 DIAGNOSIS — R739 Hyperglycemia, unspecified: Secondary | ICD-10-CM | POA: Diagnosis not present

## 2018-11-11 DIAGNOSIS — N2581 Secondary hyperparathyroidism of renal origin: Secondary | ICD-10-CM | POA: Diagnosis not present

## 2018-11-11 DIAGNOSIS — N186 End stage renal disease: Secondary | ICD-10-CM | POA: Diagnosis not present

## 2018-11-11 DIAGNOSIS — D631 Anemia in chronic kidney disease: Secondary | ICD-10-CM | POA: Diagnosis not present

## 2018-11-13 DIAGNOSIS — N186 End stage renal disease: Secondary | ICD-10-CM | POA: Diagnosis not present

## 2018-11-13 DIAGNOSIS — Z992 Dependence on renal dialysis: Secondary | ICD-10-CM | POA: Diagnosis not present

## 2018-11-13 DIAGNOSIS — T8610 Unspecified complication of kidney transplant: Secondary | ICD-10-CM | POA: Diagnosis not present

## 2018-11-14 DIAGNOSIS — D631 Anemia in chronic kidney disease: Secondary | ICD-10-CM | POA: Diagnosis not present

## 2018-11-14 DIAGNOSIS — R739 Hyperglycemia, unspecified: Secondary | ICD-10-CM | POA: Diagnosis not present

## 2018-11-14 DIAGNOSIS — N2581 Secondary hyperparathyroidism of renal origin: Secondary | ICD-10-CM | POA: Diagnosis not present

## 2018-11-14 DIAGNOSIS — N186 End stage renal disease: Secondary | ICD-10-CM | POA: Diagnosis not present

## 2018-11-16 DIAGNOSIS — D631 Anemia in chronic kidney disease: Secondary | ICD-10-CM | POA: Diagnosis not present

## 2018-11-16 DIAGNOSIS — N186 End stage renal disease: Secondary | ICD-10-CM | POA: Diagnosis not present

## 2018-11-16 DIAGNOSIS — N2581 Secondary hyperparathyroidism of renal origin: Secondary | ICD-10-CM | POA: Diagnosis not present

## 2018-11-16 DIAGNOSIS — R739 Hyperglycemia, unspecified: Secondary | ICD-10-CM | POA: Diagnosis not present

## 2018-11-18 DIAGNOSIS — D631 Anemia in chronic kidney disease: Secondary | ICD-10-CM | POA: Diagnosis not present

## 2018-11-18 DIAGNOSIS — R739 Hyperglycemia, unspecified: Secondary | ICD-10-CM | POA: Diagnosis not present

## 2018-11-18 DIAGNOSIS — N2581 Secondary hyperparathyroidism of renal origin: Secondary | ICD-10-CM | POA: Diagnosis not present

## 2018-11-18 DIAGNOSIS — N186 End stage renal disease: Secondary | ICD-10-CM | POA: Diagnosis not present

## 2018-11-21 DIAGNOSIS — N186 End stage renal disease: Secondary | ICD-10-CM | POA: Diagnosis not present

## 2018-11-21 DIAGNOSIS — D631 Anemia in chronic kidney disease: Secondary | ICD-10-CM | POA: Diagnosis not present

## 2018-11-21 DIAGNOSIS — R739 Hyperglycemia, unspecified: Secondary | ICD-10-CM | POA: Diagnosis not present

## 2018-11-21 DIAGNOSIS — N2581 Secondary hyperparathyroidism of renal origin: Secondary | ICD-10-CM | POA: Diagnosis not present

## 2018-11-23 DIAGNOSIS — D631 Anemia in chronic kidney disease: Secondary | ICD-10-CM | POA: Diagnosis not present

## 2018-11-23 DIAGNOSIS — N186 End stage renal disease: Secondary | ICD-10-CM | POA: Diagnosis not present

## 2018-11-23 DIAGNOSIS — N2581 Secondary hyperparathyroidism of renal origin: Secondary | ICD-10-CM | POA: Diagnosis not present

## 2018-11-23 DIAGNOSIS — R739 Hyperglycemia, unspecified: Secondary | ICD-10-CM | POA: Diagnosis not present

## 2018-11-24 DIAGNOSIS — N186 End stage renal disease: Secondary | ICD-10-CM | POA: Diagnosis not present

## 2018-11-24 DIAGNOSIS — R739 Hyperglycemia, unspecified: Secondary | ICD-10-CM | POA: Diagnosis not present

## 2018-11-24 DIAGNOSIS — N2581 Secondary hyperparathyroidism of renal origin: Secondary | ICD-10-CM | POA: Diagnosis not present

## 2018-11-24 DIAGNOSIS — D631 Anemia in chronic kidney disease: Secondary | ICD-10-CM | POA: Diagnosis not present

## 2018-11-28 DIAGNOSIS — R739 Hyperglycemia, unspecified: Secondary | ICD-10-CM | POA: Diagnosis not present

## 2018-11-28 DIAGNOSIS — D631 Anemia in chronic kidney disease: Secondary | ICD-10-CM | POA: Diagnosis not present

## 2018-11-28 DIAGNOSIS — N2581 Secondary hyperparathyroidism of renal origin: Secondary | ICD-10-CM | POA: Diagnosis not present

## 2018-11-28 DIAGNOSIS — N186 End stage renal disease: Secondary | ICD-10-CM | POA: Diagnosis not present

## 2018-11-30 DIAGNOSIS — D631 Anemia in chronic kidney disease: Secondary | ICD-10-CM | POA: Diagnosis not present

## 2018-11-30 DIAGNOSIS — R739 Hyperglycemia, unspecified: Secondary | ICD-10-CM | POA: Diagnosis not present

## 2018-11-30 DIAGNOSIS — N2581 Secondary hyperparathyroidism of renal origin: Secondary | ICD-10-CM | POA: Diagnosis not present

## 2018-11-30 DIAGNOSIS — N186 End stage renal disease: Secondary | ICD-10-CM | POA: Diagnosis not present

## 2018-12-02 DIAGNOSIS — N2581 Secondary hyperparathyroidism of renal origin: Secondary | ICD-10-CM | POA: Diagnosis not present

## 2018-12-02 DIAGNOSIS — D631 Anemia in chronic kidney disease: Secondary | ICD-10-CM | POA: Diagnosis not present

## 2018-12-02 DIAGNOSIS — N186 End stage renal disease: Secondary | ICD-10-CM | POA: Diagnosis not present

## 2018-12-02 DIAGNOSIS — R739 Hyperglycemia, unspecified: Secondary | ICD-10-CM | POA: Diagnosis not present

## 2018-12-04 DIAGNOSIS — R739 Hyperglycemia, unspecified: Secondary | ICD-10-CM | POA: Diagnosis not present

## 2018-12-04 DIAGNOSIS — D631 Anemia in chronic kidney disease: Secondary | ICD-10-CM | POA: Diagnosis not present

## 2018-12-04 DIAGNOSIS — N186 End stage renal disease: Secondary | ICD-10-CM | POA: Diagnosis not present

## 2018-12-04 DIAGNOSIS — N2581 Secondary hyperparathyroidism of renal origin: Secondary | ICD-10-CM | POA: Diagnosis not present

## 2018-12-06 DIAGNOSIS — D631 Anemia in chronic kidney disease: Secondary | ICD-10-CM | POA: Diagnosis not present

## 2018-12-06 DIAGNOSIS — N186 End stage renal disease: Secondary | ICD-10-CM | POA: Diagnosis not present

## 2018-12-06 DIAGNOSIS — N2581 Secondary hyperparathyroidism of renal origin: Secondary | ICD-10-CM | POA: Diagnosis not present

## 2018-12-06 DIAGNOSIS — R739 Hyperglycemia, unspecified: Secondary | ICD-10-CM | POA: Diagnosis not present

## 2018-12-09 DIAGNOSIS — R739 Hyperglycemia, unspecified: Secondary | ICD-10-CM | POA: Diagnosis not present

## 2018-12-09 DIAGNOSIS — N186 End stage renal disease: Secondary | ICD-10-CM | POA: Diagnosis not present

## 2018-12-09 DIAGNOSIS — D631 Anemia in chronic kidney disease: Secondary | ICD-10-CM | POA: Diagnosis not present

## 2018-12-09 DIAGNOSIS — N2581 Secondary hyperparathyroidism of renal origin: Secondary | ICD-10-CM | POA: Diagnosis not present

## 2018-12-11 DIAGNOSIS — N186 End stage renal disease: Secondary | ICD-10-CM | POA: Diagnosis not present

## 2018-12-11 DIAGNOSIS — N2581 Secondary hyperparathyroidism of renal origin: Secondary | ICD-10-CM | POA: Diagnosis not present

## 2018-12-11 DIAGNOSIS — D631 Anemia in chronic kidney disease: Secondary | ICD-10-CM | POA: Diagnosis not present

## 2018-12-11 DIAGNOSIS — R739 Hyperglycemia, unspecified: Secondary | ICD-10-CM | POA: Diagnosis not present

## 2018-12-13 DIAGNOSIS — N186 End stage renal disease: Secondary | ICD-10-CM | POA: Diagnosis not present

## 2018-12-13 DIAGNOSIS — N2581 Secondary hyperparathyroidism of renal origin: Secondary | ICD-10-CM | POA: Diagnosis not present

## 2018-12-13 DIAGNOSIS — R739 Hyperglycemia, unspecified: Secondary | ICD-10-CM | POA: Diagnosis not present

## 2018-12-13 DIAGNOSIS — D631 Anemia in chronic kidney disease: Secondary | ICD-10-CM | POA: Diagnosis not present

## 2018-12-14 DIAGNOSIS — N186 End stage renal disease: Secondary | ICD-10-CM | POA: Diagnosis not present

## 2018-12-14 DIAGNOSIS — Z992 Dependence on renal dialysis: Secondary | ICD-10-CM | POA: Diagnosis not present

## 2018-12-14 DIAGNOSIS — T8610 Unspecified complication of kidney transplant: Secondary | ICD-10-CM | POA: Diagnosis not present

## 2018-12-16 DIAGNOSIS — N186 End stage renal disease: Secondary | ICD-10-CM | POA: Diagnosis not present

## 2018-12-16 DIAGNOSIS — D631 Anemia in chronic kidney disease: Secondary | ICD-10-CM | POA: Diagnosis not present

## 2018-12-16 DIAGNOSIS — N2581 Secondary hyperparathyroidism of renal origin: Secondary | ICD-10-CM | POA: Diagnosis not present

## 2018-12-16 DIAGNOSIS — R739 Hyperglycemia, unspecified: Secondary | ICD-10-CM | POA: Diagnosis not present

## 2018-12-19 DIAGNOSIS — N2581 Secondary hyperparathyroidism of renal origin: Secondary | ICD-10-CM | POA: Diagnosis not present

## 2018-12-19 DIAGNOSIS — N186 End stage renal disease: Secondary | ICD-10-CM | POA: Diagnosis not present

## 2018-12-19 DIAGNOSIS — R739 Hyperglycemia, unspecified: Secondary | ICD-10-CM | POA: Diagnosis not present

## 2018-12-19 DIAGNOSIS — D631 Anemia in chronic kidney disease: Secondary | ICD-10-CM | POA: Diagnosis not present

## 2018-12-21 DIAGNOSIS — D631 Anemia in chronic kidney disease: Secondary | ICD-10-CM | POA: Diagnosis not present

## 2018-12-21 DIAGNOSIS — R739 Hyperglycemia, unspecified: Secondary | ICD-10-CM | POA: Diagnosis not present

## 2018-12-21 DIAGNOSIS — N2581 Secondary hyperparathyroidism of renal origin: Secondary | ICD-10-CM | POA: Diagnosis not present

## 2018-12-21 DIAGNOSIS — N186 End stage renal disease: Secondary | ICD-10-CM | POA: Diagnosis not present

## 2018-12-23 DIAGNOSIS — R739 Hyperglycemia, unspecified: Secondary | ICD-10-CM | POA: Diagnosis not present

## 2018-12-23 DIAGNOSIS — N186 End stage renal disease: Secondary | ICD-10-CM | POA: Diagnosis not present

## 2018-12-23 DIAGNOSIS — D631 Anemia in chronic kidney disease: Secondary | ICD-10-CM | POA: Diagnosis not present

## 2018-12-23 DIAGNOSIS — N2581 Secondary hyperparathyroidism of renal origin: Secondary | ICD-10-CM | POA: Diagnosis not present

## 2018-12-26 DIAGNOSIS — R739 Hyperglycemia, unspecified: Secondary | ICD-10-CM | POA: Diagnosis not present

## 2018-12-26 DIAGNOSIS — N186 End stage renal disease: Secondary | ICD-10-CM | POA: Diagnosis not present

## 2018-12-26 DIAGNOSIS — D631 Anemia in chronic kidney disease: Secondary | ICD-10-CM | POA: Diagnosis not present

## 2018-12-26 DIAGNOSIS — N2581 Secondary hyperparathyroidism of renal origin: Secondary | ICD-10-CM | POA: Diagnosis not present

## 2018-12-28 DIAGNOSIS — N2581 Secondary hyperparathyroidism of renal origin: Secondary | ICD-10-CM | POA: Diagnosis not present

## 2018-12-28 DIAGNOSIS — D631 Anemia in chronic kidney disease: Secondary | ICD-10-CM | POA: Diagnosis not present

## 2018-12-28 DIAGNOSIS — N186 End stage renal disease: Secondary | ICD-10-CM | POA: Diagnosis not present

## 2018-12-28 DIAGNOSIS — R739 Hyperglycemia, unspecified: Secondary | ICD-10-CM | POA: Diagnosis not present

## 2018-12-30 DIAGNOSIS — R739 Hyperglycemia, unspecified: Secondary | ICD-10-CM | POA: Diagnosis not present

## 2018-12-30 DIAGNOSIS — N2581 Secondary hyperparathyroidism of renal origin: Secondary | ICD-10-CM | POA: Diagnosis not present

## 2018-12-30 DIAGNOSIS — N186 End stage renal disease: Secondary | ICD-10-CM | POA: Diagnosis not present

## 2018-12-30 DIAGNOSIS — D631 Anemia in chronic kidney disease: Secondary | ICD-10-CM | POA: Diagnosis not present

## 2019-01-02 DIAGNOSIS — N186 End stage renal disease: Secondary | ICD-10-CM | POA: Diagnosis not present

## 2019-01-02 DIAGNOSIS — D631 Anemia in chronic kidney disease: Secondary | ICD-10-CM | POA: Diagnosis not present

## 2019-01-02 DIAGNOSIS — R739 Hyperglycemia, unspecified: Secondary | ICD-10-CM | POA: Diagnosis not present

## 2019-01-02 DIAGNOSIS — N2581 Secondary hyperparathyroidism of renal origin: Secondary | ICD-10-CM | POA: Diagnosis not present

## 2019-01-04 DIAGNOSIS — R739 Hyperglycemia, unspecified: Secondary | ICD-10-CM | POA: Diagnosis not present

## 2019-01-04 DIAGNOSIS — D631 Anemia in chronic kidney disease: Secondary | ICD-10-CM | POA: Diagnosis not present

## 2019-01-04 DIAGNOSIS — N2581 Secondary hyperparathyroidism of renal origin: Secondary | ICD-10-CM | POA: Diagnosis not present

## 2019-01-04 DIAGNOSIS — N186 End stage renal disease: Secondary | ICD-10-CM | POA: Diagnosis not present

## 2019-01-06 DIAGNOSIS — N186 End stage renal disease: Secondary | ICD-10-CM | POA: Diagnosis not present

## 2019-01-06 DIAGNOSIS — N2581 Secondary hyperparathyroidism of renal origin: Secondary | ICD-10-CM | POA: Diagnosis not present

## 2019-01-06 DIAGNOSIS — R739 Hyperglycemia, unspecified: Secondary | ICD-10-CM | POA: Diagnosis not present

## 2019-01-06 DIAGNOSIS — D631 Anemia in chronic kidney disease: Secondary | ICD-10-CM | POA: Diagnosis not present

## 2019-01-09 DIAGNOSIS — D631 Anemia in chronic kidney disease: Secondary | ICD-10-CM | POA: Diagnosis not present

## 2019-01-09 DIAGNOSIS — R739 Hyperglycemia, unspecified: Secondary | ICD-10-CM | POA: Diagnosis not present

## 2019-01-09 DIAGNOSIS — N2581 Secondary hyperparathyroidism of renal origin: Secondary | ICD-10-CM | POA: Diagnosis not present

## 2019-01-09 DIAGNOSIS — N186 End stage renal disease: Secondary | ICD-10-CM | POA: Diagnosis not present

## 2019-01-11 DIAGNOSIS — N186 End stage renal disease: Secondary | ICD-10-CM | POA: Diagnosis not present

## 2019-01-11 DIAGNOSIS — N2581 Secondary hyperparathyroidism of renal origin: Secondary | ICD-10-CM | POA: Diagnosis not present

## 2019-01-11 DIAGNOSIS — D631 Anemia in chronic kidney disease: Secondary | ICD-10-CM | POA: Diagnosis not present

## 2019-01-11 DIAGNOSIS — R739 Hyperglycemia, unspecified: Secondary | ICD-10-CM | POA: Diagnosis not present

## 2019-01-13 DIAGNOSIS — R739 Hyperglycemia, unspecified: Secondary | ICD-10-CM | POA: Diagnosis not present

## 2019-01-13 DIAGNOSIS — N186 End stage renal disease: Secondary | ICD-10-CM | POA: Diagnosis not present

## 2019-01-13 DIAGNOSIS — D631 Anemia in chronic kidney disease: Secondary | ICD-10-CM | POA: Diagnosis not present

## 2019-01-13 DIAGNOSIS — N2581 Secondary hyperparathyroidism of renal origin: Secondary | ICD-10-CM | POA: Diagnosis not present

## 2019-01-14 DIAGNOSIS — N186 End stage renal disease: Secondary | ICD-10-CM | POA: Diagnosis not present

## 2019-01-14 DIAGNOSIS — T8610 Unspecified complication of kidney transplant: Secondary | ICD-10-CM | POA: Diagnosis not present

## 2019-01-14 DIAGNOSIS — Z992 Dependence on renal dialysis: Secondary | ICD-10-CM | POA: Diagnosis not present

## 2019-01-16 DIAGNOSIS — R739 Hyperglycemia, unspecified: Secondary | ICD-10-CM | POA: Diagnosis not present

## 2019-01-16 DIAGNOSIS — D631 Anemia in chronic kidney disease: Secondary | ICD-10-CM | POA: Diagnosis not present

## 2019-01-16 DIAGNOSIS — N186 End stage renal disease: Secondary | ICD-10-CM | POA: Diagnosis not present

## 2019-01-16 DIAGNOSIS — N2581 Secondary hyperparathyroidism of renal origin: Secondary | ICD-10-CM | POA: Diagnosis not present

## 2019-01-18 DIAGNOSIS — N2581 Secondary hyperparathyroidism of renal origin: Secondary | ICD-10-CM | POA: Diagnosis not present

## 2019-01-18 DIAGNOSIS — D631 Anemia in chronic kidney disease: Secondary | ICD-10-CM | POA: Diagnosis not present

## 2019-01-18 DIAGNOSIS — R739 Hyperglycemia, unspecified: Secondary | ICD-10-CM | POA: Diagnosis not present

## 2019-01-18 DIAGNOSIS — N186 End stage renal disease: Secondary | ICD-10-CM | POA: Diagnosis not present

## 2019-01-20 DIAGNOSIS — R739 Hyperglycemia, unspecified: Secondary | ICD-10-CM | POA: Diagnosis not present

## 2019-01-20 DIAGNOSIS — N2581 Secondary hyperparathyroidism of renal origin: Secondary | ICD-10-CM | POA: Diagnosis not present

## 2019-01-20 DIAGNOSIS — D631 Anemia in chronic kidney disease: Secondary | ICD-10-CM | POA: Diagnosis not present

## 2019-01-20 DIAGNOSIS — N186 End stage renal disease: Secondary | ICD-10-CM | POA: Diagnosis not present

## 2019-01-23 DIAGNOSIS — R739 Hyperglycemia, unspecified: Secondary | ICD-10-CM | POA: Diagnosis not present

## 2019-01-23 DIAGNOSIS — N186 End stage renal disease: Secondary | ICD-10-CM | POA: Diagnosis not present

## 2019-01-23 DIAGNOSIS — N2581 Secondary hyperparathyroidism of renal origin: Secondary | ICD-10-CM | POA: Diagnosis not present

## 2019-01-23 DIAGNOSIS — D631 Anemia in chronic kidney disease: Secondary | ICD-10-CM | POA: Diagnosis not present

## 2019-01-25 DIAGNOSIS — N2581 Secondary hyperparathyroidism of renal origin: Secondary | ICD-10-CM | POA: Diagnosis not present

## 2019-01-25 DIAGNOSIS — R739 Hyperglycemia, unspecified: Secondary | ICD-10-CM | POA: Diagnosis not present

## 2019-01-25 DIAGNOSIS — N186 End stage renal disease: Secondary | ICD-10-CM | POA: Diagnosis not present

## 2019-01-25 DIAGNOSIS — D631 Anemia in chronic kidney disease: Secondary | ICD-10-CM | POA: Diagnosis not present

## 2019-01-27 DIAGNOSIS — D631 Anemia in chronic kidney disease: Secondary | ICD-10-CM | POA: Diagnosis not present

## 2019-01-27 DIAGNOSIS — N186 End stage renal disease: Secondary | ICD-10-CM | POA: Diagnosis not present

## 2019-01-27 DIAGNOSIS — N2581 Secondary hyperparathyroidism of renal origin: Secondary | ICD-10-CM | POA: Diagnosis not present

## 2019-01-27 DIAGNOSIS — R739 Hyperglycemia, unspecified: Secondary | ICD-10-CM | POA: Diagnosis not present

## 2019-01-30 DIAGNOSIS — N186 End stage renal disease: Secondary | ICD-10-CM | POA: Diagnosis not present

## 2019-01-30 DIAGNOSIS — N2581 Secondary hyperparathyroidism of renal origin: Secondary | ICD-10-CM | POA: Diagnosis not present

## 2019-01-30 DIAGNOSIS — D631 Anemia in chronic kidney disease: Secondary | ICD-10-CM | POA: Diagnosis not present

## 2019-01-30 DIAGNOSIS — R739 Hyperglycemia, unspecified: Secondary | ICD-10-CM | POA: Diagnosis not present

## 2019-02-01 DIAGNOSIS — R739 Hyperglycemia, unspecified: Secondary | ICD-10-CM | POA: Diagnosis not present

## 2019-02-01 DIAGNOSIS — N2581 Secondary hyperparathyroidism of renal origin: Secondary | ICD-10-CM | POA: Diagnosis not present

## 2019-02-01 DIAGNOSIS — N186 End stage renal disease: Secondary | ICD-10-CM | POA: Diagnosis not present

## 2019-02-01 DIAGNOSIS — D631 Anemia in chronic kidney disease: Secondary | ICD-10-CM | POA: Diagnosis not present

## 2019-02-03 DIAGNOSIS — N2581 Secondary hyperparathyroidism of renal origin: Secondary | ICD-10-CM | POA: Diagnosis not present

## 2019-02-03 DIAGNOSIS — R739 Hyperglycemia, unspecified: Secondary | ICD-10-CM | POA: Diagnosis not present

## 2019-02-03 DIAGNOSIS — D631 Anemia in chronic kidney disease: Secondary | ICD-10-CM | POA: Diagnosis not present

## 2019-02-03 DIAGNOSIS — N186 End stage renal disease: Secondary | ICD-10-CM | POA: Diagnosis not present

## 2019-02-06 DIAGNOSIS — D631 Anemia in chronic kidney disease: Secondary | ICD-10-CM | POA: Diagnosis not present

## 2019-02-06 DIAGNOSIS — N2581 Secondary hyperparathyroidism of renal origin: Secondary | ICD-10-CM | POA: Diagnosis not present

## 2019-02-06 DIAGNOSIS — N186 End stage renal disease: Secondary | ICD-10-CM | POA: Diagnosis not present

## 2019-02-06 DIAGNOSIS — R739 Hyperglycemia, unspecified: Secondary | ICD-10-CM | POA: Diagnosis not present

## 2019-02-08 DIAGNOSIS — R739 Hyperglycemia, unspecified: Secondary | ICD-10-CM | POA: Diagnosis not present

## 2019-02-08 DIAGNOSIS — N186 End stage renal disease: Secondary | ICD-10-CM | POA: Diagnosis not present

## 2019-02-08 DIAGNOSIS — N2581 Secondary hyperparathyroidism of renal origin: Secondary | ICD-10-CM | POA: Diagnosis not present

## 2019-02-08 DIAGNOSIS — D631 Anemia in chronic kidney disease: Secondary | ICD-10-CM | POA: Diagnosis not present

## 2019-02-10 DIAGNOSIS — D631 Anemia in chronic kidney disease: Secondary | ICD-10-CM | POA: Diagnosis not present

## 2019-02-10 DIAGNOSIS — N2581 Secondary hyperparathyroidism of renal origin: Secondary | ICD-10-CM | POA: Diagnosis not present

## 2019-02-10 DIAGNOSIS — N186 End stage renal disease: Secondary | ICD-10-CM | POA: Diagnosis not present

## 2019-02-10 DIAGNOSIS — R739 Hyperglycemia, unspecified: Secondary | ICD-10-CM | POA: Diagnosis not present

## 2019-02-12 DIAGNOSIS — N186 End stage renal disease: Secondary | ICD-10-CM | POA: Diagnosis not present

## 2019-02-12 DIAGNOSIS — Z992 Dependence on renal dialysis: Secondary | ICD-10-CM | POA: Diagnosis not present

## 2019-02-12 DIAGNOSIS — T8610 Unspecified complication of kidney transplant: Secondary | ICD-10-CM | POA: Diagnosis not present

## 2019-02-13 DIAGNOSIS — R739 Hyperglycemia, unspecified: Secondary | ICD-10-CM | POA: Diagnosis not present

## 2019-02-13 DIAGNOSIS — N2581 Secondary hyperparathyroidism of renal origin: Secondary | ICD-10-CM | POA: Diagnosis not present

## 2019-02-13 DIAGNOSIS — N186 End stage renal disease: Secondary | ICD-10-CM | POA: Diagnosis not present

## 2019-02-13 DIAGNOSIS — D631 Anemia in chronic kidney disease: Secondary | ICD-10-CM | POA: Diagnosis not present

## 2019-02-15 DIAGNOSIS — N186 End stage renal disease: Secondary | ICD-10-CM | POA: Diagnosis not present

## 2019-02-15 DIAGNOSIS — N2581 Secondary hyperparathyroidism of renal origin: Secondary | ICD-10-CM | POA: Diagnosis not present

## 2019-02-15 DIAGNOSIS — D631 Anemia in chronic kidney disease: Secondary | ICD-10-CM | POA: Diagnosis not present

## 2019-02-15 DIAGNOSIS — R739 Hyperglycemia, unspecified: Secondary | ICD-10-CM | POA: Diagnosis not present

## 2019-02-17 DIAGNOSIS — R739 Hyperglycemia, unspecified: Secondary | ICD-10-CM | POA: Diagnosis not present

## 2019-02-17 DIAGNOSIS — N2581 Secondary hyperparathyroidism of renal origin: Secondary | ICD-10-CM | POA: Diagnosis not present

## 2019-02-17 DIAGNOSIS — D631 Anemia in chronic kidney disease: Secondary | ICD-10-CM | POA: Diagnosis not present

## 2019-02-17 DIAGNOSIS — N186 End stage renal disease: Secondary | ICD-10-CM | POA: Diagnosis not present

## 2019-02-20 DIAGNOSIS — D631 Anemia in chronic kidney disease: Secondary | ICD-10-CM | POA: Diagnosis not present

## 2019-02-20 DIAGNOSIS — N186 End stage renal disease: Secondary | ICD-10-CM | POA: Diagnosis not present

## 2019-02-20 DIAGNOSIS — N2581 Secondary hyperparathyroidism of renal origin: Secondary | ICD-10-CM | POA: Diagnosis not present

## 2019-02-20 DIAGNOSIS — R739 Hyperglycemia, unspecified: Secondary | ICD-10-CM | POA: Diagnosis not present

## 2019-02-22 DIAGNOSIS — N186 End stage renal disease: Secondary | ICD-10-CM | POA: Diagnosis not present

## 2019-02-22 DIAGNOSIS — N2581 Secondary hyperparathyroidism of renal origin: Secondary | ICD-10-CM | POA: Diagnosis not present

## 2019-02-22 DIAGNOSIS — R739 Hyperglycemia, unspecified: Secondary | ICD-10-CM | POA: Diagnosis not present

## 2019-02-22 DIAGNOSIS — D631 Anemia in chronic kidney disease: Secondary | ICD-10-CM | POA: Diagnosis not present

## 2019-02-24 DIAGNOSIS — N2581 Secondary hyperparathyroidism of renal origin: Secondary | ICD-10-CM | POA: Diagnosis not present

## 2019-02-24 DIAGNOSIS — N186 End stage renal disease: Secondary | ICD-10-CM | POA: Diagnosis not present

## 2019-02-24 DIAGNOSIS — D631 Anemia in chronic kidney disease: Secondary | ICD-10-CM | POA: Diagnosis not present

## 2019-02-24 DIAGNOSIS — R739 Hyperglycemia, unspecified: Secondary | ICD-10-CM | POA: Diagnosis not present

## 2019-02-28 DIAGNOSIS — N2581 Secondary hyperparathyroidism of renal origin: Secondary | ICD-10-CM | POA: Diagnosis not present

## 2019-02-28 DIAGNOSIS — N186 End stage renal disease: Secondary | ICD-10-CM | POA: Diagnosis not present

## 2019-02-28 DIAGNOSIS — R739 Hyperglycemia, unspecified: Secondary | ICD-10-CM | POA: Diagnosis not present

## 2019-02-28 DIAGNOSIS — D631 Anemia in chronic kidney disease: Secondary | ICD-10-CM | POA: Diagnosis not present

## 2019-03-01 DIAGNOSIS — R739 Hyperglycemia, unspecified: Secondary | ICD-10-CM | POA: Diagnosis not present

## 2019-03-01 DIAGNOSIS — N2581 Secondary hyperparathyroidism of renal origin: Secondary | ICD-10-CM | POA: Diagnosis not present

## 2019-03-01 DIAGNOSIS — N186 End stage renal disease: Secondary | ICD-10-CM | POA: Diagnosis not present

## 2019-03-01 DIAGNOSIS — D631 Anemia in chronic kidney disease: Secondary | ICD-10-CM | POA: Diagnosis not present

## 2019-03-03 DIAGNOSIS — R739 Hyperglycemia, unspecified: Secondary | ICD-10-CM | POA: Diagnosis not present

## 2019-03-03 DIAGNOSIS — N2581 Secondary hyperparathyroidism of renal origin: Secondary | ICD-10-CM | POA: Diagnosis not present

## 2019-03-03 DIAGNOSIS — N186 End stage renal disease: Secondary | ICD-10-CM | POA: Diagnosis not present

## 2019-03-03 DIAGNOSIS — D631 Anemia in chronic kidney disease: Secondary | ICD-10-CM | POA: Diagnosis not present

## 2019-03-06 DIAGNOSIS — D631 Anemia in chronic kidney disease: Secondary | ICD-10-CM | POA: Diagnosis not present

## 2019-03-06 DIAGNOSIS — N186 End stage renal disease: Secondary | ICD-10-CM | POA: Diagnosis not present

## 2019-03-06 DIAGNOSIS — R739 Hyperglycemia, unspecified: Secondary | ICD-10-CM | POA: Diagnosis not present

## 2019-03-06 DIAGNOSIS — N2581 Secondary hyperparathyroidism of renal origin: Secondary | ICD-10-CM | POA: Diagnosis not present

## 2019-03-08 DIAGNOSIS — N2581 Secondary hyperparathyroidism of renal origin: Secondary | ICD-10-CM | POA: Diagnosis not present

## 2019-03-08 DIAGNOSIS — R739 Hyperglycemia, unspecified: Secondary | ICD-10-CM | POA: Diagnosis not present

## 2019-03-08 DIAGNOSIS — N186 End stage renal disease: Secondary | ICD-10-CM | POA: Diagnosis not present

## 2019-03-08 DIAGNOSIS — D631 Anemia in chronic kidney disease: Secondary | ICD-10-CM | POA: Diagnosis not present

## 2019-03-10 DIAGNOSIS — N186 End stage renal disease: Secondary | ICD-10-CM | POA: Diagnosis not present

## 2019-03-10 DIAGNOSIS — D631 Anemia in chronic kidney disease: Secondary | ICD-10-CM | POA: Diagnosis not present

## 2019-03-10 DIAGNOSIS — N2581 Secondary hyperparathyroidism of renal origin: Secondary | ICD-10-CM | POA: Diagnosis not present

## 2019-03-10 DIAGNOSIS — R739 Hyperglycemia, unspecified: Secondary | ICD-10-CM | POA: Diagnosis not present

## 2019-03-13 DIAGNOSIS — N2581 Secondary hyperparathyroidism of renal origin: Secondary | ICD-10-CM | POA: Diagnosis not present

## 2019-03-13 DIAGNOSIS — D631 Anemia in chronic kidney disease: Secondary | ICD-10-CM | POA: Diagnosis not present

## 2019-03-13 DIAGNOSIS — R739 Hyperglycemia, unspecified: Secondary | ICD-10-CM | POA: Diagnosis not present

## 2019-03-13 DIAGNOSIS — N186 End stage renal disease: Secondary | ICD-10-CM | POA: Diagnosis not present

## 2019-03-15 DIAGNOSIS — N2581 Secondary hyperparathyroidism of renal origin: Secondary | ICD-10-CM | POA: Diagnosis not present

## 2019-03-15 DIAGNOSIS — T8610 Unspecified complication of kidney transplant: Secondary | ICD-10-CM | POA: Diagnosis not present

## 2019-03-15 DIAGNOSIS — D631 Anemia in chronic kidney disease: Secondary | ICD-10-CM | POA: Diagnosis not present

## 2019-03-15 DIAGNOSIS — Z992 Dependence on renal dialysis: Secondary | ICD-10-CM | POA: Diagnosis not present

## 2019-03-15 DIAGNOSIS — N186 End stage renal disease: Secondary | ICD-10-CM | POA: Diagnosis not present

## 2019-03-15 DIAGNOSIS — R739 Hyperglycemia, unspecified: Secondary | ICD-10-CM | POA: Diagnosis not present

## 2019-03-17 DIAGNOSIS — N2581 Secondary hyperparathyroidism of renal origin: Secondary | ICD-10-CM | POA: Diagnosis not present

## 2019-03-17 DIAGNOSIS — N186 End stage renal disease: Secondary | ICD-10-CM | POA: Diagnosis not present

## 2019-03-17 DIAGNOSIS — R739 Hyperglycemia, unspecified: Secondary | ICD-10-CM | POA: Diagnosis not present

## 2019-03-17 DIAGNOSIS — D631 Anemia in chronic kidney disease: Secondary | ICD-10-CM | POA: Diagnosis not present

## 2019-03-20 DIAGNOSIS — N186 End stage renal disease: Secondary | ICD-10-CM | POA: Diagnosis not present

## 2019-03-20 DIAGNOSIS — R739 Hyperglycemia, unspecified: Secondary | ICD-10-CM | POA: Diagnosis not present

## 2019-03-20 DIAGNOSIS — D631 Anemia in chronic kidney disease: Secondary | ICD-10-CM | POA: Diagnosis not present

## 2019-03-20 DIAGNOSIS — N2581 Secondary hyperparathyroidism of renal origin: Secondary | ICD-10-CM | POA: Diagnosis not present

## 2019-03-22 DIAGNOSIS — R739 Hyperglycemia, unspecified: Secondary | ICD-10-CM | POA: Diagnosis not present

## 2019-03-22 DIAGNOSIS — D631 Anemia in chronic kidney disease: Secondary | ICD-10-CM | POA: Diagnosis not present

## 2019-03-22 DIAGNOSIS — N2581 Secondary hyperparathyroidism of renal origin: Secondary | ICD-10-CM | POA: Diagnosis not present

## 2019-03-22 DIAGNOSIS — N186 End stage renal disease: Secondary | ICD-10-CM | POA: Diagnosis not present

## 2019-03-24 DIAGNOSIS — N2581 Secondary hyperparathyroidism of renal origin: Secondary | ICD-10-CM | POA: Diagnosis not present

## 2019-03-24 DIAGNOSIS — R739 Hyperglycemia, unspecified: Secondary | ICD-10-CM | POA: Diagnosis not present

## 2019-03-24 DIAGNOSIS — N186 End stage renal disease: Secondary | ICD-10-CM | POA: Diagnosis not present

## 2019-03-24 DIAGNOSIS — D631 Anemia in chronic kidney disease: Secondary | ICD-10-CM | POA: Diagnosis not present

## 2019-03-27 DIAGNOSIS — N186 End stage renal disease: Secondary | ICD-10-CM | POA: Diagnosis not present

## 2019-03-27 DIAGNOSIS — D631 Anemia in chronic kidney disease: Secondary | ICD-10-CM | POA: Diagnosis not present

## 2019-03-27 DIAGNOSIS — R739 Hyperglycemia, unspecified: Secondary | ICD-10-CM | POA: Diagnosis not present

## 2019-03-27 DIAGNOSIS — N2581 Secondary hyperparathyroidism of renal origin: Secondary | ICD-10-CM | POA: Diagnosis not present

## 2019-03-29 DIAGNOSIS — R739 Hyperglycemia, unspecified: Secondary | ICD-10-CM | POA: Diagnosis not present

## 2019-03-29 DIAGNOSIS — N186 End stage renal disease: Secondary | ICD-10-CM | POA: Diagnosis not present

## 2019-03-29 DIAGNOSIS — D631 Anemia in chronic kidney disease: Secondary | ICD-10-CM | POA: Diagnosis not present

## 2019-03-29 DIAGNOSIS — N2581 Secondary hyperparathyroidism of renal origin: Secondary | ICD-10-CM | POA: Diagnosis not present

## 2019-03-30 ENCOUNTER — Other Ambulatory Visit: Payer: Self-pay

## 2019-03-30 ENCOUNTER — Ambulatory Visit (INDEPENDENT_AMBULATORY_CARE_PROVIDER_SITE_OTHER): Payer: Medicare Other | Admitting: Family

## 2019-03-30 ENCOUNTER — Encounter: Payer: Self-pay | Admitting: Family

## 2019-03-30 VITALS — BP 136/75 | HR 78 | Temp 97.6°F | Resp 16 | Ht 70.5 in | Wt 225.0 lb

## 2019-03-30 DIAGNOSIS — N186 End stage renal disease: Secondary | ICD-10-CM

## 2019-03-30 DIAGNOSIS — Z992 Dependence on renal dialysis: Secondary | ICD-10-CM | POA: Diagnosis not present

## 2019-03-30 DIAGNOSIS — T82898A Other specified complication of vascular prosthetic devices, implants and grafts, initial encounter: Secondary | ICD-10-CM

## 2019-03-30 NOTE — Progress Notes (Signed)
CC: aneurysmal left forearm AVF  History of Present Illness  Tim Baker is a 43 y.o. (10-Oct-1976) male who had an old left radiocephalic fistula that clotted proximally. He had a kidney transplant in 2013 that failed and was dialyzed with a catheter. He was seen in the office by Dr. Sherren Mocha Early and set up for revision of his fistula with planned to reanastomose the vein to the more proximal radial artery. This was done on 02/20/2016 by Dr. Scot Dock.  The pt returns today at the request of Dr. Arty Baumgartner to evaluate aneurysms on the AVF.   Pt dialyzes via left forearm AVF on M-W-F at [facility]   Pt denies any steal type symptoms in his left UE.    Past Medical History:  Diagnosis Date  . Allergy   . Anemia   . ESRD (end stage renal disease) on dialysis [facility])    "MWF; [facility]" (12/31/2015)  . GERD (gastroesophageal reflux disease)   . GI bleed   . H/O kidney transplant   . Heart murmur    "slight one"  . Hematochezia   . History of blood transfusion 12/31/2015   "this is my 1st" (12/31/2015)  . Hyperkalemia   . Hypertension   . Renal disorder     Social History Social History   Tobacco Use  . Smoking status: Never Smoker  . Smokeless tobacco: Never Used  Substance Use Topics  . Alcohol use: Yes    Comment: one beer a month  . Drug use: No    Family History Family History  Problem Relation Age of Onset  . Hypertension Father   . Glaucoma Father   . Breast cancer Mother     Surgical History Past Surgical History:  Procedure Laterality Date  . ARTERIOVENOUS GRAFT PLACEMENT Left 01/2006   forearm  . AV FISTULA PLACEMENT Left 02/20/2016   Procedure: REVISION OF LEFT ARTERIOVENOUS (AV) FISTULA;  Surgeon: Angelia Mould, MD;  Location: [facility];  Service: Vascular;  Laterality: Left;  . AV FISTULA REPAIR  11/2015   "tried unsuccessfully to declot it with a balloon at [facility]"  . COLONOSCOPY N/A 01/02/2016   Procedure:  COLONOSCOPY;  Surgeon: Carol Ada, MD;  Location: [facility] ENDOSCOPY;  Service: Endoscopy;  Laterality: N/A;  . ESOPHAGOGASTRODUODENOSCOPY N/A 01/02/2016   Procedure: ESOPHAGOGASTRODUODENOSCOPY (EGD);  Surgeon: Carol Ada, MD;  Location: [facility] ENDOSCOPY;  Service: Endoscopy;  Laterality: N/A;  . INSERTION OF DIALYSIS CATHETER Right 11/2015   "neck; a temporary one"  . INSERTION OF DIALYSIS CATHETER Right 11/2015   chest  . KIDNEY TRANSPLANT  2013    No Known Allergies  Current Outpatient Medications  Medication Sig Dispense Refill  . amLODipine (NORVASC) 10 MG tablet Take 10 mg by mouth at bedtime.     . Azelastine-Fluticasone (DYMISTA) 137-50 MCG/ACT SUSP Place 1 spray into the nose as needed (allergies).     . Bepotastine Besilate (BEPREVE) 1.5 % SOLN Apply 1 drop to eye as needed (allergies).     . calcium elemental as carbonate (TUMS ULTRA 1000) 400 MG chewable tablet Chew 2,000 mg by mouth 3 (three) times daily.    . fluconazole (DIFLUCAN) 50 MG tablet Take 50 mg by mouth daily. Reported on 03/25/2016  0  . labetalol (NORMODYNE) 300 MG tablet Take 600 mg by mouth 2 (two) times daily.     . mycophenolate (MYFORTIC) 180 MG EC tablet Take 540 mg by mouth 2 (two) times daily. Takes 3 (  180 mg tablets) twice daily.    . pantoprazole (PROTONIX) 40 MG tablet TAKE 1 TABLET BY MOUTH TWICE A DAY FOR 8 WEEKS UNTIL 3/23, THEN 1 DAILY UNLESS OTHERWISE DIRECTED  0  . oxyCODONE-acetaminophen (ROXICET) 5-325 MG tablet Take 1-2 tablets by mouth every 4 (four) hours as needed. (Patient not taking: Reported on 03/30/2019) 30 tablet 0  . predniSONE (DELTASONE) 5 MG tablet Take 20 mg by mouth daily with breakfast.    . sodium bicarbonate 650 MG tablet Take 650-1,300 mg by mouth 2 (two) times daily. Reported on 03/25/2016  0  . sulfamethoxazole-trimethoprim (BACTRIM,SEPTRA) 400-80 MG tablet Take 1 tablet by mouth every Monday, Wednesday, and Friday.  4  . tacrolimus (PROGRAF) 1 MG capsule Take by mouth. Take two 1mg   tablet bid    . valGANciclovir (VALCYTE) 450 MG tablet Take 450 mg by mouth every Monday, Wednesday, and Friday. Reported on 03/25/2016     No current facility-administered medications for this visit.      REVIEW OF SYSTEMS: see HPI for pertinent positives and negatives    PHYSICAL EXAMINATION:  Vitals:   03/30/19 1213  BP: 136/75  Pulse: 78  Resp: 16  Temp: 97.6 F (36.4 C)  TempSrc: Oral  SpO2: 98%  Weight: 225 lb (102.1 kg)  Height: 5' 10.5" (1.791 m)   Body mass index is 31.83 kg/m.  General: Obese male appears his stated age.   HEENT:  No gross abnormalities Pulmonary: Respirations are non-labored Abdomen: Soft and non-tender  Musculoskeletal: There are no major deformities. Neurologic: No focal weakness or paresthesias are detected, Skin: There are no ulcer or rashes noted. Psychiatric: The patient has normal affect. Cardiovascular: There is a regular rate and rhythm without significant murmur appreciated.  Bialteral radial pulses are 2+ palpable. Left forearm AVF with palpable thrill and audible bruit. 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       Left forearm AVF    Medical Decision Making  Tim Baker is a 43 y.o. male who is s/p Revision of left radial cephalic AV fistula on 02-18-15 by Dr. Scot Dock. The pr returns at the request of Dr. Arty Baumgartner to evaluate aneurysms on the AVF.  Dr. Oneida Alar spoke with and examined pt.  Dr. Oneida Alar expressed concern about the more proximal aneurysm as potential for bleeding. Skin pinched over both aneurysms is fairly thick, no ulcerations.  Dr. Oneida Alar offered to schedule pt for plication of aneurysms on a non HD day, but pt declined at this time. Dr. Oneida Alar advised pt to apply pressure to his AVF if his AVF bleeds, and to call [phone]. Pt denies any history of bleeding from his AVF.  Dr. Oneida Alar and I advised pt to let us know if he changes his mind, and wants to have the  procedure done to plicate the aneurysms.    Clemon Chambers, RN, MSN, FNP-C [facility] [place] Office: [phone]  03/30/2019, 12:27 PM  Clinic MD: Oneida Alar

## 2019-03-30 NOTE — Patient Instructions (Signed)
Before your next abdominal ultrasound:  Avoid gas forming foods and beverages the day before the test.   Take two Extra-Strength Gas-X capsules at bedtime the night before the test. Take another two Extra-Strength Gas-X capsules in the middle of the night if you get up to the restroom, if not, first thing in the morning with water.  Do not chew gum.      Peripheral Vascular Disease  Peripheral vascular disease (PVD) is a disease of the blood vessels that are not part of your heart and brain. A simple term for PVD is poor circulation. In most cases, PVD narrows the blood vessels that carry blood from your heart to the rest of your body. This can reduce the supply of blood to your arms, legs, and internal organs, like your stomach or kidneys. However, PVD most often affects a person's lower legs and feet. Without treatment, PVD tends to get worse. PVD can also lead to acute ischemic limb. This is when an arm or leg suddenly cannot get enough blood. This is a medical emergency. Follow these instructions at home: Lifestyle  Do not use any products that contain nicotine or tobacco, such as cigarettes and e-cigarettes. If you need help quitting, ask your doctor.  Lose weight if you are overweight. Or, stay at a healthy weight as told by your doctor.  Eat a diet that is low in fat and cholesterol. If you need help, ask your doctor.  Exercise regularly. Ask your doctor for activities that are right for you. General instructions  Take over-the-counter and prescription medicines only as told by your doctor.  Take good care of your feet: ? Wear comfortable shoes that fit well. ? Check your feet often for any cuts or sores.  Keep all follow-up visits as told by your doctor This is important. Contact a doctor if:  You have cramps in your legs when you walk.  You have leg pain when you are at rest.  You have coldness in a leg or foot.  Your skin changes.  You are unable to get or have an  erection (erectile dysfunction).  You have cuts or sores on your feet that do not heal. Get help right away if:  Your arm or leg turns cold, numb, and blue.  Your arms or legs become red, warm, swollen, painful, or numb.  You have chest pain.  You have trouble breathing.  You suddenly have weakness in your face, arm, or leg.  You become very confused or you cannot speak.  You suddenly have a very bad headache.  You suddenly cannot see. Summary  Peripheral vascular disease (PVD) is a disease of the blood vessels.  A simple term for PVD is poor circulation. Without treatment, PVD tends to get worse.  Treatment may include exercise, low fat and low cholesterol diet, and quitting smoking. This information is not intended to replace advice given to you by your health care provider. Make sure you discuss any questions you have with your health care provider. Document Released: 02/24/2010 Document Revised: 01/07/2017 Document Reviewed: 01/07/2017 Elsevier Interactive Patient Education  2019 [employer] Inc.  

## 2019-03-31 DIAGNOSIS — D631 Anemia in chronic kidney disease: Secondary | ICD-10-CM | POA: Diagnosis not present

## 2019-03-31 DIAGNOSIS — N2581 Secondary hyperparathyroidism of renal origin: Secondary | ICD-10-CM | POA: Diagnosis not present

## 2019-03-31 DIAGNOSIS — R739 Hyperglycemia, unspecified: Secondary | ICD-10-CM | POA: Diagnosis not present

## 2019-03-31 DIAGNOSIS — N186 End stage renal disease: Secondary | ICD-10-CM | POA: Diagnosis not present

## 2019-04-03 DIAGNOSIS — N186 End stage renal disease: Secondary | ICD-10-CM | POA: Diagnosis not present

## 2019-04-03 DIAGNOSIS — D631 Anemia in chronic kidney disease: Secondary | ICD-10-CM | POA: Diagnosis not present

## 2019-04-03 DIAGNOSIS — N2581 Secondary hyperparathyroidism of renal origin: Secondary | ICD-10-CM | POA: Diagnosis not present

## 2019-04-03 DIAGNOSIS — R739 Hyperglycemia, unspecified: Secondary | ICD-10-CM | POA: Diagnosis not present

## 2019-04-05 DIAGNOSIS — D631 Anemia in chronic kidney disease: Secondary | ICD-10-CM | POA: Diagnosis not present

## 2019-04-05 DIAGNOSIS — N186 End stage renal disease: Secondary | ICD-10-CM | POA: Diagnosis not present

## 2019-04-05 DIAGNOSIS — N2581 Secondary hyperparathyroidism of renal origin: Secondary | ICD-10-CM | POA: Diagnosis not present

## 2019-04-05 DIAGNOSIS — R739 Hyperglycemia, unspecified: Secondary | ICD-10-CM | POA: Diagnosis not present

## 2019-04-07 DIAGNOSIS — N2581 Secondary hyperparathyroidism of renal origin: Secondary | ICD-10-CM | POA: Diagnosis not present

## 2019-04-07 DIAGNOSIS — R739 Hyperglycemia, unspecified: Secondary | ICD-10-CM | POA: Diagnosis not present

## 2019-04-07 DIAGNOSIS — D631 Anemia in chronic kidney disease: Secondary | ICD-10-CM | POA: Diagnosis not present

## 2019-04-07 DIAGNOSIS — N186 End stage renal disease: Secondary | ICD-10-CM | POA: Diagnosis not present

## 2019-04-10 DIAGNOSIS — N2581 Secondary hyperparathyroidism of renal origin: Secondary | ICD-10-CM | POA: Diagnosis not present

## 2019-04-10 DIAGNOSIS — R739 Hyperglycemia, unspecified: Secondary | ICD-10-CM | POA: Diagnosis not present

## 2019-04-10 DIAGNOSIS — N186 End stage renal disease: Secondary | ICD-10-CM | POA: Diagnosis not present

## 2019-04-10 DIAGNOSIS — D631 Anemia in chronic kidney disease: Secondary | ICD-10-CM | POA: Diagnosis not present

## 2019-04-12 DIAGNOSIS — R739 Hyperglycemia, unspecified: Secondary | ICD-10-CM | POA: Diagnosis not present

## 2019-04-12 DIAGNOSIS — N2581 Secondary hyperparathyroidism of renal origin: Secondary | ICD-10-CM | POA: Diagnosis not present

## 2019-04-12 DIAGNOSIS — D631 Anemia in chronic kidney disease: Secondary | ICD-10-CM | POA: Diagnosis not present

## 2019-04-12 DIAGNOSIS — N186 End stage renal disease: Secondary | ICD-10-CM | POA: Diagnosis not present

## 2019-04-14 DIAGNOSIS — D631 Anemia in chronic kidney disease: Secondary | ICD-10-CM | POA: Diagnosis not present

## 2019-04-14 DIAGNOSIS — N2581 Secondary hyperparathyroidism of renal origin: Secondary | ICD-10-CM | POA: Diagnosis not present

## 2019-04-14 DIAGNOSIS — Z992 Dependence on renal dialysis: Secondary | ICD-10-CM | POA: Diagnosis not present

## 2019-04-14 DIAGNOSIS — R739 Hyperglycemia, unspecified: Secondary | ICD-10-CM | POA: Diagnosis not present

## 2019-04-14 DIAGNOSIS — T8610 Unspecified complication of kidney transplant: Secondary | ICD-10-CM | POA: Diagnosis not present

## 2019-04-14 DIAGNOSIS — N186 End stage renal disease: Secondary | ICD-10-CM | POA: Diagnosis not present

## 2019-04-17 DIAGNOSIS — N2581 Secondary hyperparathyroidism of renal origin: Secondary | ICD-10-CM | POA: Diagnosis not present

## 2019-04-17 DIAGNOSIS — N186 End stage renal disease: Secondary | ICD-10-CM | POA: Diagnosis not present

## 2019-04-17 DIAGNOSIS — D631 Anemia in chronic kidney disease: Secondary | ICD-10-CM | POA: Diagnosis not present

## 2019-04-17 DIAGNOSIS — R739 Hyperglycemia, unspecified: Secondary | ICD-10-CM | POA: Diagnosis not present

## 2019-04-19 DIAGNOSIS — D631 Anemia in chronic kidney disease: Secondary | ICD-10-CM | POA: Diagnosis not present

## 2019-04-19 DIAGNOSIS — N2581 Secondary hyperparathyroidism of renal origin: Secondary | ICD-10-CM | POA: Diagnosis not present

## 2019-04-19 DIAGNOSIS — R739 Hyperglycemia, unspecified: Secondary | ICD-10-CM | POA: Diagnosis not present

## 2019-04-19 DIAGNOSIS — N186 End stage renal disease: Secondary | ICD-10-CM | POA: Diagnosis not present

## 2019-04-21 DIAGNOSIS — R739 Hyperglycemia, unspecified: Secondary | ICD-10-CM | POA: Diagnosis not present

## 2019-04-21 DIAGNOSIS — N2581 Secondary hyperparathyroidism of renal origin: Secondary | ICD-10-CM | POA: Diagnosis not present

## 2019-04-21 DIAGNOSIS — D631 Anemia in chronic kidney disease: Secondary | ICD-10-CM | POA: Diagnosis not present

## 2019-04-21 DIAGNOSIS — N186 End stage renal disease: Secondary | ICD-10-CM | POA: Diagnosis not present

## 2019-04-24 DIAGNOSIS — N2581 Secondary hyperparathyroidism of renal origin: Secondary | ICD-10-CM | POA: Diagnosis not present

## 2019-04-24 DIAGNOSIS — N186 End stage renal disease: Secondary | ICD-10-CM | POA: Diagnosis not present

## 2019-04-24 DIAGNOSIS — D631 Anemia in chronic kidney disease: Secondary | ICD-10-CM | POA: Diagnosis not present

## 2019-04-24 DIAGNOSIS — R739 Hyperglycemia, unspecified: Secondary | ICD-10-CM | POA: Diagnosis not present

## 2019-04-26 DIAGNOSIS — N186 End stage renal disease: Secondary | ICD-10-CM | POA: Diagnosis not present

## 2019-04-26 DIAGNOSIS — N2581 Secondary hyperparathyroidism of renal origin: Secondary | ICD-10-CM | POA: Diagnosis not present

## 2019-04-26 DIAGNOSIS — R739 Hyperglycemia, unspecified: Secondary | ICD-10-CM | POA: Diagnosis not present

## 2019-04-26 DIAGNOSIS — D631 Anemia in chronic kidney disease: Secondary | ICD-10-CM | POA: Diagnosis not present

## 2019-04-28 DIAGNOSIS — D631 Anemia in chronic kidney disease: Secondary | ICD-10-CM | POA: Diagnosis not present

## 2019-04-28 DIAGNOSIS — N2581 Secondary hyperparathyroidism of renal origin: Secondary | ICD-10-CM | POA: Diagnosis not present

## 2019-04-28 DIAGNOSIS — N186 End stage renal disease: Secondary | ICD-10-CM | POA: Diagnosis not present

## 2019-04-28 DIAGNOSIS — R739 Hyperglycemia, unspecified: Secondary | ICD-10-CM | POA: Diagnosis not present

## 2019-05-01 DIAGNOSIS — N2581 Secondary hyperparathyroidism of renal origin: Secondary | ICD-10-CM | POA: Diagnosis not present

## 2019-05-01 DIAGNOSIS — R739 Hyperglycemia, unspecified: Secondary | ICD-10-CM | POA: Diagnosis not present

## 2019-05-01 DIAGNOSIS — N186 End stage renal disease: Secondary | ICD-10-CM | POA: Diagnosis not present

## 2019-05-01 DIAGNOSIS — D631 Anemia in chronic kidney disease: Secondary | ICD-10-CM | POA: Diagnosis not present

## 2019-05-03 DIAGNOSIS — R739 Hyperglycemia, unspecified: Secondary | ICD-10-CM | POA: Diagnosis not present

## 2019-05-03 DIAGNOSIS — N186 End stage renal disease: Secondary | ICD-10-CM | POA: Diagnosis not present

## 2019-05-03 DIAGNOSIS — N2581 Secondary hyperparathyroidism of renal origin: Secondary | ICD-10-CM | POA: Diagnosis not present

## 2019-05-03 DIAGNOSIS — D631 Anemia in chronic kidney disease: Secondary | ICD-10-CM | POA: Diagnosis not present

## 2019-05-05 DIAGNOSIS — R739 Hyperglycemia, unspecified: Secondary | ICD-10-CM | POA: Diagnosis not present

## 2019-05-05 DIAGNOSIS — N2581 Secondary hyperparathyroidism of renal origin: Secondary | ICD-10-CM | POA: Diagnosis not present

## 2019-05-05 DIAGNOSIS — D631 Anemia in chronic kidney disease: Secondary | ICD-10-CM | POA: Diagnosis not present

## 2019-05-05 DIAGNOSIS — N186 End stage renal disease: Secondary | ICD-10-CM | POA: Diagnosis not present

## 2019-05-08 DIAGNOSIS — R739 Hyperglycemia, unspecified: Secondary | ICD-10-CM | POA: Diagnosis not present

## 2019-05-08 DIAGNOSIS — N2581 Secondary hyperparathyroidism of renal origin: Secondary | ICD-10-CM | POA: Diagnosis not present

## 2019-05-08 DIAGNOSIS — D631 Anemia in chronic kidney disease: Secondary | ICD-10-CM | POA: Diagnosis not present

## 2019-05-08 DIAGNOSIS — N186 End stage renal disease: Secondary | ICD-10-CM | POA: Diagnosis not present

## 2019-05-10 DIAGNOSIS — R739 Hyperglycemia, unspecified: Secondary | ICD-10-CM | POA: Diagnosis not present

## 2019-05-10 DIAGNOSIS — N186 End stage renal disease: Secondary | ICD-10-CM | POA: Diagnosis not present

## 2019-05-10 DIAGNOSIS — N2581 Secondary hyperparathyroidism of renal origin: Secondary | ICD-10-CM | POA: Diagnosis not present

## 2019-05-10 DIAGNOSIS — D631 Anemia in chronic kidney disease: Secondary | ICD-10-CM | POA: Diagnosis not present

## 2019-05-12 DIAGNOSIS — D631 Anemia in chronic kidney disease: Secondary | ICD-10-CM | POA: Diagnosis not present

## 2019-05-12 DIAGNOSIS — N2581 Secondary hyperparathyroidism of renal origin: Secondary | ICD-10-CM | POA: Diagnosis not present

## 2019-05-12 DIAGNOSIS — N186 End stage renal disease: Secondary | ICD-10-CM | POA: Diagnosis not present

## 2019-05-12 DIAGNOSIS — R739 Hyperglycemia, unspecified: Secondary | ICD-10-CM | POA: Diagnosis not present

## 2019-05-15 DIAGNOSIS — T8610 Unspecified complication of kidney transplant: Secondary | ICD-10-CM | POA: Diagnosis not present

## 2019-05-15 DIAGNOSIS — N2581 Secondary hyperparathyroidism of renal origin: Secondary | ICD-10-CM | POA: Diagnosis not present

## 2019-05-15 DIAGNOSIS — Z992 Dependence on renal dialysis: Secondary | ICD-10-CM | POA: Diagnosis not present

## 2019-05-15 DIAGNOSIS — D631 Anemia in chronic kidney disease: Secondary | ICD-10-CM | POA: Diagnosis not present

## 2019-05-15 DIAGNOSIS — N186 End stage renal disease: Secondary | ICD-10-CM | POA: Diagnosis not present

## 2019-05-15 DIAGNOSIS — R739 Hyperglycemia, unspecified: Secondary | ICD-10-CM | POA: Diagnosis not present

## 2019-05-17 DIAGNOSIS — R739 Hyperglycemia, unspecified: Secondary | ICD-10-CM | POA: Diagnosis not present

## 2019-05-17 DIAGNOSIS — N2581 Secondary hyperparathyroidism of renal origin: Secondary | ICD-10-CM | POA: Diagnosis not present

## 2019-05-17 DIAGNOSIS — D631 Anemia in chronic kidney disease: Secondary | ICD-10-CM | POA: Diagnosis not present

## 2019-05-17 DIAGNOSIS — N186 End stage renal disease: Secondary | ICD-10-CM | POA: Diagnosis not present

## 2019-05-19 DIAGNOSIS — N186 End stage renal disease: Secondary | ICD-10-CM | POA: Diagnosis not present

## 2019-05-19 DIAGNOSIS — N2581 Secondary hyperparathyroidism of renal origin: Secondary | ICD-10-CM | POA: Diagnosis not present

## 2019-05-19 DIAGNOSIS — D631 Anemia in chronic kidney disease: Secondary | ICD-10-CM | POA: Diagnosis not present

## 2019-05-19 DIAGNOSIS — R739 Hyperglycemia, unspecified: Secondary | ICD-10-CM | POA: Diagnosis not present

## 2019-05-22 DIAGNOSIS — N186 End stage renal disease: Secondary | ICD-10-CM | POA: Diagnosis not present

## 2019-05-22 DIAGNOSIS — N2581 Secondary hyperparathyroidism of renal origin: Secondary | ICD-10-CM | POA: Diagnosis not present

## 2019-05-22 DIAGNOSIS — R739 Hyperglycemia, unspecified: Secondary | ICD-10-CM | POA: Diagnosis not present

## 2019-05-22 DIAGNOSIS — D631 Anemia in chronic kidney disease: Secondary | ICD-10-CM | POA: Diagnosis not present

## 2019-05-24 DIAGNOSIS — D631 Anemia in chronic kidney disease: Secondary | ICD-10-CM | POA: Diagnosis not present

## 2019-05-24 DIAGNOSIS — R739 Hyperglycemia, unspecified: Secondary | ICD-10-CM | POA: Diagnosis not present

## 2019-05-24 DIAGNOSIS — N186 End stage renal disease: Secondary | ICD-10-CM | POA: Diagnosis not present

## 2019-05-24 DIAGNOSIS — N2581 Secondary hyperparathyroidism of renal origin: Secondary | ICD-10-CM | POA: Diagnosis not present

## 2019-05-26 DIAGNOSIS — N2581 Secondary hyperparathyroidism of renal origin: Secondary | ICD-10-CM | POA: Diagnosis not present

## 2019-05-26 DIAGNOSIS — R739 Hyperglycemia, unspecified: Secondary | ICD-10-CM | POA: Diagnosis not present

## 2019-05-26 DIAGNOSIS — D631 Anemia in chronic kidney disease: Secondary | ICD-10-CM | POA: Diagnosis not present

## 2019-05-26 DIAGNOSIS — N186 End stage renal disease: Secondary | ICD-10-CM | POA: Diagnosis not present

## 2019-05-29 DIAGNOSIS — N186 End stage renal disease: Secondary | ICD-10-CM | POA: Diagnosis not present

## 2019-05-29 DIAGNOSIS — N2581 Secondary hyperparathyroidism of renal origin: Secondary | ICD-10-CM | POA: Diagnosis not present

## 2019-05-29 DIAGNOSIS — D631 Anemia in chronic kidney disease: Secondary | ICD-10-CM | POA: Diagnosis not present

## 2019-05-29 DIAGNOSIS — R739 Hyperglycemia, unspecified: Secondary | ICD-10-CM | POA: Diagnosis not present

## 2019-05-31 DIAGNOSIS — R739 Hyperglycemia, unspecified: Secondary | ICD-10-CM | POA: Diagnosis not present

## 2019-05-31 DIAGNOSIS — N186 End stage renal disease: Secondary | ICD-10-CM | POA: Diagnosis not present

## 2019-05-31 DIAGNOSIS — D631 Anemia in chronic kidney disease: Secondary | ICD-10-CM | POA: Diagnosis not present

## 2019-05-31 DIAGNOSIS — N2581 Secondary hyperparathyroidism of renal origin: Secondary | ICD-10-CM | POA: Diagnosis not present

## 2019-06-02 DIAGNOSIS — R739 Hyperglycemia, unspecified: Secondary | ICD-10-CM | POA: Diagnosis not present

## 2019-06-02 DIAGNOSIS — N2581 Secondary hyperparathyroidism of renal origin: Secondary | ICD-10-CM | POA: Diagnosis not present

## 2019-06-02 DIAGNOSIS — N186 End stage renal disease: Secondary | ICD-10-CM | POA: Diagnosis not present

## 2019-06-02 DIAGNOSIS — D631 Anemia in chronic kidney disease: Secondary | ICD-10-CM | POA: Diagnosis not present

## 2019-06-05 DIAGNOSIS — R739 Hyperglycemia, unspecified: Secondary | ICD-10-CM | POA: Diagnosis not present

## 2019-06-05 DIAGNOSIS — N186 End stage renal disease: Secondary | ICD-10-CM | POA: Diagnosis not present

## 2019-06-05 DIAGNOSIS — N2581 Secondary hyperparathyroidism of renal origin: Secondary | ICD-10-CM | POA: Diagnosis not present

## 2019-06-05 DIAGNOSIS — D631 Anemia in chronic kidney disease: Secondary | ICD-10-CM | POA: Diagnosis not present

## 2019-06-07 DIAGNOSIS — D631 Anemia in chronic kidney disease: Secondary | ICD-10-CM | POA: Diagnosis not present

## 2019-06-07 DIAGNOSIS — N186 End stage renal disease: Secondary | ICD-10-CM | POA: Diagnosis not present

## 2019-06-07 DIAGNOSIS — R739 Hyperglycemia, unspecified: Secondary | ICD-10-CM | POA: Diagnosis not present

## 2019-06-07 DIAGNOSIS — N2581 Secondary hyperparathyroidism of renal origin: Secondary | ICD-10-CM | POA: Diagnosis not present

## 2019-06-09 DIAGNOSIS — N2581 Secondary hyperparathyroidism of renal origin: Secondary | ICD-10-CM | POA: Diagnosis not present

## 2019-06-09 DIAGNOSIS — N186 End stage renal disease: Secondary | ICD-10-CM | POA: Diagnosis not present

## 2019-06-09 DIAGNOSIS — R739 Hyperglycemia, unspecified: Secondary | ICD-10-CM | POA: Diagnosis not present

## 2019-06-09 DIAGNOSIS — D631 Anemia in chronic kidney disease: Secondary | ICD-10-CM | POA: Diagnosis not present

## 2019-06-12 DIAGNOSIS — R739 Hyperglycemia, unspecified: Secondary | ICD-10-CM | POA: Diagnosis not present

## 2019-06-12 DIAGNOSIS — N186 End stage renal disease: Secondary | ICD-10-CM | POA: Diagnosis not present

## 2019-06-12 DIAGNOSIS — D631 Anemia in chronic kidney disease: Secondary | ICD-10-CM | POA: Diagnosis not present

## 2019-06-12 DIAGNOSIS — N2581 Secondary hyperparathyroidism of renal origin: Secondary | ICD-10-CM | POA: Diagnosis not present

## 2019-06-14 DIAGNOSIS — N186 End stage renal disease: Secondary | ICD-10-CM | POA: Diagnosis not present

## 2019-06-14 DIAGNOSIS — T8610 Unspecified complication of kidney transplant: Secondary | ICD-10-CM | POA: Diagnosis not present

## 2019-06-14 DIAGNOSIS — D631 Anemia in chronic kidney disease: Secondary | ICD-10-CM | POA: Diagnosis not present

## 2019-06-14 DIAGNOSIS — Z992 Dependence on renal dialysis: Secondary | ICD-10-CM | POA: Diagnosis not present

## 2019-06-14 DIAGNOSIS — R739 Hyperglycemia, unspecified: Secondary | ICD-10-CM | POA: Diagnosis not present

## 2019-06-14 DIAGNOSIS — N2581 Secondary hyperparathyroidism of renal origin: Secondary | ICD-10-CM | POA: Diagnosis not present

## 2019-06-16 DIAGNOSIS — N186 End stage renal disease: Secondary | ICD-10-CM | POA: Diagnosis not present

## 2019-06-16 DIAGNOSIS — D631 Anemia in chronic kidney disease: Secondary | ICD-10-CM | POA: Diagnosis not present

## 2019-06-16 DIAGNOSIS — N2581 Secondary hyperparathyroidism of renal origin: Secondary | ICD-10-CM | POA: Diagnosis not present

## 2019-06-16 DIAGNOSIS — R739 Hyperglycemia, unspecified: Secondary | ICD-10-CM | POA: Diagnosis not present

## 2019-06-19 DIAGNOSIS — R739 Hyperglycemia, unspecified: Secondary | ICD-10-CM | POA: Diagnosis not present

## 2019-06-19 DIAGNOSIS — N186 End stage renal disease: Secondary | ICD-10-CM | POA: Diagnosis not present

## 2019-06-19 DIAGNOSIS — N2581 Secondary hyperparathyroidism of renal origin: Secondary | ICD-10-CM | POA: Diagnosis not present

## 2019-06-19 DIAGNOSIS — D631 Anemia in chronic kidney disease: Secondary | ICD-10-CM | POA: Diagnosis not present

## 2019-06-21 DIAGNOSIS — D631 Anemia in chronic kidney disease: Secondary | ICD-10-CM | POA: Diagnosis not present

## 2019-06-21 DIAGNOSIS — R739 Hyperglycemia, unspecified: Secondary | ICD-10-CM | POA: Diagnosis not present

## 2019-06-21 DIAGNOSIS — N2581 Secondary hyperparathyroidism of renal origin: Secondary | ICD-10-CM | POA: Diagnosis not present

## 2019-06-21 DIAGNOSIS — N186 End stage renal disease: Secondary | ICD-10-CM | POA: Diagnosis not present

## 2019-06-23 DIAGNOSIS — D631 Anemia in chronic kidney disease: Secondary | ICD-10-CM | POA: Diagnosis not present

## 2019-06-23 DIAGNOSIS — N2581 Secondary hyperparathyroidism of renal origin: Secondary | ICD-10-CM | POA: Diagnosis not present

## 2019-06-23 DIAGNOSIS — N186 End stage renal disease: Secondary | ICD-10-CM | POA: Diagnosis not present

## 2019-06-23 DIAGNOSIS — R739 Hyperglycemia, unspecified: Secondary | ICD-10-CM | POA: Diagnosis not present

## 2019-06-26 DIAGNOSIS — N2581 Secondary hyperparathyroidism of renal origin: Secondary | ICD-10-CM | POA: Diagnosis not present

## 2019-06-26 DIAGNOSIS — D631 Anemia in chronic kidney disease: Secondary | ICD-10-CM | POA: Diagnosis not present

## 2019-06-26 DIAGNOSIS — N186 End stage renal disease: Secondary | ICD-10-CM | POA: Diagnosis not present

## 2019-06-26 DIAGNOSIS — R739 Hyperglycemia, unspecified: Secondary | ICD-10-CM | POA: Diagnosis not present

## 2019-06-28 DIAGNOSIS — N186 End stage renal disease: Secondary | ICD-10-CM | POA: Diagnosis not present

## 2019-06-28 DIAGNOSIS — N2581 Secondary hyperparathyroidism of renal origin: Secondary | ICD-10-CM | POA: Diagnosis not present

## 2019-06-28 DIAGNOSIS — R739 Hyperglycemia, unspecified: Secondary | ICD-10-CM | POA: Diagnosis not present

## 2019-06-28 DIAGNOSIS — D631 Anemia in chronic kidney disease: Secondary | ICD-10-CM | POA: Diagnosis not present

## 2019-06-30 DIAGNOSIS — N2581 Secondary hyperparathyroidism of renal origin: Secondary | ICD-10-CM | POA: Diagnosis not present

## 2019-06-30 DIAGNOSIS — R739 Hyperglycemia, unspecified: Secondary | ICD-10-CM | POA: Diagnosis not present

## 2019-06-30 DIAGNOSIS — N186 End stage renal disease: Secondary | ICD-10-CM | POA: Diagnosis not present

## 2019-06-30 DIAGNOSIS — D631 Anemia in chronic kidney disease: Secondary | ICD-10-CM | POA: Diagnosis not present

## 2019-07-03 DIAGNOSIS — N2581 Secondary hyperparathyroidism of renal origin: Secondary | ICD-10-CM | POA: Diagnosis not present

## 2019-07-03 DIAGNOSIS — N186 End stage renal disease: Secondary | ICD-10-CM | POA: Diagnosis not present

## 2019-07-03 DIAGNOSIS — D631 Anemia in chronic kidney disease: Secondary | ICD-10-CM | POA: Diagnosis not present

## 2019-07-03 DIAGNOSIS — R739 Hyperglycemia, unspecified: Secondary | ICD-10-CM | POA: Diagnosis not present

## 2019-07-05 DIAGNOSIS — D631 Anemia in chronic kidney disease: Secondary | ICD-10-CM | POA: Diagnosis not present

## 2019-07-05 DIAGNOSIS — R739 Hyperglycemia, unspecified: Secondary | ICD-10-CM | POA: Diagnosis not present

## 2019-07-05 DIAGNOSIS — N2581 Secondary hyperparathyroidism of renal origin: Secondary | ICD-10-CM | POA: Diagnosis not present

## 2019-07-05 DIAGNOSIS — N186 End stage renal disease: Secondary | ICD-10-CM | POA: Diagnosis not present

## 2019-07-07 DIAGNOSIS — N2581 Secondary hyperparathyroidism of renal origin: Secondary | ICD-10-CM | POA: Diagnosis not present

## 2019-07-07 DIAGNOSIS — D631 Anemia in chronic kidney disease: Secondary | ICD-10-CM | POA: Diagnosis not present

## 2019-07-07 DIAGNOSIS — R739 Hyperglycemia, unspecified: Secondary | ICD-10-CM | POA: Diagnosis not present

## 2019-07-07 DIAGNOSIS — N186 End stage renal disease: Secondary | ICD-10-CM | POA: Diagnosis not present

## 2019-07-10 DIAGNOSIS — R739 Hyperglycemia, unspecified: Secondary | ICD-10-CM | POA: Diagnosis not present

## 2019-07-10 DIAGNOSIS — D631 Anemia in chronic kidney disease: Secondary | ICD-10-CM | POA: Diagnosis not present

## 2019-07-10 DIAGNOSIS — N2581 Secondary hyperparathyroidism of renal origin: Secondary | ICD-10-CM | POA: Diagnosis not present

## 2019-07-10 DIAGNOSIS — N186 End stage renal disease: Secondary | ICD-10-CM | POA: Diagnosis not present

## 2019-07-12 DIAGNOSIS — R739 Hyperglycemia, unspecified: Secondary | ICD-10-CM | POA: Diagnosis not present

## 2019-07-12 DIAGNOSIS — N2581 Secondary hyperparathyroidism of renal origin: Secondary | ICD-10-CM | POA: Diagnosis not present

## 2019-07-12 DIAGNOSIS — D631 Anemia in chronic kidney disease: Secondary | ICD-10-CM | POA: Diagnosis not present

## 2019-07-12 DIAGNOSIS — N186 End stage renal disease: Secondary | ICD-10-CM | POA: Diagnosis not present

## 2019-07-14 DIAGNOSIS — N2581 Secondary hyperparathyroidism of renal origin: Secondary | ICD-10-CM | POA: Diagnosis not present

## 2019-07-14 DIAGNOSIS — R739 Hyperglycemia, unspecified: Secondary | ICD-10-CM | POA: Diagnosis not present

## 2019-07-14 DIAGNOSIS — N186 End stage renal disease: Secondary | ICD-10-CM | POA: Diagnosis not present

## 2019-07-14 DIAGNOSIS — D631 Anemia in chronic kidney disease: Secondary | ICD-10-CM | POA: Diagnosis not present

## 2019-07-15 DIAGNOSIS — T8610 Unspecified complication of kidney transplant: Secondary | ICD-10-CM | POA: Diagnosis not present

## 2019-07-15 DIAGNOSIS — Z992 Dependence on renal dialysis: Secondary | ICD-10-CM | POA: Diagnosis not present

## 2019-07-15 DIAGNOSIS — N186 End stage renal disease: Secondary | ICD-10-CM | POA: Diagnosis not present

## 2019-07-17 DIAGNOSIS — Z992 Dependence on renal dialysis: Secondary | ICD-10-CM | POA: Diagnosis not present

## 2019-07-17 DIAGNOSIS — D631 Anemia in chronic kidney disease: Secondary | ICD-10-CM | POA: Diagnosis not present

## 2019-07-17 DIAGNOSIS — R739 Hyperglycemia, unspecified: Secondary | ICD-10-CM | POA: Diagnosis not present

## 2019-07-17 DIAGNOSIS — N2581 Secondary hyperparathyroidism of renal origin: Secondary | ICD-10-CM | POA: Diagnosis not present

## 2019-07-17 DIAGNOSIS — N186 End stage renal disease: Secondary | ICD-10-CM | POA: Diagnosis not present

## 2019-07-19 DIAGNOSIS — N186 End stage renal disease: Secondary | ICD-10-CM | POA: Diagnosis not present

## 2019-07-19 DIAGNOSIS — R739 Hyperglycemia, unspecified: Secondary | ICD-10-CM | POA: Diagnosis not present

## 2019-07-19 DIAGNOSIS — D631 Anemia in chronic kidney disease: Secondary | ICD-10-CM | POA: Diagnosis not present

## 2019-07-19 DIAGNOSIS — N2581 Secondary hyperparathyroidism of renal origin: Secondary | ICD-10-CM | POA: Diagnosis not present

## 2019-07-19 DIAGNOSIS — Z992 Dependence on renal dialysis: Secondary | ICD-10-CM | POA: Diagnosis not present

## 2019-07-20 DIAGNOSIS — T82858A Stenosis of vascular prosthetic devices, implants and grafts, initial encounter: Secondary | ICD-10-CM | POA: Diagnosis not present

## 2019-07-20 DIAGNOSIS — Z992 Dependence on renal dialysis: Secondary | ICD-10-CM | POA: Diagnosis not present

## 2019-07-20 DIAGNOSIS — N186 End stage renal disease: Secondary | ICD-10-CM | POA: Diagnosis not present

## 2019-07-20 DIAGNOSIS — I871 Compression of vein: Secondary | ICD-10-CM | POA: Diagnosis not present

## 2019-07-21 DIAGNOSIS — N2581 Secondary hyperparathyroidism of renal origin: Secondary | ICD-10-CM | POA: Diagnosis not present

## 2019-07-21 DIAGNOSIS — R739 Hyperglycemia, unspecified: Secondary | ICD-10-CM | POA: Diagnosis not present

## 2019-07-21 DIAGNOSIS — D631 Anemia in chronic kidney disease: Secondary | ICD-10-CM | POA: Diagnosis not present

## 2019-07-21 DIAGNOSIS — Z992 Dependence on renal dialysis: Secondary | ICD-10-CM | POA: Diagnosis not present

## 2019-07-21 DIAGNOSIS — N186 End stage renal disease: Secondary | ICD-10-CM | POA: Diagnosis not present

## 2019-07-24 DIAGNOSIS — D631 Anemia in chronic kidney disease: Secondary | ICD-10-CM | POA: Diagnosis not present

## 2019-07-24 DIAGNOSIS — R739 Hyperglycemia, unspecified: Secondary | ICD-10-CM | POA: Diagnosis not present

## 2019-07-24 DIAGNOSIS — Z992 Dependence on renal dialysis: Secondary | ICD-10-CM | POA: Diagnosis not present

## 2019-07-24 DIAGNOSIS — N186 End stage renal disease: Secondary | ICD-10-CM | POA: Diagnosis not present

## 2019-07-24 DIAGNOSIS — N2581 Secondary hyperparathyroidism of renal origin: Secondary | ICD-10-CM | POA: Diagnosis not present

## 2019-07-26 DIAGNOSIS — N2581 Secondary hyperparathyroidism of renal origin: Secondary | ICD-10-CM | POA: Diagnosis not present

## 2019-07-26 DIAGNOSIS — R739 Hyperglycemia, unspecified: Secondary | ICD-10-CM | POA: Diagnosis not present

## 2019-07-26 DIAGNOSIS — Z992 Dependence on renal dialysis: Secondary | ICD-10-CM | POA: Diagnosis not present

## 2019-07-26 DIAGNOSIS — N186 End stage renal disease: Secondary | ICD-10-CM | POA: Diagnosis not present

## 2019-07-26 DIAGNOSIS — D631 Anemia in chronic kidney disease: Secondary | ICD-10-CM | POA: Diagnosis not present

## 2019-07-28 DIAGNOSIS — D631 Anemia in chronic kidney disease: Secondary | ICD-10-CM | POA: Diagnosis not present

## 2019-07-28 DIAGNOSIS — N186 End stage renal disease: Secondary | ICD-10-CM | POA: Diagnosis not present

## 2019-07-28 DIAGNOSIS — N2581 Secondary hyperparathyroidism of renal origin: Secondary | ICD-10-CM | POA: Diagnosis not present

## 2019-07-28 DIAGNOSIS — R739 Hyperglycemia, unspecified: Secondary | ICD-10-CM | POA: Diagnosis not present

## 2019-07-28 DIAGNOSIS — Z992 Dependence on renal dialysis: Secondary | ICD-10-CM | POA: Diagnosis not present

## 2019-07-31 DIAGNOSIS — N2581 Secondary hyperparathyroidism of renal origin: Secondary | ICD-10-CM | POA: Diagnosis not present

## 2019-07-31 DIAGNOSIS — R739 Hyperglycemia, unspecified: Secondary | ICD-10-CM | POA: Diagnosis not present

## 2019-07-31 DIAGNOSIS — D631 Anemia in chronic kidney disease: Secondary | ICD-10-CM | POA: Diagnosis not present

## 2019-07-31 DIAGNOSIS — Z992 Dependence on renal dialysis: Secondary | ICD-10-CM | POA: Diagnosis not present

## 2019-07-31 DIAGNOSIS — N186 End stage renal disease: Secondary | ICD-10-CM | POA: Diagnosis not present

## 2019-08-02 DIAGNOSIS — N2581 Secondary hyperparathyroidism of renal origin: Secondary | ICD-10-CM | POA: Diagnosis not present

## 2019-08-02 DIAGNOSIS — R739 Hyperglycemia, unspecified: Secondary | ICD-10-CM | POA: Diagnosis not present

## 2019-08-02 DIAGNOSIS — Z992 Dependence on renal dialysis: Secondary | ICD-10-CM | POA: Diagnosis not present

## 2019-08-02 DIAGNOSIS — N186 End stage renal disease: Secondary | ICD-10-CM | POA: Diagnosis not present

## 2019-08-02 DIAGNOSIS — D631 Anemia in chronic kidney disease: Secondary | ICD-10-CM | POA: Diagnosis not present

## 2019-08-04 DIAGNOSIS — N2581 Secondary hyperparathyroidism of renal origin: Secondary | ICD-10-CM | POA: Diagnosis not present

## 2019-08-04 DIAGNOSIS — Z992 Dependence on renal dialysis: Secondary | ICD-10-CM | POA: Diagnosis not present

## 2019-08-04 DIAGNOSIS — R739 Hyperglycemia, unspecified: Secondary | ICD-10-CM | POA: Diagnosis not present

## 2019-08-04 DIAGNOSIS — N186 End stage renal disease: Secondary | ICD-10-CM | POA: Diagnosis not present

## 2019-08-04 DIAGNOSIS — D631 Anemia in chronic kidney disease: Secondary | ICD-10-CM | POA: Diagnosis not present

## 2019-08-07 DIAGNOSIS — R739 Hyperglycemia, unspecified: Secondary | ICD-10-CM | POA: Diagnosis not present

## 2019-08-07 DIAGNOSIS — N2581 Secondary hyperparathyroidism of renal origin: Secondary | ICD-10-CM | POA: Diagnosis not present

## 2019-08-07 DIAGNOSIS — N186 End stage renal disease: Secondary | ICD-10-CM | POA: Diagnosis not present

## 2019-08-07 DIAGNOSIS — D631 Anemia in chronic kidney disease: Secondary | ICD-10-CM | POA: Diagnosis not present

## 2019-08-07 DIAGNOSIS — Z992 Dependence on renal dialysis: Secondary | ICD-10-CM | POA: Diagnosis not present

## 2019-08-09 DIAGNOSIS — N186 End stage renal disease: Secondary | ICD-10-CM | POA: Diagnosis not present

## 2019-08-09 DIAGNOSIS — Z992 Dependence on renal dialysis: Secondary | ICD-10-CM | POA: Diagnosis not present

## 2019-08-09 DIAGNOSIS — N2581 Secondary hyperparathyroidism of renal origin: Secondary | ICD-10-CM | POA: Diagnosis not present

## 2019-08-09 DIAGNOSIS — R739 Hyperglycemia, unspecified: Secondary | ICD-10-CM | POA: Diagnosis not present

## 2019-08-09 DIAGNOSIS — D631 Anemia in chronic kidney disease: Secondary | ICD-10-CM | POA: Diagnosis not present

## 2019-08-11 DIAGNOSIS — D631 Anemia in chronic kidney disease: Secondary | ICD-10-CM | POA: Diagnosis not present

## 2019-08-11 DIAGNOSIS — R739 Hyperglycemia, unspecified: Secondary | ICD-10-CM | POA: Diagnosis not present

## 2019-08-11 DIAGNOSIS — Z992 Dependence on renal dialysis: Secondary | ICD-10-CM | POA: Diagnosis not present

## 2019-08-11 DIAGNOSIS — N2581 Secondary hyperparathyroidism of renal origin: Secondary | ICD-10-CM | POA: Diagnosis not present

## 2019-08-11 DIAGNOSIS — N186 End stage renal disease: Secondary | ICD-10-CM | POA: Diagnosis not present

## 2019-08-14 DIAGNOSIS — Z992 Dependence on renal dialysis: Secondary | ICD-10-CM | POA: Diagnosis not present

## 2019-08-14 DIAGNOSIS — D631 Anemia in chronic kidney disease: Secondary | ICD-10-CM | POA: Diagnosis not present

## 2019-08-14 DIAGNOSIS — N186 End stage renal disease: Secondary | ICD-10-CM | POA: Diagnosis not present

## 2019-08-14 DIAGNOSIS — R739 Hyperglycemia, unspecified: Secondary | ICD-10-CM | POA: Diagnosis not present

## 2019-08-14 DIAGNOSIS — N2581 Secondary hyperparathyroidism of renal origin: Secondary | ICD-10-CM | POA: Diagnosis not present

## 2019-08-15 DIAGNOSIS — T8610 Unspecified complication of kidney transplant: Secondary | ICD-10-CM | POA: Diagnosis not present

## 2019-08-15 DIAGNOSIS — N186 End stage renal disease: Secondary | ICD-10-CM | POA: Diagnosis not present

## 2019-08-15 DIAGNOSIS — Z992 Dependence on renal dialysis: Secondary | ICD-10-CM | POA: Diagnosis not present

## 2019-08-16 DIAGNOSIS — R739 Hyperglycemia, unspecified: Secondary | ICD-10-CM | POA: Diagnosis not present

## 2019-08-16 DIAGNOSIS — Z992 Dependence on renal dialysis: Secondary | ICD-10-CM | POA: Diagnosis not present

## 2019-08-16 DIAGNOSIS — Z23 Encounter for immunization: Secondary | ICD-10-CM | POA: Diagnosis not present

## 2019-08-16 DIAGNOSIS — D631 Anemia in chronic kidney disease: Secondary | ICD-10-CM | POA: Diagnosis not present

## 2019-08-16 DIAGNOSIS — N186 End stage renal disease: Secondary | ICD-10-CM | POA: Diagnosis not present

## 2019-08-16 DIAGNOSIS — N2581 Secondary hyperparathyroidism of renal origin: Secondary | ICD-10-CM | POA: Diagnosis not present

## 2019-08-18 DIAGNOSIS — Z992 Dependence on renal dialysis: Secondary | ICD-10-CM | POA: Diagnosis not present

## 2019-08-18 DIAGNOSIS — N2581 Secondary hyperparathyroidism of renal origin: Secondary | ICD-10-CM | POA: Diagnosis not present

## 2019-08-18 DIAGNOSIS — N186 End stage renal disease: Secondary | ICD-10-CM | POA: Diagnosis not present

## 2019-08-18 DIAGNOSIS — D631 Anemia in chronic kidney disease: Secondary | ICD-10-CM | POA: Diagnosis not present

## 2019-08-18 DIAGNOSIS — R739 Hyperglycemia, unspecified: Secondary | ICD-10-CM | POA: Diagnosis not present

## 2019-08-18 DIAGNOSIS — Z23 Encounter for immunization: Secondary | ICD-10-CM | POA: Diagnosis not present

## 2019-08-21 DIAGNOSIS — R739 Hyperglycemia, unspecified: Secondary | ICD-10-CM | POA: Diagnosis not present

## 2019-08-21 DIAGNOSIS — Z992 Dependence on renal dialysis: Secondary | ICD-10-CM | POA: Diagnosis not present

## 2019-08-21 DIAGNOSIS — N186 End stage renal disease: Secondary | ICD-10-CM | POA: Diagnosis not present

## 2019-08-21 DIAGNOSIS — Z23 Encounter for immunization: Secondary | ICD-10-CM | POA: Diagnosis not present

## 2019-08-21 DIAGNOSIS — N2581 Secondary hyperparathyroidism of renal origin: Secondary | ICD-10-CM | POA: Diagnosis not present

## 2019-08-21 DIAGNOSIS — D631 Anemia in chronic kidney disease: Secondary | ICD-10-CM | POA: Diagnosis not present

## 2019-08-23 DIAGNOSIS — N186 End stage renal disease: Secondary | ICD-10-CM | POA: Diagnosis not present

## 2019-08-23 DIAGNOSIS — R739 Hyperglycemia, unspecified: Secondary | ICD-10-CM | POA: Diagnosis not present

## 2019-08-23 DIAGNOSIS — Z23 Encounter for immunization: Secondary | ICD-10-CM | POA: Diagnosis not present

## 2019-08-23 DIAGNOSIS — N2581 Secondary hyperparathyroidism of renal origin: Secondary | ICD-10-CM | POA: Diagnosis not present

## 2019-08-23 DIAGNOSIS — D631 Anemia in chronic kidney disease: Secondary | ICD-10-CM | POA: Diagnosis not present

## 2019-08-23 DIAGNOSIS — Z992 Dependence on renal dialysis: Secondary | ICD-10-CM | POA: Diagnosis not present

## 2019-08-25 DIAGNOSIS — R739 Hyperglycemia, unspecified: Secondary | ICD-10-CM | POA: Diagnosis not present

## 2019-08-25 DIAGNOSIS — Z992 Dependence on renal dialysis: Secondary | ICD-10-CM | POA: Diagnosis not present

## 2019-08-25 DIAGNOSIS — N186 End stage renal disease: Secondary | ICD-10-CM | POA: Diagnosis not present

## 2019-08-25 DIAGNOSIS — Z23 Encounter for immunization: Secondary | ICD-10-CM | POA: Diagnosis not present

## 2019-08-25 DIAGNOSIS — D631 Anemia in chronic kidney disease: Secondary | ICD-10-CM | POA: Diagnosis not present

## 2019-08-25 DIAGNOSIS — N2581 Secondary hyperparathyroidism of renal origin: Secondary | ICD-10-CM | POA: Diagnosis not present

## 2019-08-28 DIAGNOSIS — R739 Hyperglycemia, unspecified: Secondary | ICD-10-CM | POA: Diagnosis not present

## 2019-08-28 DIAGNOSIS — N186 End stage renal disease: Secondary | ICD-10-CM | POA: Diagnosis not present

## 2019-08-28 DIAGNOSIS — N2581 Secondary hyperparathyroidism of renal origin: Secondary | ICD-10-CM | POA: Diagnosis not present

## 2019-08-28 DIAGNOSIS — Z992 Dependence on renal dialysis: Secondary | ICD-10-CM | POA: Diagnosis not present

## 2019-08-28 DIAGNOSIS — Z23 Encounter for immunization: Secondary | ICD-10-CM | POA: Diagnosis not present

## 2019-08-28 DIAGNOSIS — D631 Anemia in chronic kidney disease: Secondary | ICD-10-CM | POA: Diagnosis not present

## 2019-08-30 DIAGNOSIS — Z23 Encounter for immunization: Secondary | ICD-10-CM | POA: Diagnosis not present

## 2019-08-30 DIAGNOSIS — D631 Anemia in chronic kidney disease: Secondary | ICD-10-CM | POA: Diagnosis not present

## 2019-08-30 DIAGNOSIS — N2581 Secondary hyperparathyroidism of renal origin: Secondary | ICD-10-CM | POA: Diagnosis not present

## 2019-08-30 DIAGNOSIS — N186 End stage renal disease: Secondary | ICD-10-CM | POA: Diagnosis not present

## 2019-08-30 DIAGNOSIS — Z992 Dependence on renal dialysis: Secondary | ICD-10-CM | POA: Diagnosis not present

## 2019-08-30 DIAGNOSIS — R739 Hyperglycemia, unspecified: Secondary | ICD-10-CM | POA: Diagnosis not present

## 2019-09-01 DIAGNOSIS — R739 Hyperglycemia, unspecified: Secondary | ICD-10-CM | POA: Diagnosis not present

## 2019-09-01 DIAGNOSIS — N186 End stage renal disease: Secondary | ICD-10-CM | POA: Diagnosis not present

## 2019-09-01 DIAGNOSIS — N2581 Secondary hyperparathyroidism of renal origin: Secondary | ICD-10-CM | POA: Diagnosis not present

## 2019-09-01 DIAGNOSIS — Z992 Dependence on renal dialysis: Secondary | ICD-10-CM | POA: Diagnosis not present

## 2019-09-01 DIAGNOSIS — D631 Anemia in chronic kidney disease: Secondary | ICD-10-CM | POA: Diagnosis not present

## 2019-09-01 DIAGNOSIS — Z23 Encounter for immunization: Secondary | ICD-10-CM | POA: Diagnosis not present

## 2019-09-04 DIAGNOSIS — N186 End stage renal disease: Secondary | ICD-10-CM | POA: Diagnosis not present

## 2019-09-04 DIAGNOSIS — Z992 Dependence on renal dialysis: Secondary | ICD-10-CM | POA: Diagnosis not present

## 2019-09-04 DIAGNOSIS — Z23 Encounter for immunization: Secondary | ICD-10-CM | POA: Diagnosis not present

## 2019-09-04 DIAGNOSIS — R739 Hyperglycemia, unspecified: Secondary | ICD-10-CM | POA: Diagnosis not present

## 2019-09-04 DIAGNOSIS — D631 Anemia in chronic kidney disease: Secondary | ICD-10-CM | POA: Diagnosis not present

## 2019-09-04 DIAGNOSIS — N2581 Secondary hyperparathyroidism of renal origin: Secondary | ICD-10-CM | POA: Diagnosis not present

## 2019-09-06 DIAGNOSIS — N2581 Secondary hyperparathyroidism of renal origin: Secondary | ICD-10-CM | POA: Diagnosis not present

## 2019-09-06 DIAGNOSIS — N186 End stage renal disease: Secondary | ICD-10-CM | POA: Diagnosis not present

## 2019-09-06 DIAGNOSIS — Z23 Encounter for immunization: Secondary | ICD-10-CM | POA: Diagnosis not present

## 2019-09-06 DIAGNOSIS — D631 Anemia in chronic kidney disease: Secondary | ICD-10-CM | POA: Diagnosis not present

## 2019-09-06 DIAGNOSIS — R739 Hyperglycemia, unspecified: Secondary | ICD-10-CM | POA: Diagnosis not present

## 2019-09-06 DIAGNOSIS — Z992 Dependence on renal dialysis: Secondary | ICD-10-CM | POA: Diagnosis not present

## 2019-09-08 DIAGNOSIS — Z23 Encounter for immunization: Secondary | ICD-10-CM | POA: Diagnosis not present

## 2019-09-08 DIAGNOSIS — N2581 Secondary hyperparathyroidism of renal origin: Secondary | ICD-10-CM | POA: Diagnosis not present

## 2019-09-08 DIAGNOSIS — Z992 Dependence on renal dialysis: Secondary | ICD-10-CM | POA: Diagnosis not present

## 2019-09-08 DIAGNOSIS — R739 Hyperglycemia, unspecified: Secondary | ICD-10-CM | POA: Diagnosis not present

## 2019-09-08 DIAGNOSIS — N186 End stage renal disease: Secondary | ICD-10-CM | POA: Diagnosis not present

## 2019-09-08 DIAGNOSIS — D631 Anemia in chronic kidney disease: Secondary | ICD-10-CM | POA: Diagnosis not present

## 2019-09-11 DIAGNOSIS — Z23 Encounter for immunization: Secondary | ICD-10-CM | POA: Diagnosis not present

## 2019-09-11 DIAGNOSIS — N186 End stage renal disease: Secondary | ICD-10-CM | POA: Diagnosis not present

## 2019-09-11 DIAGNOSIS — N2581 Secondary hyperparathyroidism of renal origin: Secondary | ICD-10-CM | POA: Diagnosis not present

## 2019-09-11 DIAGNOSIS — R739 Hyperglycemia, unspecified: Secondary | ICD-10-CM | POA: Diagnosis not present

## 2019-09-11 DIAGNOSIS — Z992 Dependence on renal dialysis: Secondary | ICD-10-CM | POA: Diagnosis not present

## 2019-09-11 DIAGNOSIS — D631 Anemia in chronic kidney disease: Secondary | ICD-10-CM | POA: Diagnosis not present

## 2019-09-13 DIAGNOSIS — Z23 Encounter for immunization: Secondary | ICD-10-CM | POA: Diagnosis not present

## 2019-09-13 DIAGNOSIS — N186 End stage renal disease: Secondary | ICD-10-CM | POA: Diagnosis not present

## 2019-09-13 DIAGNOSIS — N2581 Secondary hyperparathyroidism of renal origin: Secondary | ICD-10-CM | POA: Diagnosis not present

## 2019-09-13 DIAGNOSIS — Z992 Dependence on renal dialysis: Secondary | ICD-10-CM | POA: Diagnosis not present

## 2019-09-13 DIAGNOSIS — R739 Hyperglycemia, unspecified: Secondary | ICD-10-CM | POA: Diagnosis not present

## 2019-09-13 DIAGNOSIS — D631 Anemia in chronic kidney disease: Secondary | ICD-10-CM | POA: Diagnosis not present

## 2019-09-14 DIAGNOSIS — Z992 Dependence on renal dialysis: Secondary | ICD-10-CM | POA: Diagnosis not present

## 2019-09-14 DIAGNOSIS — N186 End stage renal disease: Secondary | ICD-10-CM | POA: Diagnosis not present

## 2019-09-14 DIAGNOSIS — T8610 Unspecified complication of kidney transplant: Secondary | ICD-10-CM | POA: Diagnosis not present

## 2019-09-15 DIAGNOSIS — Z992 Dependence on renal dialysis: Secondary | ICD-10-CM | POA: Diagnosis not present

## 2019-09-15 DIAGNOSIS — D631 Anemia in chronic kidney disease: Secondary | ICD-10-CM | POA: Diagnosis not present

## 2019-09-15 DIAGNOSIS — N2581 Secondary hyperparathyroidism of renal origin: Secondary | ICD-10-CM | POA: Diagnosis not present

## 2019-09-15 DIAGNOSIS — N186 End stage renal disease: Secondary | ICD-10-CM | POA: Diagnosis not present

## 2019-09-18 DIAGNOSIS — N186 End stage renal disease: Secondary | ICD-10-CM | POA: Diagnosis not present

## 2019-09-18 DIAGNOSIS — N2581 Secondary hyperparathyroidism of renal origin: Secondary | ICD-10-CM | POA: Diagnosis not present

## 2019-09-18 DIAGNOSIS — D631 Anemia in chronic kidney disease: Secondary | ICD-10-CM | POA: Diagnosis not present

## 2019-09-18 DIAGNOSIS — Z992 Dependence on renal dialysis: Secondary | ICD-10-CM | POA: Diagnosis not present

## 2019-09-20 DIAGNOSIS — N2581 Secondary hyperparathyroidism of renal origin: Secondary | ICD-10-CM | POA: Diagnosis not present

## 2019-09-20 DIAGNOSIS — N186 End stage renal disease: Secondary | ICD-10-CM | POA: Diagnosis not present

## 2019-09-20 DIAGNOSIS — D631 Anemia in chronic kidney disease: Secondary | ICD-10-CM | POA: Diagnosis not present

## 2019-09-20 DIAGNOSIS — Z992 Dependence on renal dialysis: Secondary | ICD-10-CM | POA: Diagnosis not present

## 2019-09-22 DIAGNOSIS — N186 End stage renal disease: Secondary | ICD-10-CM | POA: Diagnosis not present

## 2019-09-22 DIAGNOSIS — D631 Anemia in chronic kidney disease: Secondary | ICD-10-CM | POA: Diagnosis not present

## 2019-09-22 DIAGNOSIS — Z992 Dependence on renal dialysis: Secondary | ICD-10-CM | POA: Diagnosis not present

## 2019-09-22 DIAGNOSIS — N2581 Secondary hyperparathyroidism of renal origin: Secondary | ICD-10-CM | POA: Diagnosis not present

## 2019-09-25 DIAGNOSIS — N2581 Secondary hyperparathyroidism of renal origin: Secondary | ICD-10-CM | POA: Diagnosis not present

## 2019-09-25 DIAGNOSIS — N186 End stage renal disease: Secondary | ICD-10-CM | POA: Diagnosis not present

## 2019-09-25 DIAGNOSIS — D631 Anemia in chronic kidney disease: Secondary | ICD-10-CM | POA: Diagnosis not present

## 2019-09-25 DIAGNOSIS — Z992 Dependence on renal dialysis: Secondary | ICD-10-CM | POA: Diagnosis not present

## 2019-09-27 DIAGNOSIS — Z992 Dependence on renal dialysis: Secondary | ICD-10-CM | POA: Diagnosis not present

## 2019-09-27 DIAGNOSIS — D631 Anemia in chronic kidney disease: Secondary | ICD-10-CM | POA: Diagnosis not present

## 2019-09-27 DIAGNOSIS — N186 End stage renal disease: Secondary | ICD-10-CM | POA: Diagnosis not present

## 2019-09-27 DIAGNOSIS — N2581 Secondary hyperparathyroidism of renal origin: Secondary | ICD-10-CM | POA: Diagnosis not present

## 2019-09-29 DIAGNOSIS — D631 Anemia in chronic kidney disease: Secondary | ICD-10-CM | POA: Diagnosis not present

## 2019-09-29 DIAGNOSIS — N186 End stage renal disease: Secondary | ICD-10-CM | POA: Diagnosis not present

## 2019-09-29 DIAGNOSIS — N2581 Secondary hyperparathyroidism of renal origin: Secondary | ICD-10-CM | POA: Diagnosis not present

## 2019-09-29 DIAGNOSIS — Z992 Dependence on renal dialysis: Secondary | ICD-10-CM | POA: Diagnosis not present

## 2019-10-02 DIAGNOSIS — N2581 Secondary hyperparathyroidism of renal origin: Secondary | ICD-10-CM | POA: Diagnosis not present

## 2019-10-02 DIAGNOSIS — Z992 Dependence on renal dialysis: Secondary | ICD-10-CM | POA: Diagnosis not present

## 2019-10-02 DIAGNOSIS — D631 Anemia in chronic kidney disease: Secondary | ICD-10-CM | POA: Diagnosis not present

## 2019-10-02 DIAGNOSIS — N186 End stage renal disease: Secondary | ICD-10-CM | POA: Diagnosis not present

## 2019-10-04 DIAGNOSIS — D631 Anemia in chronic kidney disease: Secondary | ICD-10-CM | POA: Diagnosis not present

## 2019-10-04 DIAGNOSIS — N2581 Secondary hyperparathyroidism of renal origin: Secondary | ICD-10-CM | POA: Diagnosis not present

## 2019-10-04 DIAGNOSIS — N186 End stage renal disease: Secondary | ICD-10-CM | POA: Diagnosis not present

## 2019-10-04 DIAGNOSIS — Z992 Dependence on renal dialysis: Secondary | ICD-10-CM | POA: Diagnosis not present

## 2019-10-06 DIAGNOSIS — D631 Anemia in chronic kidney disease: Secondary | ICD-10-CM | POA: Diagnosis not present

## 2019-10-06 DIAGNOSIS — Z992 Dependence on renal dialysis: Secondary | ICD-10-CM | POA: Diagnosis not present

## 2019-10-06 DIAGNOSIS — N2581 Secondary hyperparathyroidism of renal origin: Secondary | ICD-10-CM | POA: Diagnosis not present

## 2019-10-06 DIAGNOSIS — N186 End stage renal disease: Secondary | ICD-10-CM | POA: Diagnosis not present

## 2019-10-09 DIAGNOSIS — N2581 Secondary hyperparathyroidism of renal origin: Secondary | ICD-10-CM | POA: Diagnosis not present

## 2019-10-09 DIAGNOSIS — D631 Anemia in chronic kidney disease: Secondary | ICD-10-CM | POA: Diagnosis not present

## 2019-10-09 DIAGNOSIS — Z992 Dependence on renal dialysis: Secondary | ICD-10-CM | POA: Diagnosis not present

## 2019-10-09 DIAGNOSIS — N186 End stage renal disease: Secondary | ICD-10-CM | POA: Diagnosis not present

## 2019-10-11 DIAGNOSIS — D631 Anemia in chronic kidney disease: Secondary | ICD-10-CM | POA: Diagnosis not present

## 2019-10-11 DIAGNOSIS — Z992 Dependence on renal dialysis: Secondary | ICD-10-CM | POA: Diagnosis not present

## 2019-10-11 DIAGNOSIS — N2581 Secondary hyperparathyroidism of renal origin: Secondary | ICD-10-CM | POA: Diagnosis not present

## 2019-10-11 DIAGNOSIS — N186 End stage renal disease: Secondary | ICD-10-CM | POA: Diagnosis not present

## 2019-10-13 DIAGNOSIS — D631 Anemia in chronic kidney disease: Secondary | ICD-10-CM | POA: Diagnosis not present

## 2019-10-13 DIAGNOSIS — Z992 Dependence on renal dialysis: Secondary | ICD-10-CM | POA: Diagnosis not present

## 2019-10-13 DIAGNOSIS — N2581 Secondary hyperparathyroidism of renal origin: Secondary | ICD-10-CM | POA: Diagnosis not present

## 2019-10-13 DIAGNOSIS — N186 End stage renal disease: Secondary | ICD-10-CM | POA: Diagnosis not present

## 2019-10-15 DIAGNOSIS — T8610 Unspecified complication of kidney transplant: Secondary | ICD-10-CM | POA: Diagnosis not present

## 2019-10-15 DIAGNOSIS — Z992 Dependence on renal dialysis: Secondary | ICD-10-CM | POA: Diagnosis not present

## 2019-10-15 DIAGNOSIS — N186 End stage renal disease: Secondary | ICD-10-CM | POA: Diagnosis not present

## 2019-10-16 DIAGNOSIS — Z992 Dependence on renal dialysis: Secondary | ICD-10-CM | POA: Diagnosis not present

## 2019-10-16 DIAGNOSIS — N186 End stage renal disease: Secondary | ICD-10-CM | POA: Diagnosis not present

## 2019-10-16 DIAGNOSIS — D631 Anemia in chronic kidney disease: Secondary | ICD-10-CM | POA: Diagnosis not present

## 2019-10-16 DIAGNOSIS — R739 Hyperglycemia, unspecified: Secondary | ICD-10-CM | POA: Diagnosis not present

## 2019-10-16 DIAGNOSIS — N2581 Secondary hyperparathyroidism of renal origin: Secondary | ICD-10-CM | POA: Diagnosis not present

## 2019-10-18 DIAGNOSIS — N186 End stage renal disease: Secondary | ICD-10-CM | POA: Diagnosis not present

## 2019-10-18 DIAGNOSIS — Z992 Dependence on renal dialysis: Secondary | ICD-10-CM | POA: Diagnosis not present

## 2019-10-18 DIAGNOSIS — D631 Anemia in chronic kidney disease: Secondary | ICD-10-CM | POA: Diagnosis not present

## 2019-10-18 DIAGNOSIS — N2581 Secondary hyperparathyroidism of renal origin: Secondary | ICD-10-CM | POA: Diagnosis not present

## 2019-10-18 DIAGNOSIS — R739 Hyperglycemia, unspecified: Secondary | ICD-10-CM | POA: Diagnosis not present

## 2019-10-20 DIAGNOSIS — N186 End stage renal disease: Secondary | ICD-10-CM | POA: Diagnosis not present

## 2019-10-20 DIAGNOSIS — N2581 Secondary hyperparathyroidism of renal origin: Secondary | ICD-10-CM | POA: Diagnosis not present

## 2019-10-20 DIAGNOSIS — R739 Hyperglycemia, unspecified: Secondary | ICD-10-CM | POA: Diagnosis not present

## 2019-10-20 DIAGNOSIS — Z992 Dependence on renal dialysis: Secondary | ICD-10-CM | POA: Diagnosis not present

## 2019-10-20 DIAGNOSIS — D631 Anemia in chronic kidney disease: Secondary | ICD-10-CM | POA: Diagnosis not present

## 2019-10-23 DIAGNOSIS — R739 Hyperglycemia, unspecified: Secondary | ICD-10-CM | POA: Diagnosis not present

## 2019-10-23 DIAGNOSIS — N2581 Secondary hyperparathyroidism of renal origin: Secondary | ICD-10-CM | POA: Diagnosis not present

## 2019-10-23 DIAGNOSIS — N186 End stage renal disease: Secondary | ICD-10-CM | POA: Diagnosis not present

## 2019-10-23 DIAGNOSIS — D631 Anemia in chronic kidney disease: Secondary | ICD-10-CM | POA: Diagnosis not present

## 2019-10-23 DIAGNOSIS — Z992 Dependence on renal dialysis: Secondary | ICD-10-CM | POA: Diagnosis not present

## 2019-10-25 DIAGNOSIS — N2581 Secondary hyperparathyroidism of renal origin: Secondary | ICD-10-CM | POA: Diagnosis not present

## 2019-10-25 DIAGNOSIS — D631 Anemia in chronic kidney disease: Secondary | ICD-10-CM | POA: Diagnosis not present

## 2019-10-25 DIAGNOSIS — R739 Hyperglycemia, unspecified: Secondary | ICD-10-CM | POA: Diagnosis not present

## 2019-10-25 DIAGNOSIS — Z992 Dependence on renal dialysis: Secondary | ICD-10-CM | POA: Diagnosis not present

## 2019-10-25 DIAGNOSIS — N186 End stage renal disease: Secondary | ICD-10-CM | POA: Diagnosis not present

## 2019-10-27 DIAGNOSIS — D631 Anemia in chronic kidney disease: Secondary | ICD-10-CM | POA: Diagnosis not present

## 2019-10-27 DIAGNOSIS — N186 End stage renal disease: Secondary | ICD-10-CM | POA: Diagnosis not present

## 2019-10-27 DIAGNOSIS — R739 Hyperglycemia, unspecified: Secondary | ICD-10-CM | POA: Diagnosis not present

## 2019-10-27 DIAGNOSIS — N2581 Secondary hyperparathyroidism of renal origin: Secondary | ICD-10-CM | POA: Diagnosis not present

## 2019-10-27 DIAGNOSIS — Z992 Dependence on renal dialysis: Secondary | ICD-10-CM | POA: Diagnosis not present

## 2019-10-30 DIAGNOSIS — Z992 Dependence on renal dialysis: Secondary | ICD-10-CM | POA: Diagnosis not present

## 2019-10-30 DIAGNOSIS — N186 End stage renal disease: Secondary | ICD-10-CM | POA: Diagnosis not present

## 2019-10-30 DIAGNOSIS — R739 Hyperglycemia, unspecified: Secondary | ICD-10-CM | POA: Diagnosis not present

## 2019-10-30 DIAGNOSIS — N2581 Secondary hyperparathyroidism of renal origin: Secondary | ICD-10-CM | POA: Diagnosis not present

## 2019-10-30 DIAGNOSIS — D631 Anemia in chronic kidney disease: Secondary | ICD-10-CM | POA: Diagnosis not present

## 2019-11-01 DIAGNOSIS — Z992 Dependence on renal dialysis: Secondary | ICD-10-CM | POA: Diagnosis not present

## 2019-11-01 DIAGNOSIS — N186 End stage renal disease: Secondary | ICD-10-CM | POA: Diagnosis not present

## 2019-11-01 DIAGNOSIS — N2581 Secondary hyperparathyroidism of renal origin: Secondary | ICD-10-CM | POA: Diagnosis not present

## 2019-11-01 DIAGNOSIS — R739 Hyperglycemia, unspecified: Secondary | ICD-10-CM | POA: Diagnosis not present

## 2019-11-01 DIAGNOSIS — D631 Anemia in chronic kidney disease: Secondary | ICD-10-CM | POA: Diagnosis not present

## 2019-11-03 DIAGNOSIS — R739 Hyperglycemia, unspecified: Secondary | ICD-10-CM | POA: Diagnosis not present

## 2019-11-03 DIAGNOSIS — D631 Anemia in chronic kidney disease: Secondary | ICD-10-CM | POA: Diagnosis not present

## 2019-11-03 DIAGNOSIS — N2581 Secondary hyperparathyroidism of renal origin: Secondary | ICD-10-CM | POA: Diagnosis not present

## 2019-11-03 DIAGNOSIS — N186 End stage renal disease: Secondary | ICD-10-CM | POA: Diagnosis not present

## 2019-11-03 DIAGNOSIS — Z992 Dependence on renal dialysis: Secondary | ICD-10-CM | POA: Diagnosis not present

## 2019-11-05 DIAGNOSIS — Z992 Dependence on renal dialysis: Secondary | ICD-10-CM | POA: Diagnosis not present

## 2019-11-05 DIAGNOSIS — N2581 Secondary hyperparathyroidism of renal origin: Secondary | ICD-10-CM | POA: Diagnosis not present

## 2019-11-05 DIAGNOSIS — D631 Anemia in chronic kidney disease: Secondary | ICD-10-CM | POA: Diagnosis not present

## 2019-11-05 DIAGNOSIS — N186 End stage renal disease: Secondary | ICD-10-CM | POA: Diagnosis not present

## 2019-11-05 DIAGNOSIS — R739 Hyperglycemia, unspecified: Secondary | ICD-10-CM | POA: Diagnosis not present

## 2019-11-07 DIAGNOSIS — N2581 Secondary hyperparathyroidism of renal origin: Secondary | ICD-10-CM | POA: Diagnosis not present

## 2019-11-07 DIAGNOSIS — Z992 Dependence on renal dialysis: Secondary | ICD-10-CM | POA: Diagnosis not present

## 2019-11-07 DIAGNOSIS — R739 Hyperglycemia, unspecified: Secondary | ICD-10-CM | POA: Diagnosis not present

## 2019-11-07 DIAGNOSIS — N186 End stage renal disease: Secondary | ICD-10-CM | POA: Diagnosis not present

## 2019-11-07 DIAGNOSIS — D631 Anemia in chronic kidney disease: Secondary | ICD-10-CM | POA: Diagnosis not present

## 2019-11-10 DIAGNOSIS — N186 End stage renal disease: Secondary | ICD-10-CM | POA: Diagnosis not present

## 2019-11-10 DIAGNOSIS — N2581 Secondary hyperparathyroidism of renal origin: Secondary | ICD-10-CM | POA: Diagnosis not present

## 2019-11-10 DIAGNOSIS — D631 Anemia in chronic kidney disease: Secondary | ICD-10-CM | POA: Diagnosis not present

## 2019-11-10 DIAGNOSIS — R739 Hyperglycemia, unspecified: Secondary | ICD-10-CM | POA: Diagnosis not present

## 2019-11-10 DIAGNOSIS — Z992 Dependence on renal dialysis: Secondary | ICD-10-CM | POA: Diagnosis not present

## 2019-11-13 DIAGNOSIS — R739 Hyperglycemia, unspecified: Secondary | ICD-10-CM | POA: Diagnosis not present

## 2019-11-13 DIAGNOSIS — N2581 Secondary hyperparathyroidism of renal origin: Secondary | ICD-10-CM | POA: Diagnosis not present

## 2019-11-13 DIAGNOSIS — Z992 Dependence on renal dialysis: Secondary | ICD-10-CM | POA: Diagnosis not present

## 2019-11-13 DIAGNOSIS — N186 End stage renal disease: Secondary | ICD-10-CM | POA: Diagnosis not present

## 2019-11-13 DIAGNOSIS — D631 Anemia in chronic kidney disease: Secondary | ICD-10-CM | POA: Diagnosis not present

## 2020-12-16 DIAGNOSIS — D689 Coagulation defect, unspecified: Secondary | ICD-10-CM | POA: Diagnosis not present

## 2020-12-16 DIAGNOSIS — D509 Iron deficiency anemia, unspecified: Secondary | ICD-10-CM | POA: Diagnosis not present

## 2020-12-16 DIAGNOSIS — L299 Pruritus, unspecified: Secondary | ICD-10-CM | POA: Diagnosis not present

## 2020-12-16 DIAGNOSIS — D631 Anemia in chronic kidney disease: Secondary | ICD-10-CM | POA: Diagnosis not present

## 2020-12-16 DIAGNOSIS — N186 End stage renal disease: Secondary | ICD-10-CM | POA: Diagnosis not present

## 2020-12-16 DIAGNOSIS — N2581 Secondary hyperparathyroidism of renal origin: Secondary | ICD-10-CM | POA: Diagnosis not present

## 2020-12-16 DIAGNOSIS — Z992 Dependence on renal dialysis: Secondary | ICD-10-CM | POA: Diagnosis not present

## 2020-12-18 DIAGNOSIS — N2581 Secondary hyperparathyroidism of renal origin: Secondary | ICD-10-CM | POA: Diagnosis not present

## 2020-12-18 DIAGNOSIS — D689 Coagulation defect, unspecified: Secondary | ICD-10-CM | POA: Diagnosis not present

## 2020-12-18 DIAGNOSIS — D631 Anemia in chronic kidney disease: Secondary | ICD-10-CM | POA: Diagnosis not present

## 2020-12-18 DIAGNOSIS — Z992 Dependence on renal dialysis: Secondary | ICD-10-CM | POA: Diagnosis not present

## 2020-12-18 DIAGNOSIS — D509 Iron deficiency anemia, unspecified: Secondary | ICD-10-CM | POA: Diagnosis not present

## 2020-12-18 DIAGNOSIS — L299 Pruritus, unspecified: Secondary | ICD-10-CM | POA: Diagnosis not present

## 2020-12-18 DIAGNOSIS — N186 End stage renal disease: Secondary | ICD-10-CM | POA: Diagnosis not present

## 2020-12-20 DIAGNOSIS — D689 Coagulation defect, unspecified: Secondary | ICD-10-CM | POA: Diagnosis not present

## 2020-12-20 DIAGNOSIS — N2581 Secondary hyperparathyroidism of renal origin: Secondary | ICD-10-CM | POA: Diagnosis not present

## 2020-12-20 DIAGNOSIS — L299 Pruritus, unspecified: Secondary | ICD-10-CM | POA: Diagnosis not present

## 2020-12-20 DIAGNOSIS — D509 Iron deficiency anemia, unspecified: Secondary | ICD-10-CM | POA: Diagnosis not present

## 2020-12-20 DIAGNOSIS — D631 Anemia in chronic kidney disease: Secondary | ICD-10-CM | POA: Diagnosis not present

## 2020-12-20 DIAGNOSIS — N186 End stage renal disease: Secondary | ICD-10-CM | POA: Diagnosis not present

## 2020-12-20 DIAGNOSIS — Z992 Dependence on renal dialysis: Secondary | ICD-10-CM | POA: Diagnosis not present

## 2020-12-23 DIAGNOSIS — Z992 Dependence on renal dialysis: Secondary | ICD-10-CM | POA: Diagnosis not present

## 2020-12-23 DIAGNOSIS — N2581 Secondary hyperparathyroidism of renal origin: Secondary | ICD-10-CM | POA: Diagnosis not present

## 2020-12-23 DIAGNOSIS — L299 Pruritus, unspecified: Secondary | ICD-10-CM | POA: Diagnosis not present

## 2020-12-23 DIAGNOSIS — D509 Iron deficiency anemia, unspecified: Secondary | ICD-10-CM | POA: Diagnosis not present

## 2020-12-23 DIAGNOSIS — D689 Coagulation defect, unspecified: Secondary | ICD-10-CM | POA: Diagnosis not present

## 2020-12-23 DIAGNOSIS — D631 Anemia in chronic kidney disease: Secondary | ICD-10-CM | POA: Diagnosis not present

## 2020-12-23 DIAGNOSIS — N186 End stage renal disease: Secondary | ICD-10-CM | POA: Diagnosis not present

## 2020-12-25 DIAGNOSIS — N186 End stage renal disease: Secondary | ICD-10-CM | POA: Diagnosis not present

## 2020-12-25 DIAGNOSIS — D631 Anemia in chronic kidney disease: Secondary | ICD-10-CM | POA: Diagnosis not present

## 2020-12-25 DIAGNOSIS — N2581 Secondary hyperparathyroidism of renal origin: Secondary | ICD-10-CM | POA: Diagnosis not present

## 2020-12-25 DIAGNOSIS — D509 Iron deficiency anemia, unspecified: Secondary | ICD-10-CM | POA: Diagnosis not present

## 2020-12-25 DIAGNOSIS — L299 Pruritus, unspecified: Secondary | ICD-10-CM | POA: Diagnosis not present

## 2020-12-25 DIAGNOSIS — D689 Coagulation defect, unspecified: Secondary | ICD-10-CM | POA: Diagnosis not present

## 2020-12-25 DIAGNOSIS — Z992 Dependence on renal dialysis: Secondary | ICD-10-CM | POA: Diagnosis not present

## 2020-12-27 DIAGNOSIS — N2581 Secondary hyperparathyroidism of renal origin: Secondary | ICD-10-CM | POA: Diagnosis not present

## 2020-12-27 DIAGNOSIS — D631 Anemia in chronic kidney disease: Secondary | ICD-10-CM | POA: Diagnosis not present

## 2020-12-27 DIAGNOSIS — Z992 Dependence on renal dialysis: Secondary | ICD-10-CM | POA: Diagnosis not present

## 2020-12-27 DIAGNOSIS — D509 Iron deficiency anemia, unspecified: Secondary | ICD-10-CM | POA: Diagnosis not present

## 2020-12-27 DIAGNOSIS — N186 End stage renal disease: Secondary | ICD-10-CM | POA: Diagnosis not present

## 2020-12-27 DIAGNOSIS — D689 Coagulation defect, unspecified: Secondary | ICD-10-CM | POA: Diagnosis not present

## 2020-12-27 DIAGNOSIS — L299 Pruritus, unspecified: Secondary | ICD-10-CM | POA: Diagnosis not present

## 2020-12-30 ENCOUNTER — Emergency Department (HOSPITAL_COMMUNITY)
Admission: EM | Admit: 2020-12-30 | Discharge: 2020-12-31 | Disposition: A | Discharge status: Home / Self Care | Payer: Medicare Other | Attending: Emergency Medicine | Admitting: Emergency Medicine

## 2020-12-30 ENCOUNTER — Other Ambulatory Visit: Payer: Self-pay

## 2020-12-30 ENCOUNTER — Encounter (HOSPITAL_COMMUNITY): Payer: Self-pay

## 2020-12-30 DIAGNOSIS — N186 End stage renal disease: Secondary | ICD-10-CM | POA: Insufficient documentation

## 2020-12-30 DIAGNOSIS — D509 Iron deficiency anemia, unspecified: Secondary | ICD-10-CM | POA: Diagnosis not present

## 2020-12-30 DIAGNOSIS — N2581 Secondary hyperparathyroidism of renal origin: Secondary | ICD-10-CM | POA: Diagnosis not present

## 2020-12-30 DIAGNOSIS — K6389 Other specified diseases of intestine: Secondary | ICD-10-CM | POA: Diagnosis not present

## 2020-12-30 DIAGNOSIS — K529 Noninfective gastroenteritis and colitis, unspecified: Secondary | ICD-10-CM | POA: Insufficient documentation

## 2020-12-30 DIAGNOSIS — I120 Hypertensive chronic kidney disease with stage 5 chronic kidney disease or end stage renal disease: Secondary | ICD-10-CM | POA: Diagnosis not present

## 2020-12-30 DIAGNOSIS — Z79899 Other long term (current) drug therapy: Secondary | ICD-10-CM | POA: Diagnosis not present

## 2020-12-30 DIAGNOSIS — L299 Pruritus, unspecified: Secondary | ICD-10-CM | POA: Diagnosis not present

## 2020-12-30 DIAGNOSIS — R1031 Right lower quadrant pain: Secondary | ICD-10-CM | POA: Diagnosis not present

## 2020-12-30 DIAGNOSIS — D631 Anemia in chronic kidney disease: Secondary | ICD-10-CM | POA: Diagnosis not present

## 2020-12-30 DIAGNOSIS — D689 Coagulation defect, unspecified: Secondary | ICD-10-CM | POA: Diagnosis not present

## 2020-12-30 DIAGNOSIS — K449 Diaphragmatic hernia without obstruction or gangrene: Secondary | ICD-10-CM | POA: Diagnosis not present

## 2020-12-30 DIAGNOSIS — Z992 Dependence on renal dialysis: Secondary | ICD-10-CM | POA: Insufficient documentation

## 2020-12-30 DIAGNOSIS — Z940 Kidney transplant status: Secondary | ICD-10-CM | POA: Diagnosis not present

## 2020-12-30 LAB — COMPREHENSIVE METABOLIC PANEL
ALT: 66 U/L — ABNORMAL HIGH (ref 0–44)
AST: 33 U/L (ref 15–41)
Albumin: 3.7 g/dL (ref 3.5–5.0)
Alkaline Phosphatase: 75 U/L (ref 38–126)
Anion gap: 13 (ref 5–15)
BUN: 35 mg/dL — ABNORMAL HIGH (ref 6–20)
CO2: 29 mmol/L (ref 22–32)
Calcium: 8.8 mg/dL — ABNORMAL LOW (ref 8.9–10.3)
Chloride: 97 mmol/L — ABNORMAL LOW (ref 98–111)
Creatinine, Ser: 10.62 mg/dL — ABNORMAL HIGH (ref 0.61–1.24)
GFR, Estimated: 6 mL/min — ABNORMAL LOW (ref >60)
Glucose, Bld: 99 mg/dL (ref 70–99)
Potassium: 4.6 mmol/L (ref 3.5–5.1)
Sodium: 139 mmol/L (ref 135–145)
Total Bilirubin: 0.7 mg/dL (ref 0.3–1.2)
Total Protein: 7.3 g/dL (ref 6.5–8.1)

## 2020-12-30 LAB — CBC
HCT: 31.7 % — ABNORMAL LOW (ref 39.0–52.0)
Hemoglobin: 10.2 g/dL — ABNORMAL LOW (ref 13.0–17.0)
MCH: 27.3 pg (ref 26.0–34.0)
MCHC: 32.2 g/dL (ref 30.0–36.0)
MCV: 85 fL (ref 80.0–100.0)
Platelets: 174 10*3/uL (ref 150–400)
RBC: 3.73 MIL/uL — ABNORMAL LOW (ref 4.22–5.81)
RDW: 14.4 % (ref 11.5–15.5)
WBC: 5.5 10*3/uL (ref 4.0–10.5)
nRBC: 0 % (ref 0.0–0.2)

## 2020-12-30 LAB — LIPASE, BLOOD: Lipase: 47 U/L (ref 11–51)

## 2020-12-30 NOTE — ED Triage Notes (Signed)
Pt reports RLQ abd pain that started 2 hour ago with some diarrhea, pt reports that hx of kidney transplant, dialysis pt MWF.

## 2020-12-31 ENCOUNTER — Emergency Department (HOSPITAL_COMMUNITY): Payer: Medicare Other

## 2020-12-31 DIAGNOSIS — Z940 Kidney transplant status: Secondary | ICD-10-CM | POA: Diagnosis not present

## 2020-12-31 DIAGNOSIS — K449 Diaphragmatic hernia without obstruction or gangrene: Secondary | ICD-10-CM | POA: Diagnosis not present

## 2020-12-31 DIAGNOSIS — N186 End stage renal disease: Secondary | ICD-10-CM | POA: Diagnosis not present

## 2020-12-31 DIAGNOSIS — K6389 Other specified diseases of intestine: Secondary | ICD-10-CM | POA: Diagnosis not present

## 2020-12-31 MED ORDER — LABETALOL HCL 200 MG PO TABS
300.0000 mg | ORAL_TABLET | Freq: Once | ORAL | Status: AC
  Administered 2020-12-31: 300 mg via ORAL
  Filled 2020-12-31: qty 2

## 2020-12-31 MED ORDER — AMOXICILLIN-POT CLAVULANATE 875-125 MG PO TABS: 1.0000 | ORAL_TABLET | Freq: Two times a day (BID) | ORAL | 0 refills | Status: DC

## 2020-12-31 MED ORDER — IOHEXOL 9 MG/ML PO SOLN: 500.0000 mL | ORAL | Status: AC

## 2020-12-31 MED ORDER — IOHEXOL 9 MG/ML PO SOLN
ORAL | Status: AC
  Administered 2020-12-31: 500 mL via ORAL
  Filled 2020-12-31: qty 1000

## 2020-12-31 NOTE — ED Notes (Signed)
Patient verbalizes understanding of discharge instructions. Opportunity for questioning and answers were provided. Armband removed by staff, pt discharged from ED.  

## 2020-12-31 NOTE — Discharge Instructions (Addendum)
You have been seen and discharged from the emergency department.  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 Take antibiotic as directed. Follow up with orthopedics for hip changes. Follow-up with your primary provider for reevaluation. Take home medications as prescribed. If you have any worsening symptoms, severe pain, inability to walk or further concerns for health please return to an emergency department for further evaluation.

## 2020-12-31 NOTE — ED Provider Notes (Signed)
[facility] EMERGENCY DEPARTMENT Provider Note   CSN: [id number] Arrival date & time: 12/30/20  2020     History Chief Complaint  Patient presents with  . Abdominal Pain    Tim Baker is a 45 y.o. male.  HPI   45 year old male with past medical history of kidney transplant on hemodialysis every M WF presents the emergency department right lower quadrant/lower abdominal pain.  Patient states this developed gradually yesterday after eating.  It is sharp at times, RLQ and lower abdomen.  He states that it is intermittent, does self resolve.  He has had a couple episodes of mild diarrhea, denies any bloody stool. No rectal pain.  Denies any fever/chills.  He said decreased appetite but no nausea/vomiting.  He went to a full dialysis session yesterday.  Denies any chest pain or shortness of breath, he has been without his nighttime and morning medications due to waiting in the ER.  Past Medical History:  Diagnosis Date  . Allergy   . Anemia   . ESRD (end stage renal disease) on dialysis [facility])    "MWF; [place]" (12/31/2015)  . GERD (gastroesophageal reflux disease)   . GI bleed   . H/O kidney transplant   . Heart murmur    "slight one"  . Hematochezia   . History of blood transfusion 12/31/2015   "this is my 1st" (12/31/2015)  . Hyperkalemia   . Hypertension   . Renal disorder     Patient Active Problem List   Diagnosis Date Noted  . Anemia 12/31/2015  . Acute blood loss anemia 12/31/2015  . HTN (hypertension) 12/31/2015  . CKD (chronic kidney disease) 12/31/2015  . Hematochezia 12/31/2015  . GI bleed   . Lower GI bleeding   . Severe anemia   . Renal transplant recipient   . Hyperkalemia   . Accelerated hypertension   . Anemia in chronic kidney disease   . Acute renal failure ([place])   . Metabolic acidosis 0000000  . Acute renal failure (ARF) ([place]) 12/02/2015    Past Surgical History:  Procedure Laterality Date  . ARTERIOVENOUS  GRAFT PLACEMENT Left 01/2006   forearm  . AV FISTULA PLACEMENT Left 02/20/2016   Procedure: REVISION OF LEFT ARTERIOVENOUS (AV) FISTULA;  Surgeon: Angelia Mould, MD;  Location: [place];  Service: Vascular;  Laterality: Left;  . AV FISTULA REPAIR  11/2015   "tried unsuccessfully to declot it with a balloon at [facility]"  . COLONOSCOPY N/A 01/02/2016   Procedure: COLONOSCOPY;  Surgeon: Carol Ada, MD;  Location: [facility] ENDOSCOPY;  Service: Endoscopy;  Laterality: N/A;  . ESOPHAGOGASTRODUODENOSCOPY N/A 01/02/2016   Procedure: ESOPHAGOGASTRODUODENOSCOPY (EGD);  Surgeon: Carol Ada, MD;  Location: [facility] ENDOSCOPY;  Service: Endoscopy;  Laterality: N/A;  . INSERTION OF DIALYSIS CATHETER Right 11/2015   "neck; a temporary one"  . INSERTION OF DIALYSIS CATHETER Right 11/2015   chest  . KIDNEY TRANSPLANT  2013       Family History  Problem Relation Age of Onset  . Hypertension Father   . Glaucoma Father   . Breast cancer Mother     Social History   Tobacco Use  . Smoking status: Never Smoker  . Smokeless tobacco: Never Used  Substance Use Topics  . Alcohol use: Yes    Comment: one beer a month  . Drug use: No    Home Medications Prior to Admission medications   Medication Sig Start Date End Date Taking? Authorizing Provider  amLODipine (  NORVASC) 10 MG tablet Take 10 mg by mouth at bedtime.     [provider]  Azelastine-Fluticasone (DYMISTA) 137-50 MCG/ACT SUSP Place 1 spray into the nose as needed (allergies).     [provider]  Bepotastine Besilate (BEPREVE) 1.5 % SOLN Apply 1 drop to eye as needed (allergies).     [provider]  calcium elemental as carbonate (TUMS ULTRA 1000) 400 MG chewable tablet Chew 2,000 mg by mouth 3 (three) times daily. 12/08/15   [provider]  fluconazole (DIFLUCAN) 50 MG tablet Take 50 mg by mouth daily. Reported on 03/25/2016 12/13/15   [provider]  labetalol (NORMODYNE) 300 MG tablet Take 600  mg by mouth 2 (two) times daily.     [provider]  mycophenolate (MYFORTIC) 180 MG EC tablet Take 540 mg by mouth 2 (two) times daily. Takes 3 (180 mg tablets) twice daily.    [provider]  oxyCODONE-acetaminophen (ROXICET) 5-325 MG tablet Take 1-2 tablets by mouth every 4 (four) hours as needed. Patient not taking: Reported on 03/30/2019 02/20/16   Angelia Mould, MD  pantoprazole (PROTONIX) 40 MG tablet TAKE 1 TABLET BY MOUTH TWICE A DAY FOR 8 WEEKS UNTIL 3/23, THEN 1 DAILY UNLESS OTHERWISE DIRECTED 02/27/16   [provider]  predniSONE (DELTASONE) 5 MG tablet Take 20 mg by mouth daily with breakfast.    [provider]  sodium bicarbonate 650 MG tablet Take 650-1,300 mg by mouth 2 (two) times daily. Reported on 03/25/2016 12/09/15   [provider]  sulfamethoxazole-trimethoprim (BACTRIM,SEPTRA) 400-80 MG tablet Take 1 tablet by mouth every Monday, Wednesday, and Friday. 12/09/15   [provider]  tacrolimus (PROGRAF) 1 MG capsule Take by mouth. Take two '1mg'$  tablet bid    [provider]  valGANciclovir (VALCYTE) 450 MG tablet Take 450 mg by mouth every Monday, Wednesday, and Friday. Reported on 03/25/2016 12/13/15   [provider]    Allergies    Patient has no known allergies.  Review of Systems   Review of Systems  Constitutional: Negative for chills and fever.  HENT: Negative for congestion.   Eyes: Negative for visual disturbance.  Respiratory: Negative for shortness of breath.   Cardiovascular: Negative for chest pain.  Gastrointestinal: Positive for abdominal pain and diarrhea. Negative for anal bleeding, blood in stool and vomiting.  Genitourinary: Negative for dysuria.  Skin: Negative for rash.  Neurological: Negative for headaches.    Physical Exam Updated Vital Signs BP (!) 224/127 (BP Location: Right Arm)   Pulse 74   Temp 98.7 F (37.1 C) (Oral)   Resp 15   SpO2 100%   Physical  Exam Vitals and nursing note reviewed.  Constitutional:      Appearance: Normal appearance.  HENT:     Head: Normocephalic.     Mouth/Throat:     Mouth: Mucous membranes are moist.  Cardiovascular:     Rate and Rhythm: Normal rate.  Pulmonary:     Effort: Pulmonary effort is normal. No respiratory distress.  Abdominal:     General: Bowel sounds are normal.     Palpations: Abdomen is soft.     Tenderness: There is no abdominal tenderness. There is no guarding or rebound. Negative signs include McBurney's sign.  Skin:    General: Skin is warm.  Neurological:     Mental Status: He is alert and oriented to person, place, and time. Mental status is at baseline.  Psychiatric:  Mood and Affect: Mood normal.     ED Results / Procedures / Treatments   Labs (all labs ordered are listed, but only abnormal results are displayed) Labs Reviewed  COMPREHENSIVE METABOLIC PANEL - Abnormal; Notable for the following components:      Result Value   Chloride 97 (*)    BUN 35 (*)    Creatinine, Ser 10.62 (*)    Calcium 8.8 (*)    ALT 66 (*)    GFR, Estimated 6 (*)    All other components within normal limits  CBC - Abnormal; Notable for the following components:   RBC 3.73 (*)    Hemoglobin 10.2 (*)    HCT 31.7 (*)    All other components within normal limits  LIPASE, BLOOD    EKG None  Radiology No results found.  Procedures Procedures (including critical care time)  Medications Ordered in ED Medications  labetalol (NORMODYNE) tablet 300 mg (has no administration in time range)    ED Course  I have reviewed the triage vital signs and the nursing notes.  Pertinent labs & imaging results that were available during my care of the patient were reviewed by me and considered in my medical decision making (see chart for details).    MDM Rules/Calculators/A&P                          45 year old male presents the emergency department right lower quadrant abdominal pain.   This has been intermittent, it is mild currently at this time.  Vitals show hypertension, patient has been without his nighttime and morning meds.  Morning meds have been offered.  Abdominal exam is benign, no tenderness elicited on exam.  Blood work shows no leukocytosis, kidney function is baseline for the patient.  We will do a CT of the abdomen pelvis without to evaluate for any acute pathology.  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  It also identifies a remnant of a calcified failed right renal transplant.  Unclear if this could be contributing to the patient's right lower abdominal discomfort.  In regards to the avascular necrosis patient states that he does at times have bilateral hip pain, he is never seek actual evaluation for because is never been debilitating.  I described the findings on the CAT scan and how this warrants outpatient orthopedic follow-up for preventative/treatment measures.  He understands.  Orthopedic referral has been placed in the patient's discharge papers.  Patient will be scribed antibiotics for colitis.  Patient will be discharged and treated as an outpatient.  Discharge plan and strict return to ED precautions discussed, patient verbalizes understanding and agreement.   Final Clinical Impression(s) / ED Diagnoses Final diagnoses:  None    Rx / DC Orders ED Discharge Orders    None       Lorelle Gibbs, DO 12/31/20 1537

## 2021-01-01 DIAGNOSIS — N186 End stage renal disease: Secondary | ICD-10-CM | POA: Diagnosis not present

## 2021-01-01 DIAGNOSIS — D509 Iron deficiency anemia, unspecified: Secondary | ICD-10-CM | POA: Diagnosis not present

## 2021-01-01 DIAGNOSIS — D631 Anemia in chronic kidney disease: Secondary | ICD-10-CM | POA: Diagnosis not present

## 2021-01-01 DIAGNOSIS — Z992 Dependence on renal dialysis: Secondary | ICD-10-CM | POA: Diagnosis not present

## 2021-01-01 DIAGNOSIS — L299 Pruritus, unspecified: Secondary | ICD-10-CM | POA: Diagnosis not present

## 2021-01-01 DIAGNOSIS — N2581 Secondary hyperparathyroidism of renal origin: Secondary | ICD-10-CM | POA: Diagnosis not present

## 2021-01-01 DIAGNOSIS — D689 Coagulation defect, unspecified: Secondary | ICD-10-CM | POA: Diagnosis not present

## 2021-01-03 DIAGNOSIS — L299 Pruritus, unspecified: Secondary | ICD-10-CM | POA: Diagnosis not present

## 2021-01-03 DIAGNOSIS — Z992 Dependence on renal dialysis: Secondary | ICD-10-CM | POA: Diagnosis not present

## 2021-01-03 DIAGNOSIS — D689 Coagulation defect, unspecified: Secondary | ICD-10-CM | POA: Diagnosis not present

## 2021-01-03 DIAGNOSIS — N186 End stage renal disease: Secondary | ICD-10-CM | POA: Diagnosis not present

## 2021-01-03 DIAGNOSIS — N2581 Secondary hyperparathyroidism of renal origin: Secondary | ICD-10-CM | POA: Diagnosis not present

## 2021-01-03 DIAGNOSIS — D631 Anemia in chronic kidney disease: Secondary | ICD-10-CM | POA: Diagnosis not present

## 2021-01-03 DIAGNOSIS — D509 Iron deficiency anemia, unspecified: Secondary | ICD-10-CM | POA: Diagnosis not present

## 2021-01-06 DIAGNOSIS — Z992 Dependence on renal dialysis: Secondary | ICD-10-CM | POA: Diagnosis not present

## 2021-01-06 DIAGNOSIS — L299 Pruritus, unspecified: Secondary | ICD-10-CM | POA: Diagnosis not present

## 2021-01-06 DIAGNOSIS — N186 End stage renal disease: Secondary | ICD-10-CM | POA: Diagnosis not present

## 2021-01-06 DIAGNOSIS — D631 Anemia in chronic kidney disease: Secondary | ICD-10-CM | POA: Diagnosis not present

## 2021-01-06 DIAGNOSIS — N2581 Secondary hyperparathyroidism of renal origin: Secondary | ICD-10-CM | POA: Diagnosis not present

## 2021-01-06 DIAGNOSIS — D689 Coagulation defect, unspecified: Secondary | ICD-10-CM | POA: Diagnosis not present

## 2021-01-06 DIAGNOSIS — D509 Iron deficiency anemia, unspecified: Secondary | ICD-10-CM | POA: Diagnosis not present

## 2021-01-08 DIAGNOSIS — N186 End stage renal disease: Secondary | ICD-10-CM | POA: Diagnosis not present

## 2021-01-08 DIAGNOSIS — D509 Iron deficiency anemia, unspecified: Secondary | ICD-10-CM | POA: Diagnosis not present

## 2021-01-08 DIAGNOSIS — D631 Anemia in chronic kidney disease: Secondary | ICD-10-CM | POA: Diagnosis not present

## 2021-01-08 DIAGNOSIS — L299 Pruritus, unspecified: Secondary | ICD-10-CM | POA: Diagnosis not present

## 2021-01-08 DIAGNOSIS — D689 Coagulation defect, unspecified: Secondary | ICD-10-CM | POA: Diagnosis not present

## 2021-01-08 DIAGNOSIS — Z992 Dependence on renal dialysis: Secondary | ICD-10-CM | POA: Diagnosis not present

## 2021-01-08 DIAGNOSIS — N2581 Secondary hyperparathyroidism of renal origin: Secondary | ICD-10-CM | POA: Diagnosis not present

## 2021-01-10 DIAGNOSIS — N186 End stage renal disease: Secondary | ICD-10-CM | POA: Diagnosis not present

## 2021-01-10 DIAGNOSIS — D509 Iron deficiency anemia, unspecified: Secondary | ICD-10-CM | POA: Diagnosis not present

## 2021-01-10 DIAGNOSIS — N2581 Secondary hyperparathyroidism of renal origin: Secondary | ICD-10-CM | POA: Diagnosis not present

## 2021-01-10 DIAGNOSIS — D631 Anemia in chronic kidney disease: Secondary | ICD-10-CM | POA: Diagnosis not present

## 2021-01-10 DIAGNOSIS — L299 Pruritus, unspecified: Secondary | ICD-10-CM | POA: Diagnosis not present

## 2021-01-10 DIAGNOSIS — D689 Coagulation defect, unspecified: Secondary | ICD-10-CM | POA: Diagnosis not present

## 2021-01-10 DIAGNOSIS — Z992 Dependence on renal dialysis: Secondary | ICD-10-CM | POA: Diagnosis not present

## 2021-01-13 DIAGNOSIS — N186 End stage renal disease: Secondary | ICD-10-CM | POA: Diagnosis not present

## 2021-01-13 DIAGNOSIS — N2581 Secondary hyperparathyroidism of renal origin: Secondary | ICD-10-CM | POA: Diagnosis not present

## 2021-01-13 DIAGNOSIS — Z992 Dependence on renal dialysis: Secondary | ICD-10-CM | POA: Diagnosis not present

## 2021-01-13 DIAGNOSIS — D689 Coagulation defect, unspecified: Secondary | ICD-10-CM | POA: Diagnosis not present

## 2021-01-13 DIAGNOSIS — T8610 Unspecified complication of kidney transplant: Secondary | ICD-10-CM | POA: Diagnosis not present

## 2021-01-13 DIAGNOSIS — L299 Pruritus, unspecified: Secondary | ICD-10-CM | POA: Diagnosis not present

## 2021-01-13 DIAGNOSIS — D631 Anemia in chronic kidney disease: Secondary | ICD-10-CM | POA: Diagnosis not present

## 2021-01-13 DIAGNOSIS — D509 Iron deficiency anemia, unspecified: Secondary | ICD-10-CM | POA: Diagnosis not present

## 2021-01-15 DIAGNOSIS — N2581 Secondary hyperparathyroidism of renal origin: Secondary | ICD-10-CM | POA: Diagnosis not present

## 2021-01-15 DIAGNOSIS — N186 End stage renal disease: Secondary | ICD-10-CM | POA: Diagnosis not present

## 2021-01-15 DIAGNOSIS — D631 Anemia in chronic kidney disease: Secondary | ICD-10-CM | POA: Diagnosis not present

## 2021-01-15 DIAGNOSIS — D509 Iron deficiency anemia, unspecified: Secondary | ICD-10-CM | POA: Diagnosis not present

## 2021-01-15 DIAGNOSIS — L299 Pruritus, unspecified: Secondary | ICD-10-CM | POA: Diagnosis not present

## 2021-01-15 DIAGNOSIS — Z992 Dependence on renal dialysis: Secondary | ICD-10-CM | POA: Diagnosis not present

## 2021-01-15 DIAGNOSIS — D689 Coagulation defect, unspecified: Secondary | ICD-10-CM | POA: Diagnosis not present

## 2021-01-17 DIAGNOSIS — N2581 Secondary hyperparathyroidism of renal origin: Secondary | ICD-10-CM | POA: Diagnosis not present

## 2021-01-17 DIAGNOSIS — L299 Pruritus, unspecified: Secondary | ICD-10-CM | POA: Diagnosis not present

## 2021-01-17 DIAGNOSIS — D509 Iron deficiency anemia, unspecified: Secondary | ICD-10-CM | POA: Diagnosis not present

## 2021-01-17 DIAGNOSIS — Z992 Dependence on renal dialysis: Secondary | ICD-10-CM | POA: Diagnosis not present

## 2021-01-17 DIAGNOSIS — N186 End stage renal disease: Secondary | ICD-10-CM | POA: Diagnosis not present

## 2021-01-17 DIAGNOSIS — D689 Coagulation defect, unspecified: Secondary | ICD-10-CM | POA: Diagnosis not present

## 2021-01-17 DIAGNOSIS — D631 Anemia in chronic kidney disease: Secondary | ICD-10-CM | POA: Diagnosis not present

## 2021-01-20 DIAGNOSIS — L299 Pruritus, unspecified: Secondary | ICD-10-CM | POA: Diagnosis not present

## 2021-01-20 DIAGNOSIS — D509 Iron deficiency anemia, unspecified: Secondary | ICD-10-CM | POA: Diagnosis not present

## 2021-01-20 DIAGNOSIS — Z992 Dependence on renal dialysis: Secondary | ICD-10-CM | POA: Diagnosis not present

## 2021-01-20 DIAGNOSIS — N2581 Secondary hyperparathyroidism of renal origin: Secondary | ICD-10-CM | POA: Diagnosis not present

## 2021-01-20 DIAGNOSIS — D689 Coagulation defect, unspecified: Secondary | ICD-10-CM | POA: Diagnosis not present

## 2021-01-20 DIAGNOSIS — R5383 Other fatigue: Secondary | ICD-10-CM | POA: Diagnosis not present

## 2021-01-20 DIAGNOSIS — D631 Anemia in chronic kidney disease: Secondary | ICD-10-CM | POA: Diagnosis not present

## 2021-01-20 DIAGNOSIS — N186 End stage renal disease: Secondary | ICD-10-CM | POA: Diagnosis not present

## 2021-01-22 DIAGNOSIS — L299 Pruritus, unspecified: Secondary | ICD-10-CM | POA: Diagnosis not present

## 2021-01-22 DIAGNOSIS — N2581 Secondary hyperparathyroidism of renal origin: Secondary | ICD-10-CM | POA: Diagnosis not present

## 2021-01-22 DIAGNOSIS — Z992 Dependence on renal dialysis: Secondary | ICD-10-CM | POA: Diagnosis not present

## 2021-01-22 DIAGNOSIS — D509 Iron deficiency anemia, unspecified: Secondary | ICD-10-CM | POA: Diagnosis not present

## 2021-01-22 DIAGNOSIS — D631 Anemia in chronic kidney disease: Secondary | ICD-10-CM | POA: Diagnosis not present

## 2021-01-22 DIAGNOSIS — N186 End stage renal disease: Secondary | ICD-10-CM | POA: Diagnosis not present

## 2021-01-22 DIAGNOSIS — D689 Coagulation defect, unspecified: Secondary | ICD-10-CM | POA: Diagnosis not present

## 2021-01-24 DIAGNOSIS — L299 Pruritus, unspecified: Secondary | ICD-10-CM | POA: Diagnosis not present

## 2021-01-24 DIAGNOSIS — N2581 Secondary hyperparathyroidism of renal origin: Secondary | ICD-10-CM | POA: Diagnosis not present

## 2021-01-24 DIAGNOSIS — D631 Anemia in chronic kidney disease: Secondary | ICD-10-CM | POA: Diagnosis not present

## 2021-01-24 DIAGNOSIS — Z992 Dependence on renal dialysis: Secondary | ICD-10-CM | POA: Diagnosis not present

## 2021-01-24 DIAGNOSIS — D509 Iron deficiency anemia, unspecified: Secondary | ICD-10-CM | POA: Diagnosis not present

## 2021-01-24 DIAGNOSIS — N186 End stage renal disease: Secondary | ICD-10-CM | POA: Diagnosis not present

## 2021-01-24 DIAGNOSIS — D689 Coagulation defect, unspecified: Secondary | ICD-10-CM | POA: Diagnosis not present

## 2021-01-27 DIAGNOSIS — D631 Anemia in chronic kidney disease: Secondary | ICD-10-CM | POA: Diagnosis not present

## 2021-01-27 DIAGNOSIS — N2581 Secondary hyperparathyroidism of renal origin: Secondary | ICD-10-CM | POA: Diagnosis not present

## 2021-01-27 DIAGNOSIS — L299 Pruritus, unspecified: Secondary | ICD-10-CM | POA: Diagnosis not present

## 2021-01-27 DIAGNOSIS — Z992 Dependence on renal dialysis: Secondary | ICD-10-CM | POA: Diagnosis not present

## 2021-01-27 DIAGNOSIS — D509 Iron deficiency anemia, unspecified: Secondary | ICD-10-CM | POA: Diagnosis not present

## 2021-01-27 DIAGNOSIS — D689 Coagulation defect, unspecified: Secondary | ICD-10-CM | POA: Diagnosis not present

## 2021-01-27 DIAGNOSIS — N186 End stage renal disease: Secondary | ICD-10-CM | POA: Diagnosis not present

## 2021-01-29 DIAGNOSIS — L299 Pruritus, unspecified: Secondary | ICD-10-CM | POA: Diagnosis not present

## 2021-01-29 DIAGNOSIS — D509 Iron deficiency anemia, unspecified: Secondary | ICD-10-CM | POA: Diagnosis not present

## 2021-01-29 DIAGNOSIS — N186 End stage renal disease: Secondary | ICD-10-CM | POA: Diagnosis not present

## 2021-01-29 DIAGNOSIS — N2581 Secondary hyperparathyroidism of renal origin: Secondary | ICD-10-CM | POA: Diagnosis not present

## 2021-01-29 DIAGNOSIS — D689 Coagulation defect, unspecified: Secondary | ICD-10-CM | POA: Diagnosis not present

## 2021-01-29 DIAGNOSIS — D631 Anemia in chronic kidney disease: Secondary | ICD-10-CM | POA: Diagnosis not present

## 2021-01-29 DIAGNOSIS — Z992 Dependence on renal dialysis: Secondary | ICD-10-CM | POA: Diagnosis not present

## 2021-01-31 DIAGNOSIS — D689 Coagulation defect, unspecified: Secondary | ICD-10-CM | POA: Diagnosis not present

## 2021-01-31 DIAGNOSIS — L299 Pruritus, unspecified: Secondary | ICD-10-CM | POA: Diagnosis not present

## 2021-01-31 DIAGNOSIS — D509 Iron deficiency anemia, unspecified: Secondary | ICD-10-CM | POA: Diagnosis not present

## 2021-01-31 DIAGNOSIS — D631 Anemia in chronic kidney disease: Secondary | ICD-10-CM | POA: Diagnosis not present

## 2021-01-31 DIAGNOSIS — N2581 Secondary hyperparathyroidism of renal origin: Secondary | ICD-10-CM | POA: Diagnosis not present

## 2021-01-31 DIAGNOSIS — N186 End stage renal disease: Secondary | ICD-10-CM | POA: Diagnosis not present

## 2021-01-31 DIAGNOSIS — Z992 Dependence on renal dialysis: Secondary | ICD-10-CM | POA: Diagnosis not present

## 2021-02-03 DIAGNOSIS — D631 Anemia in chronic kidney disease: Secondary | ICD-10-CM | POA: Diagnosis not present

## 2021-02-03 DIAGNOSIS — Z992 Dependence on renal dialysis: Secondary | ICD-10-CM | POA: Diagnosis not present

## 2021-02-03 DIAGNOSIS — N186 End stage renal disease: Secondary | ICD-10-CM | POA: Diagnosis not present

## 2021-02-03 DIAGNOSIS — L299 Pruritus, unspecified: Secondary | ICD-10-CM | POA: Diagnosis not present

## 2021-02-03 DIAGNOSIS — N2581 Secondary hyperparathyroidism of renal origin: Secondary | ICD-10-CM | POA: Diagnosis not present

## 2021-02-03 DIAGNOSIS — D689 Coagulation defect, unspecified: Secondary | ICD-10-CM | POA: Diagnosis not present

## 2021-02-03 DIAGNOSIS — D509 Iron deficiency anemia, unspecified: Secondary | ICD-10-CM | POA: Diagnosis not present

## 2021-02-05 DIAGNOSIS — D631 Anemia in chronic kidney disease: Secondary | ICD-10-CM | POA: Diagnosis not present

## 2021-02-05 DIAGNOSIS — D509 Iron deficiency anemia, unspecified: Secondary | ICD-10-CM | POA: Diagnosis not present

## 2021-02-05 DIAGNOSIS — D689 Coagulation defect, unspecified: Secondary | ICD-10-CM | POA: Diagnosis not present

## 2021-02-05 DIAGNOSIS — Z992 Dependence on renal dialysis: Secondary | ICD-10-CM | POA: Diagnosis not present

## 2021-02-05 DIAGNOSIS — L299 Pruritus, unspecified: Secondary | ICD-10-CM | POA: Diagnosis not present

## 2021-02-05 DIAGNOSIS — N2581 Secondary hyperparathyroidism of renal origin: Secondary | ICD-10-CM | POA: Diagnosis not present

## 2021-02-05 DIAGNOSIS — N186 End stage renal disease: Secondary | ICD-10-CM | POA: Diagnosis not present

## 2021-02-07 DIAGNOSIS — D509 Iron deficiency anemia, unspecified: Secondary | ICD-10-CM | POA: Diagnosis not present

## 2021-02-07 DIAGNOSIS — N2581 Secondary hyperparathyroidism of renal origin: Secondary | ICD-10-CM | POA: Diagnosis not present

## 2021-02-07 DIAGNOSIS — N186 End stage renal disease: Secondary | ICD-10-CM | POA: Diagnosis not present

## 2021-02-07 DIAGNOSIS — L299 Pruritus, unspecified: Secondary | ICD-10-CM | POA: Diagnosis not present

## 2021-02-07 DIAGNOSIS — D689 Coagulation defect, unspecified: Secondary | ICD-10-CM | POA: Diagnosis not present

## 2021-02-07 DIAGNOSIS — D631 Anemia in chronic kidney disease: Secondary | ICD-10-CM | POA: Diagnosis not present

## 2021-02-07 DIAGNOSIS — Z992 Dependence on renal dialysis: Secondary | ICD-10-CM | POA: Diagnosis not present

## 2021-02-10 DIAGNOSIS — L299 Pruritus, unspecified: Secondary | ICD-10-CM | POA: Diagnosis not present

## 2021-02-10 DIAGNOSIS — D509 Iron deficiency anemia, unspecified: Secondary | ICD-10-CM | POA: Diagnosis not present

## 2021-02-10 DIAGNOSIS — Z992 Dependence on renal dialysis: Secondary | ICD-10-CM | POA: Diagnosis not present

## 2021-02-10 DIAGNOSIS — N186 End stage renal disease: Secondary | ICD-10-CM | POA: Diagnosis not present

## 2021-02-10 DIAGNOSIS — D631 Anemia in chronic kidney disease: Secondary | ICD-10-CM | POA: Diagnosis not present

## 2021-02-10 DIAGNOSIS — N2581 Secondary hyperparathyroidism of renal origin: Secondary | ICD-10-CM | POA: Diagnosis not present

## 2021-02-10 DIAGNOSIS — T8610 Unspecified complication of kidney transplant: Secondary | ICD-10-CM | POA: Diagnosis not present

## 2021-02-10 DIAGNOSIS — D689 Coagulation defect, unspecified: Secondary | ICD-10-CM | POA: Diagnosis not present

## 2021-02-12 DIAGNOSIS — N186 End stage renal disease: Secondary | ICD-10-CM | POA: Diagnosis not present

## 2021-02-12 DIAGNOSIS — Z992 Dependence on renal dialysis: Secondary | ICD-10-CM | POA: Diagnosis not present

## 2021-02-12 DIAGNOSIS — D689 Coagulation defect, unspecified: Secondary | ICD-10-CM | POA: Diagnosis not present

## 2021-02-12 DIAGNOSIS — D509 Iron deficiency anemia, unspecified: Secondary | ICD-10-CM | POA: Diagnosis not present

## 2021-02-12 DIAGNOSIS — N2581 Secondary hyperparathyroidism of renal origin: Secondary | ICD-10-CM | POA: Diagnosis not present

## 2021-02-12 DIAGNOSIS — D631 Anemia in chronic kidney disease: Secondary | ICD-10-CM | POA: Diagnosis not present

## 2021-02-14 DIAGNOSIS — D689 Coagulation defect, unspecified: Secondary | ICD-10-CM | POA: Diagnosis not present

## 2021-02-14 DIAGNOSIS — D509 Iron deficiency anemia, unspecified: Secondary | ICD-10-CM | POA: Diagnosis not present

## 2021-02-14 DIAGNOSIS — D631 Anemia in chronic kidney disease: Secondary | ICD-10-CM | POA: Diagnosis not present

## 2021-02-14 DIAGNOSIS — N2581 Secondary hyperparathyroidism of renal origin: Secondary | ICD-10-CM | POA: Diagnosis not present

## 2021-02-14 DIAGNOSIS — N186 End stage renal disease: Secondary | ICD-10-CM | POA: Diagnosis not present

## 2021-02-14 DIAGNOSIS — Z992 Dependence on renal dialysis: Secondary | ICD-10-CM | POA: Diagnosis not present

## 2021-02-17 DIAGNOSIS — N186 End stage renal disease: Secondary | ICD-10-CM | POA: Diagnosis not present

## 2021-02-17 DIAGNOSIS — N2581 Secondary hyperparathyroidism of renal origin: Secondary | ICD-10-CM | POA: Diagnosis not present

## 2021-02-17 DIAGNOSIS — Z992 Dependence on renal dialysis: Secondary | ICD-10-CM | POA: Diagnosis not present

## 2021-02-17 DIAGNOSIS — D689 Coagulation defect, unspecified: Secondary | ICD-10-CM | POA: Diagnosis not present

## 2021-02-17 DIAGNOSIS — D509 Iron deficiency anemia, unspecified: Secondary | ICD-10-CM | POA: Diagnosis not present

## 2021-02-17 DIAGNOSIS — D631 Anemia in chronic kidney disease: Secondary | ICD-10-CM | POA: Diagnosis not present

## 2021-02-19 DIAGNOSIS — D689 Coagulation defect, unspecified: Secondary | ICD-10-CM | POA: Diagnosis not present

## 2021-02-19 DIAGNOSIS — Z992 Dependence on renal dialysis: Secondary | ICD-10-CM | POA: Diagnosis not present

## 2021-02-19 DIAGNOSIS — D631 Anemia in chronic kidney disease: Secondary | ICD-10-CM | POA: Diagnosis not present

## 2021-02-19 DIAGNOSIS — N186 End stage renal disease: Secondary | ICD-10-CM | POA: Diagnosis not present

## 2021-02-19 DIAGNOSIS — N2581 Secondary hyperparathyroidism of renal origin: Secondary | ICD-10-CM | POA: Diagnosis not present

## 2021-02-19 DIAGNOSIS — D509 Iron deficiency anemia, unspecified: Secondary | ICD-10-CM | POA: Diagnosis not present

## 2021-02-21 DIAGNOSIS — Z992 Dependence on renal dialysis: Secondary | ICD-10-CM | POA: Diagnosis not present

## 2021-02-21 DIAGNOSIS — N2581 Secondary hyperparathyroidism of renal origin: Secondary | ICD-10-CM | POA: Diagnosis not present

## 2021-02-21 DIAGNOSIS — D509 Iron deficiency anemia, unspecified: Secondary | ICD-10-CM | POA: Diagnosis not present

## 2021-02-21 DIAGNOSIS — D631 Anemia in chronic kidney disease: Secondary | ICD-10-CM | POA: Diagnosis not present

## 2021-02-21 DIAGNOSIS — N186 End stage renal disease: Secondary | ICD-10-CM | POA: Diagnosis not present

## 2021-02-21 DIAGNOSIS — D689 Coagulation defect, unspecified: Secondary | ICD-10-CM | POA: Diagnosis not present

## 2021-02-24 DIAGNOSIS — D631 Anemia in chronic kidney disease: Secondary | ICD-10-CM | POA: Diagnosis not present

## 2021-02-24 DIAGNOSIS — N2581 Secondary hyperparathyroidism of renal origin: Secondary | ICD-10-CM | POA: Diagnosis not present

## 2021-02-24 DIAGNOSIS — D509 Iron deficiency anemia, unspecified: Secondary | ICD-10-CM | POA: Diagnosis not present

## 2021-02-24 DIAGNOSIS — Z992 Dependence on renal dialysis: Secondary | ICD-10-CM | POA: Diagnosis not present

## 2021-02-24 DIAGNOSIS — N186 End stage renal disease: Secondary | ICD-10-CM | POA: Diagnosis not present

## 2021-02-24 DIAGNOSIS — D689 Coagulation defect, unspecified: Secondary | ICD-10-CM | POA: Diagnosis not present

## 2021-02-25 DIAGNOSIS — M87851 Other osteonecrosis, right femur: Secondary | ICD-10-CM | POA: Diagnosis not present

## 2021-02-25 DIAGNOSIS — M7061 Trochanteric bursitis, right hip: Secondary | ICD-10-CM | POA: Diagnosis not present

## 2021-02-25 DIAGNOSIS — M7062 Trochanteric bursitis, left hip: Secondary | ICD-10-CM | POA: Diagnosis not present

## 2021-02-25 DIAGNOSIS — M87852 Other osteonecrosis, left femur: Secondary | ICD-10-CM | POA: Diagnosis not present

## 2021-02-26 DIAGNOSIS — D509 Iron deficiency anemia, unspecified: Secondary | ICD-10-CM | POA: Diagnosis not present

## 2021-02-26 DIAGNOSIS — N186 End stage renal disease: Secondary | ICD-10-CM | POA: Diagnosis not present

## 2021-02-26 DIAGNOSIS — D631 Anemia in chronic kidney disease: Secondary | ICD-10-CM | POA: Diagnosis not present

## 2021-02-26 DIAGNOSIS — D689 Coagulation defect, unspecified: Secondary | ICD-10-CM | POA: Diagnosis not present

## 2021-02-26 DIAGNOSIS — N2581 Secondary hyperparathyroidism of renal origin: Secondary | ICD-10-CM | POA: Diagnosis not present

## 2021-02-26 DIAGNOSIS — Z992 Dependence on renal dialysis: Secondary | ICD-10-CM | POA: Diagnosis not present

## 2021-02-28 DIAGNOSIS — D631 Anemia in chronic kidney disease: Secondary | ICD-10-CM | POA: Diagnosis not present

## 2021-02-28 DIAGNOSIS — D689 Coagulation defect, unspecified: Secondary | ICD-10-CM | POA: Diagnosis not present

## 2021-02-28 DIAGNOSIS — N186 End stage renal disease: Secondary | ICD-10-CM | POA: Diagnosis not present

## 2021-02-28 DIAGNOSIS — Z992 Dependence on renal dialysis: Secondary | ICD-10-CM | POA: Diagnosis not present

## 2021-02-28 DIAGNOSIS — N2581 Secondary hyperparathyroidism of renal origin: Secondary | ICD-10-CM | POA: Diagnosis not present

## 2021-02-28 DIAGNOSIS — D509 Iron deficiency anemia, unspecified: Secondary | ICD-10-CM | POA: Diagnosis not present

## 2021-03-03 DIAGNOSIS — R197 Diarrhea, unspecified: Secondary | ICD-10-CM | POA: Diagnosis not present

## 2021-03-03 DIAGNOSIS — N2581 Secondary hyperparathyroidism of renal origin: Secondary | ICD-10-CM | POA: Diagnosis not present

## 2021-03-03 DIAGNOSIS — D631 Anemia in chronic kidney disease: Secondary | ICD-10-CM | POA: Diagnosis not present

## 2021-03-03 DIAGNOSIS — Z992 Dependence on renal dialysis: Secondary | ICD-10-CM | POA: Diagnosis not present

## 2021-03-03 DIAGNOSIS — N186 End stage renal disease: Secondary | ICD-10-CM | POA: Diagnosis not present

## 2021-03-03 DIAGNOSIS — D689 Coagulation defect, unspecified: Secondary | ICD-10-CM | POA: Diagnosis not present

## 2021-03-03 DIAGNOSIS — D509 Iron deficiency anemia, unspecified: Secondary | ICD-10-CM | POA: Diagnosis not present

## 2021-03-05 DIAGNOSIS — D631 Anemia in chronic kidney disease: Secondary | ICD-10-CM | POA: Diagnosis not present

## 2021-03-05 DIAGNOSIS — N2581 Secondary hyperparathyroidism of renal origin: Secondary | ICD-10-CM | POA: Diagnosis not present

## 2021-03-05 DIAGNOSIS — D509 Iron deficiency anemia, unspecified: Secondary | ICD-10-CM | POA: Diagnosis not present

## 2021-03-05 DIAGNOSIS — R197 Diarrhea, unspecified: Secondary | ICD-10-CM | POA: Diagnosis not present

## 2021-03-05 DIAGNOSIS — N186 End stage renal disease: Secondary | ICD-10-CM | POA: Diagnosis not present

## 2021-03-05 DIAGNOSIS — Z992 Dependence on renal dialysis: Secondary | ICD-10-CM | POA: Diagnosis not present

## 2021-03-05 DIAGNOSIS — D689 Coagulation defect, unspecified: Secondary | ICD-10-CM | POA: Diagnosis not present

## 2021-03-06 DIAGNOSIS — M25552 Pain in left hip: Secondary | ICD-10-CM | POA: Diagnosis not present

## 2021-03-06 DIAGNOSIS — M25551 Pain in right hip: Secondary | ICD-10-CM | POA: Diagnosis not present

## 2021-03-07 DIAGNOSIS — N2581 Secondary hyperparathyroidism of renal origin: Secondary | ICD-10-CM | POA: Diagnosis not present

## 2021-03-07 DIAGNOSIS — Z992 Dependence on renal dialysis: Secondary | ICD-10-CM | POA: Diagnosis not present

## 2021-03-07 DIAGNOSIS — R197 Diarrhea, unspecified: Secondary | ICD-10-CM | POA: Diagnosis not present

## 2021-03-07 DIAGNOSIS — D509 Iron deficiency anemia, unspecified: Secondary | ICD-10-CM | POA: Diagnosis not present

## 2021-03-07 DIAGNOSIS — N186 End stage renal disease: Secondary | ICD-10-CM | POA: Diagnosis not present

## 2021-03-07 DIAGNOSIS — D631 Anemia in chronic kidney disease: Secondary | ICD-10-CM | POA: Diagnosis not present

## 2021-03-07 DIAGNOSIS — D689 Coagulation defect, unspecified: Secondary | ICD-10-CM | POA: Diagnosis not present

## 2021-03-10 DIAGNOSIS — D689 Coagulation defect, unspecified: Secondary | ICD-10-CM | POA: Diagnosis not present

## 2021-03-10 DIAGNOSIS — Z992 Dependence on renal dialysis: Secondary | ICD-10-CM | POA: Diagnosis not present

## 2021-03-10 DIAGNOSIS — D509 Iron deficiency anemia, unspecified: Secondary | ICD-10-CM | POA: Diagnosis not present

## 2021-03-10 DIAGNOSIS — D631 Anemia in chronic kidney disease: Secondary | ICD-10-CM | POA: Diagnosis not present

## 2021-03-10 DIAGNOSIS — N186 End stage renal disease: Secondary | ICD-10-CM | POA: Diagnosis not present

## 2021-03-10 DIAGNOSIS — L299 Pruritus, unspecified: Secondary | ICD-10-CM | POA: Diagnosis not present

## 2021-03-10 DIAGNOSIS — N2581 Secondary hyperparathyroidism of renal origin: Secondary | ICD-10-CM | POA: Diagnosis not present

## 2021-03-12 DIAGNOSIS — Z992 Dependence on renal dialysis: Secondary | ICD-10-CM | POA: Diagnosis not present

## 2021-03-12 DIAGNOSIS — N2581 Secondary hyperparathyroidism of renal origin: Secondary | ICD-10-CM | POA: Diagnosis not present

## 2021-03-12 DIAGNOSIS — L299 Pruritus, unspecified: Secondary | ICD-10-CM | POA: Diagnosis not present

## 2021-03-12 DIAGNOSIS — N186 End stage renal disease: Secondary | ICD-10-CM | POA: Diagnosis not present

## 2021-03-12 DIAGNOSIS — D631 Anemia in chronic kidney disease: Secondary | ICD-10-CM | POA: Diagnosis not present

## 2021-03-12 DIAGNOSIS — D509 Iron deficiency anemia, unspecified: Secondary | ICD-10-CM | POA: Diagnosis not present

## 2021-03-12 DIAGNOSIS — D689 Coagulation defect, unspecified: Secondary | ICD-10-CM | POA: Diagnosis not present

## 2021-03-13 DIAGNOSIS — T8610 Unspecified complication of kidney transplant: Secondary | ICD-10-CM | POA: Diagnosis not present

## 2021-03-13 DIAGNOSIS — N186 End stage renal disease: Secondary | ICD-10-CM | POA: Diagnosis not present

## 2021-03-13 DIAGNOSIS — Z992 Dependence on renal dialysis: Secondary | ICD-10-CM | POA: Diagnosis not present

## 2021-03-14 DIAGNOSIS — D689 Coagulation defect, unspecified: Secondary | ICD-10-CM | POA: Diagnosis not present

## 2021-03-14 DIAGNOSIS — L299 Pruritus, unspecified: Secondary | ICD-10-CM | POA: Diagnosis not present

## 2021-03-14 DIAGNOSIS — N2581 Secondary hyperparathyroidism of renal origin: Secondary | ICD-10-CM | POA: Diagnosis not present

## 2021-03-14 DIAGNOSIS — N186 End stage renal disease: Secondary | ICD-10-CM | POA: Diagnosis not present

## 2021-03-14 DIAGNOSIS — Z992 Dependence on renal dialysis: Secondary | ICD-10-CM | POA: Diagnosis not present

## 2021-03-17 DIAGNOSIS — D631 Anemia in chronic kidney disease: Secondary | ICD-10-CM | POA: Diagnosis not present

## 2021-03-17 DIAGNOSIS — N186 End stage renal disease: Secondary | ICD-10-CM | POA: Diagnosis not present

## 2021-03-17 DIAGNOSIS — D689 Coagulation defect, unspecified: Secondary | ICD-10-CM | POA: Diagnosis not present

## 2021-03-17 DIAGNOSIS — I10 Essential (primary) hypertension: Secondary | ICD-10-CM | POA: Diagnosis not present

## 2021-03-17 DIAGNOSIS — N2581 Secondary hyperparathyroidism of renal origin: Secondary | ICD-10-CM | POA: Diagnosis not present

## 2021-03-17 DIAGNOSIS — D509 Iron deficiency anemia, unspecified: Secondary | ICD-10-CM | POA: Diagnosis not present

## 2021-03-17 DIAGNOSIS — Z992 Dependence on renal dialysis: Secondary | ICD-10-CM | POA: Diagnosis not present

## 2021-03-19 DIAGNOSIS — N186 End stage renal disease: Secondary | ICD-10-CM | POA: Diagnosis not present

## 2021-03-19 DIAGNOSIS — N2581 Secondary hyperparathyroidism of renal origin: Secondary | ICD-10-CM | POA: Diagnosis not present

## 2021-03-19 DIAGNOSIS — D689 Coagulation defect, unspecified: Secondary | ICD-10-CM | POA: Diagnosis not present

## 2021-03-19 DIAGNOSIS — I10 Essential (primary) hypertension: Secondary | ICD-10-CM | POA: Diagnosis not present

## 2021-03-19 DIAGNOSIS — Z992 Dependence on renal dialysis: Secondary | ICD-10-CM | POA: Diagnosis not present

## 2021-03-19 DIAGNOSIS — D509 Iron deficiency anemia, unspecified: Secondary | ICD-10-CM | POA: Diagnosis not present

## 2021-03-19 DIAGNOSIS — D631 Anemia in chronic kidney disease: Secondary | ICD-10-CM | POA: Diagnosis not present

## 2021-03-21 DIAGNOSIS — N2581 Secondary hyperparathyroidism of renal origin: Secondary | ICD-10-CM | POA: Diagnosis not present

## 2021-03-21 DIAGNOSIS — D509 Iron deficiency anemia, unspecified: Secondary | ICD-10-CM | POA: Diagnosis not present

## 2021-03-21 DIAGNOSIS — Z992 Dependence on renal dialysis: Secondary | ICD-10-CM | POA: Diagnosis not present

## 2021-03-21 DIAGNOSIS — I10 Essential (primary) hypertension: Secondary | ICD-10-CM | POA: Diagnosis not present

## 2021-03-21 DIAGNOSIS — D631 Anemia in chronic kidney disease: Secondary | ICD-10-CM | POA: Diagnosis not present

## 2021-03-21 DIAGNOSIS — N186 End stage renal disease: Secondary | ICD-10-CM | POA: Diagnosis not present

## 2021-03-21 DIAGNOSIS — D689 Coagulation defect, unspecified: Secondary | ICD-10-CM | POA: Diagnosis not present

## 2021-03-24 DIAGNOSIS — N186 End stage renal disease: Secondary | ICD-10-CM | POA: Diagnosis not present

## 2021-03-24 DIAGNOSIS — D689 Coagulation defect, unspecified: Secondary | ICD-10-CM | POA: Diagnosis not present

## 2021-03-24 DIAGNOSIS — Z992 Dependence on renal dialysis: Secondary | ICD-10-CM | POA: Diagnosis not present

## 2021-03-24 DIAGNOSIS — N2581 Secondary hyperparathyroidism of renal origin: Secondary | ICD-10-CM | POA: Diagnosis not present

## 2021-03-24 DIAGNOSIS — I120 Hypertensive chronic kidney disease with stage 5 chronic kidney disease or end stage renal disease: Secondary | ICD-10-CM | POA: Diagnosis not present

## 2021-03-24 DIAGNOSIS — D631 Anemia in chronic kidney disease: Secondary | ICD-10-CM | POA: Diagnosis not present

## 2021-03-24 DIAGNOSIS — R5383 Other fatigue: Secondary | ICD-10-CM | POA: Diagnosis not present

## 2021-03-24 DIAGNOSIS — L299 Pruritus, unspecified: Secondary | ICD-10-CM | POA: Diagnosis not present

## 2021-03-26 DIAGNOSIS — Z992 Dependence on renal dialysis: Secondary | ICD-10-CM | POA: Diagnosis not present

## 2021-03-26 DIAGNOSIS — L299 Pruritus, unspecified: Secondary | ICD-10-CM | POA: Diagnosis not present

## 2021-03-26 DIAGNOSIS — D631 Anemia in chronic kidney disease: Secondary | ICD-10-CM | POA: Diagnosis not present

## 2021-03-26 DIAGNOSIS — N2581 Secondary hyperparathyroidism of renal origin: Secondary | ICD-10-CM | POA: Diagnosis not present

## 2021-03-26 DIAGNOSIS — N186 End stage renal disease: Secondary | ICD-10-CM | POA: Diagnosis not present

## 2021-03-26 DIAGNOSIS — D689 Coagulation defect, unspecified: Secondary | ICD-10-CM | POA: Diagnosis not present

## 2021-03-28 DIAGNOSIS — N186 End stage renal disease: Secondary | ICD-10-CM | POA: Diagnosis not present

## 2021-03-28 DIAGNOSIS — D689 Coagulation defect, unspecified: Secondary | ICD-10-CM | POA: Diagnosis not present

## 2021-03-28 DIAGNOSIS — D631 Anemia in chronic kidney disease: Secondary | ICD-10-CM | POA: Diagnosis not present

## 2021-03-28 DIAGNOSIS — N2581 Secondary hyperparathyroidism of renal origin: Secondary | ICD-10-CM | POA: Diagnosis not present

## 2021-03-28 DIAGNOSIS — Z992 Dependence on renal dialysis: Secondary | ICD-10-CM | POA: Diagnosis not present

## 2021-03-28 DIAGNOSIS — L299 Pruritus, unspecified: Secondary | ICD-10-CM | POA: Diagnosis not present

## 2021-03-31 DIAGNOSIS — D689 Coagulation defect, unspecified: Secondary | ICD-10-CM | POA: Diagnosis not present

## 2021-03-31 DIAGNOSIS — N186 End stage renal disease: Secondary | ICD-10-CM | POA: Diagnosis not present

## 2021-03-31 DIAGNOSIS — Z992 Dependence on renal dialysis: Secondary | ICD-10-CM | POA: Diagnosis not present

## 2021-03-31 DIAGNOSIS — L299 Pruritus, unspecified: Secondary | ICD-10-CM | POA: Diagnosis not present

## 2021-03-31 DIAGNOSIS — D631 Anemia in chronic kidney disease: Secondary | ICD-10-CM | POA: Diagnosis not present

## 2021-03-31 DIAGNOSIS — N2581 Secondary hyperparathyroidism of renal origin: Secondary | ICD-10-CM | POA: Diagnosis not present

## 2021-04-02 DIAGNOSIS — N2581 Secondary hyperparathyroidism of renal origin: Secondary | ICD-10-CM | POA: Diagnosis not present

## 2021-04-02 DIAGNOSIS — D689 Coagulation defect, unspecified: Secondary | ICD-10-CM | POA: Diagnosis not present

## 2021-04-02 DIAGNOSIS — Z992 Dependence on renal dialysis: Secondary | ICD-10-CM | POA: Diagnosis not present

## 2021-04-02 DIAGNOSIS — N186 End stage renal disease: Secondary | ICD-10-CM | POA: Diagnosis not present

## 2021-04-02 DIAGNOSIS — L299 Pruritus, unspecified: Secondary | ICD-10-CM | POA: Diagnosis not present

## 2021-04-02 DIAGNOSIS — D631 Anemia in chronic kidney disease: Secondary | ICD-10-CM | POA: Diagnosis not present

## 2021-04-04 DIAGNOSIS — Z992 Dependence on renal dialysis: Secondary | ICD-10-CM | POA: Diagnosis not present

## 2021-04-04 DIAGNOSIS — D631 Anemia in chronic kidney disease: Secondary | ICD-10-CM | POA: Diagnosis not present

## 2021-04-04 DIAGNOSIS — N2581 Secondary hyperparathyroidism of renal origin: Secondary | ICD-10-CM | POA: Diagnosis not present

## 2021-04-04 DIAGNOSIS — D689 Coagulation defect, unspecified: Secondary | ICD-10-CM | POA: Diagnosis not present

## 2021-04-04 DIAGNOSIS — L299 Pruritus, unspecified: Secondary | ICD-10-CM | POA: Diagnosis not present

## 2021-04-04 DIAGNOSIS — N186 End stage renal disease: Secondary | ICD-10-CM | POA: Diagnosis not present

## 2021-04-07 DIAGNOSIS — N186 End stage renal disease: Secondary | ICD-10-CM | POA: Diagnosis not present

## 2021-04-07 DIAGNOSIS — Z992 Dependence on renal dialysis: Secondary | ICD-10-CM | POA: Diagnosis not present

## 2021-04-07 DIAGNOSIS — D689 Coagulation defect, unspecified: Secondary | ICD-10-CM | POA: Diagnosis not present

## 2021-04-07 DIAGNOSIS — N2581 Secondary hyperparathyroidism of renal origin: Secondary | ICD-10-CM | POA: Diagnosis not present

## 2021-04-07 DIAGNOSIS — E875 Hyperkalemia: Secondary | ICD-10-CM | POA: Diagnosis not present

## 2021-04-07 DIAGNOSIS — D631 Anemia in chronic kidney disease: Secondary | ICD-10-CM | POA: Diagnosis not present

## 2021-04-09 DIAGNOSIS — N186 End stage renal disease: Secondary | ICD-10-CM | POA: Diagnosis not present

## 2021-04-09 DIAGNOSIS — E875 Hyperkalemia: Secondary | ICD-10-CM | POA: Diagnosis not present

## 2021-04-09 DIAGNOSIS — N2581 Secondary hyperparathyroidism of renal origin: Secondary | ICD-10-CM | POA: Diagnosis not present

## 2021-04-09 DIAGNOSIS — D689 Coagulation defect, unspecified: Secondary | ICD-10-CM | POA: Diagnosis not present

## 2021-04-09 DIAGNOSIS — D631 Anemia in chronic kidney disease: Secondary | ICD-10-CM | POA: Diagnosis not present

## 2021-04-09 DIAGNOSIS — Z992 Dependence on renal dialysis: Secondary | ICD-10-CM | POA: Diagnosis not present

## 2021-04-11 DIAGNOSIS — D631 Anemia in chronic kidney disease: Secondary | ICD-10-CM | POA: Diagnosis not present

## 2021-04-11 DIAGNOSIS — N186 End stage renal disease: Secondary | ICD-10-CM | POA: Diagnosis not present

## 2021-04-11 DIAGNOSIS — N2581 Secondary hyperparathyroidism of renal origin: Secondary | ICD-10-CM | POA: Diagnosis not present

## 2021-04-11 DIAGNOSIS — D689 Coagulation defect, unspecified: Secondary | ICD-10-CM | POA: Diagnosis not present

## 2021-04-11 DIAGNOSIS — E875 Hyperkalemia: Secondary | ICD-10-CM | POA: Diagnosis not present

## 2021-04-11 DIAGNOSIS — Z992 Dependence on renal dialysis: Secondary | ICD-10-CM | POA: Diagnosis not present

## 2021-04-12 DIAGNOSIS — N186 End stage renal disease: Secondary | ICD-10-CM | POA: Diagnosis not present

## 2021-04-12 DIAGNOSIS — T8610 Unspecified complication of kidney transplant: Secondary | ICD-10-CM | POA: Diagnosis not present

## 2021-04-12 DIAGNOSIS — Z992 Dependence on renal dialysis: Secondary | ICD-10-CM | POA: Diagnosis not present

## 2021-04-14 DIAGNOSIS — N2581 Secondary hyperparathyroidism of renal origin: Secondary | ICD-10-CM | POA: Diagnosis not present

## 2021-04-14 DIAGNOSIS — R197 Diarrhea, unspecified: Secondary | ICD-10-CM | POA: Diagnosis not present

## 2021-04-14 DIAGNOSIS — L299 Pruritus, unspecified: Secondary | ICD-10-CM | POA: Diagnosis not present

## 2021-04-14 DIAGNOSIS — Z992 Dependence on renal dialysis: Secondary | ICD-10-CM | POA: Diagnosis not present

## 2021-04-14 DIAGNOSIS — N186 End stage renal disease: Secondary | ICD-10-CM | POA: Diagnosis not present

## 2021-04-14 DIAGNOSIS — D689 Coagulation defect, unspecified: Secondary | ICD-10-CM | POA: Diagnosis not present

## 2021-04-16 DIAGNOSIS — N2581 Secondary hyperparathyroidism of renal origin: Secondary | ICD-10-CM | POA: Diagnosis not present

## 2021-04-16 DIAGNOSIS — L299 Pruritus, unspecified: Secondary | ICD-10-CM | POA: Diagnosis not present

## 2021-04-16 DIAGNOSIS — D689 Coagulation defect, unspecified: Secondary | ICD-10-CM | POA: Diagnosis not present

## 2021-04-16 DIAGNOSIS — N186 End stage renal disease: Secondary | ICD-10-CM | POA: Diagnosis not present

## 2021-04-16 DIAGNOSIS — R197 Diarrhea, unspecified: Secondary | ICD-10-CM | POA: Diagnosis not present

## 2021-04-16 DIAGNOSIS — Z992 Dependence on renal dialysis: Secondary | ICD-10-CM | POA: Diagnosis not present

## 2021-04-18 DIAGNOSIS — N2581 Secondary hyperparathyroidism of renal origin: Secondary | ICD-10-CM | POA: Diagnosis not present

## 2021-04-18 DIAGNOSIS — Z992 Dependence on renal dialysis: Secondary | ICD-10-CM | POA: Diagnosis not present

## 2021-04-18 DIAGNOSIS — N186 End stage renal disease: Secondary | ICD-10-CM | POA: Diagnosis not present

## 2021-04-18 DIAGNOSIS — D689 Coagulation defect, unspecified: Secondary | ICD-10-CM | POA: Diagnosis not present

## 2021-04-18 DIAGNOSIS — R197 Diarrhea, unspecified: Secondary | ICD-10-CM | POA: Diagnosis not present

## 2021-04-18 DIAGNOSIS — L299 Pruritus, unspecified: Secondary | ICD-10-CM | POA: Diagnosis not present

## 2021-04-21 DIAGNOSIS — N186 End stage renal disease: Secondary | ICD-10-CM | POA: Diagnosis not present

## 2021-04-21 DIAGNOSIS — Z992 Dependence on renal dialysis: Secondary | ICD-10-CM | POA: Diagnosis not present

## 2021-04-21 DIAGNOSIS — D689 Coagulation defect, unspecified: Secondary | ICD-10-CM | POA: Diagnosis not present

## 2021-04-21 DIAGNOSIS — N2581 Secondary hyperparathyroidism of renal origin: Secondary | ICD-10-CM | POA: Diagnosis not present

## 2021-04-21 DIAGNOSIS — D631 Anemia in chronic kidney disease: Secondary | ICD-10-CM | POA: Diagnosis not present

## 2021-04-23 DIAGNOSIS — D689 Coagulation defect, unspecified: Secondary | ICD-10-CM | POA: Diagnosis not present

## 2021-04-23 DIAGNOSIS — D631 Anemia in chronic kidney disease: Secondary | ICD-10-CM | POA: Diagnosis not present

## 2021-04-23 DIAGNOSIS — N186 End stage renal disease: Secondary | ICD-10-CM | POA: Diagnosis not present

## 2021-04-23 DIAGNOSIS — N2581 Secondary hyperparathyroidism of renal origin: Secondary | ICD-10-CM | POA: Diagnosis not present

## 2021-04-23 DIAGNOSIS — Z992 Dependence on renal dialysis: Secondary | ICD-10-CM | POA: Diagnosis not present

## 2021-04-25 DIAGNOSIS — Z992 Dependence on renal dialysis: Secondary | ICD-10-CM | POA: Diagnosis not present

## 2021-04-25 DIAGNOSIS — N186 End stage renal disease: Secondary | ICD-10-CM | POA: Diagnosis not present

## 2021-04-25 DIAGNOSIS — N2581 Secondary hyperparathyroidism of renal origin: Secondary | ICD-10-CM | POA: Diagnosis not present

## 2021-04-25 DIAGNOSIS — D631 Anemia in chronic kidney disease: Secondary | ICD-10-CM | POA: Diagnosis not present

## 2021-04-25 DIAGNOSIS — D689 Coagulation defect, unspecified: Secondary | ICD-10-CM | POA: Diagnosis not present

## 2021-04-28 DIAGNOSIS — L299 Pruritus, unspecified: Secondary | ICD-10-CM | POA: Diagnosis not present

## 2021-04-28 DIAGNOSIS — N186 End stage renal disease: Secondary | ICD-10-CM | POA: Diagnosis not present

## 2021-04-28 DIAGNOSIS — N2581 Secondary hyperparathyroidism of renal origin: Secondary | ICD-10-CM | POA: Diagnosis not present

## 2021-04-28 DIAGNOSIS — Z992 Dependence on renal dialysis: Secondary | ICD-10-CM | POA: Diagnosis not present

## 2021-04-28 DIAGNOSIS — D689 Coagulation defect, unspecified: Secondary | ICD-10-CM | POA: Diagnosis not present

## 2021-04-28 DIAGNOSIS — D631 Anemia in chronic kidney disease: Secondary | ICD-10-CM | POA: Diagnosis not present

## 2021-04-30 DIAGNOSIS — N186 End stage renal disease: Secondary | ICD-10-CM | POA: Diagnosis not present

## 2021-04-30 DIAGNOSIS — L299 Pruritus, unspecified: Secondary | ICD-10-CM | POA: Diagnosis not present

## 2021-04-30 DIAGNOSIS — Z992 Dependence on renal dialysis: Secondary | ICD-10-CM | POA: Diagnosis not present

## 2021-04-30 DIAGNOSIS — D631 Anemia in chronic kidney disease: Secondary | ICD-10-CM | POA: Diagnosis not present

## 2021-04-30 DIAGNOSIS — D689 Coagulation defect, unspecified: Secondary | ICD-10-CM | POA: Diagnosis not present

## 2021-04-30 DIAGNOSIS — N2581 Secondary hyperparathyroidism of renal origin: Secondary | ICD-10-CM | POA: Diagnosis not present

## 2021-05-02 DIAGNOSIS — L299 Pruritus, unspecified: Secondary | ICD-10-CM | POA: Diagnosis not present

## 2021-05-02 DIAGNOSIS — D631 Anemia in chronic kidney disease: Secondary | ICD-10-CM | POA: Diagnosis not present

## 2021-05-02 DIAGNOSIS — N2581 Secondary hyperparathyroidism of renal origin: Secondary | ICD-10-CM | POA: Diagnosis not present

## 2021-05-02 DIAGNOSIS — D689 Coagulation defect, unspecified: Secondary | ICD-10-CM | POA: Diagnosis not present

## 2021-05-02 DIAGNOSIS — Z992 Dependence on renal dialysis: Secondary | ICD-10-CM | POA: Diagnosis not present

## 2021-05-02 DIAGNOSIS — N186 End stage renal disease: Secondary | ICD-10-CM | POA: Diagnosis not present

## 2021-05-05 DIAGNOSIS — N2581 Secondary hyperparathyroidism of renal origin: Secondary | ICD-10-CM | POA: Diagnosis not present

## 2021-05-05 DIAGNOSIS — D631 Anemia in chronic kidney disease: Secondary | ICD-10-CM | POA: Diagnosis not present

## 2021-05-05 DIAGNOSIS — D689 Coagulation defect, unspecified: Secondary | ICD-10-CM | POA: Diagnosis not present

## 2021-05-05 DIAGNOSIS — N186 End stage renal disease: Secondary | ICD-10-CM | POA: Diagnosis not present

## 2021-05-05 DIAGNOSIS — Z992 Dependence on renal dialysis: Secondary | ICD-10-CM | POA: Diagnosis not present

## 2021-05-07 DIAGNOSIS — N186 End stage renal disease: Secondary | ICD-10-CM | POA: Diagnosis not present

## 2021-05-07 DIAGNOSIS — D631 Anemia in chronic kidney disease: Secondary | ICD-10-CM | POA: Diagnosis not present

## 2021-05-07 DIAGNOSIS — N2581 Secondary hyperparathyroidism of renal origin: Secondary | ICD-10-CM | POA: Diagnosis not present

## 2021-05-07 DIAGNOSIS — Z992 Dependence on renal dialysis: Secondary | ICD-10-CM | POA: Diagnosis not present

## 2021-05-07 DIAGNOSIS — D689 Coagulation defect, unspecified: Secondary | ICD-10-CM | POA: Diagnosis not present

## 2021-05-09 DIAGNOSIS — N2581 Secondary hyperparathyroidism of renal origin: Secondary | ICD-10-CM | POA: Diagnosis not present

## 2021-05-09 DIAGNOSIS — D631 Anemia in chronic kidney disease: Secondary | ICD-10-CM | POA: Diagnosis not present

## 2021-05-09 DIAGNOSIS — N186 End stage renal disease: Secondary | ICD-10-CM | POA: Diagnosis not present

## 2021-05-09 DIAGNOSIS — Z992 Dependence on renal dialysis: Secondary | ICD-10-CM | POA: Diagnosis not present

## 2021-05-09 DIAGNOSIS — D689 Coagulation defect, unspecified: Secondary | ICD-10-CM | POA: Diagnosis not present

## 2021-05-12 DIAGNOSIS — N186 End stage renal disease: Secondary | ICD-10-CM | POA: Diagnosis not present

## 2021-05-12 DIAGNOSIS — Z992 Dependence on renal dialysis: Secondary | ICD-10-CM | POA: Diagnosis not present

## 2021-05-12 DIAGNOSIS — D689 Coagulation defect, unspecified: Secondary | ICD-10-CM | POA: Diagnosis not present

## 2021-05-12 DIAGNOSIS — N2581 Secondary hyperparathyroidism of renal origin: Secondary | ICD-10-CM | POA: Diagnosis not present

## 2021-05-13 DIAGNOSIS — Z992 Dependence on renal dialysis: Secondary | ICD-10-CM | POA: Diagnosis not present

## 2021-05-13 DIAGNOSIS — N186 End stage renal disease: Secondary | ICD-10-CM | POA: Diagnosis not present

## 2021-05-13 DIAGNOSIS — T8610 Unspecified complication of kidney transplant: Secondary | ICD-10-CM | POA: Diagnosis not present

## 2021-05-14 DIAGNOSIS — N186 End stage renal disease: Secondary | ICD-10-CM | POA: Diagnosis not present

## 2021-05-14 DIAGNOSIS — Z992 Dependence on renal dialysis: Secondary | ICD-10-CM | POA: Diagnosis not present

## 2021-05-14 DIAGNOSIS — L299 Pruritus, unspecified: Secondary | ICD-10-CM | POA: Diagnosis not present

## 2021-05-14 DIAGNOSIS — D689 Coagulation defect, unspecified: Secondary | ICD-10-CM | POA: Diagnosis not present

## 2021-05-14 DIAGNOSIS — N2581 Secondary hyperparathyroidism of renal origin: Secondary | ICD-10-CM | POA: Diagnosis not present

## 2021-05-16 DIAGNOSIS — L299 Pruritus, unspecified: Secondary | ICD-10-CM | POA: Diagnosis not present

## 2021-05-16 DIAGNOSIS — N186 End stage renal disease: Secondary | ICD-10-CM | POA: Diagnosis not present

## 2021-05-16 DIAGNOSIS — Z992 Dependence on renal dialysis: Secondary | ICD-10-CM | POA: Diagnosis not present

## 2021-05-16 DIAGNOSIS — D689 Coagulation defect, unspecified: Secondary | ICD-10-CM | POA: Diagnosis not present

## 2021-05-16 DIAGNOSIS — N2581 Secondary hyperparathyroidism of renal origin: Secondary | ICD-10-CM | POA: Diagnosis not present

## 2021-05-19 DIAGNOSIS — N2581 Secondary hyperparathyroidism of renal origin: Secondary | ICD-10-CM | POA: Diagnosis not present

## 2021-05-19 DIAGNOSIS — Z992 Dependence on renal dialysis: Secondary | ICD-10-CM | POA: Diagnosis not present

## 2021-05-19 DIAGNOSIS — D631 Anemia in chronic kidney disease: Secondary | ICD-10-CM | POA: Diagnosis not present

## 2021-05-19 DIAGNOSIS — D689 Coagulation defect, unspecified: Secondary | ICD-10-CM | POA: Diagnosis not present

## 2021-05-19 DIAGNOSIS — N186 End stage renal disease: Secondary | ICD-10-CM | POA: Diagnosis not present

## 2021-05-21 DIAGNOSIS — N186 End stage renal disease: Secondary | ICD-10-CM | POA: Diagnosis not present

## 2021-05-21 DIAGNOSIS — D631 Anemia in chronic kidney disease: Secondary | ICD-10-CM | POA: Diagnosis not present

## 2021-05-21 DIAGNOSIS — D689 Coagulation defect, unspecified: Secondary | ICD-10-CM | POA: Diagnosis not present

## 2021-05-21 DIAGNOSIS — N2581 Secondary hyperparathyroidism of renal origin: Secondary | ICD-10-CM | POA: Diagnosis not present

## 2021-05-21 DIAGNOSIS — Z992 Dependence on renal dialysis: Secondary | ICD-10-CM | POA: Diagnosis not present

## 2021-05-23 DIAGNOSIS — D631 Anemia in chronic kidney disease: Secondary | ICD-10-CM | POA: Diagnosis not present

## 2021-05-23 DIAGNOSIS — N186 End stage renal disease: Secondary | ICD-10-CM | POA: Diagnosis not present

## 2021-05-23 DIAGNOSIS — N2581 Secondary hyperparathyroidism of renal origin: Secondary | ICD-10-CM | POA: Diagnosis not present

## 2021-05-23 DIAGNOSIS — D689 Coagulation defect, unspecified: Secondary | ICD-10-CM | POA: Diagnosis not present

## 2021-05-23 DIAGNOSIS — Z992 Dependence on renal dialysis: Secondary | ICD-10-CM | POA: Diagnosis not present

## 2021-05-26 DIAGNOSIS — Z992 Dependence on renal dialysis: Secondary | ICD-10-CM | POA: Diagnosis not present

## 2021-05-26 DIAGNOSIS — D689 Coagulation defect, unspecified: Secondary | ICD-10-CM | POA: Diagnosis not present

## 2021-05-26 DIAGNOSIS — N2581 Secondary hyperparathyroidism of renal origin: Secondary | ICD-10-CM | POA: Diagnosis not present

## 2021-05-26 DIAGNOSIS — D631 Anemia in chronic kidney disease: Secondary | ICD-10-CM | POA: Diagnosis not present

## 2021-05-26 DIAGNOSIS — N186 End stage renal disease: Secondary | ICD-10-CM | POA: Diagnosis not present

## 2021-05-27 ENCOUNTER — Other Ambulatory Visit: Payer: Self-pay | Admitting: Nephrology

## 2021-05-27 DIAGNOSIS — N2889 Other specified disorders of kidney and ureter: Secondary | ICD-10-CM

## 2021-05-27 DIAGNOSIS — D751 Secondary polycythemia: Secondary | ICD-10-CM

## 2021-05-27 DIAGNOSIS — N186 End stage renal disease: Secondary | ICD-10-CM

## 2021-05-28 DIAGNOSIS — N2581 Secondary hyperparathyroidism of renal origin: Secondary | ICD-10-CM | POA: Diagnosis not present

## 2021-05-28 DIAGNOSIS — N186 End stage renal disease: Secondary | ICD-10-CM | POA: Diagnosis not present

## 2021-05-28 DIAGNOSIS — Z992 Dependence on renal dialysis: Secondary | ICD-10-CM | POA: Diagnosis not present

## 2021-05-28 DIAGNOSIS — D689 Coagulation defect, unspecified: Secondary | ICD-10-CM | POA: Diagnosis not present

## 2021-05-28 DIAGNOSIS — D631 Anemia in chronic kidney disease: Secondary | ICD-10-CM | POA: Diagnosis not present

## 2021-05-30 DIAGNOSIS — Z992 Dependence on renal dialysis: Secondary | ICD-10-CM | POA: Diagnosis not present

## 2021-05-30 DIAGNOSIS — N186 End stage renal disease: Secondary | ICD-10-CM | POA: Diagnosis not present

## 2021-05-30 DIAGNOSIS — D631 Anemia in chronic kidney disease: Secondary | ICD-10-CM | POA: Diagnosis not present

## 2021-05-30 DIAGNOSIS — N2581 Secondary hyperparathyroidism of renal origin: Secondary | ICD-10-CM | POA: Diagnosis not present

## 2021-05-30 DIAGNOSIS — D689 Coagulation defect, unspecified: Secondary | ICD-10-CM | POA: Diagnosis not present

## 2021-06-02 DIAGNOSIS — Z992 Dependence on renal dialysis: Secondary | ICD-10-CM | POA: Diagnosis not present

## 2021-06-02 DIAGNOSIS — N2581 Secondary hyperparathyroidism of renal origin: Secondary | ICD-10-CM | POA: Diagnosis not present

## 2021-06-02 DIAGNOSIS — N186 End stage renal disease: Secondary | ICD-10-CM | POA: Diagnosis not present

## 2021-06-02 DIAGNOSIS — D689 Coagulation defect, unspecified: Secondary | ICD-10-CM | POA: Diagnosis not present

## 2021-06-02 DIAGNOSIS — D631 Anemia in chronic kidney disease: Secondary | ICD-10-CM | POA: Diagnosis not present

## 2021-06-04 DIAGNOSIS — N186 End stage renal disease: Secondary | ICD-10-CM | POA: Diagnosis not present

## 2021-06-04 DIAGNOSIS — N2581 Secondary hyperparathyroidism of renal origin: Secondary | ICD-10-CM | POA: Diagnosis not present

## 2021-06-04 DIAGNOSIS — D689 Coagulation defect, unspecified: Secondary | ICD-10-CM | POA: Diagnosis not present

## 2021-06-04 DIAGNOSIS — Z992 Dependence on renal dialysis: Secondary | ICD-10-CM | POA: Diagnosis not present

## 2021-06-04 DIAGNOSIS — D631 Anemia in chronic kidney disease: Secondary | ICD-10-CM | POA: Diagnosis not present

## 2021-06-06 DIAGNOSIS — N186 End stage renal disease: Secondary | ICD-10-CM | POA: Diagnosis not present

## 2021-06-06 DIAGNOSIS — D689 Coagulation defect, unspecified: Secondary | ICD-10-CM | POA: Diagnosis not present

## 2021-06-06 DIAGNOSIS — Z992 Dependence on renal dialysis: Secondary | ICD-10-CM | POA: Diagnosis not present

## 2021-06-06 DIAGNOSIS — N2581 Secondary hyperparathyroidism of renal origin: Secondary | ICD-10-CM | POA: Diagnosis not present

## 2021-06-06 DIAGNOSIS — D631 Anemia in chronic kidney disease: Secondary | ICD-10-CM | POA: Diagnosis not present

## 2021-06-09 DIAGNOSIS — D631 Anemia in chronic kidney disease: Secondary | ICD-10-CM | POA: Diagnosis not present

## 2021-06-09 DIAGNOSIS — Z992 Dependence on renal dialysis: Secondary | ICD-10-CM | POA: Diagnosis not present

## 2021-06-09 DIAGNOSIS — N2581 Secondary hyperparathyroidism of renal origin: Secondary | ICD-10-CM | POA: Diagnosis not present

## 2021-06-09 DIAGNOSIS — N186 End stage renal disease: Secondary | ICD-10-CM | POA: Diagnosis not present

## 2021-06-09 DIAGNOSIS — D689 Coagulation defect, unspecified: Secondary | ICD-10-CM | POA: Diagnosis not present

## 2021-06-11 DIAGNOSIS — Z992 Dependence on renal dialysis: Secondary | ICD-10-CM | POA: Diagnosis not present

## 2021-06-11 DIAGNOSIS — N2581 Secondary hyperparathyroidism of renal origin: Secondary | ICD-10-CM | POA: Diagnosis not present

## 2021-06-11 DIAGNOSIS — D689 Coagulation defect, unspecified: Secondary | ICD-10-CM | POA: Diagnosis not present

## 2021-06-11 DIAGNOSIS — N186 End stage renal disease: Secondary | ICD-10-CM | POA: Diagnosis not present

## 2021-06-11 DIAGNOSIS — D631 Anemia in chronic kidney disease: Secondary | ICD-10-CM | POA: Diagnosis not present

## 2021-06-12 DIAGNOSIS — T8610 Unspecified complication of kidney transplant: Secondary | ICD-10-CM | POA: Diagnosis not present

## 2021-06-12 DIAGNOSIS — N186 End stage renal disease: Secondary | ICD-10-CM | POA: Diagnosis not present

## 2021-06-12 DIAGNOSIS — Z992 Dependence on renal dialysis: Secondary | ICD-10-CM | POA: Diagnosis not present

## 2021-06-13 DIAGNOSIS — L299 Pruritus, unspecified: Secondary | ICD-10-CM | POA: Diagnosis not present

## 2021-06-13 DIAGNOSIS — D631 Anemia in chronic kidney disease: Secondary | ICD-10-CM | POA: Diagnosis not present

## 2021-06-13 DIAGNOSIS — N186 End stage renal disease: Secondary | ICD-10-CM | POA: Diagnosis not present

## 2021-06-13 DIAGNOSIS — D689 Coagulation defect, unspecified: Secondary | ICD-10-CM | POA: Diagnosis not present

## 2021-06-13 DIAGNOSIS — Z992 Dependence on renal dialysis: Secondary | ICD-10-CM | POA: Diagnosis not present

## 2021-06-13 DIAGNOSIS — N2581 Secondary hyperparathyroidism of renal origin: Secondary | ICD-10-CM | POA: Diagnosis not present

## 2021-06-13 DIAGNOSIS — R519 Headache, unspecified: Secondary | ICD-10-CM | POA: Diagnosis not present

## 2021-06-16 DIAGNOSIS — R519 Headache, unspecified: Secondary | ICD-10-CM | POA: Diagnosis not present

## 2021-06-16 DIAGNOSIS — N2581 Secondary hyperparathyroidism of renal origin: Secondary | ICD-10-CM | POA: Diagnosis not present

## 2021-06-16 DIAGNOSIS — D689 Coagulation defect, unspecified: Secondary | ICD-10-CM | POA: Diagnosis not present

## 2021-06-16 DIAGNOSIS — D631 Anemia in chronic kidney disease: Secondary | ICD-10-CM | POA: Diagnosis not present

## 2021-06-16 DIAGNOSIS — Z992 Dependence on renal dialysis: Secondary | ICD-10-CM | POA: Diagnosis not present

## 2021-06-16 DIAGNOSIS — N186 End stage renal disease: Secondary | ICD-10-CM | POA: Diagnosis not present

## 2021-06-16 DIAGNOSIS — L299 Pruritus, unspecified: Secondary | ICD-10-CM | POA: Diagnosis not present

## 2021-06-18 DIAGNOSIS — D689 Coagulation defect, unspecified: Secondary | ICD-10-CM | POA: Diagnosis not present

## 2021-06-18 DIAGNOSIS — N186 End stage renal disease: Secondary | ICD-10-CM | POA: Diagnosis not present

## 2021-06-18 DIAGNOSIS — Z992 Dependence on renal dialysis: Secondary | ICD-10-CM | POA: Diagnosis not present

## 2021-06-18 DIAGNOSIS — L299 Pruritus, unspecified: Secondary | ICD-10-CM | POA: Diagnosis not present

## 2021-06-18 DIAGNOSIS — R519 Headache, unspecified: Secondary | ICD-10-CM | POA: Diagnosis not present

## 2021-06-18 DIAGNOSIS — D631 Anemia in chronic kidney disease: Secondary | ICD-10-CM | POA: Diagnosis not present

## 2021-06-18 DIAGNOSIS — N2581 Secondary hyperparathyroidism of renal origin: Secondary | ICD-10-CM | POA: Diagnosis not present

## 2021-06-19 ENCOUNTER — Ambulatory Visit
Admission: RE | Admit: 2021-06-19 | Discharge: 2021-06-19 | Disposition: A | Discharge status: Home / Self Care | Payer: Medicare Other | Source: Ambulatory Visit | Attending: Nephrology | Admitting: Nephrology

## 2021-06-19 DIAGNOSIS — N261 Atrophy of kidney (terminal): Secondary | ICD-10-CM | POA: Diagnosis not present

## 2021-06-19 DIAGNOSIS — N2889 Other specified disorders of kidney and ureter: Secondary | ICD-10-CM

## 2021-06-19 DIAGNOSIS — N186 End stage renal disease: Secondary | ICD-10-CM | POA: Diagnosis not present

## 2021-06-19 DIAGNOSIS — T8612 Kidney transplant failure: Secondary | ICD-10-CM | POA: Diagnosis not present

## 2021-06-19 DIAGNOSIS — D751 Secondary polycythemia: Secondary | ICD-10-CM

## 2021-06-19 DIAGNOSIS — N281 Cyst of kidney, acquired: Secondary | ICD-10-CM | POA: Diagnosis not present

## 2021-06-20 DIAGNOSIS — R519 Headache, unspecified: Secondary | ICD-10-CM | POA: Diagnosis not present

## 2021-06-20 DIAGNOSIS — L299 Pruritus, unspecified: Secondary | ICD-10-CM | POA: Diagnosis not present

## 2021-06-20 DIAGNOSIS — N2581 Secondary hyperparathyroidism of renal origin: Secondary | ICD-10-CM | POA: Diagnosis not present

## 2021-06-20 DIAGNOSIS — D631 Anemia in chronic kidney disease: Secondary | ICD-10-CM | POA: Diagnosis not present

## 2021-06-20 DIAGNOSIS — N186 End stage renal disease: Secondary | ICD-10-CM | POA: Diagnosis not present

## 2021-06-20 DIAGNOSIS — Z992 Dependence on renal dialysis: Secondary | ICD-10-CM | POA: Diagnosis not present

## 2021-06-20 DIAGNOSIS — D689 Coagulation defect, unspecified: Secondary | ICD-10-CM | POA: Diagnosis not present

## 2021-06-23 DIAGNOSIS — L299 Pruritus, unspecified: Secondary | ICD-10-CM | POA: Diagnosis not present

## 2021-06-23 DIAGNOSIS — N186 End stage renal disease: Secondary | ICD-10-CM | POA: Diagnosis not present

## 2021-06-23 DIAGNOSIS — D689 Coagulation defect, unspecified: Secondary | ICD-10-CM | POA: Diagnosis not present

## 2021-06-23 DIAGNOSIS — R519 Headache, unspecified: Secondary | ICD-10-CM | POA: Diagnosis not present

## 2021-06-23 DIAGNOSIS — N2581 Secondary hyperparathyroidism of renal origin: Secondary | ICD-10-CM | POA: Diagnosis not present

## 2021-06-23 DIAGNOSIS — Z992 Dependence on renal dialysis: Secondary | ICD-10-CM | POA: Diagnosis not present

## 2021-06-23 DIAGNOSIS — D631 Anemia in chronic kidney disease: Secondary | ICD-10-CM | POA: Diagnosis not present

## 2021-06-25 DIAGNOSIS — Z992 Dependence on renal dialysis: Secondary | ICD-10-CM | POA: Diagnosis not present

## 2021-06-25 DIAGNOSIS — R519 Headache, unspecified: Secondary | ICD-10-CM | POA: Diagnosis not present

## 2021-06-25 DIAGNOSIS — D689 Coagulation defect, unspecified: Secondary | ICD-10-CM | POA: Diagnosis not present

## 2021-06-25 DIAGNOSIS — N2581 Secondary hyperparathyroidism of renal origin: Secondary | ICD-10-CM | POA: Diagnosis not present

## 2021-06-25 DIAGNOSIS — N186 End stage renal disease: Secondary | ICD-10-CM | POA: Diagnosis not present

## 2021-06-25 DIAGNOSIS — L299 Pruritus, unspecified: Secondary | ICD-10-CM | POA: Diagnosis not present

## 2021-06-25 DIAGNOSIS — D631 Anemia in chronic kidney disease: Secondary | ICD-10-CM | POA: Diagnosis not present

## 2021-06-26 DIAGNOSIS — I129 Hypertensive chronic kidney disease with stage 1 through stage 4 chronic kidney disease, or unspecified chronic kidney disease: Secondary | ICD-10-CM | POA: Diagnosis not present

## 2021-06-26 DIAGNOSIS — J301 Allergic rhinitis due to pollen: Secondary | ICD-10-CM | POA: Diagnosis not present

## 2021-06-27 DIAGNOSIS — D689 Coagulation defect, unspecified: Secondary | ICD-10-CM | POA: Diagnosis not present

## 2021-06-27 DIAGNOSIS — R519 Headache, unspecified: Secondary | ICD-10-CM | POA: Diagnosis not present

## 2021-06-27 DIAGNOSIS — N2581 Secondary hyperparathyroidism of renal origin: Secondary | ICD-10-CM | POA: Diagnosis not present

## 2021-06-27 DIAGNOSIS — Z992 Dependence on renal dialysis: Secondary | ICD-10-CM | POA: Diagnosis not present

## 2021-06-27 DIAGNOSIS — L299 Pruritus, unspecified: Secondary | ICD-10-CM | POA: Diagnosis not present

## 2021-06-27 DIAGNOSIS — D631 Anemia in chronic kidney disease: Secondary | ICD-10-CM | POA: Diagnosis not present

## 2021-06-27 DIAGNOSIS — N186 End stage renal disease: Secondary | ICD-10-CM | POA: Diagnosis not present

## 2021-06-30 DIAGNOSIS — Z992 Dependence on renal dialysis: Secondary | ICD-10-CM | POA: Diagnosis not present

## 2021-06-30 DIAGNOSIS — D631 Anemia in chronic kidney disease: Secondary | ICD-10-CM | POA: Diagnosis not present

## 2021-06-30 DIAGNOSIS — N2581 Secondary hyperparathyroidism of renal origin: Secondary | ICD-10-CM | POA: Diagnosis not present

## 2021-06-30 DIAGNOSIS — R519 Headache, unspecified: Secondary | ICD-10-CM | POA: Diagnosis not present

## 2021-06-30 DIAGNOSIS — N186 End stage renal disease: Secondary | ICD-10-CM | POA: Diagnosis not present

## 2021-06-30 DIAGNOSIS — D689 Coagulation defect, unspecified: Secondary | ICD-10-CM | POA: Diagnosis not present

## 2021-06-30 DIAGNOSIS — L299 Pruritus, unspecified: Secondary | ICD-10-CM | POA: Diagnosis not present

## 2021-07-01 DIAGNOSIS — Z0000 Encounter for general adult medical examination without abnormal findings: Secondary | ICD-10-CM | POA: Diagnosis not present

## 2021-07-01 DIAGNOSIS — Z1152 Encounter for screening for COVID-19: Secondary | ICD-10-CM | POA: Diagnosis not present

## 2021-07-01 DIAGNOSIS — I129 Hypertensive chronic kidney disease with stage 1 through stage 4 chronic kidney disease, or unspecified chronic kidney disease: Secondary | ICD-10-CM | POA: Diagnosis not present

## 2021-07-02 DIAGNOSIS — D689 Coagulation defect, unspecified: Secondary | ICD-10-CM | POA: Diagnosis not present

## 2021-07-02 DIAGNOSIS — N186 End stage renal disease: Secondary | ICD-10-CM | POA: Diagnosis not present

## 2021-07-02 DIAGNOSIS — L299 Pruritus, unspecified: Secondary | ICD-10-CM | POA: Diagnosis not present

## 2021-07-02 DIAGNOSIS — D631 Anemia in chronic kidney disease: Secondary | ICD-10-CM | POA: Diagnosis not present

## 2021-07-02 DIAGNOSIS — R519 Headache, unspecified: Secondary | ICD-10-CM | POA: Diagnosis not present

## 2021-07-02 DIAGNOSIS — Z992 Dependence on renal dialysis: Secondary | ICD-10-CM | POA: Diagnosis not present

## 2021-07-02 DIAGNOSIS — N2581 Secondary hyperparathyroidism of renal origin: Secondary | ICD-10-CM | POA: Diagnosis not present

## 2021-07-04 DIAGNOSIS — L299 Pruritus, unspecified: Secondary | ICD-10-CM | POA: Diagnosis not present

## 2021-07-04 DIAGNOSIS — N2581 Secondary hyperparathyroidism of renal origin: Secondary | ICD-10-CM | POA: Diagnosis not present

## 2021-07-04 DIAGNOSIS — D689 Coagulation defect, unspecified: Secondary | ICD-10-CM | POA: Diagnosis not present

## 2021-07-04 DIAGNOSIS — D631 Anemia in chronic kidney disease: Secondary | ICD-10-CM | POA: Diagnosis not present

## 2021-07-04 DIAGNOSIS — R519 Headache, unspecified: Secondary | ICD-10-CM | POA: Diagnosis not present

## 2021-07-04 DIAGNOSIS — N186 End stage renal disease: Secondary | ICD-10-CM | POA: Diagnosis not present

## 2021-07-04 DIAGNOSIS — Z992 Dependence on renal dialysis: Secondary | ICD-10-CM | POA: Diagnosis not present

## 2021-07-07 DIAGNOSIS — N186 End stage renal disease: Secondary | ICD-10-CM | POA: Diagnosis not present

## 2021-07-07 DIAGNOSIS — L299 Pruritus, unspecified: Secondary | ICD-10-CM | POA: Diagnosis not present

## 2021-07-07 DIAGNOSIS — N2581 Secondary hyperparathyroidism of renal origin: Secondary | ICD-10-CM | POA: Diagnosis not present

## 2021-07-07 DIAGNOSIS — R519 Headache, unspecified: Secondary | ICD-10-CM | POA: Diagnosis not present

## 2021-07-07 DIAGNOSIS — Z992 Dependence on renal dialysis: Secondary | ICD-10-CM | POA: Diagnosis not present

## 2021-07-07 DIAGNOSIS — D689 Coagulation defect, unspecified: Secondary | ICD-10-CM | POA: Diagnosis not present

## 2021-07-07 DIAGNOSIS — D631 Anemia in chronic kidney disease: Secondary | ICD-10-CM | POA: Diagnosis not present

## 2021-07-09 DIAGNOSIS — D689 Coagulation defect, unspecified: Secondary | ICD-10-CM | POA: Diagnosis not present

## 2021-07-09 DIAGNOSIS — N2581 Secondary hyperparathyroidism of renal origin: Secondary | ICD-10-CM | POA: Diagnosis not present

## 2021-07-09 DIAGNOSIS — R519 Headache, unspecified: Secondary | ICD-10-CM | POA: Diagnosis not present

## 2021-07-09 DIAGNOSIS — Z992 Dependence on renal dialysis: Secondary | ICD-10-CM | POA: Diagnosis not present

## 2021-07-09 DIAGNOSIS — D631 Anemia in chronic kidney disease: Secondary | ICD-10-CM | POA: Diagnosis not present

## 2021-07-09 DIAGNOSIS — N186 End stage renal disease: Secondary | ICD-10-CM | POA: Diagnosis not present

## 2021-07-09 DIAGNOSIS — L299 Pruritus, unspecified: Secondary | ICD-10-CM | POA: Diagnosis not present

## 2021-07-11 DIAGNOSIS — Z992 Dependence on renal dialysis: Secondary | ICD-10-CM | POA: Diagnosis not present

## 2021-07-11 DIAGNOSIS — R519 Headache, unspecified: Secondary | ICD-10-CM | POA: Diagnosis not present

## 2021-07-11 DIAGNOSIS — L299 Pruritus, unspecified: Secondary | ICD-10-CM | POA: Diagnosis not present

## 2021-07-11 DIAGNOSIS — N2581 Secondary hyperparathyroidism of renal origin: Secondary | ICD-10-CM | POA: Diagnosis not present

## 2021-07-11 DIAGNOSIS — N186 End stage renal disease: Secondary | ICD-10-CM | POA: Diagnosis not present

## 2021-07-11 DIAGNOSIS — D689 Coagulation defect, unspecified: Secondary | ICD-10-CM | POA: Diagnosis not present

## 2021-07-11 DIAGNOSIS — D631 Anemia in chronic kidney disease: Secondary | ICD-10-CM | POA: Diagnosis not present

## 2021-07-13 DIAGNOSIS — N186 End stage renal disease: Secondary | ICD-10-CM | POA: Diagnosis not present

## 2021-07-13 DIAGNOSIS — T8610 Unspecified complication of kidney transplant: Secondary | ICD-10-CM | POA: Diagnosis not present

## 2021-07-13 DIAGNOSIS — Z992 Dependence on renal dialysis: Secondary | ICD-10-CM | POA: Diagnosis not present

## 2021-07-14 DIAGNOSIS — N2581 Secondary hyperparathyroidism of renal origin: Secondary | ICD-10-CM | POA: Diagnosis not present

## 2021-07-14 DIAGNOSIS — D689 Coagulation defect, unspecified: Secondary | ICD-10-CM | POA: Diagnosis not present

## 2021-07-14 DIAGNOSIS — D631 Anemia in chronic kidney disease: Secondary | ICD-10-CM | POA: Diagnosis not present

## 2021-07-14 DIAGNOSIS — N186 End stage renal disease: Secondary | ICD-10-CM | POA: Diagnosis not present

## 2021-07-14 DIAGNOSIS — Z992 Dependence on renal dialysis: Secondary | ICD-10-CM | POA: Diagnosis not present

## 2021-07-16 DIAGNOSIS — N186 End stage renal disease: Secondary | ICD-10-CM | POA: Diagnosis not present

## 2021-07-16 DIAGNOSIS — D631 Anemia in chronic kidney disease: Secondary | ICD-10-CM | POA: Diagnosis not present

## 2021-07-16 DIAGNOSIS — N2581 Secondary hyperparathyroidism of renal origin: Secondary | ICD-10-CM | POA: Diagnosis not present

## 2021-07-16 DIAGNOSIS — D689 Coagulation defect, unspecified: Secondary | ICD-10-CM | POA: Diagnosis not present

## 2021-07-16 DIAGNOSIS — Z992 Dependence on renal dialysis: Secondary | ICD-10-CM | POA: Diagnosis not present

## 2021-07-18 DIAGNOSIS — N2581 Secondary hyperparathyroidism of renal origin: Secondary | ICD-10-CM | POA: Diagnosis not present

## 2021-07-18 DIAGNOSIS — Z992 Dependence on renal dialysis: Secondary | ICD-10-CM | POA: Diagnosis not present

## 2021-07-18 DIAGNOSIS — N186 End stage renal disease: Secondary | ICD-10-CM | POA: Diagnosis not present

## 2021-07-18 DIAGNOSIS — D689 Coagulation defect, unspecified: Secondary | ICD-10-CM | POA: Diagnosis not present

## 2021-07-18 DIAGNOSIS — D631 Anemia in chronic kidney disease: Secondary | ICD-10-CM | POA: Diagnosis not present

## 2021-07-21 DIAGNOSIS — D689 Coagulation defect, unspecified: Secondary | ICD-10-CM | POA: Diagnosis not present

## 2021-07-21 DIAGNOSIS — D631 Anemia in chronic kidney disease: Secondary | ICD-10-CM | POA: Diagnosis not present

## 2021-07-21 DIAGNOSIS — Z992 Dependence on renal dialysis: Secondary | ICD-10-CM | POA: Diagnosis not present

## 2021-07-21 DIAGNOSIS — N186 End stage renal disease: Secondary | ICD-10-CM | POA: Diagnosis not present

## 2021-07-21 DIAGNOSIS — N2581 Secondary hyperparathyroidism of renal origin: Secondary | ICD-10-CM | POA: Diagnosis not present

## 2021-07-23 DIAGNOSIS — Z992 Dependence on renal dialysis: Secondary | ICD-10-CM | POA: Diagnosis not present

## 2021-07-23 DIAGNOSIS — N186 End stage renal disease: Secondary | ICD-10-CM | POA: Diagnosis not present

## 2021-07-23 DIAGNOSIS — D689 Coagulation defect, unspecified: Secondary | ICD-10-CM | POA: Diagnosis not present

## 2021-07-23 DIAGNOSIS — D631 Anemia in chronic kidney disease: Secondary | ICD-10-CM | POA: Diagnosis not present

## 2021-07-23 DIAGNOSIS — N2581 Secondary hyperparathyroidism of renal origin: Secondary | ICD-10-CM | POA: Diagnosis not present

## 2021-07-25 DIAGNOSIS — D631 Anemia in chronic kidney disease: Secondary | ICD-10-CM | POA: Diagnosis not present

## 2021-07-25 DIAGNOSIS — Z992 Dependence on renal dialysis: Secondary | ICD-10-CM | POA: Diagnosis not present

## 2021-07-25 DIAGNOSIS — D689 Coagulation defect, unspecified: Secondary | ICD-10-CM | POA: Diagnosis not present

## 2021-07-25 DIAGNOSIS — N2581 Secondary hyperparathyroidism of renal origin: Secondary | ICD-10-CM | POA: Diagnosis not present

## 2021-07-25 DIAGNOSIS — N186 End stage renal disease: Secondary | ICD-10-CM | POA: Diagnosis not present

## 2021-07-28 DIAGNOSIS — Z992 Dependence on renal dialysis: Secondary | ICD-10-CM | POA: Diagnosis not present

## 2021-07-28 DIAGNOSIS — D631 Anemia in chronic kidney disease: Secondary | ICD-10-CM | POA: Diagnosis not present

## 2021-07-28 DIAGNOSIS — N2581 Secondary hyperparathyroidism of renal origin: Secondary | ICD-10-CM | POA: Diagnosis not present

## 2021-07-28 DIAGNOSIS — D689 Coagulation defect, unspecified: Secondary | ICD-10-CM | POA: Diagnosis not present

## 2021-07-28 DIAGNOSIS — N186 End stage renal disease: Secondary | ICD-10-CM | POA: Diagnosis not present

## 2021-07-30 DIAGNOSIS — Z992 Dependence on renal dialysis: Secondary | ICD-10-CM | POA: Diagnosis not present

## 2021-07-30 DIAGNOSIS — D689 Coagulation defect, unspecified: Secondary | ICD-10-CM | POA: Diagnosis not present

## 2021-07-30 DIAGNOSIS — N2581 Secondary hyperparathyroidism of renal origin: Secondary | ICD-10-CM | POA: Diagnosis not present

## 2021-07-30 DIAGNOSIS — D631 Anemia in chronic kidney disease: Secondary | ICD-10-CM | POA: Diagnosis not present

## 2021-07-30 DIAGNOSIS — N186 End stage renal disease: Secondary | ICD-10-CM | POA: Diagnosis not present

## 2021-08-01 DIAGNOSIS — Z992 Dependence on renal dialysis: Secondary | ICD-10-CM | POA: Diagnosis not present

## 2021-08-01 DIAGNOSIS — D689 Coagulation defect, unspecified: Secondary | ICD-10-CM | POA: Diagnosis not present

## 2021-08-01 DIAGNOSIS — D631 Anemia in chronic kidney disease: Secondary | ICD-10-CM | POA: Diagnosis not present

## 2021-08-01 DIAGNOSIS — N2581 Secondary hyperparathyroidism of renal origin: Secondary | ICD-10-CM | POA: Diagnosis not present

## 2021-08-01 DIAGNOSIS — N186 End stage renal disease: Secondary | ICD-10-CM | POA: Diagnosis not present

## 2021-08-04 DIAGNOSIS — N2581 Secondary hyperparathyroidism of renal origin: Secondary | ICD-10-CM | POA: Diagnosis not present

## 2021-08-04 DIAGNOSIS — D631 Anemia in chronic kidney disease: Secondary | ICD-10-CM | POA: Diagnosis not present

## 2021-08-04 DIAGNOSIS — Z992 Dependence on renal dialysis: Secondary | ICD-10-CM | POA: Diagnosis not present

## 2021-08-04 DIAGNOSIS — D689 Coagulation defect, unspecified: Secondary | ICD-10-CM | POA: Diagnosis not present

## 2021-08-04 DIAGNOSIS — N186 End stage renal disease: Secondary | ICD-10-CM | POA: Diagnosis not present

## 2021-08-06 DIAGNOSIS — N186 End stage renal disease: Secondary | ICD-10-CM | POA: Diagnosis not present

## 2021-08-06 DIAGNOSIS — D631 Anemia in chronic kidney disease: Secondary | ICD-10-CM | POA: Diagnosis not present

## 2021-08-06 DIAGNOSIS — Z992 Dependence on renal dialysis: Secondary | ICD-10-CM | POA: Diagnosis not present

## 2021-08-06 DIAGNOSIS — D689 Coagulation defect, unspecified: Secondary | ICD-10-CM | POA: Diagnosis not present

## 2021-08-06 DIAGNOSIS — N2581 Secondary hyperparathyroidism of renal origin: Secondary | ICD-10-CM | POA: Diagnosis not present

## 2021-08-08 DIAGNOSIS — N186 End stage renal disease: Secondary | ICD-10-CM | POA: Diagnosis not present

## 2021-08-08 DIAGNOSIS — N2581 Secondary hyperparathyroidism of renal origin: Secondary | ICD-10-CM | POA: Diagnosis not present

## 2021-08-08 DIAGNOSIS — D631 Anemia in chronic kidney disease: Secondary | ICD-10-CM | POA: Diagnosis not present

## 2021-08-08 DIAGNOSIS — Z992 Dependence on renal dialysis: Secondary | ICD-10-CM | POA: Diagnosis not present

## 2021-08-08 DIAGNOSIS — D689 Coagulation defect, unspecified: Secondary | ICD-10-CM | POA: Diagnosis not present

## 2021-08-11 DIAGNOSIS — D631 Anemia in chronic kidney disease: Secondary | ICD-10-CM | POA: Diagnosis not present

## 2021-08-11 DIAGNOSIS — D689 Coagulation defect, unspecified: Secondary | ICD-10-CM | POA: Diagnosis not present

## 2021-08-11 DIAGNOSIS — N186 End stage renal disease: Secondary | ICD-10-CM | POA: Diagnosis not present

## 2021-08-11 DIAGNOSIS — Z992 Dependence on renal dialysis: Secondary | ICD-10-CM | POA: Diagnosis not present

## 2021-08-11 DIAGNOSIS — N2581 Secondary hyperparathyroidism of renal origin: Secondary | ICD-10-CM | POA: Diagnosis not present

## 2021-08-13 DIAGNOSIS — D631 Anemia in chronic kidney disease: Secondary | ICD-10-CM | POA: Diagnosis not present

## 2021-08-13 DIAGNOSIS — Z992 Dependence on renal dialysis: Secondary | ICD-10-CM | POA: Diagnosis not present

## 2021-08-13 DIAGNOSIS — T8610 Unspecified complication of kidney transplant: Secondary | ICD-10-CM | POA: Diagnosis not present

## 2021-08-13 DIAGNOSIS — N186 End stage renal disease: Secondary | ICD-10-CM | POA: Diagnosis not present

## 2021-08-13 DIAGNOSIS — N2581 Secondary hyperparathyroidism of renal origin: Secondary | ICD-10-CM | POA: Diagnosis not present

## 2021-08-13 DIAGNOSIS — D689 Coagulation defect, unspecified: Secondary | ICD-10-CM | POA: Diagnosis not present

## 2021-08-15 DIAGNOSIS — N2581 Secondary hyperparathyroidism of renal origin: Secondary | ICD-10-CM | POA: Diagnosis not present

## 2021-08-15 DIAGNOSIS — D689 Coagulation defect, unspecified: Secondary | ICD-10-CM | POA: Diagnosis not present

## 2021-08-15 DIAGNOSIS — D631 Anemia in chronic kidney disease: Secondary | ICD-10-CM | POA: Diagnosis not present

## 2021-08-15 DIAGNOSIS — Z992 Dependence on renal dialysis: Secondary | ICD-10-CM | POA: Diagnosis not present

## 2021-08-15 DIAGNOSIS — N186 End stage renal disease: Secondary | ICD-10-CM | POA: Diagnosis not present

## 2021-08-15 DIAGNOSIS — L299 Pruritus, unspecified: Secondary | ICD-10-CM | POA: Diagnosis not present

## 2021-08-18 DIAGNOSIS — Z992 Dependence on renal dialysis: Secondary | ICD-10-CM | POA: Diagnosis not present

## 2021-08-18 DIAGNOSIS — N2581 Secondary hyperparathyroidism of renal origin: Secondary | ICD-10-CM | POA: Diagnosis not present

## 2021-08-18 DIAGNOSIS — D689 Coagulation defect, unspecified: Secondary | ICD-10-CM | POA: Diagnosis not present

## 2021-08-18 DIAGNOSIS — N186 End stage renal disease: Secondary | ICD-10-CM | POA: Diagnosis not present

## 2021-08-18 DIAGNOSIS — L299 Pruritus, unspecified: Secondary | ICD-10-CM | POA: Diagnosis not present

## 2021-08-18 DIAGNOSIS — D631 Anemia in chronic kidney disease: Secondary | ICD-10-CM | POA: Diagnosis not present

## 2021-08-20 DIAGNOSIS — Z992 Dependence on renal dialysis: Secondary | ICD-10-CM | POA: Diagnosis not present

## 2021-08-20 DIAGNOSIS — N186 End stage renal disease: Secondary | ICD-10-CM | POA: Diagnosis not present

## 2021-08-20 DIAGNOSIS — D631 Anemia in chronic kidney disease: Secondary | ICD-10-CM | POA: Diagnosis not present

## 2021-08-20 DIAGNOSIS — D689 Coagulation defect, unspecified: Secondary | ICD-10-CM | POA: Diagnosis not present

## 2021-08-20 DIAGNOSIS — L299 Pruritus, unspecified: Secondary | ICD-10-CM | POA: Diagnosis not present

## 2021-08-20 DIAGNOSIS — N2581 Secondary hyperparathyroidism of renal origin: Secondary | ICD-10-CM | POA: Diagnosis not present

## 2021-08-22 DIAGNOSIS — N186 End stage renal disease: Secondary | ICD-10-CM | POA: Diagnosis not present

## 2021-08-22 DIAGNOSIS — D689 Coagulation defect, unspecified: Secondary | ICD-10-CM | POA: Diagnosis not present

## 2021-08-22 DIAGNOSIS — L299 Pruritus, unspecified: Secondary | ICD-10-CM | POA: Diagnosis not present

## 2021-08-22 DIAGNOSIS — N2581 Secondary hyperparathyroidism of renal origin: Secondary | ICD-10-CM | POA: Diagnosis not present

## 2021-08-22 DIAGNOSIS — D631 Anemia in chronic kidney disease: Secondary | ICD-10-CM | POA: Diagnosis not present

## 2021-08-22 DIAGNOSIS — Z992 Dependence on renal dialysis: Secondary | ICD-10-CM | POA: Diagnosis not present

## 2021-08-25 DIAGNOSIS — Z992 Dependence on renal dialysis: Secondary | ICD-10-CM | POA: Diagnosis not present

## 2021-08-25 DIAGNOSIS — N2581 Secondary hyperparathyroidism of renal origin: Secondary | ICD-10-CM | POA: Diagnosis not present

## 2021-08-25 DIAGNOSIS — D631 Anemia in chronic kidney disease: Secondary | ICD-10-CM | POA: Diagnosis not present

## 2021-08-25 DIAGNOSIS — L299 Pruritus, unspecified: Secondary | ICD-10-CM | POA: Diagnosis not present

## 2021-08-25 DIAGNOSIS — D689 Coagulation defect, unspecified: Secondary | ICD-10-CM | POA: Diagnosis not present

## 2021-08-25 DIAGNOSIS — N186 End stage renal disease: Secondary | ICD-10-CM | POA: Diagnosis not present

## 2021-08-27 DIAGNOSIS — D631 Anemia in chronic kidney disease: Secondary | ICD-10-CM | POA: Diagnosis not present

## 2021-08-27 DIAGNOSIS — D689 Coagulation defect, unspecified: Secondary | ICD-10-CM | POA: Diagnosis not present

## 2021-08-27 DIAGNOSIS — Z992 Dependence on renal dialysis: Secondary | ICD-10-CM | POA: Diagnosis not present

## 2021-08-27 DIAGNOSIS — N186 End stage renal disease: Secondary | ICD-10-CM | POA: Diagnosis not present

## 2021-08-27 DIAGNOSIS — L299 Pruritus, unspecified: Secondary | ICD-10-CM | POA: Diagnosis not present

## 2021-08-27 DIAGNOSIS — N2581 Secondary hyperparathyroidism of renal origin: Secondary | ICD-10-CM | POA: Diagnosis not present

## 2021-08-28 DIAGNOSIS — N186 End stage renal disease: Secondary | ICD-10-CM | POA: Diagnosis not present

## 2021-08-28 DIAGNOSIS — E785 Hyperlipidemia, unspecified: Secondary | ICD-10-CM | POA: Diagnosis not present

## 2021-08-28 DIAGNOSIS — Z0000 Encounter for general adult medical examination without abnormal findings: Secondary | ICD-10-CM | POA: Diagnosis not present

## 2021-08-28 DIAGNOSIS — Z992 Dependence on renal dialysis: Secondary | ICD-10-CM | POA: Diagnosis not present

## 2021-08-28 DIAGNOSIS — I120 Hypertensive chronic kidney disease with stage 5 chronic kidney disease or end stage renal disease: Secondary | ICD-10-CM | POA: Diagnosis not present

## 2021-08-28 DIAGNOSIS — B351 Tinea unguium: Secondary | ICD-10-CM | POA: Diagnosis not present

## 2021-08-29 DIAGNOSIS — D689 Coagulation defect, unspecified: Secondary | ICD-10-CM | POA: Diagnosis not present

## 2021-08-29 DIAGNOSIS — Z992 Dependence on renal dialysis: Secondary | ICD-10-CM | POA: Diagnosis not present

## 2021-08-29 DIAGNOSIS — N186 End stage renal disease: Secondary | ICD-10-CM | POA: Diagnosis not present

## 2021-08-29 DIAGNOSIS — L299 Pruritus, unspecified: Secondary | ICD-10-CM | POA: Diagnosis not present

## 2021-08-29 DIAGNOSIS — N2581 Secondary hyperparathyroidism of renal origin: Secondary | ICD-10-CM | POA: Diagnosis not present

## 2021-08-29 DIAGNOSIS — D631 Anemia in chronic kidney disease: Secondary | ICD-10-CM | POA: Diagnosis not present

## 2021-09-01 DIAGNOSIS — N2581 Secondary hyperparathyroidism of renal origin: Secondary | ICD-10-CM | POA: Diagnosis not present

## 2021-09-01 DIAGNOSIS — D689 Coagulation defect, unspecified: Secondary | ICD-10-CM | POA: Diagnosis not present

## 2021-09-01 DIAGNOSIS — L299 Pruritus, unspecified: Secondary | ICD-10-CM | POA: Diagnosis not present

## 2021-09-01 DIAGNOSIS — D631 Anemia in chronic kidney disease: Secondary | ICD-10-CM | POA: Diagnosis not present

## 2021-09-01 DIAGNOSIS — N186 End stage renal disease: Secondary | ICD-10-CM | POA: Diagnosis not present

## 2021-09-01 DIAGNOSIS — Z992 Dependence on renal dialysis: Secondary | ICD-10-CM | POA: Diagnosis not present

## 2021-09-03 DIAGNOSIS — Z992 Dependence on renal dialysis: Secondary | ICD-10-CM | POA: Diagnosis not present

## 2021-09-03 DIAGNOSIS — N186 End stage renal disease: Secondary | ICD-10-CM | POA: Diagnosis not present

## 2021-09-03 DIAGNOSIS — D689 Coagulation defect, unspecified: Secondary | ICD-10-CM | POA: Diagnosis not present

## 2021-09-03 DIAGNOSIS — D631 Anemia in chronic kidney disease: Secondary | ICD-10-CM | POA: Diagnosis not present

## 2021-09-03 DIAGNOSIS — L299 Pruritus, unspecified: Secondary | ICD-10-CM | POA: Diagnosis not present

## 2021-09-03 DIAGNOSIS — N2581 Secondary hyperparathyroidism of renal origin: Secondary | ICD-10-CM | POA: Diagnosis not present

## 2021-09-05 DIAGNOSIS — Z992 Dependence on renal dialysis: Secondary | ICD-10-CM | POA: Diagnosis not present

## 2021-09-05 DIAGNOSIS — L299 Pruritus, unspecified: Secondary | ICD-10-CM | POA: Diagnosis not present

## 2021-09-05 DIAGNOSIS — D631 Anemia in chronic kidney disease: Secondary | ICD-10-CM | POA: Diagnosis not present

## 2021-09-05 DIAGNOSIS — N2581 Secondary hyperparathyroidism of renal origin: Secondary | ICD-10-CM | POA: Diagnosis not present

## 2021-09-05 DIAGNOSIS — N186 End stage renal disease: Secondary | ICD-10-CM | POA: Diagnosis not present

## 2021-09-05 DIAGNOSIS — D689 Coagulation defect, unspecified: Secondary | ICD-10-CM | POA: Diagnosis not present

## 2021-09-08 DIAGNOSIS — D631 Anemia in chronic kidney disease: Secondary | ICD-10-CM | POA: Diagnosis not present

## 2021-09-08 DIAGNOSIS — L299 Pruritus, unspecified: Secondary | ICD-10-CM | POA: Diagnosis not present

## 2021-09-08 DIAGNOSIS — N186 End stage renal disease: Secondary | ICD-10-CM | POA: Diagnosis not present

## 2021-09-08 DIAGNOSIS — N2581 Secondary hyperparathyroidism of renal origin: Secondary | ICD-10-CM | POA: Diagnosis not present

## 2021-09-08 DIAGNOSIS — D689 Coagulation defect, unspecified: Secondary | ICD-10-CM | POA: Diagnosis not present

## 2021-09-08 DIAGNOSIS — Z992 Dependence on renal dialysis: Secondary | ICD-10-CM | POA: Diagnosis not present

## 2021-09-10 DIAGNOSIS — Z992 Dependence on renal dialysis: Secondary | ICD-10-CM | POA: Diagnosis not present

## 2021-09-10 DIAGNOSIS — N186 End stage renal disease: Secondary | ICD-10-CM | POA: Diagnosis not present

## 2021-09-10 DIAGNOSIS — D689 Coagulation defect, unspecified: Secondary | ICD-10-CM | POA: Diagnosis not present

## 2021-09-10 DIAGNOSIS — L299 Pruritus, unspecified: Secondary | ICD-10-CM | POA: Diagnosis not present

## 2021-09-10 DIAGNOSIS — D631 Anemia in chronic kidney disease: Secondary | ICD-10-CM | POA: Diagnosis not present

## 2021-09-10 DIAGNOSIS — N2581 Secondary hyperparathyroidism of renal origin: Secondary | ICD-10-CM | POA: Diagnosis not present

## 2021-09-12 DIAGNOSIS — Z992 Dependence on renal dialysis: Secondary | ICD-10-CM | POA: Diagnosis not present

## 2021-09-12 DIAGNOSIS — N186 End stage renal disease: Secondary | ICD-10-CM | POA: Diagnosis not present

## 2021-09-12 DIAGNOSIS — D631 Anemia in chronic kidney disease: Secondary | ICD-10-CM | POA: Diagnosis not present

## 2021-09-12 DIAGNOSIS — D689 Coagulation defect, unspecified: Secondary | ICD-10-CM | POA: Diagnosis not present

## 2021-09-12 DIAGNOSIS — N2581 Secondary hyperparathyroidism of renal origin: Secondary | ICD-10-CM | POA: Diagnosis not present

## 2021-09-12 DIAGNOSIS — T8610 Unspecified complication of kidney transplant: Secondary | ICD-10-CM | POA: Diagnosis not present

## 2021-09-12 DIAGNOSIS — L299 Pruritus, unspecified: Secondary | ICD-10-CM | POA: Diagnosis not present

## 2021-09-14 DIAGNOSIS — Z20822 Contact with and (suspected) exposure to covid-19: Secondary | ICD-10-CM | POA: Diagnosis not present

## 2021-09-15 DIAGNOSIS — R519 Headache, unspecified: Secondary | ICD-10-CM | POA: Diagnosis not present

## 2021-09-15 DIAGNOSIS — Z23 Encounter for immunization: Secondary | ICD-10-CM | POA: Diagnosis not present

## 2021-09-15 DIAGNOSIS — D631 Anemia in chronic kidney disease: Secondary | ICD-10-CM | POA: Diagnosis not present

## 2021-09-15 DIAGNOSIS — D689 Coagulation defect, unspecified: Secondary | ICD-10-CM | POA: Diagnosis not present

## 2021-09-15 DIAGNOSIS — N186 End stage renal disease: Secondary | ICD-10-CM | POA: Diagnosis not present

## 2021-09-15 DIAGNOSIS — L299 Pruritus, unspecified: Secondary | ICD-10-CM | POA: Diagnosis not present

## 2021-09-15 DIAGNOSIS — Z992 Dependence on renal dialysis: Secondary | ICD-10-CM | POA: Diagnosis not present

## 2021-09-15 DIAGNOSIS — N2581 Secondary hyperparathyroidism of renal origin: Secondary | ICD-10-CM | POA: Diagnosis not present

## 2021-09-17 DIAGNOSIS — L299 Pruritus, unspecified: Secondary | ICD-10-CM | POA: Diagnosis not present

## 2021-09-17 DIAGNOSIS — D631 Anemia in chronic kidney disease: Secondary | ICD-10-CM | POA: Diagnosis not present

## 2021-09-17 DIAGNOSIS — Z992 Dependence on renal dialysis: Secondary | ICD-10-CM | POA: Diagnosis not present

## 2021-09-17 DIAGNOSIS — R519 Headache, unspecified: Secondary | ICD-10-CM | POA: Diagnosis not present

## 2021-09-17 DIAGNOSIS — Z23 Encounter for immunization: Secondary | ICD-10-CM | POA: Diagnosis not present

## 2021-09-17 DIAGNOSIS — N186 End stage renal disease: Secondary | ICD-10-CM | POA: Diagnosis not present

## 2021-09-17 DIAGNOSIS — D689 Coagulation defect, unspecified: Secondary | ICD-10-CM | POA: Diagnosis not present

## 2021-09-17 DIAGNOSIS — N2581 Secondary hyperparathyroidism of renal origin: Secondary | ICD-10-CM | POA: Diagnosis not present

## 2021-09-19 DIAGNOSIS — Z992 Dependence on renal dialysis: Secondary | ICD-10-CM | POA: Diagnosis not present

## 2021-09-19 DIAGNOSIS — D689 Coagulation defect, unspecified: Secondary | ICD-10-CM | POA: Diagnosis not present

## 2021-09-19 DIAGNOSIS — L299 Pruritus, unspecified: Secondary | ICD-10-CM | POA: Diagnosis not present

## 2021-09-19 DIAGNOSIS — N2581 Secondary hyperparathyroidism of renal origin: Secondary | ICD-10-CM | POA: Diagnosis not present

## 2021-09-19 DIAGNOSIS — N186 End stage renal disease: Secondary | ICD-10-CM | POA: Diagnosis not present

## 2021-09-19 DIAGNOSIS — D631 Anemia in chronic kidney disease: Secondary | ICD-10-CM | POA: Diagnosis not present

## 2021-09-19 DIAGNOSIS — R519 Headache, unspecified: Secondary | ICD-10-CM | POA: Diagnosis not present

## 2021-09-19 DIAGNOSIS — Z23 Encounter for immunization: Secondary | ICD-10-CM | POA: Diagnosis not present

## 2021-09-22 DIAGNOSIS — D689 Coagulation defect, unspecified: Secondary | ICD-10-CM | POA: Diagnosis not present

## 2021-09-22 DIAGNOSIS — N186 End stage renal disease: Secondary | ICD-10-CM | POA: Diagnosis not present

## 2021-09-22 DIAGNOSIS — D631 Anemia in chronic kidney disease: Secondary | ICD-10-CM | POA: Diagnosis not present

## 2021-09-22 DIAGNOSIS — N2581 Secondary hyperparathyroidism of renal origin: Secondary | ICD-10-CM | POA: Diagnosis not present

## 2021-09-22 DIAGNOSIS — R519 Headache, unspecified: Secondary | ICD-10-CM | POA: Diagnosis not present

## 2021-09-22 DIAGNOSIS — Z23 Encounter for immunization: Secondary | ICD-10-CM | POA: Diagnosis not present

## 2021-09-22 DIAGNOSIS — L299 Pruritus, unspecified: Secondary | ICD-10-CM | POA: Diagnosis not present

## 2021-09-22 DIAGNOSIS — Z992 Dependence on renal dialysis: Secondary | ICD-10-CM | POA: Diagnosis not present

## 2021-09-24 DIAGNOSIS — Z23 Encounter for immunization: Secondary | ICD-10-CM | POA: Diagnosis not present

## 2021-09-24 DIAGNOSIS — N2581 Secondary hyperparathyroidism of renal origin: Secondary | ICD-10-CM | POA: Diagnosis not present

## 2021-09-24 DIAGNOSIS — R519 Headache, unspecified: Secondary | ICD-10-CM | POA: Diagnosis not present

## 2021-09-24 DIAGNOSIS — D631 Anemia in chronic kidney disease: Secondary | ICD-10-CM | POA: Diagnosis not present

## 2021-09-24 DIAGNOSIS — Z992 Dependence on renal dialysis: Secondary | ICD-10-CM | POA: Diagnosis not present

## 2021-09-24 DIAGNOSIS — L299 Pruritus, unspecified: Secondary | ICD-10-CM | POA: Diagnosis not present

## 2021-09-24 DIAGNOSIS — N186 End stage renal disease: Secondary | ICD-10-CM | POA: Diagnosis not present

## 2021-09-24 DIAGNOSIS — D689 Coagulation defect, unspecified: Secondary | ICD-10-CM | POA: Diagnosis not present

## 2021-09-26 DIAGNOSIS — L299 Pruritus, unspecified: Secondary | ICD-10-CM | POA: Diagnosis not present

## 2021-09-26 DIAGNOSIS — R519 Headache, unspecified: Secondary | ICD-10-CM | POA: Diagnosis not present

## 2021-09-26 DIAGNOSIS — Z23 Encounter for immunization: Secondary | ICD-10-CM | POA: Diagnosis not present

## 2021-09-26 DIAGNOSIS — N186 End stage renal disease: Secondary | ICD-10-CM | POA: Diagnosis not present

## 2021-09-26 DIAGNOSIS — N2581 Secondary hyperparathyroidism of renal origin: Secondary | ICD-10-CM | POA: Diagnosis not present

## 2021-09-26 DIAGNOSIS — D631 Anemia in chronic kidney disease: Secondary | ICD-10-CM | POA: Diagnosis not present

## 2021-09-26 DIAGNOSIS — Z992 Dependence on renal dialysis: Secondary | ICD-10-CM | POA: Diagnosis not present

## 2021-09-26 DIAGNOSIS — D689 Coagulation defect, unspecified: Secondary | ICD-10-CM | POA: Diagnosis not present

## 2021-09-29 DIAGNOSIS — L299 Pruritus, unspecified: Secondary | ICD-10-CM | POA: Diagnosis not present

## 2021-09-29 DIAGNOSIS — N186 End stage renal disease: Secondary | ICD-10-CM | POA: Diagnosis not present

## 2021-09-29 DIAGNOSIS — Z23 Encounter for immunization: Secondary | ICD-10-CM | POA: Diagnosis not present

## 2021-09-29 DIAGNOSIS — Z992 Dependence on renal dialysis: Secondary | ICD-10-CM | POA: Diagnosis not present

## 2021-09-29 DIAGNOSIS — D689 Coagulation defect, unspecified: Secondary | ICD-10-CM | POA: Diagnosis not present

## 2021-09-29 DIAGNOSIS — N2581 Secondary hyperparathyroidism of renal origin: Secondary | ICD-10-CM | POA: Diagnosis not present

## 2021-09-29 DIAGNOSIS — D631 Anemia in chronic kidney disease: Secondary | ICD-10-CM | POA: Diagnosis not present

## 2021-09-29 DIAGNOSIS — R519 Headache, unspecified: Secondary | ICD-10-CM | POA: Diagnosis not present

## 2021-10-01 DIAGNOSIS — R519 Headache, unspecified: Secondary | ICD-10-CM | POA: Diagnosis not present

## 2021-10-01 DIAGNOSIS — D689 Coagulation defect, unspecified: Secondary | ICD-10-CM | POA: Diagnosis not present

## 2021-10-01 DIAGNOSIS — D631 Anemia in chronic kidney disease: Secondary | ICD-10-CM | POA: Diagnosis not present

## 2021-10-01 DIAGNOSIS — N2581 Secondary hyperparathyroidism of renal origin: Secondary | ICD-10-CM | POA: Diagnosis not present

## 2021-10-01 DIAGNOSIS — L299 Pruritus, unspecified: Secondary | ICD-10-CM | POA: Diagnosis not present

## 2021-10-01 DIAGNOSIS — Z23 Encounter for immunization: Secondary | ICD-10-CM | POA: Diagnosis not present

## 2021-10-01 DIAGNOSIS — N186 End stage renal disease: Secondary | ICD-10-CM | POA: Diagnosis not present

## 2021-10-01 DIAGNOSIS — Z992 Dependence on renal dialysis: Secondary | ICD-10-CM | POA: Diagnosis not present

## 2021-10-03 DIAGNOSIS — Z992 Dependence on renal dialysis: Secondary | ICD-10-CM | POA: Diagnosis not present

## 2021-10-03 DIAGNOSIS — N186 End stage renal disease: Secondary | ICD-10-CM | POA: Diagnosis not present

## 2021-10-03 DIAGNOSIS — D689 Coagulation defect, unspecified: Secondary | ICD-10-CM | POA: Diagnosis not present

## 2021-10-03 DIAGNOSIS — L299 Pruritus, unspecified: Secondary | ICD-10-CM | POA: Diagnosis not present

## 2021-10-03 DIAGNOSIS — D631 Anemia in chronic kidney disease: Secondary | ICD-10-CM | POA: Diagnosis not present

## 2021-10-03 DIAGNOSIS — R519 Headache, unspecified: Secondary | ICD-10-CM | POA: Diagnosis not present

## 2021-10-03 DIAGNOSIS — Z23 Encounter for immunization: Secondary | ICD-10-CM | POA: Diagnosis not present

## 2021-10-03 DIAGNOSIS — N2581 Secondary hyperparathyroidism of renal origin: Secondary | ICD-10-CM | POA: Diagnosis not present

## 2021-10-06 DIAGNOSIS — Z992 Dependence on renal dialysis: Secondary | ICD-10-CM | POA: Diagnosis not present

## 2021-10-06 DIAGNOSIS — D689 Coagulation defect, unspecified: Secondary | ICD-10-CM | POA: Diagnosis not present

## 2021-10-06 DIAGNOSIS — Z23 Encounter for immunization: Secondary | ICD-10-CM | POA: Diagnosis not present

## 2021-10-06 DIAGNOSIS — N2581 Secondary hyperparathyroidism of renal origin: Secondary | ICD-10-CM | POA: Diagnosis not present

## 2021-10-06 DIAGNOSIS — N186 End stage renal disease: Secondary | ICD-10-CM | POA: Diagnosis not present

## 2021-10-06 DIAGNOSIS — D631 Anemia in chronic kidney disease: Secondary | ICD-10-CM | POA: Diagnosis not present

## 2021-10-06 DIAGNOSIS — R519 Headache, unspecified: Secondary | ICD-10-CM | POA: Diagnosis not present

## 2021-10-06 DIAGNOSIS — L299 Pruritus, unspecified: Secondary | ICD-10-CM | POA: Diagnosis not present

## 2021-10-08 DIAGNOSIS — Z23 Encounter for immunization: Secondary | ICD-10-CM | POA: Diagnosis not present

## 2021-10-08 DIAGNOSIS — N2581 Secondary hyperparathyroidism of renal origin: Secondary | ICD-10-CM | POA: Diagnosis not present

## 2021-10-08 DIAGNOSIS — N186 End stage renal disease: Secondary | ICD-10-CM | POA: Diagnosis not present

## 2021-10-08 DIAGNOSIS — D631 Anemia in chronic kidney disease: Secondary | ICD-10-CM | POA: Diagnosis not present

## 2021-10-08 DIAGNOSIS — Z992 Dependence on renal dialysis: Secondary | ICD-10-CM | POA: Diagnosis not present

## 2021-10-08 DIAGNOSIS — L299 Pruritus, unspecified: Secondary | ICD-10-CM | POA: Diagnosis not present

## 2021-10-08 DIAGNOSIS — R519 Headache, unspecified: Secondary | ICD-10-CM | POA: Diagnosis not present

## 2021-10-08 DIAGNOSIS — D689 Coagulation defect, unspecified: Secondary | ICD-10-CM | POA: Diagnosis not present

## 2021-10-10 DIAGNOSIS — Z992 Dependence on renal dialysis: Secondary | ICD-10-CM | POA: Diagnosis not present

## 2021-10-10 DIAGNOSIS — N2581 Secondary hyperparathyroidism of renal origin: Secondary | ICD-10-CM | POA: Diagnosis not present

## 2021-10-10 DIAGNOSIS — L299 Pruritus, unspecified: Secondary | ICD-10-CM | POA: Diagnosis not present

## 2021-10-10 DIAGNOSIS — Z23 Encounter for immunization: Secondary | ICD-10-CM | POA: Diagnosis not present

## 2021-10-10 DIAGNOSIS — D631 Anemia in chronic kidney disease: Secondary | ICD-10-CM | POA: Diagnosis not present

## 2021-10-10 DIAGNOSIS — D689 Coagulation defect, unspecified: Secondary | ICD-10-CM | POA: Diagnosis not present

## 2021-10-10 DIAGNOSIS — R519 Headache, unspecified: Secondary | ICD-10-CM | POA: Diagnosis not present

## 2021-10-10 DIAGNOSIS — N186 End stage renal disease: Secondary | ICD-10-CM | POA: Diagnosis not present

## 2021-10-13 DIAGNOSIS — D631 Anemia in chronic kidney disease: Secondary | ICD-10-CM | POA: Diagnosis not present

## 2021-10-13 DIAGNOSIS — R519 Headache, unspecified: Secondary | ICD-10-CM | POA: Diagnosis not present

## 2021-10-13 DIAGNOSIS — Z992 Dependence on renal dialysis: Secondary | ICD-10-CM | POA: Diagnosis not present

## 2021-10-13 DIAGNOSIS — D689 Coagulation defect, unspecified: Secondary | ICD-10-CM | POA: Diagnosis not present

## 2021-10-13 DIAGNOSIS — N186 End stage renal disease: Secondary | ICD-10-CM | POA: Diagnosis not present

## 2021-10-13 DIAGNOSIS — Z23 Encounter for immunization: Secondary | ICD-10-CM | POA: Diagnosis not present

## 2021-10-13 DIAGNOSIS — T8610 Unspecified complication of kidney transplant: Secondary | ICD-10-CM | POA: Diagnosis not present

## 2021-10-13 DIAGNOSIS — N2581 Secondary hyperparathyroidism of renal origin: Secondary | ICD-10-CM | POA: Diagnosis not present

## 2021-10-13 DIAGNOSIS — L299 Pruritus, unspecified: Secondary | ICD-10-CM | POA: Diagnosis not present

## 2021-10-15 DIAGNOSIS — N186 End stage renal disease: Secondary | ICD-10-CM | POA: Diagnosis not present

## 2021-10-15 DIAGNOSIS — D689 Coagulation defect, unspecified: Secondary | ICD-10-CM | POA: Diagnosis not present

## 2021-10-15 DIAGNOSIS — Z992 Dependence on renal dialysis: Secondary | ICD-10-CM | POA: Diagnosis not present

## 2021-10-15 DIAGNOSIS — D631 Anemia in chronic kidney disease: Secondary | ICD-10-CM | POA: Diagnosis not present

## 2021-10-15 DIAGNOSIS — N2581 Secondary hyperparathyroidism of renal origin: Secondary | ICD-10-CM | POA: Diagnosis not present

## 2021-10-15 DIAGNOSIS — R197 Diarrhea, unspecified: Secondary | ICD-10-CM | POA: Diagnosis not present

## 2021-10-17 DIAGNOSIS — N2581 Secondary hyperparathyroidism of renal origin: Secondary | ICD-10-CM | POA: Diagnosis not present

## 2021-10-17 DIAGNOSIS — R197 Diarrhea, unspecified: Secondary | ICD-10-CM | POA: Diagnosis not present

## 2021-10-17 DIAGNOSIS — D631 Anemia in chronic kidney disease: Secondary | ICD-10-CM | POA: Diagnosis not present

## 2021-10-17 DIAGNOSIS — N186 End stage renal disease: Secondary | ICD-10-CM | POA: Diagnosis not present

## 2021-10-17 DIAGNOSIS — D689 Coagulation defect, unspecified: Secondary | ICD-10-CM | POA: Diagnosis not present

## 2021-10-17 DIAGNOSIS — Z992 Dependence on renal dialysis: Secondary | ICD-10-CM | POA: Diagnosis not present

## 2021-10-20 DIAGNOSIS — D631 Anemia in chronic kidney disease: Secondary | ICD-10-CM | POA: Diagnosis not present

## 2021-10-20 DIAGNOSIS — R197 Diarrhea, unspecified: Secondary | ICD-10-CM | POA: Diagnosis not present

## 2021-10-20 DIAGNOSIS — N186 End stage renal disease: Secondary | ICD-10-CM | POA: Diagnosis not present

## 2021-10-20 DIAGNOSIS — D689 Coagulation defect, unspecified: Secondary | ICD-10-CM | POA: Diagnosis not present

## 2021-10-20 DIAGNOSIS — Z992 Dependence on renal dialysis: Secondary | ICD-10-CM | POA: Diagnosis not present

## 2021-10-20 DIAGNOSIS — N2581 Secondary hyperparathyroidism of renal origin: Secondary | ICD-10-CM | POA: Diagnosis not present

## 2021-10-22 DIAGNOSIS — Z992 Dependence on renal dialysis: Secondary | ICD-10-CM | POA: Diagnosis not present

## 2021-10-22 DIAGNOSIS — R197 Diarrhea, unspecified: Secondary | ICD-10-CM | POA: Diagnosis not present

## 2021-10-22 DIAGNOSIS — D631 Anemia in chronic kidney disease: Secondary | ICD-10-CM | POA: Diagnosis not present

## 2021-10-22 DIAGNOSIS — N2581 Secondary hyperparathyroidism of renal origin: Secondary | ICD-10-CM | POA: Diagnosis not present

## 2021-10-22 DIAGNOSIS — D689 Coagulation defect, unspecified: Secondary | ICD-10-CM | POA: Diagnosis not present

## 2021-10-22 DIAGNOSIS — N186 End stage renal disease: Secondary | ICD-10-CM | POA: Diagnosis not present

## 2021-10-23 DIAGNOSIS — N186 End stage renal disease: Secondary | ICD-10-CM | POA: Diagnosis not present

## 2021-10-23 DIAGNOSIS — I120 Hypertensive chronic kidney disease with stage 5 chronic kidney disease or end stage renal disease: Secondary | ICD-10-CM | POA: Diagnosis not present

## 2021-10-23 DIAGNOSIS — Z992 Dependence on renal dialysis: Secondary | ICD-10-CM | POA: Diagnosis not present

## 2021-10-23 DIAGNOSIS — B351 Tinea unguium: Secondary | ICD-10-CM | POA: Diagnosis not present

## 2021-10-24 DIAGNOSIS — R197 Diarrhea, unspecified: Secondary | ICD-10-CM | POA: Diagnosis not present

## 2021-10-24 DIAGNOSIS — N186 End stage renal disease: Secondary | ICD-10-CM | POA: Diagnosis not present

## 2021-10-24 DIAGNOSIS — Z992 Dependence on renal dialysis: Secondary | ICD-10-CM | POA: Diagnosis not present

## 2021-10-24 DIAGNOSIS — D689 Coagulation defect, unspecified: Secondary | ICD-10-CM | POA: Diagnosis not present

## 2021-10-24 DIAGNOSIS — D631 Anemia in chronic kidney disease: Secondary | ICD-10-CM | POA: Diagnosis not present

## 2021-10-24 DIAGNOSIS — N2581 Secondary hyperparathyroidism of renal origin: Secondary | ICD-10-CM | POA: Diagnosis not present

## 2021-10-27 DIAGNOSIS — Z992 Dependence on renal dialysis: Secondary | ICD-10-CM | POA: Diagnosis not present

## 2021-10-27 DIAGNOSIS — N186 End stage renal disease: Secondary | ICD-10-CM | POA: Diagnosis not present

## 2021-10-27 DIAGNOSIS — N2581 Secondary hyperparathyroidism of renal origin: Secondary | ICD-10-CM | POA: Diagnosis not present

## 2021-10-27 DIAGNOSIS — D631 Anemia in chronic kidney disease: Secondary | ICD-10-CM | POA: Diagnosis not present

## 2021-10-27 DIAGNOSIS — D689 Coagulation defect, unspecified: Secondary | ICD-10-CM | POA: Diagnosis not present

## 2021-10-27 DIAGNOSIS — R197 Diarrhea, unspecified: Secondary | ICD-10-CM | POA: Diagnosis not present

## 2021-10-29 DIAGNOSIS — D689 Coagulation defect, unspecified: Secondary | ICD-10-CM | POA: Diagnosis not present

## 2021-10-29 DIAGNOSIS — N2581 Secondary hyperparathyroidism of renal origin: Secondary | ICD-10-CM | POA: Diagnosis not present

## 2021-10-29 DIAGNOSIS — D631 Anemia in chronic kidney disease: Secondary | ICD-10-CM | POA: Diagnosis not present

## 2021-10-29 DIAGNOSIS — N186 End stage renal disease: Secondary | ICD-10-CM | POA: Diagnosis not present

## 2021-10-29 DIAGNOSIS — Z992 Dependence on renal dialysis: Secondary | ICD-10-CM | POA: Diagnosis not present

## 2021-10-29 DIAGNOSIS — R197 Diarrhea, unspecified: Secondary | ICD-10-CM | POA: Diagnosis not present

## 2021-10-31 DIAGNOSIS — D689 Coagulation defect, unspecified: Secondary | ICD-10-CM | POA: Diagnosis not present

## 2021-10-31 DIAGNOSIS — N186 End stage renal disease: Secondary | ICD-10-CM | POA: Diagnosis not present

## 2021-10-31 DIAGNOSIS — R197 Diarrhea, unspecified: Secondary | ICD-10-CM | POA: Diagnosis not present

## 2021-10-31 DIAGNOSIS — N2581 Secondary hyperparathyroidism of renal origin: Secondary | ICD-10-CM | POA: Diagnosis not present

## 2021-10-31 DIAGNOSIS — D631 Anemia in chronic kidney disease: Secondary | ICD-10-CM | POA: Diagnosis not present

## 2021-10-31 DIAGNOSIS — Z992 Dependence on renal dialysis: Secondary | ICD-10-CM | POA: Diagnosis not present

## 2021-11-03 DIAGNOSIS — N2581 Secondary hyperparathyroidism of renal origin: Secondary | ICD-10-CM | POA: Diagnosis not present

## 2021-11-03 DIAGNOSIS — N186 End stage renal disease: Secondary | ICD-10-CM | POA: Diagnosis not present

## 2021-11-03 DIAGNOSIS — Z992 Dependence on renal dialysis: Secondary | ICD-10-CM | POA: Diagnosis not present

## 2021-11-03 DIAGNOSIS — D631 Anemia in chronic kidney disease: Secondary | ICD-10-CM | POA: Diagnosis not present

## 2021-11-03 DIAGNOSIS — R197 Diarrhea, unspecified: Secondary | ICD-10-CM | POA: Diagnosis not present

## 2021-11-03 DIAGNOSIS — D689 Coagulation defect, unspecified: Secondary | ICD-10-CM | POA: Diagnosis not present

## 2021-11-05 DIAGNOSIS — D631 Anemia in chronic kidney disease: Secondary | ICD-10-CM | POA: Diagnosis not present

## 2021-11-05 DIAGNOSIS — Z992 Dependence on renal dialysis: Secondary | ICD-10-CM | POA: Diagnosis not present

## 2021-11-05 DIAGNOSIS — N2581 Secondary hyperparathyroidism of renal origin: Secondary | ICD-10-CM | POA: Diagnosis not present

## 2021-11-05 DIAGNOSIS — N186 End stage renal disease: Secondary | ICD-10-CM | POA: Diagnosis not present

## 2021-11-05 DIAGNOSIS — R197 Diarrhea, unspecified: Secondary | ICD-10-CM | POA: Diagnosis not present

## 2021-11-05 DIAGNOSIS — D689 Coagulation defect, unspecified: Secondary | ICD-10-CM | POA: Diagnosis not present

## 2021-11-08 DIAGNOSIS — Z992 Dependence on renal dialysis: Secondary | ICD-10-CM | POA: Diagnosis not present

## 2021-11-08 DIAGNOSIS — D689 Coagulation defect, unspecified: Secondary | ICD-10-CM | POA: Diagnosis not present

## 2021-11-08 DIAGNOSIS — D631 Anemia in chronic kidney disease: Secondary | ICD-10-CM | POA: Diagnosis not present

## 2021-11-08 DIAGNOSIS — N186 End stage renal disease: Secondary | ICD-10-CM | POA: Diagnosis not present

## 2021-11-08 DIAGNOSIS — R197 Diarrhea, unspecified: Secondary | ICD-10-CM | POA: Diagnosis not present

## 2021-11-08 DIAGNOSIS — N2581 Secondary hyperparathyroidism of renal origin: Secondary | ICD-10-CM | POA: Diagnosis not present

## 2021-11-10 DIAGNOSIS — N2581 Secondary hyperparathyroidism of renal origin: Secondary | ICD-10-CM | POA: Diagnosis not present

## 2021-11-10 DIAGNOSIS — N186 End stage renal disease: Secondary | ICD-10-CM | POA: Diagnosis not present

## 2021-11-10 DIAGNOSIS — Z992 Dependence on renal dialysis: Secondary | ICD-10-CM | POA: Diagnosis not present

## 2021-11-10 DIAGNOSIS — D689 Coagulation defect, unspecified: Secondary | ICD-10-CM | POA: Diagnosis not present

## 2021-11-10 DIAGNOSIS — D631 Anemia in chronic kidney disease: Secondary | ICD-10-CM | POA: Diagnosis not present

## 2021-11-10 DIAGNOSIS — R197 Diarrhea, unspecified: Secondary | ICD-10-CM | POA: Diagnosis not present

## 2021-11-12 DIAGNOSIS — D689 Coagulation defect, unspecified: Secondary | ICD-10-CM | POA: Diagnosis not present

## 2021-11-12 DIAGNOSIS — R197 Diarrhea, unspecified: Secondary | ICD-10-CM | POA: Diagnosis not present

## 2021-11-12 DIAGNOSIS — N186 End stage renal disease: Secondary | ICD-10-CM | POA: Diagnosis not present

## 2021-11-12 DIAGNOSIS — T8610 Unspecified complication of kidney transplant: Secondary | ICD-10-CM | POA: Diagnosis not present

## 2021-11-12 DIAGNOSIS — N2581 Secondary hyperparathyroidism of renal origin: Secondary | ICD-10-CM | POA: Diagnosis not present

## 2021-11-12 DIAGNOSIS — Z992 Dependence on renal dialysis: Secondary | ICD-10-CM | POA: Diagnosis not present

## 2021-11-12 DIAGNOSIS — D631 Anemia in chronic kidney disease: Secondary | ICD-10-CM | POA: Diagnosis not present

## 2021-11-14 DIAGNOSIS — N186 End stage renal disease: Secondary | ICD-10-CM | POA: Diagnosis not present

## 2021-11-14 DIAGNOSIS — Z992 Dependence on renal dialysis: Secondary | ICD-10-CM | POA: Diagnosis not present

## 2021-11-14 DIAGNOSIS — D631 Anemia in chronic kidney disease: Secondary | ICD-10-CM | POA: Diagnosis not present

## 2021-11-14 DIAGNOSIS — Z5181 Encounter for therapeutic drug level monitoring: Secondary | ICD-10-CM | POA: Diagnosis not present

## 2021-11-14 DIAGNOSIS — L299 Pruritus, unspecified: Secondary | ICD-10-CM | POA: Diagnosis not present

## 2021-11-14 DIAGNOSIS — D689 Coagulation defect, unspecified: Secondary | ICD-10-CM | POA: Diagnosis not present

## 2021-11-14 DIAGNOSIS — N2581 Secondary hyperparathyroidism of renal origin: Secondary | ICD-10-CM | POA: Diagnosis not present

## 2021-11-17 DIAGNOSIS — D689 Coagulation defect, unspecified: Secondary | ICD-10-CM | POA: Diagnosis not present

## 2021-11-17 DIAGNOSIS — Z992 Dependence on renal dialysis: Secondary | ICD-10-CM | POA: Diagnosis not present

## 2021-11-17 DIAGNOSIS — N186 End stage renal disease: Secondary | ICD-10-CM | POA: Diagnosis not present

## 2021-11-17 DIAGNOSIS — D631 Anemia in chronic kidney disease: Secondary | ICD-10-CM | POA: Diagnosis not present

## 2021-11-17 DIAGNOSIS — N2581 Secondary hyperparathyroidism of renal origin: Secondary | ICD-10-CM | POA: Diagnosis not present

## 2021-11-17 DIAGNOSIS — L299 Pruritus, unspecified: Secondary | ICD-10-CM | POA: Diagnosis not present

## 2021-11-17 DIAGNOSIS — Z5181 Encounter for therapeutic drug level monitoring: Secondary | ICD-10-CM | POA: Diagnosis not present

## 2021-11-19 DIAGNOSIS — D689 Coagulation defect, unspecified: Secondary | ICD-10-CM | POA: Diagnosis not present

## 2021-11-19 DIAGNOSIS — N186 End stage renal disease: Secondary | ICD-10-CM | POA: Diagnosis not present

## 2021-11-19 DIAGNOSIS — N2581 Secondary hyperparathyroidism of renal origin: Secondary | ICD-10-CM | POA: Diagnosis not present

## 2021-11-19 DIAGNOSIS — Z992 Dependence on renal dialysis: Secondary | ICD-10-CM | POA: Diagnosis not present

## 2021-11-19 DIAGNOSIS — Z5181 Encounter for therapeutic drug level monitoring: Secondary | ICD-10-CM | POA: Diagnosis not present

## 2021-11-19 DIAGNOSIS — D631 Anemia in chronic kidney disease: Secondary | ICD-10-CM | POA: Diagnosis not present

## 2021-11-19 DIAGNOSIS — L299 Pruritus, unspecified: Secondary | ICD-10-CM | POA: Diagnosis not present

## 2021-11-21 DIAGNOSIS — N186 End stage renal disease: Secondary | ICD-10-CM | POA: Diagnosis not present

## 2021-11-21 DIAGNOSIS — D689 Coagulation defect, unspecified: Secondary | ICD-10-CM | POA: Diagnosis not present

## 2021-11-21 DIAGNOSIS — D631 Anemia in chronic kidney disease: Secondary | ICD-10-CM | POA: Diagnosis not present

## 2021-11-21 DIAGNOSIS — Z992 Dependence on renal dialysis: Secondary | ICD-10-CM | POA: Diagnosis not present

## 2021-11-21 DIAGNOSIS — Z5181 Encounter for therapeutic drug level monitoring: Secondary | ICD-10-CM | POA: Diagnosis not present

## 2021-11-21 DIAGNOSIS — N2581 Secondary hyperparathyroidism of renal origin: Secondary | ICD-10-CM | POA: Diagnosis not present

## 2021-11-21 DIAGNOSIS — L299 Pruritus, unspecified: Secondary | ICD-10-CM | POA: Diagnosis not present

## 2021-11-24 DIAGNOSIS — N186 End stage renal disease: Secondary | ICD-10-CM | POA: Diagnosis not present

## 2021-11-24 DIAGNOSIS — Z5181 Encounter for therapeutic drug level monitoring: Secondary | ICD-10-CM | POA: Diagnosis not present

## 2021-11-24 DIAGNOSIS — Z992 Dependence on renal dialysis: Secondary | ICD-10-CM | POA: Diagnosis not present

## 2021-11-24 DIAGNOSIS — N2581 Secondary hyperparathyroidism of renal origin: Secondary | ICD-10-CM | POA: Diagnosis not present

## 2021-11-24 DIAGNOSIS — D631 Anemia in chronic kidney disease: Secondary | ICD-10-CM | POA: Diagnosis not present

## 2021-11-24 DIAGNOSIS — D689 Coagulation defect, unspecified: Secondary | ICD-10-CM | POA: Diagnosis not present

## 2021-11-24 DIAGNOSIS — L299 Pruritus, unspecified: Secondary | ICD-10-CM | POA: Diagnosis not present

## 2021-11-26 DIAGNOSIS — D689 Coagulation defect, unspecified: Secondary | ICD-10-CM | POA: Diagnosis not present

## 2021-11-26 DIAGNOSIS — Z992 Dependence on renal dialysis: Secondary | ICD-10-CM | POA: Diagnosis not present

## 2021-11-26 DIAGNOSIS — N186 End stage renal disease: Secondary | ICD-10-CM | POA: Diagnosis not present

## 2021-11-26 DIAGNOSIS — N2581 Secondary hyperparathyroidism of renal origin: Secondary | ICD-10-CM | POA: Diagnosis not present

## 2021-11-26 DIAGNOSIS — L299 Pruritus, unspecified: Secondary | ICD-10-CM | POA: Diagnosis not present

## 2021-11-26 DIAGNOSIS — Z5181 Encounter for therapeutic drug level monitoring: Secondary | ICD-10-CM | POA: Diagnosis not present

## 2021-11-26 DIAGNOSIS — D631 Anemia in chronic kidney disease: Secondary | ICD-10-CM | POA: Diagnosis not present

## 2021-11-28 DIAGNOSIS — N186 End stage renal disease: Secondary | ICD-10-CM | POA: Diagnosis not present

## 2021-11-28 DIAGNOSIS — N2581 Secondary hyperparathyroidism of renal origin: Secondary | ICD-10-CM | POA: Diagnosis not present

## 2021-11-28 DIAGNOSIS — D689 Coagulation defect, unspecified: Secondary | ICD-10-CM | POA: Diagnosis not present

## 2021-11-28 DIAGNOSIS — Z5181 Encounter for therapeutic drug level monitoring: Secondary | ICD-10-CM | POA: Diagnosis not present

## 2021-11-28 DIAGNOSIS — L299 Pruritus, unspecified: Secondary | ICD-10-CM | POA: Diagnosis not present

## 2021-11-28 DIAGNOSIS — Z992 Dependence on renal dialysis: Secondary | ICD-10-CM | POA: Diagnosis not present

## 2021-11-28 DIAGNOSIS — D631 Anemia in chronic kidney disease: Secondary | ICD-10-CM | POA: Diagnosis not present

## 2021-12-01 DIAGNOSIS — D689 Coagulation defect, unspecified: Secondary | ICD-10-CM | POA: Diagnosis not present

## 2021-12-01 DIAGNOSIS — N2581 Secondary hyperparathyroidism of renal origin: Secondary | ICD-10-CM | POA: Diagnosis not present

## 2021-12-01 DIAGNOSIS — D631 Anemia in chronic kidney disease: Secondary | ICD-10-CM | POA: Diagnosis not present

## 2021-12-01 DIAGNOSIS — N186 End stage renal disease: Secondary | ICD-10-CM | POA: Diagnosis not present

## 2021-12-01 DIAGNOSIS — L299 Pruritus, unspecified: Secondary | ICD-10-CM | POA: Diagnosis not present

## 2021-12-01 DIAGNOSIS — Z992 Dependence on renal dialysis: Secondary | ICD-10-CM | POA: Diagnosis not present

## 2021-12-01 DIAGNOSIS — Z5181 Encounter for therapeutic drug level monitoring: Secondary | ICD-10-CM | POA: Diagnosis not present

## 2021-12-03 DIAGNOSIS — Z992 Dependence on renal dialysis: Secondary | ICD-10-CM | POA: Diagnosis not present

## 2021-12-03 DIAGNOSIS — Z5181 Encounter for therapeutic drug level monitoring: Secondary | ICD-10-CM | POA: Diagnosis not present

## 2021-12-03 DIAGNOSIS — D631 Anemia in chronic kidney disease: Secondary | ICD-10-CM | POA: Diagnosis not present

## 2021-12-03 DIAGNOSIS — N186 End stage renal disease: Secondary | ICD-10-CM | POA: Diagnosis not present

## 2021-12-03 DIAGNOSIS — D689 Coagulation defect, unspecified: Secondary | ICD-10-CM | POA: Diagnosis not present

## 2021-12-03 DIAGNOSIS — L299 Pruritus, unspecified: Secondary | ICD-10-CM | POA: Diagnosis not present

## 2021-12-03 DIAGNOSIS — N2581 Secondary hyperparathyroidism of renal origin: Secondary | ICD-10-CM | POA: Diagnosis not present

## 2021-12-05 DIAGNOSIS — L299 Pruritus, unspecified: Secondary | ICD-10-CM | POA: Diagnosis not present

## 2021-12-05 DIAGNOSIS — Z5181 Encounter for therapeutic drug level monitoring: Secondary | ICD-10-CM | POA: Diagnosis not present

## 2021-12-05 DIAGNOSIS — D631 Anemia in chronic kidney disease: Secondary | ICD-10-CM | POA: Diagnosis not present

## 2021-12-05 DIAGNOSIS — N2581 Secondary hyperparathyroidism of renal origin: Secondary | ICD-10-CM | POA: Diagnosis not present

## 2021-12-05 DIAGNOSIS — D689 Coagulation defect, unspecified: Secondary | ICD-10-CM | POA: Diagnosis not present

## 2021-12-05 DIAGNOSIS — Z992 Dependence on renal dialysis: Secondary | ICD-10-CM | POA: Diagnosis not present

## 2021-12-05 DIAGNOSIS — N186 End stage renal disease: Secondary | ICD-10-CM | POA: Diagnosis not present

## 2021-12-08 DIAGNOSIS — D689 Coagulation defect, unspecified: Secondary | ICD-10-CM | POA: Diagnosis not present

## 2021-12-08 DIAGNOSIS — D631 Anemia in chronic kidney disease: Secondary | ICD-10-CM | POA: Diagnosis not present

## 2021-12-08 DIAGNOSIS — N186 End stage renal disease: Secondary | ICD-10-CM | POA: Diagnosis not present

## 2021-12-08 DIAGNOSIS — N2581 Secondary hyperparathyroidism of renal origin: Secondary | ICD-10-CM | POA: Diagnosis not present

## 2021-12-08 DIAGNOSIS — Z992 Dependence on renal dialysis: Secondary | ICD-10-CM | POA: Diagnosis not present

## 2021-12-08 DIAGNOSIS — L299 Pruritus, unspecified: Secondary | ICD-10-CM | POA: Diagnosis not present

## 2021-12-08 DIAGNOSIS — Z5181 Encounter for therapeutic drug level monitoring: Secondary | ICD-10-CM | POA: Diagnosis not present

## 2021-12-10 DIAGNOSIS — Z992 Dependence on renal dialysis: Secondary | ICD-10-CM | POA: Diagnosis not present

## 2021-12-10 DIAGNOSIS — N186 End stage renal disease: Secondary | ICD-10-CM | POA: Diagnosis not present

## 2021-12-10 DIAGNOSIS — Z5181 Encounter for therapeutic drug level monitoring: Secondary | ICD-10-CM | POA: Diagnosis not present

## 2021-12-10 DIAGNOSIS — N2581 Secondary hyperparathyroidism of renal origin: Secondary | ICD-10-CM | POA: Diagnosis not present

## 2021-12-10 DIAGNOSIS — D631 Anemia in chronic kidney disease: Secondary | ICD-10-CM | POA: Diagnosis not present

## 2021-12-10 DIAGNOSIS — L299 Pruritus, unspecified: Secondary | ICD-10-CM | POA: Diagnosis not present

## 2021-12-10 DIAGNOSIS — D689 Coagulation defect, unspecified: Secondary | ICD-10-CM | POA: Diagnosis not present

## 2021-12-11 DIAGNOSIS — D689 Coagulation defect, unspecified: Secondary | ICD-10-CM | POA: Diagnosis not present

## 2021-12-11 DIAGNOSIS — Z5181 Encounter for therapeutic drug level monitoring: Secondary | ICD-10-CM | POA: Diagnosis not present

## 2021-12-11 DIAGNOSIS — D631 Anemia in chronic kidney disease: Secondary | ICD-10-CM | POA: Diagnosis not present

## 2021-12-11 DIAGNOSIS — N2581 Secondary hyperparathyroidism of renal origin: Secondary | ICD-10-CM | POA: Diagnosis not present

## 2021-12-11 DIAGNOSIS — N186 End stage renal disease: Secondary | ICD-10-CM | POA: Diagnosis not present

## 2021-12-11 DIAGNOSIS — Z992 Dependence on renal dialysis: Secondary | ICD-10-CM | POA: Diagnosis not present

## 2021-12-11 DIAGNOSIS — L299 Pruritus, unspecified: Secondary | ICD-10-CM | POA: Diagnosis not present

## 2021-12-12 DIAGNOSIS — L299 Pruritus, unspecified: Secondary | ICD-10-CM | POA: Diagnosis not present

## 2021-12-12 DIAGNOSIS — N186 End stage renal disease: Secondary | ICD-10-CM | POA: Diagnosis not present

## 2021-12-12 DIAGNOSIS — D689 Coagulation defect, unspecified: Secondary | ICD-10-CM | POA: Diagnosis not present

## 2021-12-12 DIAGNOSIS — D631 Anemia in chronic kidney disease: Secondary | ICD-10-CM | POA: Diagnosis not present

## 2021-12-12 DIAGNOSIS — N2581 Secondary hyperparathyroidism of renal origin: Secondary | ICD-10-CM | POA: Diagnosis not present

## 2021-12-12 DIAGNOSIS — Z5181 Encounter for therapeutic drug level monitoring: Secondary | ICD-10-CM | POA: Diagnosis not present

## 2021-12-12 DIAGNOSIS — Z992 Dependence on renal dialysis: Secondary | ICD-10-CM | POA: Diagnosis not present

## 2021-12-13 DIAGNOSIS — N186 End stage renal disease: Secondary | ICD-10-CM | POA: Diagnosis not present

## 2021-12-13 DIAGNOSIS — Z992 Dependence on renal dialysis: Secondary | ICD-10-CM | POA: Diagnosis not present

## 2021-12-13 DIAGNOSIS — T8610 Unspecified complication of kidney transplant: Secondary | ICD-10-CM | POA: Diagnosis not present

## 2021-12-15 DIAGNOSIS — D689 Coagulation defect, unspecified: Secondary | ICD-10-CM | POA: Diagnosis not present

## 2021-12-15 DIAGNOSIS — N2581 Secondary hyperparathyroidism of renal origin: Secondary | ICD-10-CM | POA: Diagnosis not present

## 2021-12-15 DIAGNOSIS — N186 End stage renal disease: Secondary | ICD-10-CM | POA: Diagnosis not present

## 2021-12-15 DIAGNOSIS — D631 Anemia in chronic kidney disease: Secondary | ICD-10-CM | POA: Diagnosis not present

## 2021-12-15 DIAGNOSIS — Z992 Dependence on renal dialysis: Secondary | ICD-10-CM | POA: Diagnosis not present

## 2021-12-15 DIAGNOSIS — L299 Pruritus, unspecified: Secondary | ICD-10-CM | POA: Diagnosis not present

## 2021-12-17 DIAGNOSIS — N2581 Secondary hyperparathyroidism of renal origin: Secondary | ICD-10-CM | POA: Diagnosis not present

## 2021-12-17 DIAGNOSIS — N186 End stage renal disease: Secondary | ICD-10-CM | POA: Diagnosis not present

## 2021-12-17 DIAGNOSIS — Z992 Dependence on renal dialysis: Secondary | ICD-10-CM | POA: Diagnosis not present

## 2021-12-17 DIAGNOSIS — D689 Coagulation defect, unspecified: Secondary | ICD-10-CM | POA: Diagnosis not present

## 2021-12-17 DIAGNOSIS — L299 Pruritus, unspecified: Secondary | ICD-10-CM | POA: Diagnosis not present

## 2021-12-17 DIAGNOSIS — D631 Anemia in chronic kidney disease: Secondary | ICD-10-CM | POA: Diagnosis not present

## 2021-12-19 DIAGNOSIS — N186 End stage renal disease: Secondary | ICD-10-CM | POA: Diagnosis not present

## 2021-12-19 DIAGNOSIS — D631 Anemia in chronic kidney disease: Secondary | ICD-10-CM | POA: Diagnosis not present

## 2021-12-19 DIAGNOSIS — D689 Coagulation defect, unspecified: Secondary | ICD-10-CM | POA: Diagnosis not present

## 2021-12-19 DIAGNOSIS — N2581 Secondary hyperparathyroidism of renal origin: Secondary | ICD-10-CM | POA: Diagnosis not present

## 2021-12-19 DIAGNOSIS — Z992 Dependence on renal dialysis: Secondary | ICD-10-CM | POA: Diagnosis not present

## 2021-12-19 DIAGNOSIS — L299 Pruritus, unspecified: Secondary | ICD-10-CM | POA: Diagnosis not present

## 2021-12-22 DIAGNOSIS — N2581 Secondary hyperparathyroidism of renal origin: Secondary | ICD-10-CM | POA: Diagnosis not present

## 2021-12-22 DIAGNOSIS — N186 End stage renal disease: Secondary | ICD-10-CM | POA: Diagnosis not present

## 2021-12-22 DIAGNOSIS — Z992 Dependence on renal dialysis: Secondary | ICD-10-CM | POA: Diagnosis not present

## 2021-12-22 DIAGNOSIS — L299 Pruritus, unspecified: Secondary | ICD-10-CM | POA: Diagnosis not present

## 2021-12-22 DIAGNOSIS — D631 Anemia in chronic kidney disease: Secondary | ICD-10-CM | POA: Diagnosis not present

## 2021-12-22 DIAGNOSIS — D689 Coagulation defect, unspecified: Secondary | ICD-10-CM | POA: Diagnosis not present

## 2021-12-23 DIAGNOSIS — Z992 Dependence on renal dialysis: Secondary | ICD-10-CM | POA: Diagnosis not present

## 2021-12-23 DIAGNOSIS — N186 End stage renal disease: Secondary | ICD-10-CM | POA: Diagnosis not present

## 2021-12-23 DIAGNOSIS — E785 Hyperlipidemia, unspecified: Secondary | ICD-10-CM | POA: Diagnosis not present

## 2021-12-23 DIAGNOSIS — B351 Tinea unguium: Secondary | ICD-10-CM | POA: Diagnosis not present

## 2021-12-24 DIAGNOSIS — D689 Coagulation defect, unspecified: Secondary | ICD-10-CM | POA: Diagnosis not present

## 2021-12-24 DIAGNOSIS — N186 End stage renal disease: Secondary | ICD-10-CM | POA: Diagnosis not present

## 2021-12-24 DIAGNOSIS — L299 Pruritus, unspecified: Secondary | ICD-10-CM | POA: Diagnosis not present

## 2021-12-24 DIAGNOSIS — N2581 Secondary hyperparathyroidism of renal origin: Secondary | ICD-10-CM | POA: Diagnosis not present

## 2021-12-24 DIAGNOSIS — D631 Anemia in chronic kidney disease: Secondary | ICD-10-CM | POA: Diagnosis not present

## 2021-12-24 DIAGNOSIS — Z992 Dependence on renal dialysis: Secondary | ICD-10-CM | POA: Diagnosis not present

## 2021-12-26 DIAGNOSIS — Z992 Dependence on renal dialysis: Secondary | ICD-10-CM | POA: Diagnosis not present

## 2021-12-26 DIAGNOSIS — D689 Coagulation defect, unspecified: Secondary | ICD-10-CM | POA: Diagnosis not present

## 2021-12-26 DIAGNOSIS — D631 Anemia in chronic kidney disease: Secondary | ICD-10-CM | POA: Diagnosis not present

## 2021-12-26 DIAGNOSIS — N186 End stage renal disease: Secondary | ICD-10-CM | POA: Diagnosis not present

## 2021-12-26 DIAGNOSIS — L299 Pruritus, unspecified: Secondary | ICD-10-CM | POA: Diagnosis not present

## 2021-12-26 DIAGNOSIS — N2581 Secondary hyperparathyroidism of renal origin: Secondary | ICD-10-CM | POA: Diagnosis not present

## 2021-12-29 DIAGNOSIS — Z992 Dependence on renal dialysis: Secondary | ICD-10-CM | POA: Diagnosis not present

## 2021-12-29 DIAGNOSIS — D631 Anemia in chronic kidney disease: Secondary | ICD-10-CM | POA: Diagnosis not present

## 2021-12-29 DIAGNOSIS — N186 End stage renal disease: Secondary | ICD-10-CM | POA: Diagnosis not present

## 2021-12-29 DIAGNOSIS — D689 Coagulation defect, unspecified: Secondary | ICD-10-CM | POA: Diagnosis not present

## 2021-12-29 DIAGNOSIS — L299 Pruritus, unspecified: Secondary | ICD-10-CM | POA: Diagnosis not present

## 2021-12-29 DIAGNOSIS — N2581 Secondary hyperparathyroidism of renal origin: Secondary | ICD-10-CM | POA: Diagnosis not present

## 2021-12-31 DIAGNOSIS — N2581 Secondary hyperparathyroidism of renal origin: Secondary | ICD-10-CM | POA: Diagnosis not present

## 2021-12-31 DIAGNOSIS — D689 Coagulation defect, unspecified: Secondary | ICD-10-CM | POA: Diagnosis not present

## 2021-12-31 DIAGNOSIS — N186 End stage renal disease: Secondary | ICD-10-CM | POA: Diagnosis not present

## 2021-12-31 DIAGNOSIS — D631 Anemia in chronic kidney disease: Secondary | ICD-10-CM | POA: Diagnosis not present

## 2021-12-31 DIAGNOSIS — L299 Pruritus, unspecified: Secondary | ICD-10-CM | POA: Diagnosis not present

## 2021-12-31 DIAGNOSIS — Z992 Dependence on renal dialysis: Secondary | ICD-10-CM | POA: Diagnosis not present

## 2022-01-02 DIAGNOSIS — Z992 Dependence on renal dialysis: Secondary | ICD-10-CM | POA: Diagnosis not present

## 2022-01-02 DIAGNOSIS — L299 Pruritus, unspecified: Secondary | ICD-10-CM | POA: Diagnosis not present

## 2022-01-02 DIAGNOSIS — N2581 Secondary hyperparathyroidism of renal origin: Secondary | ICD-10-CM | POA: Diagnosis not present

## 2022-01-02 DIAGNOSIS — D631 Anemia in chronic kidney disease: Secondary | ICD-10-CM | POA: Diagnosis not present

## 2022-01-02 DIAGNOSIS — D689 Coagulation defect, unspecified: Secondary | ICD-10-CM | POA: Diagnosis not present

## 2022-01-02 DIAGNOSIS — N186 End stage renal disease: Secondary | ICD-10-CM | POA: Diagnosis not present

## 2022-01-05 DIAGNOSIS — N186 End stage renal disease: Secondary | ICD-10-CM | POA: Diagnosis not present

## 2022-01-05 DIAGNOSIS — D689 Coagulation defect, unspecified: Secondary | ICD-10-CM | POA: Diagnosis not present

## 2022-01-05 DIAGNOSIS — D631 Anemia in chronic kidney disease: Secondary | ICD-10-CM | POA: Diagnosis not present

## 2022-01-05 DIAGNOSIS — L299 Pruritus, unspecified: Secondary | ICD-10-CM | POA: Diagnosis not present

## 2022-01-05 DIAGNOSIS — N2581 Secondary hyperparathyroidism of renal origin: Secondary | ICD-10-CM | POA: Diagnosis not present

## 2022-01-05 DIAGNOSIS — Z992 Dependence on renal dialysis: Secondary | ICD-10-CM | POA: Diagnosis not present

## 2022-01-07 DIAGNOSIS — L299 Pruritus, unspecified: Secondary | ICD-10-CM | POA: Diagnosis not present

## 2022-01-07 DIAGNOSIS — D631 Anemia in chronic kidney disease: Secondary | ICD-10-CM | POA: Diagnosis not present

## 2022-01-07 DIAGNOSIS — Z992 Dependence on renal dialysis: Secondary | ICD-10-CM | POA: Diagnosis not present

## 2022-01-07 DIAGNOSIS — N186 End stage renal disease: Secondary | ICD-10-CM | POA: Diagnosis not present

## 2022-01-07 DIAGNOSIS — D689 Coagulation defect, unspecified: Secondary | ICD-10-CM | POA: Diagnosis not present

## 2022-01-07 DIAGNOSIS — N2581 Secondary hyperparathyroidism of renal origin: Secondary | ICD-10-CM | POA: Diagnosis not present

## 2022-01-09 DIAGNOSIS — N186 End stage renal disease: Secondary | ICD-10-CM | POA: Diagnosis not present

## 2022-01-09 DIAGNOSIS — N2581 Secondary hyperparathyroidism of renal origin: Secondary | ICD-10-CM | POA: Diagnosis not present

## 2022-01-09 DIAGNOSIS — D689 Coagulation defect, unspecified: Secondary | ICD-10-CM | POA: Diagnosis not present

## 2022-01-09 DIAGNOSIS — Z992 Dependence on renal dialysis: Secondary | ICD-10-CM | POA: Diagnosis not present

## 2022-01-09 DIAGNOSIS — D631 Anemia in chronic kidney disease: Secondary | ICD-10-CM | POA: Diagnosis not present

## 2022-01-09 DIAGNOSIS — L299 Pruritus, unspecified: Secondary | ICD-10-CM | POA: Diagnosis not present

## 2022-01-12 DIAGNOSIS — L299 Pruritus, unspecified: Secondary | ICD-10-CM | POA: Diagnosis not present

## 2022-01-12 DIAGNOSIS — D631 Anemia in chronic kidney disease: Secondary | ICD-10-CM | POA: Diagnosis not present

## 2022-01-12 DIAGNOSIS — N186 End stage renal disease: Secondary | ICD-10-CM | POA: Diagnosis not present

## 2022-01-12 DIAGNOSIS — D689 Coagulation defect, unspecified: Secondary | ICD-10-CM | POA: Diagnosis not present

## 2022-01-12 DIAGNOSIS — N2581 Secondary hyperparathyroidism of renal origin: Secondary | ICD-10-CM | POA: Diagnosis not present

## 2022-01-12 DIAGNOSIS — Z992 Dependence on renal dialysis: Secondary | ICD-10-CM | POA: Diagnosis not present

## 2022-01-13 DIAGNOSIS — N186 End stage renal disease: Secondary | ICD-10-CM | POA: Diagnosis not present

## 2022-01-13 DIAGNOSIS — T8610 Unspecified complication of kidney transplant: Secondary | ICD-10-CM | POA: Diagnosis not present

## 2022-01-13 DIAGNOSIS — Z992 Dependence on renal dialysis: Secondary | ICD-10-CM | POA: Diagnosis not present

## 2022-01-14 DIAGNOSIS — Z992 Dependence on renal dialysis: Secondary | ICD-10-CM | POA: Diagnosis not present

## 2022-01-14 DIAGNOSIS — L299 Pruritus, unspecified: Secondary | ICD-10-CM | POA: Diagnosis not present

## 2022-01-14 DIAGNOSIS — Z5181 Encounter for therapeutic drug level monitoring: Secondary | ICD-10-CM | POA: Diagnosis not present

## 2022-01-14 DIAGNOSIS — D631 Anemia in chronic kidney disease: Secondary | ICD-10-CM | POA: Diagnosis not present

## 2022-01-14 DIAGNOSIS — N2581 Secondary hyperparathyroidism of renal origin: Secondary | ICD-10-CM | POA: Diagnosis not present

## 2022-01-14 DIAGNOSIS — D689 Coagulation defect, unspecified: Secondary | ICD-10-CM | POA: Diagnosis not present

## 2022-01-14 DIAGNOSIS — N186 End stage renal disease: Secondary | ICD-10-CM | POA: Diagnosis not present

## 2022-01-16 DIAGNOSIS — N2581 Secondary hyperparathyroidism of renal origin: Secondary | ICD-10-CM | POA: Diagnosis not present

## 2022-01-16 DIAGNOSIS — Z5181 Encounter for therapeutic drug level monitoring: Secondary | ICD-10-CM | POA: Diagnosis not present

## 2022-01-16 DIAGNOSIS — Z992 Dependence on renal dialysis: Secondary | ICD-10-CM | POA: Diagnosis not present

## 2022-01-16 DIAGNOSIS — L299 Pruritus, unspecified: Secondary | ICD-10-CM | POA: Diagnosis not present

## 2022-01-16 DIAGNOSIS — D689 Coagulation defect, unspecified: Secondary | ICD-10-CM | POA: Diagnosis not present

## 2022-01-16 DIAGNOSIS — N186 End stage renal disease: Secondary | ICD-10-CM | POA: Diagnosis not present

## 2022-01-16 DIAGNOSIS — D631 Anemia in chronic kidney disease: Secondary | ICD-10-CM | POA: Diagnosis not present

## 2022-01-19 DIAGNOSIS — Z5181 Encounter for therapeutic drug level monitoring: Secondary | ICD-10-CM | POA: Diagnosis not present

## 2022-01-19 DIAGNOSIS — N186 End stage renal disease: Secondary | ICD-10-CM | POA: Diagnosis not present

## 2022-01-19 DIAGNOSIS — L299 Pruritus, unspecified: Secondary | ICD-10-CM | POA: Diagnosis not present

## 2022-01-19 DIAGNOSIS — N2581 Secondary hyperparathyroidism of renal origin: Secondary | ICD-10-CM | POA: Diagnosis not present

## 2022-01-19 DIAGNOSIS — Z992 Dependence on renal dialysis: Secondary | ICD-10-CM | POA: Diagnosis not present

## 2022-01-19 DIAGNOSIS — D631 Anemia in chronic kidney disease: Secondary | ICD-10-CM | POA: Diagnosis not present

## 2022-01-19 DIAGNOSIS — D689 Coagulation defect, unspecified: Secondary | ICD-10-CM | POA: Diagnosis not present

## 2022-01-21 DIAGNOSIS — N186 End stage renal disease: Secondary | ICD-10-CM | POA: Diagnosis not present

## 2022-01-21 DIAGNOSIS — D689 Coagulation defect, unspecified: Secondary | ICD-10-CM | POA: Diagnosis not present

## 2022-01-21 DIAGNOSIS — Z5181 Encounter for therapeutic drug level monitoring: Secondary | ICD-10-CM | POA: Diagnosis not present

## 2022-01-21 DIAGNOSIS — Z992 Dependence on renal dialysis: Secondary | ICD-10-CM | POA: Diagnosis not present

## 2022-01-21 DIAGNOSIS — N2581 Secondary hyperparathyroidism of renal origin: Secondary | ICD-10-CM | POA: Diagnosis not present

## 2022-01-21 DIAGNOSIS — L299 Pruritus, unspecified: Secondary | ICD-10-CM | POA: Diagnosis not present

## 2022-01-21 DIAGNOSIS — D631 Anemia in chronic kidney disease: Secondary | ICD-10-CM | POA: Diagnosis not present

## 2022-01-23 DIAGNOSIS — Z5181 Encounter for therapeutic drug level monitoring: Secondary | ICD-10-CM | POA: Diagnosis not present

## 2022-01-23 DIAGNOSIS — D631 Anemia in chronic kidney disease: Secondary | ICD-10-CM | POA: Diagnosis not present

## 2022-01-23 DIAGNOSIS — L299 Pruritus, unspecified: Secondary | ICD-10-CM | POA: Diagnosis not present

## 2022-01-23 DIAGNOSIS — N186 End stage renal disease: Secondary | ICD-10-CM | POA: Diagnosis not present

## 2022-01-23 DIAGNOSIS — Z992 Dependence on renal dialysis: Secondary | ICD-10-CM | POA: Diagnosis not present

## 2022-01-23 DIAGNOSIS — D689 Coagulation defect, unspecified: Secondary | ICD-10-CM | POA: Diagnosis not present

## 2022-01-23 DIAGNOSIS — N2581 Secondary hyperparathyroidism of renal origin: Secondary | ICD-10-CM | POA: Diagnosis not present

## 2022-01-26 DIAGNOSIS — Z992 Dependence on renal dialysis: Secondary | ICD-10-CM | POA: Diagnosis not present

## 2022-01-26 DIAGNOSIS — D631 Anemia in chronic kidney disease: Secondary | ICD-10-CM | POA: Diagnosis not present

## 2022-01-26 DIAGNOSIS — L299 Pruritus, unspecified: Secondary | ICD-10-CM | POA: Diagnosis not present

## 2022-01-26 DIAGNOSIS — Z5181 Encounter for therapeutic drug level monitoring: Secondary | ICD-10-CM | POA: Diagnosis not present

## 2022-01-26 DIAGNOSIS — D689 Coagulation defect, unspecified: Secondary | ICD-10-CM | POA: Diagnosis not present

## 2022-01-26 DIAGNOSIS — N2581 Secondary hyperparathyroidism of renal origin: Secondary | ICD-10-CM | POA: Diagnosis not present

## 2022-01-26 DIAGNOSIS — N186 End stage renal disease: Secondary | ICD-10-CM | POA: Diagnosis not present

## 2022-01-28 DIAGNOSIS — D631 Anemia in chronic kidney disease: Secondary | ICD-10-CM | POA: Diagnosis not present

## 2022-01-28 DIAGNOSIS — L299 Pruritus, unspecified: Secondary | ICD-10-CM | POA: Diagnosis not present

## 2022-01-28 DIAGNOSIS — N2581 Secondary hyperparathyroidism of renal origin: Secondary | ICD-10-CM | POA: Diagnosis not present

## 2022-01-28 DIAGNOSIS — Z992 Dependence on renal dialysis: Secondary | ICD-10-CM | POA: Diagnosis not present

## 2022-01-28 DIAGNOSIS — D689 Coagulation defect, unspecified: Secondary | ICD-10-CM | POA: Diagnosis not present

## 2022-01-28 DIAGNOSIS — N186 End stage renal disease: Secondary | ICD-10-CM | POA: Diagnosis not present

## 2022-01-28 DIAGNOSIS — Z5181 Encounter for therapeutic drug level monitoring: Secondary | ICD-10-CM | POA: Diagnosis not present

## 2022-01-30 DIAGNOSIS — N2581 Secondary hyperparathyroidism of renal origin: Secondary | ICD-10-CM | POA: Diagnosis not present

## 2022-01-30 DIAGNOSIS — Z5181 Encounter for therapeutic drug level monitoring: Secondary | ICD-10-CM | POA: Diagnosis not present

## 2022-01-30 DIAGNOSIS — D689 Coagulation defect, unspecified: Secondary | ICD-10-CM | POA: Diagnosis not present

## 2022-01-30 DIAGNOSIS — Z992 Dependence on renal dialysis: Secondary | ICD-10-CM | POA: Diagnosis not present

## 2022-01-30 DIAGNOSIS — L299 Pruritus, unspecified: Secondary | ICD-10-CM | POA: Diagnosis not present

## 2022-01-30 DIAGNOSIS — N186 End stage renal disease: Secondary | ICD-10-CM | POA: Diagnosis not present

## 2022-01-30 DIAGNOSIS — D631 Anemia in chronic kidney disease: Secondary | ICD-10-CM | POA: Diagnosis not present

## 2022-02-02 DIAGNOSIS — N2581 Secondary hyperparathyroidism of renal origin: Secondary | ICD-10-CM | POA: Diagnosis not present

## 2022-02-02 DIAGNOSIS — D631 Anemia in chronic kidney disease: Secondary | ICD-10-CM | POA: Diagnosis not present

## 2022-02-02 DIAGNOSIS — L299 Pruritus, unspecified: Secondary | ICD-10-CM | POA: Diagnosis not present

## 2022-02-02 DIAGNOSIS — D689 Coagulation defect, unspecified: Secondary | ICD-10-CM | POA: Diagnosis not present

## 2022-02-02 DIAGNOSIS — Z992 Dependence on renal dialysis: Secondary | ICD-10-CM | POA: Diagnosis not present

## 2022-02-02 DIAGNOSIS — N186 End stage renal disease: Secondary | ICD-10-CM | POA: Diagnosis not present

## 2022-02-02 DIAGNOSIS — Z5181 Encounter for therapeutic drug level monitoring: Secondary | ICD-10-CM | POA: Diagnosis not present

## 2022-02-04 DIAGNOSIS — N186 End stage renal disease: Secondary | ICD-10-CM | POA: Diagnosis not present

## 2022-02-04 DIAGNOSIS — L299 Pruritus, unspecified: Secondary | ICD-10-CM | POA: Diagnosis not present

## 2022-02-04 DIAGNOSIS — N2581 Secondary hyperparathyroidism of renal origin: Secondary | ICD-10-CM | POA: Diagnosis not present

## 2022-02-04 DIAGNOSIS — D631 Anemia in chronic kidney disease: Secondary | ICD-10-CM | POA: Diagnosis not present

## 2022-02-04 DIAGNOSIS — Z992 Dependence on renal dialysis: Secondary | ICD-10-CM | POA: Diagnosis not present

## 2022-02-04 DIAGNOSIS — Z5181 Encounter for therapeutic drug level monitoring: Secondary | ICD-10-CM | POA: Diagnosis not present

## 2022-02-04 DIAGNOSIS — D689 Coagulation defect, unspecified: Secondary | ICD-10-CM | POA: Diagnosis not present

## 2022-02-06 DIAGNOSIS — Z992 Dependence on renal dialysis: Secondary | ICD-10-CM | POA: Diagnosis not present

## 2022-02-06 DIAGNOSIS — Z5181 Encounter for therapeutic drug level monitoring: Secondary | ICD-10-CM | POA: Diagnosis not present

## 2022-02-06 DIAGNOSIS — N2581 Secondary hyperparathyroidism of renal origin: Secondary | ICD-10-CM | POA: Diagnosis not present

## 2022-02-06 DIAGNOSIS — D631 Anemia in chronic kidney disease: Secondary | ICD-10-CM | POA: Diagnosis not present

## 2022-02-06 DIAGNOSIS — L299 Pruritus, unspecified: Secondary | ICD-10-CM | POA: Diagnosis not present

## 2022-02-06 DIAGNOSIS — D689 Coagulation defect, unspecified: Secondary | ICD-10-CM | POA: Diagnosis not present

## 2022-02-06 DIAGNOSIS — N186 End stage renal disease: Secondary | ICD-10-CM | POA: Diagnosis not present

## 2022-02-09 DIAGNOSIS — Z5181 Encounter for therapeutic drug level monitoring: Secondary | ICD-10-CM | POA: Diagnosis not present

## 2022-02-09 DIAGNOSIS — D631 Anemia in chronic kidney disease: Secondary | ICD-10-CM | POA: Diagnosis not present

## 2022-02-09 DIAGNOSIS — Z992 Dependence on renal dialysis: Secondary | ICD-10-CM | POA: Diagnosis not present

## 2022-02-09 DIAGNOSIS — N2581 Secondary hyperparathyroidism of renal origin: Secondary | ICD-10-CM | POA: Diagnosis not present

## 2022-02-09 DIAGNOSIS — N186 End stage renal disease: Secondary | ICD-10-CM | POA: Diagnosis not present

## 2022-02-09 DIAGNOSIS — D689 Coagulation defect, unspecified: Secondary | ICD-10-CM | POA: Diagnosis not present

## 2022-02-09 DIAGNOSIS — L299 Pruritus, unspecified: Secondary | ICD-10-CM | POA: Diagnosis not present

## 2022-02-10 DIAGNOSIS — N186 End stage renal disease: Secondary | ICD-10-CM | POA: Diagnosis not present

## 2022-02-10 DIAGNOSIS — Z992 Dependence on renal dialysis: Secondary | ICD-10-CM | POA: Diagnosis not present

## 2022-02-10 DIAGNOSIS — T8610 Unspecified complication of kidney transplant: Secondary | ICD-10-CM | POA: Diagnosis not present

## 2022-02-11 DIAGNOSIS — D631 Anemia in chronic kidney disease: Secondary | ICD-10-CM | POA: Diagnosis not present

## 2022-02-11 DIAGNOSIS — L299 Pruritus, unspecified: Secondary | ICD-10-CM | POA: Diagnosis not present

## 2022-02-11 DIAGNOSIS — N2581 Secondary hyperparathyroidism of renal origin: Secondary | ICD-10-CM | POA: Diagnosis not present

## 2022-02-11 DIAGNOSIS — Z992 Dependence on renal dialysis: Secondary | ICD-10-CM | POA: Diagnosis not present

## 2022-02-11 DIAGNOSIS — D689 Coagulation defect, unspecified: Secondary | ICD-10-CM | POA: Diagnosis not present

## 2022-02-11 DIAGNOSIS — Z5181 Encounter for therapeutic drug level monitoring: Secondary | ICD-10-CM | POA: Diagnosis not present

## 2022-02-11 DIAGNOSIS — N186 End stage renal disease: Secondary | ICD-10-CM | POA: Diagnosis not present

## 2022-02-11 DIAGNOSIS — R519 Headache, unspecified: Secondary | ICD-10-CM | POA: Diagnosis not present

## 2022-02-13 DIAGNOSIS — N186 End stage renal disease: Secondary | ICD-10-CM | POA: Diagnosis not present

## 2022-02-13 DIAGNOSIS — Z992 Dependence on renal dialysis: Secondary | ICD-10-CM | POA: Diagnosis not present

## 2022-02-13 DIAGNOSIS — L299 Pruritus, unspecified: Secondary | ICD-10-CM | POA: Diagnosis not present

## 2022-02-13 DIAGNOSIS — D631 Anemia in chronic kidney disease: Secondary | ICD-10-CM | POA: Diagnosis not present

## 2022-02-13 DIAGNOSIS — N2581 Secondary hyperparathyroidism of renal origin: Secondary | ICD-10-CM | POA: Diagnosis not present

## 2022-02-13 DIAGNOSIS — R519 Headache, unspecified: Secondary | ICD-10-CM | POA: Diagnosis not present

## 2022-02-13 DIAGNOSIS — D689 Coagulation defect, unspecified: Secondary | ICD-10-CM | POA: Diagnosis not present

## 2022-02-13 DIAGNOSIS — Z5181 Encounter for therapeutic drug level monitoring: Secondary | ICD-10-CM | POA: Diagnosis not present

## 2022-02-16 DIAGNOSIS — N186 End stage renal disease: Secondary | ICD-10-CM | POA: Diagnosis not present

## 2022-02-16 DIAGNOSIS — I10 Essential (primary) hypertension: Secondary | ICD-10-CM | POA: Diagnosis not present

## 2022-02-16 DIAGNOSIS — D631 Anemia in chronic kidney disease: Secondary | ICD-10-CM | POA: Diagnosis not present

## 2022-02-16 DIAGNOSIS — D689 Coagulation defect, unspecified: Secondary | ICD-10-CM | POA: Diagnosis not present

## 2022-02-16 DIAGNOSIS — R5383 Other fatigue: Secondary | ICD-10-CM | POA: Diagnosis not present

## 2022-02-16 DIAGNOSIS — N2581 Secondary hyperparathyroidism of renal origin: Secondary | ICD-10-CM | POA: Diagnosis not present

## 2022-02-16 DIAGNOSIS — L299 Pruritus, unspecified: Secondary | ICD-10-CM | POA: Diagnosis not present

## 2022-02-16 DIAGNOSIS — R519 Headache, unspecified: Secondary | ICD-10-CM | POA: Diagnosis not present

## 2022-02-16 DIAGNOSIS — Z992 Dependence on renal dialysis: Secondary | ICD-10-CM | POA: Diagnosis not present

## 2022-02-16 DIAGNOSIS — Z5181 Encounter for therapeutic drug level monitoring: Secondary | ICD-10-CM | POA: Diagnosis not present

## 2022-02-18 DIAGNOSIS — D689 Coagulation defect, unspecified: Secondary | ICD-10-CM | POA: Diagnosis not present

## 2022-02-18 DIAGNOSIS — R519 Headache, unspecified: Secondary | ICD-10-CM | POA: Diagnosis not present

## 2022-02-18 DIAGNOSIS — N2581 Secondary hyperparathyroidism of renal origin: Secondary | ICD-10-CM | POA: Diagnosis not present

## 2022-02-18 DIAGNOSIS — L299 Pruritus, unspecified: Secondary | ICD-10-CM | POA: Diagnosis not present

## 2022-02-18 DIAGNOSIS — Z992 Dependence on renal dialysis: Secondary | ICD-10-CM | POA: Diagnosis not present

## 2022-02-18 DIAGNOSIS — D631 Anemia in chronic kidney disease: Secondary | ICD-10-CM | POA: Diagnosis not present

## 2022-02-18 DIAGNOSIS — N186 End stage renal disease: Secondary | ICD-10-CM | POA: Diagnosis not present

## 2022-02-18 DIAGNOSIS — Z5181 Encounter for therapeutic drug level monitoring: Secondary | ICD-10-CM | POA: Diagnosis not present

## 2022-02-20 DIAGNOSIS — L299 Pruritus, unspecified: Secondary | ICD-10-CM | POA: Diagnosis not present

## 2022-02-20 DIAGNOSIS — Z992 Dependence on renal dialysis: Secondary | ICD-10-CM | POA: Diagnosis not present

## 2022-02-20 DIAGNOSIS — D631 Anemia in chronic kidney disease: Secondary | ICD-10-CM | POA: Diagnosis not present

## 2022-02-20 DIAGNOSIS — Z5181 Encounter for therapeutic drug level monitoring: Secondary | ICD-10-CM | POA: Diagnosis not present

## 2022-02-20 DIAGNOSIS — R519 Headache, unspecified: Secondary | ICD-10-CM | POA: Diagnosis not present

## 2022-02-20 DIAGNOSIS — N2581 Secondary hyperparathyroidism of renal origin: Secondary | ICD-10-CM | POA: Diagnosis not present

## 2022-02-20 DIAGNOSIS — N186 End stage renal disease: Secondary | ICD-10-CM | POA: Diagnosis not present

## 2022-02-20 DIAGNOSIS — D689 Coagulation defect, unspecified: Secondary | ICD-10-CM | POA: Diagnosis not present

## 2022-02-23 DIAGNOSIS — D631 Anemia in chronic kidney disease: Secondary | ICD-10-CM | POA: Diagnosis not present

## 2022-02-23 DIAGNOSIS — Z5181 Encounter for therapeutic drug level monitoring: Secondary | ICD-10-CM | POA: Diagnosis not present

## 2022-02-23 DIAGNOSIS — L299 Pruritus, unspecified: Secondary | ICD-10-CM | POA: Diagnosis not present

## 2022-02-23 DIAGNOSIS — N186 End stage renal disease: Secondary | ICD-10-CM | POA: Diagnosis not present

## 2022-02-23 DIAGNOSIS — N2581 Secondary hyperparathyroidism of renal origin: Secondary | ICD-10-CM | POA: Diagnosis not present

## 2022-02-23 DIAGNOSIS — D689 Coagulation defect, unspecified: Secondary | ICD-10-CM | POA: Diagnosis not present

## 2022-02-23 DIAGNOSIS — R519 Headache, unspecified: Secondary | ICD-10-CM | POA: Diagnosis not present

## 2022-02-23 DIAGNOSIS — Z992 Dependence on renal dialysis: Secondary | ICD-10-CM | POA: Diagnosis not present

## 2022-02-25 DIAGNOSIS — N2581 Secondary hyperparathyroidism of renal origin: Secondary | ICD-10-CM | POA: Diagnosis not present

## 2022-02-25 DIAGNOSIS — L299 Pruritus, unspecified: Secondary | ICD-10-CM | POA: Diagnosis not present

## 2022-02-25 DIAGNOSIS — R519 Headache, unspecified: Secondary | ICD-10-CM | POA: Diagnosis not present

## 2022-02-25 DIAGNOSIS — Z992 Dependence on renal dialysis: Secondary | ICD-10-CM | POA: Diagnosis not present

## 2022-02-25 DIAGNOSIS — Z5181 Encounter for therapeutic drug level monitoring: Secondary | ICD-10-CM | POA: Diagnosis not present

## 2022-02-25 DIAGNOSIS — D631 Anemia in chronic kidney disease: Secondary | ICD-10-CM | POA: Diagnosis not present

## 2022-02-25 DIAGNOSIS — N186 End stage renal disease: Secondary | ICD-10-CM | POA: Diagnosis not present

## 2022-02-25 DIAGNOSIS — D689 Coagulation defect, unspecified: Secondary | ICD-10-CM | POA: Diagnosis not present

## 2022-02-27 DIAGNOSIS — Z992 Dependence on renal dialysis: Secondary | ICD-10-CM | POA: Diagnosis not present

## 2022-02-27 DIAGNOSIS — N2581 Secondary hyperparathyroidism of renal origin: Secondary | ICD-10-CM | POA: Diagnosis not present

## 2022-02-27 DIAGNOSIS — L299 Pruritus, unspecified: Secondary | ICD-10-CM | POA: Diagnosis not present

## 2022-02-27 DIAGNOSIS — R519 Headache, unspecified: Secondary | ICD-10-CM | POA: Diagnosis not present

## 2022-02-27 DIAGNOSIS — D689 Coagulation defect, unspecified: Secondary | ICD-10-CM | POA: Diagnosis not present

## 2022-02-27 DIAGNOSIS — D631 Anemia in chronic kidney disease: Secondary | ICD-10-CM | POA: Diagnosis not present

## 2022-02-27 DIAGNOSIS — Z5181 Encounter for therapeutic drug level monitoring: Secondary | ICD-10-CM | POA: Diagnosis not present

## 2022-02-27 DIAGNOSIS — N186 End stage renal disease: Secondary | ICD-10-CM | POA: Diagnosis not present

## 2022-03-02 DIAGNOSIS — N2581 Secondary hyperparathyroidism of renal origin: Secondary | ICD-10-CM | POA: Diagnosis not present

## 2022-03-02 DIAGNOSIS — D689 Coagulation defect, unspecified: Secondary | ICD-10-CM | POA: Diagnosis not present

## 2022-03-02 DIAGNOSIS — Z992 Dependence on renal dialysis: Secondary | ICD-10-CM | POA: Diagnosis not present

## 2022-03-02 DIAGNOSIS — Z5181 Encounter for therapeutic drug level monitoring: Secondary | ICD-10-CM | POA: Diagnosis not present

## 2022-03-02 DIAGNOSIS — D631 Anemia in chronic kidney disease: Secondary | ICD-10-CM | POA: Diagnosis not present

## 2022-03-02 DIAGNOSIS — N186 End stage renal disease: Secondary | ICD-10-CM | POA: Diagnosis not present

## 2022-03-02 DIAGNOSIS — L299 Pruritus, unspecified: Secondary | ICD-10-CM | POA: Diagnosis not present

## 2022-03-02 DIAGNOSIS — R519 Headache, unspecified: Secondary | ICD-10-CM | POA: Diagnosis not present

## 2022-03-04 DIAGNOSIS — N186 End stage renal disease: Secondary | ICD-10-CM | POA: Diagnosis not present

## 2022-03-04 DIAGNOSIS — L299 Pruritus, unspecified: Secondary | ICD-10-CM | POA: Diagnosis not present

## 2022-03-04 DIAGNOSIS — Z5181 Encounter for therapeutic drug level monitoring: Secondary | ICD-10-CM | POA: Diagnosis not present

## 2022-03-04 DIAGNOSIS — R519 Headache, unspecified: Secondary | ICD-10-CM | POA: Diagnosis not present

## 2022-03-04 DIAGNOSIS — D631 Anemia in chronic kidney disease: Secondary | ICD-10-CM | POA: Diagnosis not present

## 2022-03-04 DIAGNOSIS — Z992 Dependence on renal dialysis: Secondary | ICD-10-CM | POA: Diagnosis not present

## 2022-03-04 DIAGNOSIS — D689 Coagulation defect, unspecified: Secondary | ICD-10-CM | POA: Diagnosis not present

## 2022-03-04 DIAGNOSIS — N2581 Secondary hyperparathyroidism of renal origin: Secondary | ICD-10-CM | POA: Diagnosis not present

## 2022-03-06 DIAGNOSIS — R519 Headache, unspecified: Secondary | ICD-10-CM | POA: Diagnosis not present

## 2022-03-06 DIAGNOSIS — N186 End stage renal disease: Secondary | ICD-10-CM | POA: Diagnosis not present

## 2022-03-06 DIAGNOSIS — L299 Pruritus, unspecified: Secondary | ICD-10-CM | POA: Diagnosis not present

## 2022-03-06 DIAGNOSIS — Z992 Dependence on renal dialysis: Secondary | ICD-10-CM | POA: Diagnosis not present

## 2022-03-06 DIAGNOSIS — Z5181 Encounter for therapeutic drug level monitoring: Secondary | ICD-10-CM | POA: Diagnosis not present

## 2022-03-06 DIAGNOSIS — D689 Coagulation defect, unspecified: Secondary | ICD-10-CM | POA: Diagnosis not present

## 2022-03-06 DIAGNOSIS — D631 Anemia in chronic kidney disease: Secondary | ICD-10-CM | POA: Diagnosis not present

## 2022-03-06 DIAGNOSIS — N2581 Secondary hyperparathyroidism of renal origin: Secondary | ICD-10-CM | POA: Diagnosis not present

## 2022-03-09 DIAGNOSIS — R519 Headache, unspecified: Secondary | ICD-10-CM | POA: Diagnosis not present

## 2022-03-09 DIAGNOSIS — L299 Pruritus, unspecified: Secondary | ICD-10-CM | POA: Diagnosis not present

## 2022-03-09 DIAGNOSIS — D631 Anemia in chronic kidney disease: Secondary | ICD-10-CM | POA: Diagnosis not present

## 2022-03-09 DIAGNOSIS — Z992 Dependence on renal dialysis: Secondary | ICD-10-CM | POA: Diagnosis not present

## 2022-03-09 DIAGNOSIS — Z5181 Encounter for therapeutic drug level monitoring: Secondary | ICD-10-CM | POA: Diagnosis not present

## 2022-03-09 DIAGNOSIS — N2581 Secondary hyperparathyroidism of renal origin: Secondary | ICD-10-CM | POA: Diagnosis not present

## 2022-03-09 DIAGNOSIS — D689 Coagulation defect, unspecified: Secondary | ICD-10-CM | POA: Diagnosis not present

## 2022-03-09 DIAGNOSIS — N186 End stage renal disease: Secondary | ICD-10-CM | POA: Diagnosis not present

## 2022-03-11 DIAGNOSIS — N186 End stage renal disease: Secondary | ICD-10-CM | POA: Diagnosis not present

## 2022-03-11 DIAGNOSIS — D631 Anemia in chronic kidney disease: Secondary | ICD-10-CM | POA: Diagnosis not present

## 2022-03-11 DIAGNOSIS — R519 Headache, unspecified: Secondary | ICD-10-CM | POA: Diagnosis not present

## 2022-03-11 DIAGNOSIS — L299 Pruritus, unspecified: Secondary | ICD-10-CM | POA: Diagnosis not present

## 2022-03-11 DIAGNOSIS — Z5181 Encounter for therapeutic drug level monitoring: Secondary | ICD-10-CM | POA: Diagnosis not present

## 2022-03-11 DIAGNOSIS — Z992 Dependence on renal dialysis: Secondary | ICD-10-CM | POA: Diagnosis not present

## 2022-03-11 DIAGNOSIS — N2581 Secondary hyperparathyroidism of renal origin: Secondary | ICD-10-CM | POA: Diagnosis not present

## 2022-03-11 DIAGNOSIS — D689 Coagulation defect, unspecified: Secondary | ICD-10-CM | POA: Diagnosis not present

## 2022-03-13 DIAGNOSIS — T8610 Unspecified complication of kidney transplant: Secondary | ICD-10-CM | POA: Diagnosis not present

## 2022-03-13 DIAGNOSIS — N2581 Secondary hyperparathyroidism of renal origin: Secondary | ICD-10-CM | POA: Diagnosis not present

## 2022-03-13 DIAGNOSIS — D689 Coagulation defect, unspecified: Secondary | ICD-10-CM | POA: Diagnosis not present

## 2022-03-13 DIAGNOSIS — N186 End stage renal disease: Secondary | ICD-10-CM | POA: Diagnosis not present

## 2022-03-13 DIAGNOSIS — Z992 Dependence on renal dialysis: Secondary | ICD-10-CM | POA: Diagnosis not present

## 2022-03-16 DIAGNOSIS — E875 Hyperkalemia: Secondary | ICD-10-CM | POA: Diagnosis not present

## 2022-03-16 DIAGNOSIS — N2581 Secondary hyperparathyroidism of renal origin: Secondary | ICD-10-CM | POA: Diagnosis not present

## 2022-03-16 DIAGNOSIS — D689 Coagulation defect, unspecified: Secondary | ICD-10-CM | POA: Diagnosis not present

## 2022-03-16 DIAGNOSIS — Z992 Dependence on renal dialysis: Secondary | ICD-10-CM | POA: Diagnosis not present

## 2022-03-16 DIAGNOSIS — D631 Anemia in chronic kidney disease: Secondary | ICD-10-CM | POA: Diagnosis not present

## 2022-03-16 DIAGNOSIS — Z5181 Encounter for therapeutic drug level monitoring: Secondary | ICD-10-CM | POA: Diagnosis not present

## 2022-03-16 DIAGNOSIS — L299 Pruritus, unspecified: Secondary | ICD-10-CM | POA: Diagnosis not present

## 2022-03-16 DIAGNOSIS — N186 End stage renal disease: Secondary | ICD-10-CM | POA: Diagnosis not present

## 2022-03-17 DIAGNOSIS — R931 Abnormal findings on diagnostic imaging of heart and coronary circulation: Secondary | ICD-10-CM | POA: Diagnosis not present

## 2022-03-17 DIAGNOSIS — R9431 Abnormal electrocardiogram [ECG] [EKG]: Secondary | ICD-10-CM | POA: Diagnosis not present

## 2022-03-17 DIAGNOSIS — Z01810 Encounter for preprocedural cardiovascular examination: Secondary | ICD-10-CM | POA: Diagnosis not present

## 2022-03-18 DIAGNOSIS — L299 Pruritus, unspecified: Secondary | ICD-10-CM | POA: Diagnosis not present

## 2022-03-18 DIAGNOSIS — D689 Coagulation defect, unspecified: Secondary | ICD-10-CM | POA: Diagnosis not present

## 2022-03-18 DIAGNOSIS — N186 End stage renal disease: Secondary | ICD-10-CM | POA: Diagnosis not present

## 2022-03-18 DIAGNOSIS — D631 Anemia in chronic kidney disease: Secondary | ICD-10-CM | POA: Diagnosis not present

## 2022-03-18 DIAGNOSIS — N2581 Secondary hyperparathyroidism of renal origin: Secondary | ICD-10-CM | POA: Diagnosis not present

## 2022-03-18 DIAGNOSIS — E875 Hyperkalemia: Secondary | ICD-10-CM | POA: Diagnosis not present

## 2022-03-18 DIAGNOSIS — Z5181 Encounter for therapeutic drug level monitoring: Secondary | ICD-10-CM | POA: Diagnosis not present

## 2022-03-18 DIAGNOSIS — Z992 Dependence on renal dialysis: Secondary | ICD-10-CM | POA: Diagnosis not present

## 2022-03-20 DIAGNOSIS — I2510 Atherosclerotic heart disease of native coronary artery without angina pectoris: Secondary | ICD-10-CM | POA: Diagnosis not present

## 2022-03-20 DIAGNOSIS — Z01818 Encounter for other preprocedural examination: Secondary | ICD-10-CM | POA: Diagnosis not present

## 2022-03-20 DIAGNOSIS — R9439 Abnormal result of other cardiovascular function study: Secondary | ICD-10-CM | POA: Diagnosis not present

## 2022-03-23 DIAGNOSIS — D689 Coagulation defect, unspecified: Secondary | ICD-10-CM | POA: Diagnosis not present

## 2022-03-23 DIAGNOSIS — L299 Pruritus, unspecified: Secondary | ICD-10-CM | POA: Diagnosis not present

## 2022-03-23 DIAGNOSIS — N2581 Secondary hyperparathyroidism of renal origin: Secondary | ICD-10-CM | POA: Diagnosis not present

## 2022-03-23 DIAGNOSIS — E875 Hyperkalemia: Secondary | ICD-10-CM | POA: Diagnosis not present

## 2022-03-23 DIAGNOSIS — Z5181 Encounter for therapeutic drug level monitoring: Secondary | ICD-10-CM | POA: Diagnosis not present

## 2022-03-23 DIAGNOSIS — N186 End stage renal disease: Secondary | ICD-10-CM | POA: Diagnosis not present

## 2022-03-23 DIAGNOSIS — Z992 Dependence on renal dialysis: Secondary | ICD-10-CM | POA: Diagnosis not present

## 2022-03-23 DIAGNOSIS — D631 Anemia in chronic kidney disease: Secondary | ICD-10-CM | POA: Diagnosis not present

## 2022-03-25 DIAGNOSIS — E875 Hyperkalemia: Secondary | ICD-10-CM | POA: Diagnosis not present

## 2022-03-25 DIAGNOSIS — L299 Pruritus, unspecified: Secondary | ICD-10-CM | POA: Diagnosis not present

## 2022-03-25 DIAGNOSIS — D689 Coagulation defect, unspecified: Secondary | ICD-10-CM | POA: Diagnosis not present

## 2022-03-25 DIAGNOSIS — N2581 Secondary hyperparathyroidism of renal origin: Secondary | ICD-10-CM | POA: Diagnosis not present

## 2022-03-25 DIAGNOSIS — N186 End stage renal disease: Secondary | ICD-10-CM | POA: Diagnosis not present

## 2022-03-25 DIAGNOSIS — Z992 Dependence on renal dialysis: Secondary | ICD-10-CM | POA: Diagnosis not present

## 2022-03-25 DIAGNOSIS — Z5181 Encounter for therapeutic drug level monitoring: Secondary | ICD-10-CM | POA: Diagnosis not present

## 2022-03-25 DIAGNOSIS — D631 Anemia in chronic kidney disease: Secondary | ICD-10-CM | POA: Diagnosis not present

## 2022-03-27 DIAGNOSIS — L299 Pruritus, unspecified: Secondary | ICD-10-CM | POA: Diagnosis not present

## 2022-03-27 DIAGNOSIS — Z5181 Encounter for therapeutic drug level monitoring: Secondary | ICD-10-CM | POA: Diagnosis not present

## 2022-03-27 DIAGNOSIS — D689 Coagulation defect, unspecified: Secondary | ICD-10-CM | POA: Diagnosis not present

## 2022-03-27 DIAGNOSIS — N2581 Secondary hyperparathyroidism of renal origin: Secondary | ICD-10-CM | POA: Diagnosis not present

## 2022-03-27 DIAGNOSIS — D631 Anemia in chronic kidney disease: Secondary | ICD-10-CM | POA: Diagnosis not present

## 2022-03-27 DIAGNOSIS — N186 End stage renal disease: Secondary | ICD-10-CM | POA: Diagnosis not present

## 2022-03-27 DIAGNOSIS — Z992 Dependence on renal dialysis: Secondary | ICD-10-CM | POA: Diagnosis not present

## 2022-03-27 DIAGNOSIS — E875 Hyperkalemia: Secondary | ICD-10-CM | POA: Diagnosis not present

## 2022-03-30 DIAGNOSIS — D631 Anemia in chronic kidney disease: Secondary | ICD-10-CM | POA: Diagnosis not present

## 2022-03-30 DIAGNOSIS — N2581 Secondary hyperparathyroidism of renal origin: Secondary | ICD-10-CM | POA: Diagnosis not present

## 2022-03-30 DIAGNOSIS — D689 Coagulation defect, unspecified: Secondary | ICD-10-CM | POA: Diagnosis not present

## 2022-03-30 DIAGNOSIS — L299 Pruritus, unspecified: Secondary | ICD-10-CM | POA: Diagnosis not present

## 2022-03-30 DIAGNOSIS — E875 Hyperkalemia: Secondary | ICD-10-CM | POA: Diagnosis not present

## 2022-03-30 DIAGNOSIS — Z992 Dependence on renal dialysis: Secondary | ICD-10-CM | POA: Diagnosis not present

## 2022-03-30 DIAGNOSIS — Z5181 Encounter for therapeutic drug level monitoring: Secondary | ICD-10-CM | POA: Diagnosis not present

## 2022-03-30 DIAGNOSIS — N186 End stage renal disease: Secondary | ICD-10-CM | POA: Diagnosis not present

## 2022-04-01 DIAGNOSIS — D689 Coagulation defect, unspecified: Secondary | ICD-10-CM | POA: Diagnosis not present

## 2022-04-01 DIAGNOSIS — E875 Hyperkalemia: Secondary | ICD-10-CM | POA: Diagnosis not present

## 2022-04-01 DIAGNOSIS — D631 Anemia in chronic kidney disease: Secondary | ICD-10-CM | POA: Diagnosis not present

## 2022-04-01 DIAGNOSIS — Z992 Dependence on renal dialysis: Secondary | ICD-10-CM | POA: Diagnosis not present

## 2022-04-01 DIAGNOSIS — N2581 Secondary hyperparathyroidism of renal origin: Secondary | ICD-10-CM | POA: Diagnosis not present

## 2022-04-01 DIAGNOSIS — L299 Pruritus, unspecified: Secondary | ICD-10-CM | POA: Diagnosis not present

## 2022-04-01 DIAGNOSIS — Z5181 Encounter for therapeutic drug level monitoring: Secondary | ICD-10-CM | POA: Diagnosis not present

## 2022-04-01 DIAGNOSIS — N186 End stage renal disease: Secondary | ICD-10-CM | POA: Diagnosis not present

## 2022-04-03 DIAGNOSIS — D689 Coagulation defect, unspecified: Secondary | ICD-10-CM | POA: Diagnosis not present

## 2022-04-03 DIAGNOSIS — L299 Pruritus, unspecified: Secondary | ICD-10-CM | POA: Diagnosis not present

## 2022-04-03 DIAGNOSIS — E875 Hyperkalemia: Secondary | ICD-10-CM | POA: Diagnosis not present

## 2022-04-03 DIAGNOSIS — Z5181 Encounter for therapeutic drug level monitoring: Secondary | ICD-10-CM | POA: Diagnosis not present

## 2022-04-03 DIAGNOSIS — Z992 Dependence on renal dialysis: Secondary | ICD-10-CM | POA: Diagnosis not present

## 2022-04-03 DIAGNOSIS — N2581 Secondary hyperparathyroidism of renal origin: Secondary | ICD-10-CM | POA: Diagnosis not present

## 2022-04-03 DIAGNOSIS — D631 Anemia in chronic kidney disease: Secondary | ICD-10-CM | POA: Diagnosis not present

## 2022-04-03 DIAGNOSIS — N186 End stage renal disease: Secondary | ICD-10-CM | POA: Diagnosis not present

## 2022-04-06 DIAGNOSIS — D631 Anemia in chronic kidney disease: Secondary | ICD-10-CM | POA: Diagnosis not present

## 2022-04-06 DIAGNOSIS — D689 Coagulation defect, unspecified: Secondary | ICD-10-CM | POA: Diagnosis not present

## 2022-04-06 DIAGNOSIS — E875 Hyperkalemia: Secondary | ICD-10-CM | POA: Diagnosis not present

## 2022-04-06 DIAGNOSIS — Z5181 Encounter for therapeutic drug level monitoring: Secondary | ICD-10-CM | POA: Diagnosis not present

## 2022-04-06 DIAGNOSIS — N2581 Secondary hyperparathyroidism of renal origin: Secondary | ICD-10-CM | POA: Diagnosis not present

## 2022-04-06 DIAGNOSIS — Z992 Dependence on renal dialysis: Secondary | ICD-10-CM | POA: Diagnosis not present

## 2022-04-06 DIAGNOSIS — N186 End stage renal disease: Secondary | ICD-10-CM | POA: Diagnosis not present

## 2022-04-06 DIAGNOSIS — L299 Pruritus, unspecified: Secondary | ICD-10-CM | POA: Diagnosis not present

## 2022-04-08 DIAGNOSIS — N2581 Secondary hyperparathyroidism of renal origin: Secondary | ICD-10-CM | POA: Diagnosis not present

## 2022-04-08 DIAGNOSIS — N186 End stage renal disease: Secondary | ICD-10-CM | POA: Diagnosis not present

## 2022-04-08 DIAGNOSIS — D631 Anemia in chronic kidney disease: Secondary | ICD-10-CM | POA: Diagnosis not present

## 2022-04-08 DIAGNOSIS — Z5181 Encounter for therapeutic drug level monitoring: Secondary | ICD-10-CM | POA: Diagnosis not present

## 2022-04-08 DIAGNOSIS — L299 Pruritus, unspecified: Secondary | ICD-10-CM | POA: Diagnosis not present

## 2022-04-08 DIAGNOSIS — E875 Hyperkalemia: Secondary | ICD-10-CM | POA: Diagnosis not present

## 2022-04-08 DIAGNOSIS — Z992 Dependence on renal dialysis: Secondary | ICD-10-CM | POA: Diagnosis not present

## 2022-04-08 DIAGNOSIS — D689 Coagulation defect, unspecified: Secondary | ICD-10-CM | POA: Diagnosis not present

## 2022-04-10 DIAGNOSIS — L299 Pruritus, unspecified: Secondary | ICD-10-CM | POA: Diagnosis not present

## 2022-04-10 DIAGNOSIS — Z992 Dependence on renal dialysis: Secondary | ICD-10-CM | POA: Diagnosis not present

## 2022-04-10 DIAGNOSIS — Z5181 Encounter for therapeutic drug level monitoring: Secondary | ICD-10-CM | POA: Diagnosis not present

## 2022-04-10 DIAGNOSIS — N186 End stage renal disease: Secondary | ICD-10-CM | POA: Diagnosis not present

## 2022-04-10 DIAGNOSIS — D631 Anemia in chronic kidney disease: Secondary | ICD-10-CM | POA: Diagnosis not present

## 2022-04-10 DIAGNOSIS — E875 Hyperkalemia: Secondary | ICD-10-CM | POA: Diagnosis not present

## 2022-04-10 DIAGNOSIS — N2581 Secondary hyperparathyroidism of renal origin: Secondary | ICD-10-CM | POA: Diagnosis not present

## 2022-04-10 DIAGNOSIS — D689 Coagulation defect, unspecified: Secondary | ICD-10-CM | POA: Diagnosis not present

## 2022-04-12 DIAGNOSIS — T8610 Unspecified complication of kidney transplant: Secondary | ICD-10-CM | POA: Diagnosis not present

## 2022-04-12 DIAGNOSIS — N186 End stage renal disease: Secondary | ICD-10-CM | POA: Diagnosis not present

## 2022-04-12 DIAGNOSIS — Z992 Dependence on renal dialysis: Secondary | ICD-10-CM | POA: Diagnosis not present

## 2022-04-13 DIAGNOSIS — D631 Anemia in chronic kidney disease: Secondary | ICD-10-CM | POA: Diagnosis not present

## 2022-04-13 DIAGNOSIS — Z5181 Encounter for therapeutic drug level monitoring: Secondary | ICD-10-CM | POA: Diagnosis not present

## 2022-04-13 DIAGNOSIS — N186 End stage renal disease: Secondary | ICD-10-CM | POA: Diagnosis not present

## 2022-04-13 DIAGNOSIS — N2581 Secondary hyperparathyroidism of renal origin: Secondary | ICD-10-CM | POA: Diagnosis not present

## 2022-04-13 DIAGNOSIS — D689 Coagulation defect, unspecified: Secondary | ICD-10-CM | POA: Diagnosis not present

## 2022-04-13 DIAGNOSIS — Z992 Dependence on renal dialysis: Secondary | ICD-10-CM | POA: Diagnosis not present

## 2022-04-13 DIAGNOSIS — R519 Headache, unspecified: Secondary | ICD-10-CM | POA: Diagnosis not present

## 2022-04-13 DIAGNOSIS — L299 Pruritus, unspecified: Secondary | ICD-10-CM | POA: Diagnosis not present

## 2022-04-15 DIAGNOSIS — N2581 Secondary hyperparathyroidism of renal origin: Secondary | ICD-10-CM | POA: Diagnosis not present

## 2022-04-15 DIAGNOSIS — L299 Pruritus, unspecified: Secondary | ICD-10-CM | POA: Diagnosis not present

## 2022-04-15 DIAGNOSIS — D631 Anemia in chronic kidney disease: Secondary | ICD-10-CM | POA: Diagnosis not present

## 2022-04-15 DIAGNOSIS — N186 End stage renal disease: Secondary | ICD-10-CM | POA: Diagnosis not present

## 2022-04-15 DIAGNOSIS — D689 Coagulation defect, unspecified: Secondary | ICD-10-CM | POA: Diagnosis not present

## 2022-04-15 DIAGNOSIS — Z5181 Encounter for therapeutic drug level monitoring: Secondary | ICD-10-CM | POA: Diagnosis not present

## 2022-04-15 DIAGNOSIS — Z992 Dependence on renal dialysis: Secondary | ICD-10-CM | POA: Diagnosis not present

## 2022-04-15 DIAGNOSIS — R519 Headache, unspecified: Secondary | ICD-10-CM | POA: Diagnosis not present

## 2022-04-17 DIAGNOSIS — Z5181 Encounter for therapeutic drug level monitoring: Secondary | ICD-10-CM | POA: Diagnosis not present

## 2022-04-17 DIAGNOSIS — D631 Anemia in chronic kidney disease: Secondary | ICD-10-CM | POA: Diagnosis not present

## 2022-04-17 DIAGNOSIS — L299 Pruritus, unspecified: Secondary | ICD-10-CM | POA: Diagnosis not present

## 2022-04-17 DIAGNOSIS — R519 Headache, unspecified: Secondary | ICD-10-CM | POA: Diagnosis not present

## 2022-04-17 DIAGNOSIS — Z992 Dependence on renal dialysis: Secondary | ICD-10-CM | POA: Diagnosis not present

## 2022-04-17 DIAGNOSIS — D689 Coagulation defect, unspecified: Secondary | ICD-10-CM | POA: Diagnosis not present

## 2022-04-17 DIAGNOSIS — N186 End stage renal disease: Secondary | ICD-10-CM | POA: Diagnosis not present

## 2022-04-17 DIAGNOSIS — N2581 Secondary hyperparathyroidism of renal origin: Secondary | ICD-10-CM | POA: Diagnosis not present

## 2022-04-20 DIAGNOSIS — N2581 Secondary hyperparathyroidism of renal origin: Secondary | ICD-10-CM | POA: Diagnosis not present

## 2022-04-20 DIAGNOSIS — L299 Pruritus, unspecified: Secondary | ICD-10-CM | POA: Diagnosis not present

## 2022-04-20 DIAGNOSIS — Z992 Dependence on renal dialysis: Secondary | ICD-10-CM | POA: Diagnosis not present

## 2022-04-20 DIAGNOSIS — Z5181 Encounter for therapeutic drug level monitoring: Secondary | ICD-10-CM | POA: Diagnosis not present

## 2022-04-20 DIAGNOSIS — N186 End stage renal disease: Secondary | ICD-10-CM | POA: Diagnosis not present

## 2022-04-20 DIAGNOSIS — R519 Headache, unspecified: Secondary | ICD-10-CM | POA: Diagnosis not present

## 2022-04-20 DIAGNOSIS — D689 Coagulation defect, unspecified: Secondary | ICD-10-CM | POA: Diagnosis not present

## 2022-04-20 DIAGNOSIS — D631 Anemia in chronic kidney disease: Secondary | ICD-10-CM | POA: Diagnosis not present

## 2022-04-22 DIAGNOSIS — N186 End stage renal disease: Secondary | ICD-10-CM | POA: Diagnosis not present

## 2022-04-22 DIAGNOSIS — L299 Pruritus, unspecified: Secondary | ICD-10-CM | POA: Diagnosis not present

## 2022-04-22 DIAGNOSIS — Z992 Dependence on renal dialysis: Secondary | ICD-10-CM | POA: Diagnosis not present

## 2022-04-22 DIAGNOSIS — Z5181 Encounter for therapeutic drug level monitoring: Secondary | ICD-10-CM | POA: Diagnosis not present

## 2022-04-22 DIAGNOSIS — D631 Anemia in chronic kidney disease: Secondary | ICD-10-CM | POA: Diagnosis not present

## 2022-04-22 DIAGNOSIS — N2581 Secondary hyperparathyroidism of renal origin: Secondary | ICD-10-CM | POA: Diagnosis not present

## 2022-04-22 DIAGNOSIS — R519 Headache, unspecified: Secondary | ICD-10-CM | POA: Diagnosis not present

## 2022-04-22 DIAGNOSIS — D689 Coagulation defect, unspecified: Secondary | ICD-10-CM | POA: Diagnosis not present

## 2022-04-24 DIAGNOSIS — D689 Coagulation defect, unspecified: Secondary | ICD-10-CM | POA: Diagnosis not present

## 2022-04-24 DIAGNOSIS — Z5181 Encounter for therapeutic drug level monitoring: Secondary | ICD-10-CM | POA: Diagnosis not present

## 2022-04-24 DIAGNOSIS — R519 Headache, unspecified: Secondary | ICD-10-CM | POA: Diagnosis not present

## 2022-04-24 DIAGNOSIS — N2581 Secondary hyperparathyroidism of renal origin: Secondary | ICD-10-CM | POA: Diagnosis not present

## 2022-04-24 DIAGNOSIS — N186 End stage renal disease: Secondary | ICD-10-CM | POA: Diagnosis not present

## 2022-04-24 DIAGNOSIS — Z992 Dependence on renal dialysis: Secondary | ICD-10-CM | POA: Diagnosis not present

## 2022-04-24 DIAGNOSIS — L299 Pruritus, unspecified: Secondary | ICD-10-CM | POA: Diagnosis not present

## 2022-04-24 DIAGNOSIS — D631 Anemia in chronic kidney disease: Secondary | ICD-10-CM | POA: Diagnosis not present

## 2022-04-27 DIAGNOSIS — N186 End stage renal disease: Secondary | ICD-10-CM | POA: Diagnosis not present

## 2022-04-27 DIAGNOSIS — Z992 Dependence on renal dialysis: Secondary | ICD-10-CM | POA: Diagnosis not present

## 2022-04-27 DIAGNOSIS — Z5181 Encounter for therapeutic drug level monitoring: Secondary | ICD-10-CM | POA: Diagnosis not present

## 2022-04-27 DIAGNOSIS — R519 Headache, unspecified: Secondary | ICD-10-CM | POA: Diagnosis not present

## 2022-04-27 DIAGNOSIS — D631 Anemia in chronic kidney disease: Secondary | ICD-10-CM | POA: Diagnosis not present

## 2022-04-27 DIAGNOSIS — D689 Coagulation defect, unspecified: Secondary | ICD-10-CM | POA: Diagnosis not present

## 2022-04-27 DIAGNOSIS — N2581 Secondary hyperparathyroidism of renal origin: Secondary | ICD-10-CM | POA: Diagnosis not present

## 2022-04-27 DIAGNOSIS — L299 Pruritus, unspecified: Secondary | ICD-10-CM | POA: Diagnosis not present

## 2022-04-28 DIAGNOSIS — Z992 Dependence on renal dialysis: Secondary | ICD-10-CM | POA: Diagnosis not present

## 2022-04-28 DIAGNOSIS — I120 Hypertensive chronic kidney disease with stage 5 chronic kidney disease or end stage renal disease: Secondary | ICD-10-CM | POA: Diagnosis not present

## 2022-04-29 DIAGNOSIS — R519 Headache, unspecified: Secondary | ICD-10-CM | POA: Diagnosis not present

## 2022-04-29 DIAGNOSIS — Z5181 Encounter for therapeutic drug level monitoring: Secondary | ICD-10-CM | POA: Diagnosis not present

## 2022-04-29 DIAGNOSIS — L299 Pruritus, unspecified: Secondary | ICD-10-CM | POA: Diagnosis not present

## 2022-04-29 DIAGNOSIS — D689 Coagulation defect, unspecified: Secondary | ICD-10-CM | POA: Diagnosis not present

## 2022-04-29 DIAGNOSIS — D631 Anemia in chronic kidney disease: Secondary | ICD-10-CM | POA: Diagnosis not present

## 2022-04-29 DIAGNOSIS — N2581 Secondary hyperparathyroidism of renal origin: Secondary | ICD-10-CM | POA: Diagnosis not present

## 2022-04-29 DIAGNOSIS — N186 End stage renal disease: Secondary | ICD-10-CM | POA: Diagnosis not present

## 2022-04-29 DIAGNOSIS — Z992 Dependence on renal dialysis: Secondary | ICD-10-CM | POA: Diagnosis not present

## 2022-05-01 DIAGNOSIS — N2581 Secondary hyperparathyroidism of renal origin: Secondary | ICD-10-CM | POA: Diagnosis not present

## 2022-05-01 DIAGNOSIS — R519 Headache, unspecified: Secondary | ICD-10-CM | POA: Diagnosis not present

## 2022-05-01 DIAGNOSIS — Z992 Dependence on renal dialysis: Secondary | ICD-10-CM | POA: Diagnosis not present

## 2022-05-01 DIAGNOSIS — N186 End stage renal disease: Secondary | ICD-10-CM | POA: Diagnosis not present

## 2022-05-01 DIAGNOSIS — L299 Pruritus, unspecified: Secondary | ICD-10-CM | POA: Diagnosis not present

## 2022-05-01 DIAGNOSIS — Z5181 Encounter for therapeutic drug level monitoring: Secondary | ICD-10-CM | POA: Diagnosis not present

## 2022-05-01 DIAGNOSIS — D689 Coagulation defect, unspecified: Secondary | ICD-10-CM | POA: Diagnosis not present

## 2022-05-01 DIAGNOSIS — D631 Anemia in chronic kidney disease: Secondary | ICD-10-CM | POA: Diagnosis not present

## 2022-05-04 DIAGNOSIS — D689 Coagulation defect, unspecified: Secondary | ICD-10-CM | POA: Diagnosis not present

## 2022-05-04 DIAGNOSIS — Z5181 Encounter for therapeutic drug level monitoring: Secondary | ICD-10-CM | POA: Diagnosis not present

## 2022-05-04 DIAGNOSIS — N2581 Secondary hyperparathyroidism of renal origin: Secondary | ICD-10-CM | POA: Diagnosis not present

## 2022-05-04 DIAGNOSIS — N186 End stage renal disease: Secondary | ICD-10-CM | POA: Diagnosis not present

## 2022-05-04 DIAGNOSIS — Z992 Dependence on renal dialysis: Secondary | ICD-10-CM | POA: Diagnosis not present

## 2022-05-04 DIAGNOSIS — R519 Headache, unspecified: Secondary | ICD-10-CM | POA: Diagnosis not present

## 2022-05-04 DIAGNOSIS — L299 Pruritus, unspecified: Secondary | ICD-10-CM | POA: Diagnosis not present

## 2022-05-04 DIAGNOSIS — D631 Anemia in chronic kidney disease: Secondary | ICD-10-CM | POA: Diagnosis not present

## 2022-05-06 DIAGNOSIS — N2581 Secondary hyperparathyroidism of renal origin: Secondary | ICD-10-CM | POA: Diagnosis not present

## 2022-05-06 DIAGNOSIS — D631 Anemia in chronic kidney disease: Secondary | ICD-10-CM | POA: Diagnosis not present

## 2022-05-06 DIAGNOSIS — R519 Headache, unspecified: Secondary | ICD-10-CM | POA: Diagnosis not present

## 2022-05-06 DIAGNOSIS — Z5181 Encounter for therapeutic drug level monitoring: Secondary | ICD-10-CM | POA: Diagnosis not present

## 2022-05-06 DIAGNOSIS — Z992 Dependence on renal dialysis: Secondary | ICD-10-CM | POA: Diagnosis not present

## 2022-05-06 DIAGNOSIS — D689 Coagulation defect, unspecified: Secondary | ICD-10-CM | POA: Diagnosis not present

## 2022-05-06 DIAGNOSIS — L299 Pruritus, unspecified: Secondary | ICD-10-CM | POA: Diagnosis not present

## 2022-05-06 DIAGNOSIS — N186 End stage renal disease: Secondary | ICD-10-CM | POA: Diagnosis not present

## 2022-05-08 DIAGNOSIS — R519 Headache, unspecified: Secondary | ICD-10-CM | POA: Diagnosis not present

## 2022-05-08 DIAGNOSIS — N186 End stage renal disease: Secondary | ICD-10-CM | POA: Diagnosis not present

## 2022-05-08 DIAGNOSIS — N2581 Secondary hyperparathyroidism of renal origin: Secondary | ICD-10-CM | POA: Diagnosis not present

## 2022-05-08 DIAGNOSIS — L299 Pruritus, unspecified: Secondary | ICD-10-CM | POA: Diagnosis not present

## 2022-05-08 DIAGNOSIS — Z5181 Encounter for therapeutic drug level monitoring: Secondary | ICD-10-CM | POA: Diagnosis not present

## 2022-05-08 DIAGNOSIS — D631 Anemia in chronic kidney disease: Secondary | ICD-10-CM | POA: Diagnosis not present

## 2022-05-08 DIAGNOSIS — D689 Coagulation defect, unspecified: Secondary | ICD-10-CM | POA: Diagnosis not present

## 2022-05-08 DIAGNOSIS — Z992 Dependence on renal dialysis: Secondary | ICD-10-CM | POA: Diagnosis not present

## 2022-05-11 DIAGNOSIS — Z992 Dependence on renal dialysis: Secondary | ICD-10-CM | POA: Diagnosis not present

## 2022-05-11 DIAGNOSIS — N2581 Secondary hyperparathyroidism of renal origin: Secondary | ICD-10-CM | POA: Diagnosis not present

## 2022-05-11 DIAGNOSIS — R519 Headache, unspecified: Secondary | ICD-10-CM | POA: Diagnosis not present

## 2022-05-11 DIAGNOSIS — L299 Pruritus, unspecified: Secondary | ICD-10-CM | POA: Diagnosis not present

## 2022-05-11 DIAGNOSIS — N186 End stage renal disease: Secondary | ICD-10-CM | POA: Diagnosis not present

## 2022-05-11 DIAGNOSIS — D689 Coagulation defect, unspecified: Secondary | ICD-10-CM | POA: Diagnosis not present

## 2022-05-11 DIAGNOSIS — Z5181 Encounter for therapeutic drug level monitoring: Secondary | ICD-10-CM | POA: Diagnosis not present

## 2022-05-11 DIAGNOSIS — D631 Anemia in chronic kidney disease: Secondary | ICD-10-CM | POA: Diagnosis not present

## 2022-05-13 DIAGNOSIS — N186 End stage renal disease: Secondary | ICD-10-CM | POA: Diagnosis not present

## 2022-05-13 DIAGNOSIS — N2581 Secondary hyperparathyroidism of renal origin: Secondary | ICD-10-CM | POA: Diagnosis not present

## 2022-05-13 DIAGNOSIS — D689 Coagulation defect, unspecified: Secondary | ICD-10-CM | POA: Diagnosis not present

## 2022-05-13 DIAGNOSIS — Z992 Dependence on renal dialysis: Secondary | ICD-10-CM | POA: Diagnosis not present

## 2022-05-13 DIAGNOSIS — L299 Pruritus, unspecified: Secondary | ICD-10-CM | POA: Diagnosis not present

## 2022-05-13 DIAGNOSIS — D631 Anemia in chronic kidney disease: Secondary | ICD-10-CM | POA: Diagnosis not present

## 2022-05-13 DIAGNOSIS — T8610 Unspecified complication of kidney transplant: Secondary | ICD-10-CM | POA: Diagnosis not present

## 2022-05-13 DIAGNOSIS — R519 Headache, unspecified: Secondary | ICD-10-CM | POA: Diagnosis not present

## 2022-05-13 DIAGNOSIS — Z5181 Encounter for therapeutic drug level monitoring: Secondary | ICD-10-CM | POA: Diagnosis not present

## 2022-05-15 DIAGNOSIS — D631 Anemia in chronic kidney disease: Secondary | ICD-10-CM | POA: Diagnosis not present

## 2022-05-15 DIAGNOSIS — L299 Pruritus, unspecified: Secondary | ICD-10-CM | POA: Diagnosis not present

## 2022-05-15 DIAGNOSIS — Z992 Dependence on renal dialysis: Secondary | ICD-10-CM | POA: Diagnosis not present

## 2022-05-15 DIAGNOSIS — D689 Coagulation defect, unspecified: Secondary | ICD-10-CM | POA: Diagnosis not present

## 2022-05-15 DIAGNOSIS — Z5181 Encounter for therapeutic drug level monitoring: Secondary | ICD-10-CM | POA: Diagnosis not present

## 2022-05-15 DIAGNOSIS — N186 End stage renal disease: Secondary | ICD-10-CM | POA: Diagnosis not present

## 2022-05-15 DIAGNOSIS — N2581 Secondary hyperparathyroidism of renal origin: Secondary | ICD-10-CM | POA: Diagnosis not present

## 2022-05-18 DIAGNOSIS — N186 End stage renal disease: Secondary | ICD-10-CM | POA: Diagnosis not present

## 2022-05-18 DIAGNOSIS — N2581 Secondary hyperparathyroidism of renal origin: Secondary | ICD-10-CM | POA: Diagnosis not present

## 2022-05-18 DIAGNOSIS — Z5181 Encounter for therapeutic drug level monitoring: Secondary | ICD-10-CM | POA: Diagnosis not present

## 2022-05-18 DIAGNOSIS — Z992 Dependence on renal dialysis: Secondary | ICD-10-CM | POA: Diagnosis not present

## 2022-05-18 DIAGNOSIS — L299 Pruritus, unspecified: Secondary | ICD-10-CM | POA: Diagnosis not present

## 2022-05-18 DIAGNOSIS — D689 Coagulation defect, unspecified: Secondary | ICD-10-CM | POA: Diagnosis not present

## 2022-05-18 DIAGNOSIS — D631 Anemia in chronic kidney disease: Secondary | ICD-10-CM | POA: Diagnosis not present

## 2022-05-20 DIAGNOSIS — N2581 Secondary hyperparathyroidism of renal origin: Secondary | ICD-10-CM | POA: Diagnosis not present

## 2022-05-20 DIAGNOSIS — N186 End stage renal disease: Secondary | ICD-10-CM | POA: Diagnosis not present

## 2022-05-20 DIAGNOSIS — L299 Pruritus, unspecified: Secondary | ICD-10-CM | POA: Diagnosis not present

## 2022-05-20 DIAGNOSIS — D631 Anemia in chronic kidney disease: Secondary | ICD-10-CM | POA: Diagnosis not present

## 2022-05-20 DIAGNOSIS — Z992 Dependence on renal dialysis: Secondary | ICD-10-CM | POA: Diagnosis not present

## 2022-05-20 DIAGNOSIS — Z5181 Encounter for therapeutic drug level monitoring: Secondary | ICD-10-CM | POA: Diagnosis not present

## 2022-05-20 DIAGNOSIS — D689 Coagulation defect, unspecified: Secondary | ICD-10-CM | POA: Diagnosis not present

## 2022-05-22 DIAGNOSIS — L299 Pruritus, unspecified: Secondary | ICD-10-CM | POA: Diagnosis not present

## 2022-05-22 DIAGNOSIS — D689 Coagulation defect, unspecified: Secondary | ICD-10-CM | POA: Diagnosis not present

## 2022-05-22 DIAGNOSIS — Z5181 Encounter for therapeutic drug level monitoring: Secondary | ICD-10-CM | POA: Diagnosis not present

## 2022-05-22 DIAGNOSIS — D631 Anemia in chronic kidney disease: Secondary | ICD-10-CM | POA: Diagnosis not present

## 2022-05-22 DIAGNOSIS — N186 End stage renal disease: Secondary | ICD-10-CM | POA: Diagnosis not present

## 2022-05-22 DIAGNOSIS — N2581 Secondary hyperparathyroidism of renal origin: Secondary | ICD-10-CM | POA: Diagnosis not present

## 2022-05-22 DIAGNOSIS — Z992 Dependence on renal dialysis: Secondary | ICD-10-CM | POA: Diagnosis not present

## 2022-05-25 DIAGNOSIS — L299 Pruritus, unspecified: Secondary | ICD-10-CM | POA: Diagnosis not present

## 2022-05-25 DIAGNOSIS — D689 Coagulation defect, unspecified: Secondary | ICD-10-CM | POA: Diagnosis not present

## 2022-05-25 DIAGNOSIS — D631 Anemia in chronic kidney disease: Secondary | ICD-10-CM | POA: Diagnosis not present

## 2022-05-25 DIAGNOSIS — N186 End stage renal disease: Secondary | ICD-10-CM | POA: Diagnosis not present

## 2022-05-25 DIAGNOSIS — Z5181 Encounter for therapeutic drug level monitoring: Secondary | ICD-10-CM | POA: Diagnosis not present

## 2022-05-25 DIAGNOSIS — Z992 Dependence on renal dialysis: Secondary | ICD-10-CM | POA: Diagnosis not present

## 2022-05-25 DIAGNOSIS — N2581 Secondary hyperparathyroidism of renal origin: Secondary | ICD-10-CM | POA: Diagnosis not present

## 2022-05-26 DIAGNOSIS — R931 Abnormal findings on diagnostic imaging of heart and coronary circulation: Secondary | ICD-10-CM | POA: Diagnosis not present

## 2022-05-26 DIAGNOSIS — Z01810 Encounter for preprocedural cardiovascular examination: Secondary | ICD-10-CM | POA: Diagnosis not present

## 2022-05-27 DIAGNOSIS — N186 End stage renal disease: Secondary | ICD-10-CM | POA: Diagnosis not present

## 2022-05-27 DIAGNOSIS — L299 Pruritus, unspecified: Secondary | ICD-10-CM | POA: Diagnosis not present

## 2022-05-27 DIAGNOSIS — Z5181 Encounter for therapeutic drug level monitoring: Secondary | ICD-10-CM | POA: Diagnosis not present

## 2022-05-27 DIAGNOSIS — D631 Anemia in chronic kidney disease: Secondary | ICD-10-CM | POA: Diagnosis not present

## 2022-05-27 DIAGNOSIS — D689 Coagulation defect, unspecified: Secondary | ICD-10-CM | POA: Diagnosis not present

## 2022-05-27 DIAGNOSIS — N2581 Secondary hyperparathyroidism of renal origin: Secondary | ICD-10-CM | POA: Diagnosis not present

## 2022-05-27 DIAGNOSIS — Z992 Dependence on renal dialysis: Secondary | ICD-10-CM | POA: Diagnosis not present

## 2022-05-29 DIAGNOSIS — L299 Pruritus, unspecified: Secondary | ICD-10-CM | POA: Diagnosis not present

## 2022-05-29 DIAGNOSIS — D689 Coagulation defect, unspecified: Secondary | ICD-10-CM | POA: Diagnosis not present

## 2022-05-29 DIAGNOSIS — Z5181 Encounter for therapeutic drug level monitoring: Secondary | ICD-10-CM | POA: Diagnosis not present

## 2022-05-29 DIAGNOSIS — N2581 Secondary hyperparathyroidism of renal origin: Secondary | ICD-10-CM | POA: Diagnosis not present

## 2022-05-29 DIAGNOSIS — Z992 Dependence on renal dialysis: Secondary | ICD-10-CM | POA: Diagnosis not present

## 2022-05-29 DIAGNOSIS — N186 End stage renal disease: Secondary | ICD-10-CM | POA: Diagnosis not present

## 2022-05-29 DIAGNOSIS — D631 Anemia in chronic kidney disease: Secondary | ICD-10-CM | POA: Diagnosis not present

## 2022-06-01 DIAGNOSIS — N2581 Secondary hyperparathyroidism of renal origin: Secondary | ICD-10-CM | POA: Diagnosis not present

## 2022-06-01 DIAGNOSIS — Z5181 Encounter for therapeutic drug level monitoring: Secondary | ICD-10-CM | POA: Diagnosis not present

## 2022-06-01 DIAGNOSIS — D689 Coagulation defect, unspecified: Secondary | ICD-10-CM | POA: Diagnosis not present

## 2022-06-01 DIAGNOSIS — D631 Anemia in chronic kidney disease: Secondary | ICD-10-CM | POA: Diagnosis not present

## 2022-06-01 DIAGNOSIS — L299 Pruritus, unspecified: Secondary | ICD-10-CM | POA: Diagnosis not present

## 2022-06-01 DIAGNOSIS — Z992 Dependence on renal dialysis: Secondary | ICD-10-CM | POA: Diagnosis not present

## 2022-06-01 DIAGNOSIS — N186 End stage renal disease: Secondary | ICD-10-CM | POA: Diagnosis not present

## 2022-06-03 DIAGNOSIS — N2581 Secondary hyperparathyroidism of renal origin: Secondary | ICD-10-CM | POA: Diagnosis not present

## 2022-06-03 DIAGNOSIS — D631 Anemia in chronic kidney disease: Secondary | ICD-10-CM | POA: Diagnosis not present

## 2022-06-03 DIAGNOSIS — Z992 Dependence on renal dialysis: Secondary | ICD-10-CM | POA: Diagnosis not present

## 2022-06-03 DIAGNOSIS — L299 Pruritus, unspecified: Secondary | ICD-10-CM | POA: Diagnosis not present

## 2022-06-03 DIAGNOSIS — D689 Coagulation defect, unspecified: Secondary | ICD-10-CM | POA: Diagnosis not present

## 2022-06-03 DIAGNOSIS — N186 End stage renal disease: Secondary | ICD-10-CM | POA: Diagnosis not present

## 2022-06-03 DIAGNOSIS — Z5181 Encounter for therapeutic drug level monitoring: Secondary | ICD-10-CM | POA: Diagnosis not present

## 2022-06-04 DIAGNOSIS — H2513 Age-related nuclear cataract, bilateral: Secondary | ICD-10-CM | POA: Diagnosis not present

## 2022-06-04 DIAGNOSIS — H11121 Conjunctival concretions, right eye: Secondary | ICD-10-CM | POA: Diagnosis not present

## 2022-06-05 DIAGNOSIS — Z992 Dependence on renal dialysis: Secondary | ICD-10-CM | POA: Diagnosis not present

## 2022-06-05 DIAGNOSIS — N186 End stage renal disease: Secondary | ICD-10-CM | POA: Diagnosis not present

## 2022-06-05 DIAGNOSIS — L299 Pruritus, unspecified: Secondary | ICD-10-CM | POA: Diagnosis not present

## 2022-06-05 DIAGNOSIS — D631 Anemia in chronic kidney disease: Secondary | ICD-10-CM | POA: Diagnosis not present

## 2022-06-05 DIAGNOSIS — N2581 Secondary hyperparathyroidism of renal origin: Secondary | ICD-10-CM | POA: Diagnosis not present

## 2022-06-05 DIAGNOSIS — Z5181 Encounter for therapeutic drug level monitoring: Secondary | ICD-10-CM | POA: Diagnosis not present

## 2022-06-05 DIAGNOSIS — D689 Coagulation defect, unspecified: Secondary | ICD-10-CM | POA: Diagnosis not present

## 2022-06-08 DIAGNOSIS — N186 End stage renal disease: Secondary | ICD-10-CM | POA: Diagnosis not present

## 2022-06-08 DIAGNOSIS — L299 Pruritus, unspecified: Secondary | ICD-10-CM | POA: Diagnosis not present

## 2022-06-08 DIAGNOSIS — D689 Coagulation defect, unspecified: Secondary | ICD-10-CM | POA: Diagnosis not present

## 2022-06-08 DIAGNOSIS — N2581 Secondary hyperparathyroidism of renal origin: Secondary | ICD-10-CM | POA: Diagnosis not present

## 2022-06-08 DIAGNOSIS — Z5181 Encounter for therapeutic drug level monitoring: Secondary | ICD-10-CM | POA: Diagnosis not present

## 2022-06-08 DIAGNOSIS — D631 Anemia in chronic kidney disease: Secondary | ICD-10-CM | POA: Diagnosis not present

## 2022-06-08 DIAGNOSIS — Z992 Dependence on renal dialysis: Secondary | ICD-10-CM | POA: Diagnosis not present

## 2022-06-10 DIAGNOSIS — D631 Anemia in chronic kidney disease: Secondary | ICD-10-CM | POA: Diagnosis not present

## 2022-06-10 DIAGNOSIS — L299 Pruritus, unspecified: Secondary | ICD-10-CM | POA: Diagnosis not present

## 2022-06-10 DIAGNOSIS — D689 Coagulation defect, unspecified: Secondary | ICD-10-CM | POA: Diagnosis not present

## 2022-06-10 DIAGNOSIS — N186 End stage renal disease: Secondary | ICD-10-CM | POA: Diagnosis not present

## 2022-06-10 DIAGNOSIS — Z5181 Encounter for therapeutic drug level monitoring: Secondary | ICD-10-CM | POA: Diagnosis not present

## 2022-06-10 DIAGNOSIS — Z992 Dependence on renal dialysis: Secondary | ICD-10-CM | POA: Diagnosis not present

## 2022-06-10 DIAGNOSIS — N2581 Secondary hyperparathyroidism of renal origin: Secondary | ICD-10-CM | POA: Diagnosis not present

## 2022-06-12 DIAGNOSIS — N186 End stage renal disease: Secondary | ICD-10-CM | POA: Diagnosis not present

## 2022-06-12 DIAGNOSIS — Z992 Dependence on renal dialysis: Secondary | ICD-10-CM | POA: Diagnosis not present

## 2022-06-12 DIAGNOSIS — D631 Anemia in chronic kidney disease: Secondary | ICD-10-CM | POA: Diagnosis not present

## 2022-06-12 DIAGNOSIS — D689 Coagulation defect, unspecified: Secondary | ICD-10-CM | POA: Diagnosis not present

## 2022-06-12 DIAGNOSIS — N2581 Secondary hyperparathyroidism of renal origin: Secondary | ICD-10-CM | POA: Diagnosis not present

## 2022-06-12 DIAGNOSIS — L299 Pruritus, unspecified: Secondary | ICD-10-CM | POA: Diagnosis not present

## 2022-06-12 DIAGNOSIS — Z5181 Encounter for therapeutic drug level monitoring: Secondary | ICD-10-CM | POA: Diagnosis not present

## 2022-06-12 DIAGNOSIS — T8610 Unspecified complication of kidney transplant: Secondary | ICD-10-CM | POA: Diagnosis not present

## 2022-06-15 DIAGNOSIS — D689 Coagulation defect, unspecified: Secondary | ICD-10-CM | POA: Diagnosis not present

## 2022-06-15 DIAGNOSIS — D631 Anemia in chronic kidney disease: Secondary | ICD-10-CM | POA: Diagnosis not present

## 2022-06-15 DIAGNOSIS — Z992 Dependence on renal dialysis: Secondary | ICD-10-CM | POA: Diagnosis not present

## 2022-06-15 DIAGNOSIS — N2581 Secondary hyperparathyroidism of renal origin: Secondary | ICD-10-CM | POA: Diagnosis not present

## 2022-06-15 DIAGNOSIS — L299 Pruritus, unspecified: Secondary | ICD-10-CM | POA: Diagnosis not present

## 2022-06-15 DIAGNOSIS — E875 Hyperkalemia: Secondary | ICD-10-CM | POA: Diagnosis not present

## 2022-06-15 DIAGNOSIS — N186 End stage renal disease: Secondary | ICD-10-CM | POA: Diagnosis not present

## 2022-06-15 DIAGNOSIS — Z5181 Encounter for therapeutic drug level monitoring: Secondary | ICD-10-CM | POA: Diagnosis not present

## 2022-06-15 DIAGNOSIS — R519 Headache, unspecified: Secondary | ICD-10-CM | POA: Diagnosis not present

## 2022-06-17 DIAGNOSIS — Z992 Dependence on renal dialysis: Secondary | ICD-10-CM | POA: Diagnosis not present

## 2022-06-17 DIAGNOSIS — D689 Coagulation defect, unspecified: Secondary | ICD-10-CM | POA: Diagnosis not present

## 2022-06-17 DIAGNOSIS — N186 End stage renal disease: Secondary | ICD-10-CM | POA: Diagnosis not present

## 2022-06-17 DIAGNOSIS — L299 Pruritus, unspecified: Secondary | ICD-10-CM | POA: Diagnosis not present

## 2022-06-17 DIAGNOSIS — D631 Anemia in chronic kidney disease: Secondary | ICD-10-CM | POA: Diagnosis not present

## 2022-06-17 DIAGNOSIS — E875 Hyperkalemia: Secondary | ICD-10-CM | POA: Diagnosis not present

## 2022-06-17 DIAGNOSIS — Z5181 Encounter for therapeutic drug level monitoring: Secondary | ICD-10-CM | POA: Diagnosis not present

## 2022-06-17 DIAGNOSIS — R519 Headache, unspecified: Secondary | ICD-10-CM | POA: Diagnosis not present

## 2022-06-17 DIAGNOSIS — N2581 Secondary hyperparathyroidism of renal origin: Secondary | ICD-10-CM | POA: Diagnosis not present

## 2022-06-19 DIAGNOSIS — E875 Hyperkalemia: Secondary | ICD-10-CM | POA: Diagnosis not present

## 2022-06-19 DIAGNOSIS — Z992 Dependence on renal dialysis: Secondary | ICD-10-CM | POA: Diagnosis not present

## 2022-06-19 DIAGNOSIS — D631 Anemia in chronic kidney disease: Secondary | ICD-10-CM | POA: Diagnosis not present

## 2022-06-19 DIAGNOSIS — N2581 Secondary hyperparathyroidism of renal origin: Secondary | ICD-10-CM | POA: Diagnosis not present

## 2022-06-19 DIAGNOSIS — N186 End stage renal disease: Secondary | ICD-10-CM | POA: Diagnosis not present

## 2022-06-19 DIAGNOSIS — Z5181 Encounter for therapeutic drug level monitoring: Secondary | ICD-10-CM | POA: Diagnosis not present

## 2022-06-19 DIAGNOSIS — D689 Coagulation defect, unspecified: Secondary | ICD-10-CM | POA: Diagnosis not present

## 2022-06-19 DIAGNOSIS — L299 Pruritus, unspecified: Secondary | ICD-10-CM | POA: Diagnosis not present

## 2022-06-19 DIAGNOSIS — R519 Headache, unspecified: Secondary | ICD-10-CM | POA: Diagnosis not present

## 2022-06-22 DIAGNOSIS — N186 End stage renal disease: Secondary | ICD-10-CM | POA: Diagnosis not present

## 2022-06-22 DIAGNOSIS — D631 Anemia in chronic kidney disease: Secondary | ICD-10-CM | POA: Diagnosis not present

## 2022-06-22 DIAGNOSIS — Z5181 Encounter for therapeutic drug level monitoring: Secondary | ICD-10-CM | POA: Diagnosis not present

## 2022-06-22 DIAGNOSIS — E875 Hyperkalemia: Secondary | ICD-10-CM | POA: Diagnosis not present

## 2022-06-22 DIAGNOSIS — D689 Coagulation defect, unspecified: Secondary | ICD-10-CM | POA: Diagnosis not present

## 2022-06-22 DIAGNOSIS — Z992 Dependence on renal dialysis: Secondary | ICD-10-CM | POA: Diagnosis not present

## 2022-06-22 DIAGNOSIS — L299 Pruritus, unspecified: Secondary | ICD-10-CM | POA: Diagnosis not present

## 2022-06-22 DIAGNOSIS — R519 Headache, unspecified: Secondary | ICD-10-CM | POA: Diagnosis not present

## 2022-06-22 DIAGNOSIS — N2581 Secondary hyperparathyroidism of renal origin: Secondary | ICD-10-CM | POA: Diagnosis not present

## 2022-06-22 NOTE — Progress Notes (Unsigned)
[facility] [place] HISTORY AND PHYSICAL   History of Present Illness:  Patient is a 46 y.o. year old male who presents for evaluation of HD access with concerns for pseudoaneurysm.  He has history of  kidney transplant in 2013 that failed and was dialyzed with a catheter.  He then had a revision of his left radiocephalic AV fistula on 12/15/4578.    Past Medical History:  Diagnosis Date   Allergy    Anemia    ESRD (end stage renal disease) on dialysis [facility])    "MWF; [facility]" (12/31/2015)   GERD (gastroesophageal reflux disease)    GI bleed    H/O kidney transplant    Heart murmur    "slight one"   Hematochezia    History of blood transfusion 12/31/2015   "this is my 1st" (12/31/2015)   Hyperkalemia    Hypertension    Renal disorder     Past Surgical History:  Procedure Laterality Date   ARTERIOVENOUS GRAFT PLACEMENT Left 01/2006   forearm   AV FISTULA PLACEMENT Left 02/20/2016   Procedure: REVISION OF LEFT ARTERIOVENOUS (AV) FISTULA;  Surgeon: Angelia Mould, MD;  Location: [place];  Service: Vascular;  Laterality: Left;   AV FISTULA REPAIR  11/2015   "tried unsuccessfully to declot it with a balloon at [facility]"   COLONOSCOPY N/A 01/02/2016   Procedure: COLONOSCOPY;  Surgeon: Carol Ada, MD;  Location: [place];  Service: Endoscopy;  Laterality: N/A;   ESOPHAGOGASTRODUODENOSCOPY N/A 01/02/2016   Procedure: ESOPHAGOGASTRODUODENOSCOPY (EGD);  Surgeon: Carol Ada, MD;  Location: [facility] ENDOSCOPY;  Service: Endoscopy;  Laterality: N/A;   INSERTION OF DIALYSIS CATHETER Right 11/2015   "neck; a temporary one"   INSERTION OF DIALYSIS CATHETER Right 11/2015   chest   KIDNEY TRANSPLANT  2013     Social History Social History   Tobacco Use   Smoking status: Never   Smokeless tobacco: Never  Substance Use Topics   Alcohol use: Yes    Comment: one beer a month   Drug use: No    Family History Family History  Problem Relation Age of  Onset   Hypertension Father    Glaucoma Father    Breast cancer Mother     Allergies  No Known Allergies   Current Outpatient Medications  Medication Sig Dispense Refill   amLODipine (NORVASC) 10 MG tablet Take 10 mg by mouth at bedtime.      amoxicillin-clavulanate (AUGMENTIN) 875-125 MG tablet Take 1 tablet by mouth every 12 (twelve) hours. 14 tablet 0   AURYXIA 1 GM 210 MG(Fe) tablet Take 630 mg by mouth with breakfast, with lunch, and with evening meal.     carvedilol (COREG) 25 MG tablet Take 25 mg by mouth in the morning and at bedtime.     cinacalcet (SENSIPAR) 60 MG tablet Take 60 mg by mouth daily.     labetalol (NORMODYNE) 300 MG tablet Take 600 mg by mouth 2 (two) times daily.  (Patient not taking: Reported on 12/31/2020)     Lanthanum Carbonate 1000 MG PACK Take 2,000 mg by mouth 3 (three) times daily with meals.     Multiple Vitamins-Minerals (RENAPLEX) TABS Take 1 tablet by mouth daily.     omeprazole (PRILOSEC) 20 MG capsule Take 20 mg by mouth daily.     No current facility-administered medications for this visit.    ROS:   General:  No weight loss, Fever, chills  HEENT: No recent headaches, no  nasal bleeding, no visual changes, no sore throat  Neurologic: No dizziness, blackouts, seizures. No recent symptoms of stroke or mini- stroke. No recent episodes of slurred speech, or temporary blindness.  Cardiac: No recent episodes of chest pain/pressure, no shortness of breath at rest.  No shortness of breath with exertion.  Denies history of atrial fibrillation or irregular heartbeat  Vascular: No history of rest pain in feet.  No history of claudication.  No history of non-healing ulcer, No history of DVT   Pulmonary: No home oxygen, no productive cough, no hemoptysis,  No asthma or wheezing  Musculoskeletal:  [ ]  Arthritis, [ ]  Low back pain,  [ ]  Joint pain  Hematologic:No history of hypercoagulable state.  No history of easy bleeding.  No history of  anemia  Gastrointestinal: No hematochezia or melena,  No gastroesophageal reflux, no trouble swallowing  Urinary: [ ]  chronic Kidney disease, [ ]  on HD - [ ]  MWF or [ ]  TTHS, [ ]  Burning with urination, [ ]  Frequent urination, [ ]  Difficulty urinating;   Skin: No rashes  Psychological: No history of anxiety,  No history of depression   Physical Examination  There were no vitals filed for this visit.  There is no height or weight on file to calculate BMI.  General:  Alert and oriented, no acute distress HEENT: Normal Neck: No bruit or JVD Pulmonary: Clear to auscultation bilaterally Cardiac: Regular Rate and Rhythm without murmur Gastrointestinal: Soft, non-tender, non-distended, no mass, no scars Skin: No rash Extremity Pulses:  2+ radial, brachial pulses bilaterally Musculoskeletal: No deformity or edema  Neurologic: Upper and lower extremity motor 5/5 and symmetric  DATA:    ASSESSMENT:    PLAN:  Ruta Hinds, MD [facility] [place] Office: [phone] Pager: [phone]

## 2022-06-23 ENCOUNTER — Ambulatory Visit: Payer: Medicare Other | Admitting: Physician Assistant

## 2022-06-23 VITALS — BP 110/72 | HR 70 | Temp 98.0°F | Resp 18 | Ht 70.0 in | Wt 240.1 lb

## 2022-06-23 DIAGNOSIS — T82898D Other specified complication of vascular prosthetic devices, implants and grafts, subsequent encounter: Secondary | ICD-10-CM

## 2022-06-23 DIAGNOSIS — Z992 Dependence on renal dialysis: Secondary | ICD-10-CM

## 2022-06-23 DIAGNOSIS — N186 End stage renal disease: Secondary | ICD-10-CM

## 2022-06-24 DIAGNOSIS — Z992 Dependence on renal dialysis: Secondary | ICD-10-CM | POA: Diagnosis not present

## 2022-06-24 DIAGNOSIS — L299 Pruritus, unspecified: Secondary | ICD-10-CM | POA: Diagnosis not present

## 2022-06-24 DIAGNOSIS — Z5181 Encounter for therapeutic drug level monitoring: Secondary | ICD-10-CM | POA: Diagnosis not present

## 2022-06-24 DIAGNOSIS — E875 Hyperkalemia: Secondary | ICD-10-CM | POA: Diagnosis not present

## 2022-06-24 DIAGNOSIS — D689 Coagulation defect, unspecified: Secondary | ICD-10-CM | POA: Diagnosis not present

## 2022-06-24 DIAGNOSIS — R519 Headache, unspecified: Secondary | ICD-10-CM | POA: Diagnosis not present

## 2022-06-24 DIAGNOSIS — N186 End stage renal disease: Secondary | ICD-10-CM | POA: Diagnosis not present

## 2022-06-24 DIAGNOSIS — D631 Anemia in chronic kidney disease: Secondary | ICD-10-CM | POA: Diagnosis not present

## 2022-06-24 DIAGNOSIS — N2581 Secondary hyperparathyroidism of renal origin: Secondary | ICD-10-CM | POA: Diagnosis not present

## 2022-06-26 DIAGNOSIS — L299 Pruritus, unspecified: Secondary | ICD-10-CM | POA: Diagnosis not present

## 2022-06-26 DIAGNOSIS — Z992 Dependence on renal dialysis: Secondary | ICD-10-CM | POA: Diagnosis not present

## 2022-06-26 DIAGNOSIS — D631 Anemia in chronic kidney disease: Secondary | ICD-10-CM | POA: Diagnosis not present

## 2022-06-26 DIAGNOSIS — R519 Headache, unspecified: Secondary | ICD-10-CM | POA: Diagnosis not present

## 2022-06-26 DIAGNOSIS — N186 End stage renal disease: Secondary | ICD-10-CM | POA: Diagnosis not present

## 2022-06-26 DIAGNOSIS — E875 Hyperkalemia: Secondary | ICD-10-CM | POA: Diagnosis not present

## 2022-06-26 DIAGNOSIS — N2581 Secondary hyperparathyroidism of renal origin: Secondary | ICD-10-CM | POA: Diagnosis not present

## 2022-06-26 DIAGNOSIS — Z5181 Encounter for therapeutic drug level monitoring: Secondary | ICD-10-CM | POA: Diagnosis not present

## 2022-06-26 DIAGNOSIS — D689 Coagulation defect, unspecified: Secondary | ICD-10-CM | POA: Diagnosis not present

## 2022-06-29 DIAGNOSIS — N2581 Secondary hyperparathyroidism of renal origin: Secondary | ICD-10-CM | POA: Diagnosis not present

## 2022-06-29 DIAGNOSIS — Z5181 Encounter for therapeutic drug level monitoring: Secondary | ICD-10-CM | POA: Diagnosis not present

## 2022-06-29 DIAGNOSIS — N186 End stage renal disease: Secondary | ICD-10-CM | POA: Diagnosis not present

## 2022-06-29 DIAGNOSIS — Z992 Dependence on renal dialysis: Secondary | ICD-10-CM | POA: Diagnosis not present

## 2022-06-29 DIAGNOSIS — L299 Pruritus, unspecified: Secondary | ICD-10-CM | POA: Diagnosis not present

## 2022-06-29 DIAGNOSIS — R519 Headache, unspecified: Secondary | ICD-10-CM | POA: Diagnosis not present

## 2022-06-29 DIAGNOSIS — D689 Coagulation defect, unspecified: Secondary | ICD-10-CM | POA: Diagnosis not present

## 2022-06-29 DIAGNOSIS — D631 Anemia in chronic kidney disease: Secondary | ICD-10-CM | POA: Diagnosis not present

## 2022-06-29 DIAGNOSIS — E875 Hyperkalemia: Secondary | ICD-10-CM | POA: Diagnosis not present

## 2022-07-01 DIAGNOSIS — D689 Coagulation defect, unspecified: Secondary | ICD-10-CM | POA: Diagnosis not present

## 2022-07-01 DIAGNOSIS — R519 Headache, unspecified: Secondary | ICD-10-CM | POA: Diagnosis not present

## 2022-07-01 DIAGNOSIS — Z992 Dependence on renal dialysis: Secondary | ICD-10-CM | POA: Diagnosis not present

## 2022-07-01 DIAGNOSIS — D631 Anemia in chronic kidney disease: Secondary | ICD-10-CM | POA: Diagnosis not present

## 2022-07-01 DIAGNOSIS — N2581 Secondary hyperparathyroidism of renal origin: Secondary | ICD-10-CM | POA: Diagnosis not present

## 2022-07-01 DIAGNOSIS — N186 End stage renal disease: Secondary | ICD-10-CM | POA: Diagnosis not present

## 2022-07-01 DIAGNOSIS — L299 Pruritus, unspecified: Secondary | ICD-10-CM | POA: Diagnosis not present

## 2022-07-01 DIAGNOSIS — Z5181 Encounter for therapeutic drug level monitoring: Secondary | ICD-10-CM | POA: Diagnosis not present

## 2022-07-01 DIAGNOSIS — E875 Hyperkalemia: Secondary | ICD-10-CM | POA: Diagnosis not present

## 2022-07-03 DIAGNOSIS — N2581 Secondary hyperparathyroidism of renal origin: Secondary | ICD-10-CM | POA: Diagnosis not present

## 2022-07-03 DIAGNOSIS — L299 Pruritus, unspecified: Secondary | ICD-10-CM | POA: Diagnosis not present

## 2022-07-03 DIAGNOSIS — D631 Anemia in chronic kidney disease: Secondary | ICD-10-CM | POA: Diagnosis not present

## 2022-07-03 DIAGNOSIS — D689 Coagulation defect, unspecified: Secondary | ICD-10-CM | POA: Diagnosis not present

## 2022-07-03 DIAGNOSIS — N186 End stage renal disease: Secondary | ICD-10-CM | POA: Diagnosis not present

## 2022-07-03 DIAGNOSIS — E875 Hyperkalemia: Secondary | ICD-10-CM | POA: Diagnosis not present

## 2022-07-03 DIAGNOSIS — R519 Headache, unspecified: Secondary | ICD-10-CM | POA: Diagnosis not present

## 2022-07-03 DIAGNOSIS — Z992 Dependence on renal dialysis: Secondary | ICD-10-CM | POA: Diagnosis not present

## 2022-07-03 DIAGNOSIS — Z5181 Encounter for therapeutic drug level monitoring: Secondary | ICD-10-CM | POA: Diagnosis not present

## 2022-07-06 DIAGNOSIS — D689 Coagulation defect, unspecified: Secondary | ICD-10-CM | POA: Diagnosis not present

## 2022-07-06 DIAGNOSIS — R519 Headache, unspecified: Secondary | ICD-10-CM | POA: Diagnosis not present

## 2022-07-06 DIAGNOSIS — D631 Anemia in chronic kidney disease: Secondary | ICD-10-CM | POA: Diagnosis not present

## 2022-07-06 DIAGNOSIS — E875 Hyperkalemia: Secondary | ICD-10-CM | POA: Diagnosis not present

## 2022-07-06 DIAGNOSIS — L299 Pruritus, unspecified: Secondary | ICD-10-CM | POA: Diagnosis not present

## 2022-07-06 DIAGNOSIS — N2581 Secondary hyperparathyroidism of renal origin: Secondary | ICD-10-CM | POA: Diagnosis not present

## 2022-07-06 DIAGNOSIS — N186 End stage renal disease: Secondary | ICD-10-CM | POA: Diagnosis not present

## 2022-07-06 DIAGNOSIS — Z992 Dependence on renal dialysis: Secondary | ICD-10-CM | POA: Diagnosis not present

## 2022-07-06 DIAGNOSIS — Z5181 Encounter for therapeutic drug level monitoring: Secondary | ICD-10-CM | POA: Diagnosis not present

## 2022-07-08 DIAGNOSIS — N2581 Secondary hyperparathyroidism of renal origin: Secondary | ICD-10-CM | POA: Diagnosis not present

## 2022-07-08 DIAGNOSIS — Z5181 Encounter for therapeutic drug level monitoring: Secondary | ICD-10-CM | POA: Diagnosis not present

## 2022-07-08 DIAGNOSIS — Z992 Dependence on renal dialysis: Secondary | ICD-10-CM | POA: Diagnosis not present

## 2022-07-08 DIAGNOSIS — L299 Pruritus, unspecified: Secondary | ICD-10-CM | POA: Diagnosis not present

## 2022-07-08 DIAGNOSIS — N186 End stage renal disease: Secondary | ICD-10-CM | POA: Diagnosis not present

## 2022-07-08 DIAGNOSIS — E875 Hyperkalemia: Secondary | ICD-10-CM | POA: Diagnosis not present

## 2022-07-08 DIAGNOSIS — D689 Coagulation defect, unspecified: Secondary | ICD-10-CM | POA: Diagnosis not present

## 2022-07-08 DIAGNOSIS — D631 Anemia in chronic kidney disease: Secondary | ICD-10-CM | POA: Diagnosis not present

## 2022-07-08 DIAGNOSIS — R519 Headache, unspecified: Secondary | ICD-10-CM | POA: Diagnosis not present

## 2022-07-10 DIAGNOSIS — Z5181 Encounter for therapeutic drug level monitoring: Secondary | ICD-10-CM | POA: Diagnosis not present

## 2022-07-10 DIAGNOSIS — E875 Hyperkalemia: Secondary | ICD-10-CM | POA: Diagnosis not present

## 2022-07-10 DIAGNOSIS — D689 Coagulation defect, unspecified: Secondary | ICD-10-CM | POA: Diagnosis not present

## 2022-07-10 DIAGNOSIS — N2581 Secondary hyperparathyroidism of renal origin: Secondary | ICD-10-CM | POA: Diagnosis not present

## 2022-07-10 DIAGNOSIS — D631 Anemia in chronic kidney disease: Secondary | ICD-10-CM | POA: Diagnosis not present

## 2022-07-10 DIAGNOSIS — N186 End stage renal disease: Secondary | ICD-10-CM | POA: Diagnosis not present

## 2022-07-10 DIAGNOSIS — L299 Pruritus, unspecified: Secondary | ICD-10-CM | POA: Diagnosis not present

## 2022-07-10 DIAGNOSIS — R519 Headache, unspecified: Secondary | ICD-10-CM | POA: Diagnosis not present

## 2022-07-10 DIAGNOSIS — Z992 Dependence on renal dialysis: Secondary | ICD-10-CM | POA: Diagnosis not present

## 2022-07-13 DIAGNOSIS — Z992 Dependence on renal dialysis: Secondary | ICD-10-CM | POA: Diagnosis not present

## 2022-07-13 DIAGNOSIS — D631 Anemia in chronic kidney disease: Secondary | ICD-10-CM | POA: Diagnosis not present

## 2022-07-13 DIAGNOSIS — N186 End stage renal disease: Secondary | ICD-10-CM | POA: Diagnosis not present

## 2022-07-13 DIAGNOSIS — D689 Coagulation defect, unspecified: Secondary | ICD-10-CM | POA: Diagnosis not present

## 2022-07-13 DIAGNOSIS — Z5181 Encounter for therapeutic drug level monitoring: Secondary | ICD-10-CM | POA: Diagnosis not present

## 2022-07-13 DIAGNOSIS — R519 Headache, unspecified: Secondary | ICD-10-CM | POA: Diagnosis not present

## 2022-07-13 DIAGNOSIS — E875 Hyperkalemia: Secondary | ICD-10-CM | POA: Diagnosis not present

## 2022-07-13 DIAGNOSIS — N2581 Secondary hyperparathyroidism of renal origin: Secondary | ICD-10-CM | POA: Diagnosis not present

## 2022-07-13 DIAGNOSIS — L299 Pruritus, unspecified: Secondary | ICD-10-CM | POA: Diagnosis not present

## 2022-07-15 DIAGNOSIS — Z992 Dependence on renal dialysis: Secondary | ICD-10-CM | POA: Diagnosis not present

## 2022-07-15 DIAGNOSIS — N2581 Secondary hyperparathyroidism of renal origin: Secondary | ICD-10-CM | POA: Diagnosis not present

## 2022-07-15 DIAGNOSIS — N186 End stage renal disease: Secondary | ICD-10-CM | POA: Diagnosis not present

## 2022-07-15 DIAGNOSIS — D631 Anemia in chronic kidney disease: Secondary | ICD-10-CM | POA: Diagnosis not present

## 2022-07-15 DIAGNOSIS — Z5181 Encounter for therapeutic drug level monitoring: Secondary | ICD-10-CM | POA: Diagnosis not present

## 2022-07-15 DIAGNOSIS — L299 Pruritus, unspecified: Secondary | ICD-10-CM | POA: Diagnosis not present

## 2022-07-15 DIAGNOSIS — D689 Coagulation defect, unspecified: Secondary | ICD-10-CM | POA: Diagnosis not present

## 2022-07-17 DIAGNOSIS — L299 Pruritus, unspecified: Secondary | ICD-10-CM | POA: Diagnosis not present

## 2022-07-17 DIAGNOSIS — N2581 Secondary hyperparathyroidism of renal origin: Secondary | ICD-10-CM | POA: Diagnosis not present

## 2022-07-17 DIAGNOSIS — N186 End stage renal disease: Secondary | ICD-10-CM | POA: Diagnosis not present

## 2022-07-17 DIAGNOSIS — D631 Anemia in chronic kidney disease: Secondary | ICD-10-CM | POA: Diagnosis not present

## 2022-07-17 DIAGNOSIS — D689 Coagulation defect, unspecified: Secondary | ICD-10-CM | POA: Diagnosis not present

## 2022-07-17 DIAGNOSIS — Z992 Dependence on renal dialysis: Secondary | ICD-10-CM | POA: Diagnosis not present

## 2022-07-17 DIAGNOSIS — Z5181 Encounter for therapeutic drug level monitoring: Secondary | ICD-10-CM | POA: Diagnosis not present

## 2022-07-20 DIAGNOSIS — N2581 Secondary hyperparathyroidism of renal origin: Secondary | ICD-10-CM | POA: Diagnosis not present

## 2022-07-20 DIAGNOSIS — Z5181 Encounter for therapeutic drug level monitoring: Secondary | ICD-10-CM | POA: Diagnosis not present

## 2022-07-20 DIAGNOSIS — N186 End stage renal disease: Secondary | ICD-10-CM | POA: Diagnosis not present

## 2022-07-20 DIAGNOSIS — Z992 Dependence on renal dialysis: Secondary | ICD-10-CM | POA: Diagnosis not present

## 2022-07-20 DIAGNOSIS — D631 Anemia in chronic kidney disease: Secondary | ICD-10-CM | POA: Diagnosis not present

## 2022-07-20 DIAGNOSIS — L299 Pruritus, unspecified: Secondary | ICD-10-CM | POA: Diagnosis not present

## 2022-07-20 DIAGNOSIS — D689 Coagulation defect, unspecified: Secondary | ICD-10-CM | POA: Diagnosis not present

## 2022-07-22 DIAGNOSIS — Z5181 Encounter for therapeutic drug level monitoring: Secondary | ICD-10-CM | POA: Diagnosis not present

## 2022-07-22 DIAGNOSIS — N2581 Secondary hyperparathyroidism of renal origin: Secondary | ICD-10-CM | POA: Diagnosis not present

## 2022-07-22 DIAGNOSIS — L299 Pruritus, unspecified: Secondary | ICD-10-CM | POA: Diagnosis not present

## 2022-07-22 DIAGNOSIS — D631 Anemia in chronic kidney disease: Secondary | ICD-10-CM | POA: Diagnosis not present

## 2022-07-22 DIAGNOSIS — Z992 Dependence on renal dialysis: Secondary | ICD-10-CM | POA: Diagnosis not present

## 2022-07-22 DIAGNOSIS — N186 End stage renal disease: Secondary | ICD-10-CM | POA: Diagnosis not present

## 2022-07-22 DIAGNOSIS — D689 Coagulation defect, unspecified: Secondary | ICD-10-CM | POA: Diagnosis not present

## 2022-07-24 DIAGNOSIS — N2581 Secondary hyperparathyroidism of renal origin: Secondary | ICD-10-CM | POA: Diagnosis not present

## 2022-07-24 DIAGNOSIS — Z5181 Encounter for therapeutic drug level monitoring: Secondary | ICD-10-CM | POA: Diagnosis not present

## 2022-07-24 DIAGNOSIS — D689 Coagulation defect, unspecified: Secondary | ICD-10-CM | POA: Diagnosis not present

## 2022-07-24 DIAGNOSIS — L299 Pruritus, unspecified: Secondary | ICD-10-CM | POA: Diagnosis not present

## 2022-07-24 DIAGNOSIS — N186 End stage renal disease: Secondary | ICD-10-CM | POA: Diagnosis not present

## 2022-07-24 DIAGNOSIS — Z992 Dependence on renal dialysis: Secondary | ICD-10-CM | POA: Diagnosis not present

## 2022-07-24 DIAGNOSIS — D631 Anemia in chronic kidney disease: Secondary | ICD-10-CM | POA: Diagnosis not present

## 2022-07-27 DIAGNOSIS — L299 Pruritus, unspecified: Secondary | ICD-10-CM | POA: Diagnosis not present

## 2022-07-27 DIAGNOSIS — D631 Anemia in chronic kidney disease: Secondary | ICD-10-CM | POA: Diagnosis not present

## 2022-07-27 DIAGNOSIS — Z5181 Encounter for therapeutic drug level monitoring: Secondary | ICD-10-CM | POA: Diagnosis not present

## 2022-07-27 DIAGNOSIS — D689 Coagulation defect, unspecified: Secondary | ICD-10-CM | POA: Diagnosis not present

## 2022-07-27 DIAGNOSIS — N186 End stage renal disease: Secondary | ICD-10-CM | POA: Diagnosis not present

## 2022-07-27 DIAGNOSIS — N2581 Secondary hyperparathyroidism of renal origin: Secondary | ICD-10-CM | POA: Diagnosis not present

## 2022-07-27 DIAGNOSIS — Z992 Dependence on renal dialysis: Secondary | ICD-10-CM | POA: Diagnosis not present

## 2022-07-29 DIAGNOSIS — D631 Anemia in chronic kidney disease: Secondary | ICD-10-CM | POA: Diagnosis not present

## 2022-07-29 DIAGNOSIS — N2581 Secondary hyperparathyroidism of renal origin: Secondary | ICD-10-CM | POA: Diagnosis not present

## 2022-07-29 DIAGNOSIS — N186 End stage renal disease: Secondary | ICD-10-CM | POA: Diagnosis not present

## 2022-07-29 DIAGNOSIS — Z992 Dependence on renal dialysis: Secondary | ICD-10-CM | POA: Diagnosis not present

## 2022-07-29 DIAGNOSIS — L299 Pruritus, unspecified: Secondary | ICD-10-CM | POA: Diagnosis not present

## 2022-07-29 DIAGNOSIS — D689 Coagulation defect, unspecified: Secondary | ICD-10-CM | POA: Diagnosis not present

## 2022-07-29 DIAGNOSIS — Z5181 Encounter for therapeutic drug level monitoring: Secondary | ICD-10-CM | POA: Diagnosis not present

## 2022-07-31 DIAGNOSIS — D631 Anemia in chronic kidney disease: Secondary | ICD-10-CM | POA: Diagnosis not present

## 2022-07-31 DIAGNOSIS — N186 End stage renal disease: Secondary | ICD-10-CM | POA: Diagnosis not present

## 2022-07-31 DIAGNOSIS — L299 Pruritus, unspecified: Secondary | ICD-10-CM | POA: Diagnosis not present

## 2022-07-31 DIAGNOSIS — Z5181 Encounter for therapeutic drug level monitoring: Secondary | ICD-10-CM | POA: Diagnosis not present

## 2022-07-31 DIAGNOSIS — D689 Coagulation defect, unspecified: Secondary | ICD-10-CM | POA: Diagnosis not present

## 2022-07-31 DIAGNOSIS — N2581 Secondary hyperparathyroidism of renal origin: Secondary | ICD-10-CM | POA: Diagnosis not present

## 2022-07-31 DIAGNOSIS — Z992 Dependence on renal dialysis: Secondary | ICD-10-CM | POA: Diagnosis not present

## 2022-08-03 DIAGNOSIS — Z992 Dependence on renal dialysis: Secondary | ICD-10-CM | POA: Diagnosis not present

## 2022-08-03 DIAGNOSIS — D689 Coagulation defect, unspecified: Secondary | ICD-10-CM | POA: Diagnosis not present

## 2022-08-03 DIAGNOSIS — L299 Pruritus, unspecified: Secondary | ICD-10-CM | POA: Diagnosis not present

## 2022-08-03 DIAGNOSIS — N186 End stage renal disease: Secondary | ICD-10-CM | POA: Diagnosis not present

## 2022-08-03 DIAGNOSIS — N2581 Secondary hyperparathyroidism of renal origin: Secondary | ICD-10-CM | POA: Diagnosis not present

## 2022-08-03 DIAGNOSIS — Z5181 Encounter for therapeutic drug level monitoring: Secondary | ICD-10-CM | POA: Diagnosis not present

## 2022-08-03 DIAGNOSIS — D631 Anemia in chronic kidney disease: Secondary | ICD-10-CM | POA: Diagnosis not present

## 2022-08-05 DIAGNOSIS — N186 End stage renal disease: Secondary | ICD-10-CM | POA: Diagnosis not present

## 2022-08-05 DIAGNOSIS — D689 Coagulation defect, unspecified: Secondary | ICD-10-CM | POA: Diagnosis not present

## 2022-08-05 DIAGNOSIS — N2581 Secondary hyperparathyroidism of renal origin: Secondary | ICD-10-CM | POA: Diagnosis not present

## 2022-08-05 DIAGNOSIS — Z5181 Encounter for therapeutic drug level monitoring: Secondary | ICD-10-CM | POA: Diagnosis not present

## 2022-08-05 DIAGNOSIS — Z992 Dependence on renal dialysis: Secondary | ICD-10-CM | POA: Diagnosis not present

## 2022-08-05 DIAGNOSIS — L299 Pruritus, unspecified: Secondary | ICD-10-CM | POA: Diagnosis not present

## 2022-08-05 DIAGNOSIS — D631 Anemia in chronic kidney disease: Secondary | ICD-10-CM | POA: Diagnosis not present

## 2022-08-07 DIAGNOSIS — L299 Pruritus, unspecified: Secondary | ICD-10-CM | POA: Diagnosis not present

## 2022-08-07 DIAGNOSIS — Z992 Dependence on renal dialysis: Secondary | ICD-10-CM | POA: Diagnosis not present

## 2022-08-07 DIAGNOSIS — Z5181 Encounter for therapeutic drug level monitoring: Secondary | ICD-10-CM | POA: Diagnosis not present

## 2022-08-07 DIAGNOSIS — N2581 Secondary hyperparathyroidism of renal origin: Secondary | ICD-10-CM | POA: Diagnosis not present

## 2022-08-07 DIAGNOSIS — D689 Coagulation defect, unspecified: Secondary | ICD-10-CM | POA: Diagnosis not present

## 2022-08-07 DIAGNOSIS — D631 Anemia in chronic kidney disease: Secondary | ICD-10-CM | POA: Diagnosis not present

## 2022-08-07 DIAGNOSIS — N186 End stage renal disease: Secondary | ICD-10-CM | POA: Diagnosis not present

## 2022-08-10 DIAGNOSIS — Z992 Dependence on renal dialysis: Secondary | ICD-10-CM | POA: Diagnosis not present

## 2022-08-10 DIAGNOSIS — N186 End stage renal disease: Secondary | ICD-10-CM | POA: Diagnosis not present

## 2022-08-10 DIAGNOSIS — D689 Coagulation defect, unspecified: Secondary | ICD-10-CM | POA: Diagnosis not present

## 2022-08-10 DIAGNOSIS — D631 Anemia in chronic kidney disease: Secondary | ICD-10-CM | POA: Diagnosis not present

## 2022-08-10 DIAGNOSIS — Z5181 Encounter for therapeutic drug level monitoring: Secondary | ICD-10-CM | POA: Diagnosis not present

## 2022-08-10 DIAGNOSIS — N2581 Secondary hyperparathyroidism of renal origin: Secondary | ICD-10-CM | POA: Diagnosis not present

## 2022-08-10 DIAGNOSIS — L299 Pruritus, unspecified: Secondary | ICD-10-CM | POA: Diagnosis not present

## 2022-08-12 DIAGNOSIS — L299 Pruritus, unspecified: Secondary | ICD-10-CM | POA: Diagnosis not present

## 2022-08-12 DIAGNOSIS — D689 Coagulation defect, unspecified: Secondary | ICD-10-CM | POA: Diagnosis not present

## 2022-08-12 DIAGNOSIS — D631 Anemia in chronic kidney disease: Secondary | ICD-10-CM | POA: Diagnosis not present

## 2022-08-12 DIAGNOSIS — N2581 Secondary hyperparathyroidism of renal origin: Secondary | ICD-10-CM | POA: Diagnosis not present

## 2022-08-12 DIAGNOSIS — N186 End stage renal disease: Secondary | ICD-10-CM | POA: Diagnosis not present

## 2022-08-12 DIAGNOSIS — Z5181 Encounter for therapeutic drug level monitoring: Secondary | ICD-10-CM | POA: Diagnosis not present

## 2022-08-12 DIAGNOSIS — Z992 Dependence on renal dialysis: Secondary | ICD-10-CM | POA: Diagnosis not present

## 2022-08-13 DIAGNOSIS — Z992 Dependence on renal dialysis: Secondary | ICD-10-CM | POA: Diagnosis not present

## 2022-08-13 DIAGNOSIS — N186 End stage renal disease: Secondary | ICD-10-CM | POA: Diagnosis not present

## 2022-08-13 DIAGNOSIS — T8610 Unspecified complication of kidney transplant: Secondary | ICD-10-CM | POA: Diagnosis not present

## 2022-08-14 DIAGNOSIS — N186 End stage renal disease: Secondary | ICD-10-CM | POA: Diagnosis not present

## 2022-08-14 DIAGNOSIS — N2581 Secondary hyperparathyroidism of renal origin: Secondary | ICD-10-CM | POA: Diagnosis not present

## 2022-08-14 DIAGNOSIS — Z992 Dependence on renal dialysis: Secondary | ICD-10-CM | POA: Diagnosis not present

## 2022-08-14 DIAGNOSIS — Z5181 Encounter for therapeutic drug level monitoring: Secondary | ICD-10-CM | POA: Diagnosis not present

## 2022-08-14 DIAGNOSIS — D689 Coagulation defect, unspecified: Secondary | ICD-10-CM | POA: Diagnosis not present

## 2022-08-14 DIAGNOSIS — L299 Pruritus, unspecified: Secondary | ICD-10-CM | POA: Diagnosis not present

## 2022-08-14 DIAGNOSIS — D631 Anemia in chronic kidney disease: Secondary | ICD-10-CM | POA: Diagnosis not present

## 2022-08-14 DIAGNOSIS — T7840XA Allergy, unspecified, initial encounter: Secondary | ICD-10-CM | POA: Diagnosis not present

## 2022-08-17 DIAGNOSIS — Z5181 Encounter for therapeutic drug level monitoring: Secondary | ICD-10-CM | POA: Diagnosis not present

## 2022-08-17 DIAGNOSIS — D689 Coagulation defect, unspecified: Secondary | ICD-10-CM | POA: Diagnosis not present

## 2022-08-17 DIAGNOSIS — N2581 Secondary hyperparathyroidism of renal origin: Secondary | ICD-10-CM | POA: Diagnosis not present

## 2022-08-17 DIAGNOSIS — T7840XA Allergy, unspecified, initial encounter: Secondary | ICD-10-CM | POA: Diagnosis not present

## 2022-08-17 DIAGNOSIS — N186 End stage renal disease: Secondary | ICD-10-CM | POA: Diagnosis not present

## 2022-08-17 DIAGNOSIS — D631 Anemia in chronic kidney disease: Secondary | ICD-10-CM | POA: Diagnosis not present

## 2022-08-17 DIAGNOSIS — Z992 Dependence on renal dialysis: Secondary | ICD-10-CM | POA: Diagnosis not present

## 2022-08-17 DIAGNOSIS — L299 Pruritus, unspecified: Secondary | ICD-10-CM | POA: Diagnosis not present

## 2022-08-19 DIAGNOSIS — D631 Anemia in chronic kidney disease: Secondary | ICD-10-CM | POA: Diagnosis not present

## 2022-08-19 DIAGNOSIS — N2581 Secondary hyperparathyroidism of renal origin: Secondary | ICD-10-CM | POA: Diagnosis not present

## 2022-08-19 DIAGNOSIS — N186 End stage renal disease: Secondary | ICD-10-CM | POA: Diagnosis not present

## 2022-08-19 DIAGNOSIS — Z992 Dependence on renal dialysis: Secondary | ICD-10-CM | POA: Diagnosis not present

## 2022-08-19 DIAGNOSIS — Z5181 Encounter for therapeutic drug level monitoring: Secondary | ICD-10-CM | POA: Diagnosis not present

## 2022-08-19 DIAGNOSIS — D689 Coagulation defect, unspecified: Secondary | ICD-10-CM | POA: Diagnosis not present

## 2022-08-19 DIAGNOSIS — T7840XA Allergy, unspecified, initial encounter: Secondary | ICD-10-CM | POA: Diagnosis not present

## 2022-08-19 DIAGNOSIS — L299 Pruritus, unspecified: Secondary | ICD-10-CM | POA: Diagnosis not present

## 2022-08-21 DIAGNOSIS — T7840XA Allergy, unspecified, initial encounter: Secondary | ICD-10-CM | POA: Diagnosis not present

## 2022-08-21 DIAGNOSIS — Z5181 Encounter for therapeutic drug level monitoring: Secondary | ICD-10-CM | POA: Diagnosis not present

## 2022-08-21 DIAGNOSIS — N2581 Secondary hyperparathyroidism of renal origin: Secondary | ICD-10-CM | POA: Diagnosis not present

## 2022-08-21 DIAGNOSIS — D689 Coagulation defect, unspecified: Secondary | ICD-10-CM | POA: Diagnosis not present

## 2022-08-21 DIAGNOSIS — Z992 Dependence on renal dialysis: Secondary | ICD-10-CM | POA: Diagnosis not present

## 2022-08-21 DIAGNOSIS — N186 End stage renal disease: Secondary | ICD-10-CM | POA: Diagnosis not present

## 2022-08-21 DIAGNOSIS — L299 Pruritus, unspecified: Secondary | ICD-10-CM | POA: Diagnosis not present

## 2022-08-21 DIAGNOSIS — D631 Anemia in chronic kidney disease: Secondary | ICD-10-CM | POA: Diagnosis not present

## 2022-08-24 DIAGNOSIS — D631 Anemia in chronic kidney disease: Secondary | ICD-10-CM | POA: Diagnosis not present

## 2022-08-24 DIAGNOSIS — N186 End stage renal disease: Secondary | ICD-10-CM | POA: Diagnosis not present

## 2022-08-24 DIAGNOSIS — N2581 Secondary hyperparathyroidism of renal origin: Secondary | ICD-10-CM | POA: Diagnosis not present

## 2022-08-24 DIAGNOSIS — Z5181 Encounter for therapeutic drug level monitoring: Secondary | ICD-10-CM | POA: Diagnosis not present

## 2022-08-24 DIAGNOSIS — Z992 Dependence on renal dialysis: Secondary | ICD-10-CM | POA: Diagnosis not present

## 2022-08-24 DIAGNOSIS — T7840XA Allergy, unspecified, initial encounter: Secondary | ICD-10-CM | POA: Diagnosis not present

## 2022-08-24 DIAGNOSIS — D689 Coagulation defect, unspecified: Secondary | ICD-10-CM | POA: Diagnosis not present

## 2022-08-24 DIAGNOSIS — L299 Pruritus, unspecified: Secondary | ICD-10-CM | POA: Diagnosis not present

## 2022-08-25 DIAGNOSIS — R590 Localized enlarged lymph nodes: Secondary | ICD-10-CM | POA: Diagnosis not present

## 2022-08-26 DIAGNOSIS — N186 End stage renal disease: Secondary | ICD-10-CM | POA: Diagnosis not present

## 2022-08-26 DIAGNOSIS — D631 Anemia in chronic kidney disease: Secondary | ICD-10-CM | POA: Diagnosis not present

## 2022-08-26 DIAGNOSIS — D689 Coagulation defect, unspecified: Secondary | ICD-10-CM | POA: Diagnosis not present

## 2022-08-26 DIAGNOSIS — Z992 Dependence on renal dialysis: Secondary | ICD-10-CM | POA: Diagnosis not present

## 2022-08-26 DIAGNOSIS — Z5181 Encounter for therapeutic drug level monitoring: Secondary | ICD-10-CM | POA: Diagnosis not present

## 2022-08-26 DIAGNOSIS — N2581 Secondary hyperparathyroidism of renal origin: Secondary | ICD-10-CM | POA: Diagnosis not present

## 2022-08-26 DIAGNOSIS — T7840XA Allergy, unspecified, initial encounter: Secondary | ICD-10-CM | POA: Diagnosis not present

## 2022-08-26 DIAGNOSIS — L299 Pruritus, unspecified: Secondary | ICD-10-CM | POA: Diagnosis not present

## 2022-08-28 DIAGNOSIS — L299 Pruritus, unspecified: Secondary | ICD-10-CM | POA: Diagnosis not present

## 2022-08-28 DIAGNOSIS — N2581 Secondary hyperparathyroidism of renal origin: Secondary | ICD-10-CM | POA: Diagnosis not present

## 2022-08-28 DIAGNOSIS — Z992 Dependence on renal dialysis: Secondary | ICD-10-CM | POA: Diagnosis not present

## 2022-08-28 DIAGNOSIS — D689 Coagulation defect, unspecified: Secondary | ICD-10-CM | POA: Diagnosis not present

## 2022-08-28 DIAGNOSIS — Z5181 Encounter for therapeutic drug level monitoring: Secondary | ICD-10-CM | POA: Diagnosis not present

## 2022-08-28 DIAGNOSIS — T7840XA Allergy, unspecified, initial encounter: Secondary | ICD-10-CM | POA: Diagnosis not present

## 2022-08-28 DIAGNOSIS — D631 Anemia in chronic kidney disease: Secondary | ICD-10-CM | POA: Diagnosis not present

## 2022-08-28 DIAGNOSIS — N186 End stage renal disease: Secondary | ICD-10-CM | POA: Diagnosis not present

## 2022-08-31 DIAGNOSIS — N2581 Secondary hyperparathyroidism of renal origin: Secondary | ICD-10-CM | POA: Diagnosis not present

## 2022-08-31 DIAGNOSIS — Z5181 Encounter for therapeutic drug level monitoring: Secondary | ICD-10-CM | POA: Diagnosis not present

## 2022-08-31 DIAGNOSIS — L299 Pruritus, unspecified: Secondary | ICD-10-CM | POA: Diagnosis not present

## 2022-08-31 DIAGNOSIS — D631 Anemia in chronic kidney disease: Secondary | ICD-10-CM | POA: Diagnosis not present

## 2022-08-31 DIAGNOSIS — N186 End stage renal disease: Secondary | ICD-10-CM | POA: Diagnosis not present

## 2022-08-31 DIAGNOSIS — D689 Coagulation defect, unspecified: Secondary | ICD-10-CM | POA: Diagnosis not present

## 2022-08-31 DIAGNOSIS — T7840XA Allergy, unspecified, initial encounter: Secondary | ICD-10-CM | POA: Diagnosis not present

## 2022-08-31 DIAGNOSIS — Z992 Dependence on renal dialysis: Secondary | ICD-10-CM | POA: Diagnosis not present

## 2022-09-01 DIAGNOSIS — I120 Hypertensive chronic kidney disease with stage 5 chronic kidney disease or end stage renal disease: Secondary | ICD-10-CM | POA: Diagnosis not present

## 2022-09-01 DIAGNOSIS — E785 Hyperlipidemia, unspecified: Secondary | ICD-10-CM | POA: Diagnosis not present

## 2022-09-01 DIAGNOSIS — Z992 Dependence on renal dialysis: Secondary | ICD-10-CM | POA: Diagnosis not present

## 2022-09-01 DIAGNOSIS — Z0000 Encounter for general adult medical examination without abnormal findings: Secondary | ICD-10-CM | POA: Diagnosis not present

## 2022-09-02 DIAGNOSIS — N186 End stage renal disease: Secondary | ICD-10-CM | POA: Diagnosis not present

## 2022-09-02 DIAGNOSIS — N2581 Secondary hyperparathyroidism of renal origin: Secondary | ICD-10-CM | POA: Diagnosis not present

## 2022-09-02 DIAGNOSIS — D689 Coagulation defect, unspecified: Secondary | ICD-10-CM | POA: Diagnosis not present

## 2022-09-02 DIAGNOSIS — L299 Pruritus, unspecified: Secondary | ICD-10-CM | POA: Diagnosis not present

## 2022-09-02 DIAGNOSIS — Z992 Dependence on renal dialysis: Secondary | ICD-10-CM | POA: Diagnosis not present

## 2022-09-02 DIAGNOSIS — T7840XA Allergy, unspecified, initial encounter: Secondary | ICD-10-CM | POA: Diagnosis not present

## 2022-09-02 DIAGNOSIS — D631 Anemia in chronic kidney disease: Secondary | ICD-10-CM | POA: Diagnosis not present

## 2022-09-02 DIAGNOSIS — Z5181 Encounter for therapeutic drug level monitoring: Secondary | ICD-10-CM | POA: Diagnosis not present

## 2022-09-04 DIAGNOSIS — L299 Pruritus, unspecified: Secondary | ICD-10-CM | POA: Diagnosis not present

## 2022-09-04 DIAGNOSIS — D689 Coagulation defect, unspecified: Secondary | ICD-10-CM | POA: Diagnosis not present

## 2022-09-04 DIAGNOSIS — N2581 Secondary hyperparathyroidism of renal origin: Secondary | ICD-10-CM | POA: Diagnosis not present

## 2022-09-04 DIAGNOSIS — Z992 Dependence on renal dialysis: Secondary | ICD-10-CM | POA: Diagnosis not present

## 2022-09-04 DIAGNOSIS — Z5181 Encounter for therapeutic drug level monitoring: Secondary | ICD-10-CM | POA: Diagnosis not present

## 2022-09-04 DIAGNOSIS — N186 End stage renal disease: Secondary | ICD-10-CM | POA: Diagnosis not present

## 2022-09-04 DIAGNOSIS — D631 Anemia in chronic kidney disease: Secondary | ICD-10-CM | POA: Diagnosis not present

## 2022-09-04 DIAGNOSIS — T7840XA Allergy, unspecified, initial encounter: Secondary | ICD-10-CM | POA: Diagnosis not present

## 2022-09-07 DIAGNOSIS — D689 Coagulation defect, unspecified: Secondary | ICD-10-CM | POA: Diagnosis not present

## 2022-09-07 DIAGNOSIS — L299 Pruritus, unspecified: Secondary | ICD-10-CM | POA: Diagnosis not present

## 2022-09-07 DIAGNOSIS — Z992 Dependence on renal dialysis: Secondary | ICD-10-CM | POA: Diagnosis not present

## 2022-09-07 DIAGNOSIS — D631 Anemia in chronic kidney disease: Secondary | ICD-10-CM | POA: Diagnosis not present

## 2022-09-07 DIAGNOSIS — N2581 Secondary hyperparathyroidism of renal origin: Secondary | ICD-10-CM | POA: Diagnosis not present

## 2022-09-07 DIAGNOSIS — T7840XA Allergy, unspecified, initial encounter: Secondary | ICD-10-CM | POA: Diagnosis not present

## 2022-09-07 DIAGNOSIS — Z5181 Encounter for therapeutic drug level monitoring: Secondary | ICD-10-CM | POA: Diagnosis not present

## 2022-09-07 DIAGNOSIS — N186 End stage renal disease: Secondary | ICD-10-CM | POA: Diagnosis not present

## 2022-09-09 DIAGNOSIS — D689 Coagulation defect, unspecified: Secondary | ICD-10-CM | POA: Diagnosis not present

## 2022-09-09 DIAGNOSIS — Z5181 Encounter for therapeutic drug level monitoring: Secondary | ICD-10-CM | POA: Diagnosis not present

## 2022-09-09 DIAGNOSIS — Z992 Dependence on renal dialysis: Secondary | ICD-10-CM | POA: Diagnosis not present

## 2022-09-09 DIAGNOSIS — T7840XA Allergy, unspecified, initial encounter: Secondary | ICD-10-CM | POA: Diagnosis not present

## 2022-09-09 DIAGNOSIS — L299 Pruritus, unspecified: Secondary | ICD-10-CM | POA: Diagnosis not present

## 2022-09-09 DIAGNOSIS — N186 End stage renal disease: Secondary | ICD-10-CM | POA: Diagnosis not present

## 2022-09-09 DIAGNOSIS — D631 Anemia in chronic kidney disease: Secondary | ICD-10-CM | POA: Diagnosis not present

## 2022-09-09 DIAGNOSIS — N2581 Secondary hyperparathyroidism of renal origin: Secondary | ICD-10-CM | POA: Diagnosis not present

## 2022-09-11 DIAGNOSIS — Z5181 Encounter for therapeutic drug level monitoring: Secondary | ICD-10-CM | POA: Diagnosis not present

## 2022-09-11 DIAGNOSIS — N186 End stage renal disease: Secondary | ICD-10-CM | POA: Diagnosis not present

## 2022-09-11 DIAGNOSIS — Z992 Dependence on renal dialysis: Secondary | ICD-10-CM | POA: Diagnosis not present

## 2022-09-11 DIAGNOSIS — L299 Pruritus, unspecified: Secondary | ICD-10-CM | POA: Diagnosis not present

## 2022-09-11 DIAGNOSIS — N2581 Secondary hyperparathyroidism of renal origin: Secondary | ICD-10-CM | POA: Diagnosis not present

## 2022-09-11 DIAGNOSIS — T7840XA Allergy, unspecified, initial encounter: Secondary | ICD-10-CM | POA: Diagnosis not present

## 2022-09-11 DIAGNOSIS — D689 Coagulation defect, unspecified: Secondary | ICD-10-CM | POA: Diagnosis not present

## 2022-09-11 DIAGNOSIS — D631 Anemia in chronic kidney disease: Secondary | ICD-10-CM | POA: Diagnosis not present

## 2022-09-12 DIAGNOSIS — N186 End stage renal disease: Secondary | ICD-10-CM | POA: Diagnosis not present

## 2022-09-12 DIAGNOSIS — T8610 Unspecified complication of kidney transplant: Secondary | ICD-10-CM | POA: Diagnosis not present

## 2022-09-12 DIAGNOSIS — Z992 Dependence on renal dialysis: Secondary | ICD-10-CM | POA: Diagnosis not present

## 2022-09-14 DIAGNOSIS — Z992 Dependence on renal dialysis: Secondary | ICD-10-CM | POA: Diagnosis not present

## 2022-09-14 DIAGNOSIS — Z23 Encounter for immunization: Secondary | ICD-10-CM | POA: Diagnosis not present

## 2022-09-14 DIAGNOSIS — N186 End stage renal disease: Secondary | ICD-10-CM | POA: Diagnosis not present

## 2022-09-14 DIAGNOSIS — D631 Anemia in chronic kidney disease: Secondary | ICD-10-CM | POA: Diagnosis not present

## 2022-09-14 DIAGNOSIS — N2581 Secondary hyperparathyroidism of renal origin: Secondary | ICD-10-CM | POA: Diagnosis not present

## 2022-09-14 DIAGNOSIS — Z5181 Encounter for therapeutic drug level monitoring: Secondary | ICD-10-CM | POA: Diagnosis not present

## 2022-09-14 DIAGNOSIS — L299 Pruritus, unspecified: Secondary | ICD-10-CM | POA: Diagnosis not present

## 2022-09-14 DIAGNOSIS — D689 Coagulation defect, unspecified: Secondary | ICD-10-CM | POA: Diagnosis not present

## 2022-09-16 DIAGNOSIS — Z992 Dependence on renal dialysis: Secondary | ICD-10-CM | POA: Diagnosis not present

## 2022-09-16 DIAGNOSIS — N2581 Secondary hyperparathyroidism of renal origin: Secondary | ICD-10-CM | POA: Diagnosis not present

## 2022-09-16 DIAGNOSIS — D689 Coagulation defect, unspecified: Secondary | ICD-10-CM | POA: Diagnosis not present

## 2022-09-16 DIAGNOSIS — L299 Pruritus, unspecified: Secondary | ICD-10-CM | POA: Diagnosis not present

## 2022-09-16 DIAGNOSIS — D631 Anemia in chronic kidney disease: Secondary | ICD-10-CM | POA: Diagnosis not present

## 2022-09-16 DIAGNOSIS — N186 End stage renal disease: Secondary | ICD-10-CM | POA: Diagnosis not present

## 2022-09-16 DIAGNOSIS — Z23 Encounter for immunization: Secondary | ICD-10-CM | POA: Diagnosis not present

## 2022-09-16 DIAGNOSIS — Z5181 Encounter for therapeutic drug level monitoring: Secondary | ICD-10-CM | POA: Diagnosis not present

## 2022-09-18 DIAGNOSIS — L299 Pruritus, unspecified: Secondary | ICD-10-CM | POA: Diagnosis not present

## 2022-09-18 DIAGNOSIS — Z992 Dependence on renal dialysis: Secondary | ICD-10-CM | POA: Diagnosis not present

## 2022-09-18 DIAGNOSIS — D689 Coagulation defect, unspecified: Secondary | ICD-10-CM | POA: Diagnosis not present

## 2022-09-18 DIAGNOSIS — Z23 Encounter for immunization: Secondary | ICD-10-CM | POA: Diagnosis not present

## 2022-09-18 DIAGNOSIS — N186 End stage renal disease: Secondary | ICD-10-CM | POA: Diagnosis not present

## 2022-09-18 DIAGNOSIS — D631 Anemia in chronic kidney disease: Secondary | ICD-10-CM | POA: Diagnosis not present

## 2022-09-18 DIAGNOSIS — Z5181 Encounter for therapeutic drug level monitoring: Secondary | ICD-10-CM | POA: Diagnosis not present

## 2022-09-18 DIAGNOSIS — N2581 Secondary hyperparathyroidism of renal origin: Secondary | ICD-10-CM | POA: Diagnosis not present

## 2022-09-21 DIAGNOSIS — M25511 Pain in right shoulder: Secondary | ICD-10-CM | POA: Diagnosis not present

## 2022-09-21 DIAGNOSIS — D689 Coagulation defect, unspecified: Secondary | ICD-10-CM | POA: Diagnosis not present

## 2022-09-21 DIAGNOSIS — Z23 Encounter for immunization: Secondary | ICD-10-CM | POA: Diagnosis not present

## 2022-09-21 DIAGNOSIS — L299 Pruritus, unspecified: Secondary | ICD-10-CM | POA: Diagnosis not present

## 2022-09-21 DIAGNOSIS — M9902 Segmental and somatic dysfunction of thoracic region: Secondary | ICD-10-CM | POA: Diagnosis not present

## 2022-09-21 DIAGNOSIS — Z5181 Encounter for therapeutic drug level monitoring: Secondary | ICD-10-CM | POA: Diagnosis not present

## 2022-09-21 DIAGNOSIS — M542 Cervicalgia: Secondary | ICD-10-CM | POA: Diagnosis not present

## 2022-09-21 DIAGNOSIS — Z992 Dependence on renal dialysis: Secondary | ICD-10-CM | POA: Diagnosis not present

## 2022-09-21 DIAGNOSIS — M9901 Segmental and somatic dysfunction of cervical region: Secondary | ICD-10-CM | POA: Diagnosis not present

## 2022-09-21 DIAGNOSIS — N186 End stage renal disease: Secondary | ICD-10-CM | POA: Diagnosis not present

## 2022-09-21 DIAGNOSIS — N2581 Secondary hyperparathyroidism of renal origin: Secondary | ICD-10-CM | POA: Diagnosis not present

## 2022-09-21 DIAGNOSIS — M9903 Segmental and somatic dysfunction of lumbar region: Secondary | ICD-10-CM | POA: Diagnosis not present

## 2022-09-21 DIAGNOSIS — M5412 Radiculopathy, cervical region: Secondary | ICD-10-CM | POA: Diagnosis not present

## 2022-09-21 DIAGNOSIS — D631 Anemia in chronic kidney disease: Secondary | ICD-10-CM | POA: Diagnosis not present

## 2022-09-23 DIAGNOSIS — Z23 Encounter for immunization: Secondary | ICD-10-CM | POA: Diagnosis not present

## 2022-09-23 DIAGNOSIS — D631 Anemia in chronic kidney disease: Secondary | ICD-10-CM | POA: Diagnosis not present

## 2022-09-23 DIAGNOSIS — L299 Pruritus, unspecified: Secondary | ICD-10-CM | POA: Diagnosis not present

## 2022-09-23 DIAGNOSIS — Z992 Dependence on renal dialysis: Secondary | ICD-10-CM | POA: Diagnosis not present

## 2022-09-23 DIAGNOSIS — D689 Coagulation defect, unspecified: Secondary | ICD-10-CM | POA: Diagnosis not present

## 2022-09-23 DIAGNOSIS — Z5181 Encounter for therapeutic drug level monitoring: Secondary | ICD-10-CM | POA: Diagnosis not present

## 2022-09-23 DIAGNOSIS — N186 End stage renal disease: Secondary | ICD-10-CM | POA: Diagnosis not present

## 2022-09-23 DIAGNOSIS — N2581 Secondary hyperparathyroidism of renal origin: Secondary | ICD-10-CM | POA: Diagnosis not present

## 2022-09-25 DIAGNOSIS — N2581 Secondary hyperparathyroidism of renal origin: Secondary | ICD-10-CM | POA: Diagnosis not present

## 2022-09-25 DIAGNOSIS — N186 End stage renal disease: Secondary | ICD-10-CM | POA: Diagnosis not present

## 2022-09-25 DIAGNOSIS — D689 Coagulation defect, unspecified: Secondary | ICD-10-CM | POA: Diagnosis not present

## 2022-09-25 DIAGNOSIS — D631 Anemia in chronic kidney disease: Secondary | ICD-10-CM | POA: Diagnosis not present

## 2022-09-25 DIAGNOSIS — Z992 Dependence on renal dialysis: Secondary | ICD-10-CM | POA: Diagnosis not present

## 2022-09-25 DIAGNOSIS — Z23 Encounter for immunization: Secondary | ICD-10-CM | POA: Diagnosis not present

## 2022-09-25 DIAGNOSIS — L299 Pruritus, unspecified: Secondary | ICD-10-CM | POA: Diagnosis not present

## 2022-09-25 DIAGNOSIS — Z5181 Encounter for therapeutic drug level monitoring: Secondary | ICD-10-CM | POA: Diagnosis not present

## 2022-09-28 DIAGNOSIS — D631 Anemia in chronic kidney disease: Secondary | ICD-10-CM | POA: Diagnosis not present

## 2022-09-28 DIAGNOSIS — L299 Pruritus, unspecified: Secondary | ICD-10-CM | POA: Diagnosis not present

## 2022-09-28 DIAGNOSIS — Z992 Dependence on renal dialysis: Secondary | ICD-10-CM | POA: Diagnosis not present

## 2022-09-28 DIAGNOSIS — D689 Coagulation defect, unspecified: Secondary | ICD-10-CM | POA: Diagnosis not present

## 2022-09-28 DIAGNOSIS — N186 End stage renal disease: Secondary | ICD-10-CM | POA: Diagnosis not present

## 2022-09-28 DIAGNOSIS — Z23 Encounter for immunization: Secondary | ICD-10-CM | POA: Diagnosis not present

## 2022-09-28 DIAGNOSIS — Z5181 Encounter for therapeutic drug level monitoring: Secondary | ICD-10-CM | POA: Diagnosis not present

## 2022-09-28 DIAGNOSIS — N2581 Secondary hyperparathyroidism of renal origin: Secondary | ICD-10-CM | POA: Diagnosis not present

## 2022-09-30 DIAGNOSIS — D689 Coagulation defect, unspecified: Secondary | ICD-10-CM | POA: Diagnosis not present

## 2022-09-30 DIAGNOSIS — N186 End stage renal disease: Secondary | ICD-10-CM | POA: Diagnosis not present

## 2022-09-30 DIAGNOSIS — Z992 Dependence on renal dialysis: Secondary | ICD-10-CM | POA: Diagnosis not present

## 2022-09-30 DIAGNOSIS — Z23 Encounter for immunization: Secondary | ICD-10-CM | POA: Diagnosis not present

## 2022-09-30 DIAGNOSIS — L299 Pruritus, unspecified: Secondary | ICD-10-CM | POA: Diagnosis not present

## 2022-09-30 DIAGNOSIS — D631 Anemia in chronic kidney disease: Secondary | ICD-10-CM | POA: Diagnosis not present

## 2022-09-30 DIAGNOSIS — N2581 Secondary hyperparathyroidism of renal origin: Secondary | ICD-10-CM | POA: Diagnosis not present

## 2022-09-30 DIAGNOSIS — Z5181 Encounter for therapeutic drug level monitoring: Secondary | ICD-10-CM | POA: Diagnosis not present

## 2022-10-01 DIAGNOSIS — N186 End stage renal disease: Secondary | ICD-10-CM | POA: Diagnosis not present

## 2022-10-01 DIAGNOSIS — T82858A Stenosis of vascular prosthetic devices, implants and grafts, initial encounter: Secondary | ICD-10-CM | POA: Diagnosis not present

## 2022-10-01 DIAGNOSIS — I771 Stricture of artery: Secondary | ICD-10-CM | POA: Diagnosis not present

## 2022-10-01 DIAGNOSIS — Z992 Dependence on renal dialysis: Secondary | ICD-10-CM | POA: Diagnosis not present

## 2022-10-02 DIAGNOSIS — D689 Coagulation defect, unspecified: Secondary | ICD-10-CM | POA: Diagnosis not present

## 2022-10-02 DIAGNOSIS — N186 End stage renal disease: Secondary | ICD-10-CM | POA: Diagnosis not present

## 2022-10-02 DIAGNOSIS — Z992 Dependence on renal dialysis: Secondary | ICD-10-CM | POA: Diagnosis not present

## 2022-10-02 DIAGNOSIS — N2581 Secondary hyperparathyroidism of renal origin: Secondary | ICD-10-CM | POA: Diagnosis not present

## 2022-10-02 DIAGNOSIS — L299 Pruritus, unspecified: Secondary | ICD-10-CM | POA: Diagnosis not present

## 2022-10-02 DIAGNOSIS — Z5181 Encounter for therapeutic drug level monitoring: Secondary | ICD-10-CM | POA: Diagnosis not present

## 2022-10-02 DIAGNOSIS — D631 Anemia in chronic kidney disease: Secondary | ICD-10-CM | POA: Diagnosis not present

## 2022-10-02 DIAGNOSIS — Z23 Encounter for immunization: Secondary | ICD-10-CM | POA: Diagnosis not present

## 2022-10-05 DIAGNOSIS — D631 Anemia in chronic kidney disease: Secondary | ICD-10-CM | POA: Diagnosis not present

## 2022-10-05 DIAGNOSIS — N2581 Secondary hyperparathyroidism of renal origin: Secondary | ICD-10-CM | POA: Diagnosis not present

## 2022-10-05 DIAGNOSIS — Z992 Dependence on renal dialysis: Secondary | ICD-10-CM | POA: Diagnosis not present

## 2022-10-05 DIAGNOSIS — D689 Coagulation defect, unspecified: Secondary | ICD-10-CM | POA: Diagnosis not present

## 2022-10-05 DIAGNOSIS — Z23 Encounter for immunization: Secondary | ICD-10-CM | POA: Diagnosis not present

## 2022-10-05 DIAGNOSIS — Z5181 Encounter for therapeutic drug level monitoring: Secondary | ICD-10-CM | POA: Diagnosis not present

## 2022-10-05 DIAGNOSIS — N186 End stage renal disease: Secondary | ICD-10-CM | POA: Diagnosis not present

## 2022-10-05 DIAGNOSIS — L299 Pruritus, unspecified: Secondary | ICD-10-CM | POA: Diagnosis not present

## 2022-10-06 DIAGNOSIS — N186 End stage renal disease: Secondary | ICD-10-CM | POA: Diagnosis not present

## 2022-10-06 DIAGNOSIS — L299 Pruritus, unspecified: Secondary | ICD-10-CM | POA: Diagnosis not present

## 2022-10-06 DIAGNOSIS — D631 Anemia in chronic kidney disease: Secondary | ICD-10-CM | POA: Diagnosis not present

## 2022-10-06 DIAGNOSIS — Z992 Dependence on renal dialysis: Secondary | ICD-10-CM | POA: Diagnosis not present

## 2022-10-06 DIAGNOSIS — N2581 Secondary hyperparathyroidism of renal origin: Secondary | ICD-10-CM | POA: Diagnosis not present

## 2022-10-06 DIAGNOSIS — Z23 Encounter for immunization: Secondary | ICD-10-CM | POA: Diagnosis not present

## 2022-10-06 DIAGNOSIS — D689 Coagulation defect, unspecified: Secondary | ICD-10-CM | POA: Diagnosis not present

## 2022-10-06 DIAGNOSIS — Z5181 Encounter for therapeutic drug level monitoring: Secondary | ICD-10-CM | POA: Diagnosis not present

## 2022-10-07 DIAGNOSIS — N2581 Secondary hyperparathyroidism of renal origin: Secondary | ICD-10-CM | POA: Diagnosis not present

## 2022-10-07 DIAGNOSIS — Z23 Encounter for immunization: Secondary | ICD-10-CM | POA: Diagnosis not present

## 2022-10-07 DIAGNOSIS — D631 Anemia in chronic kidney disease: Secondary | ICD-10-CM | POA: Diagnosis not present

## 2022-10-07 DIAGNOSIS — D689 Coagulation defect, unspecified: Secondary | ICD-10-CM | POA: Diagnosis not present

## 2022-10-07 DIAGNOSIS — N186 End stage renal disease: Secondary | ICD-10-CM | POA: Diagnosis not present

## 2022-10-07 DIAGNOSIS — Z992 Dependence on renal dialysis: Secondary | ICD-10-CM | POA: Diagnosis not present

## 2022-10-07 DIAGNOSIS — L299 Pruritus, unspecified: Secondary | ICD-10-CM | POA: Diagnosis not present

## 2022-10-07 DIAGNOSIS — Z5181 Encounter for therapeutic drug level monitoring: Secondary | ICD-10-CM | POA: Diagnosis not present

## 2022-10-09 DIAGNOSIS — L299 Pruritus, unspecified: Secondary | ICD-10-CM | POA: Diagnosis not present

## 2022-10-09 DIAGNOSIS — D631 Anemia in chronic kidney disease: Secondary | ICD-10-CM | POA: Diagnosis not present

## 2022-10-09 DIAGNOSIS — N186 End stage renal disease: Secondary | ICD-10-CM | POA: Diagnosis not present

## 2022-10-09 DIAGNOSIS — N2581 Secondary hyperparathyroidism of renal origin: Secondary | ICD-10-CM | POA: Diagnosis not present

## 2022-10-09 DIAGNOSIS — Z23 Encounter for immunization: Secondary | ICD-10-CM | POA: Diagnosis not present

## 2022-10-09 DIAGNOSIS — Z5181 Encounter for therapeutic drug level monitoring: Secondary | ICD-10-CM | POA: Diagnosis not present

## 2022-10-09 DIAGNOSIS — Z992 Dependence on renal dialysis: Secondary | ICD-10-CM | POA: Diagnosis not present

## 2022-10-09 DIAGNOSIS — D689 Coagulation defect, unspecified: Secondary | ICD-10-CM | POA: Diagnosis not present

## 2022-10-12 DIAGNOSIS — Z23 Encounter for immunization: Secondary | ICD-10-CM | POA: Diagnosis not present

## 2022-10-12 DIAGNOSIS — D689 Coagulation defect, unspecified: Secondary | ICD-10-CM | POA: Diagnosis not present

## 2022-10-12 DIAGNOSIS — N186 End stage renal disease: Secondary | ICD-10-CM | POA: Diagnosis not present

## 2022-10-12 DIAGNOSIS — N2581 Secondary hyperparathyroidism of renal origin: Secondary | ICD-10-CM | POA: Diagnosis not present

## 2022-10-12 DIAGNOSIS — L299 Pruritus, unspecified: Secondary | ICD-10-CM | POA: Diagnosis not present

## 2022-10-12 DIAGNOSIS — Z5181 Encounter for therapeutic drug level monitoring: Secondary | ICD-10-CM | POA: Diagnosis not present

## 2022-10-12 DIAGNOSIS — Z992 Dependence on renal dialysis: Secondary | ICD-10-CM | POA: Diagnosis not present

## 2022-10-12 DIAGNOSIS — D631 Anemia in chronic kidney disease: Secondary | ICD-10-CM | POA: Diagnosis not present

## 2022-10-13 DIAGNOSIS — N186 End stage renal disease: Secondary | ICD-10-CM | POA: Diagnosis not present

## 2022-10-13 DIAGNOSIS — T8610 Unspecified complication of kidney transplant: Secondary | ICD-10-CM | POA: Diagnosis not present

## 2022-10-13 DIAGNOSIS — Z992 Dependence on renal dialysis: Secondary | ICD-10-CM | POA: Diagnosis not present

## 2022-10-14 DIAGNOSIS — N2581 Secondary hyperparathyroidism of renal origin: Secondary | ICD-10-CM | POA: Diagnosis not present

## 2022-10-14 DIAGNOSIS — Z5181 Encounter for therapeutic drug level monitoring: Secondary | ICD-10-CM | POA: Diagnosis not present

## 2022-10-14 DIAGNOSIS — D689 Coagulation defect, unspecified: Secondary | ICD-10-CM | POA: Diagnosis not present

## 2022-10-14 DIAGNOSIS — Z992 Dependence on renal dialysis: Secondary | ICD-10-CM | POA: Diagnosis not present

## 2022-10-14 DIAGNOSIS — D631 Anemia in chronic kidney disease: Secondary | ICD-10-CM | POA: Diagnosis not present

## 2022-10-14 DIAGNOSIS — N186 End stage renal disease: Secondary | ICD-10-CM | POA: Diagnosis not present

## 2022-10-14 DIAGNOSIS — L299 Pruritus, unspecified: Secondary | ICD-10-CM | POA: Diagnosis not present

## 2022-10-15 DIAGNOSIS — T8612 Kidney transplant failure: Secondary | ICD-10-CM | POA: Diagnosis not present

## 2022-10-15 DIAGNOSIS — N186 End stage renal disease: Secondary | ICD-10-CM | POA: Diagnosis not present

## 2022-10-15 DIAGNOSIS — Z992 Dependence on renal dialysis: Secondary | ICD-10-CM | POA: Diagnosis not present

## 2022-10-16 DIAGNOSIS — N2581 Secondary hyperparathyroidism of renal origin: Secondary | ICD-10-CM | POA: Diagnosis not present

## 2022-10-16 DIAGNOSIS — D689 Coagulation defect, unspecified: Secondary | ICD-10-CM | POA: Diagnosis not present

## 2022-10-16 DIAGNOSIS — D631 Anemia in chronic kidney disease: Secondary | ICD-10-CM | POA: Diagnosis not present

## 2022-10-16 DIAGNOSIS — N186 End stage renal disease: Secondary | ICD-10-CM | POA: Diagnosis not present

## 2022-10-16 DIAGNOSIS — Z992 Dependence on renal dialysis: Secondary | ICD-10-CM | POA: Diagnosis not present

## 2022-10-16 DIAGNOSIS — L299 Pruritus, unspecified: Secondary | ICD-10-CM | POA: Diagnosis not present

## 2022-10-16 DIAGNOSIS — Z5181 Encounter for therapeutic drug level monitoring: Secondary | ICD-10-CM | POA: Diagnosis not present

## 2022-10-19 DIAGNOSIS — Z992 Dependence on renal dialysis: Secondary | ICD-10-CM | POA: Diagnosis not present

## 2022-10-19 DIAGNOSIS — D689 Coagulation defect, unspecified: Secondary | ICD-10-CM | POA: Diagnosis not present

## 2022-10-19 DIAGNOSIS — N186 End stage renal disease: Secondary | ICD-10-CM | POA: Diagnosis not present

## 2022-10-19 DIAGNOSIS — N2581 Secondary hyperparathyroidism of renal origin: Secondary | ICD-10-CM | POA: Diagnosis not present

## 2022-10-19 DIAGNOSIS — L299 Pruritus, unspecified: Secondary | ICD-10-CM | POA: Diagnosis not present

## 2022-10-19 DIAGNOSIS — D631 Anemia in chronic kidney disease: Secondary | ICD-10-CM | POA: Diagnosis not present

## 2022-10-19 DIAGNOSIS — Z5181 Encounter for therapeutic drug level monitoring: Secondary | ICD-10-CM | POA: Diagnosis not present

## 2022-10-21 DIAGNOSIS — N186 End stage renal disease: Secondary | ICD-10-CM | POA: Diagnosis not present

## 2022-10-21 DIAGNOSIS — D689 Coagulation defect, unspecified: Secondary | ICD-10-CM | POA: Diagnosis not present

## 2022-10-21 DIAGNOSIS — Z992 Dependence on renal dialysis: Secondary | ICD-10-CM | POA: Diagnosis not present

## 2022-10-21 DIAGNOSIS — D631 Anemia in chronic kidney disease: Secondary | ICD-10-CM | POA: Diagnosis not present

## 2022-10-21 DIAGNOSIS — Z5181 Encounter for therapeutic drug level monitoring: Secondary | ICD-10-CM | POA: Diagnosis not present

## 2022-10-21 DIAGNOSIS — L299 Pruritus, unspecified: Secondary | ICD-10-CM | POA: Diagnosis not present

## 2022-10-21 DIAGNOSIS — N2581 Secondary hyperparathyroidism of renal origin: Secondary | ICD-10-CM | POA: Diagnosis not present

## 2022-10-23 DIAGNOSIS — Z5181 Encounter for therapeutic drug level monitoring: Secondary | ICD-10-CM | POA: Diagnosis not present

## 2022-10-23 DIAGNOSIS — D689 Coagulation defect, unspecified: Secondary | ICD-10-CM | POA: Diagnosis not present

## 2022-10-23 DIAGNOSIS — D631 Anemia in chronic kidney disease: Secondary | ICD-10-CM | POA: Diagnosis not present

## 2022-10-23 DIAGNOSIS — N186 End stage renal disease: Secondary | ICD-10-CM | POA: Diagnosis not present

## 2022-10-23 DIAGNOSIS — N2581 Secondary hyperparathyroidism of renal origin: Secondary | ICD-10-CM | POA: Diagnosis not present

## 2022-10-23 DIAGNOSIS — L299 Pruritus, unspecified: Secondary | ICD-10-CM | POA: Diagnosis not present

## 2022-10-23 DIAGNOSIS — Z992 Dependence on renal dialysis: Secondary | ICD-10-CM | POA: Diagnosis not present

## 2022-10-26 DIAGNOSIS — D689 Coagulation defect, unspecified: Secondary | ICD-10-CM | POA: Diagnosis not present

## 2022-10-26 DIAGNOSIS — L299 Pruritus, unspecified: Secondary | ICD-10-CM | POA: Diagnosis not present

## 2022-10-26 DIAGNOSIS — Z992 Dependence on renal dialysis: Secondary | ICD-10-CM | POA: Diagnosis not present

## 2022-10-26 DIAGNOSIS — Z5181 Encounter for therapeutic drug level monitoring: Secondary | ICD-10-CM | POA: Diagnosis not present

## 2022-10-26 DIAGNOSIS — N186 End stage renal disease: Secondary | ICD-10-CM | POA: Diagnosis not present

## 2022-10-26 DIAGNOSIS — N2581 Secondary hyperparathyroidism of renal origin: Secondary | ICD-10-CM | POA: Diagnosis not present

## 2022-10-26 DIAGNOSIS — D631 Anemia in chronic kidney disease: Secondary | ICD-10-CM | POA: Diagnosis not present

## 2022-10-28 DIAGNOSIS — N2581 Secondary hyperparathyroidism of renal origin: Secondary | ICD-10-CM | POA: Diagnosis not present

## 2022-10-28 DIAGNOSIS — Z992 Dependence on renal dialysis: Secondary | ICD-10-CM | POA: Diagnosis not present

## 2022-10-28 DIAGNOSIS — N186 End stage renal disease: Secondary | ICD-10-CM | POA: Diagnosis not present

## 2022-10-28 DIAGNOSIS — Z5181 Encounter for therapeutic drug level monitoring: Secondary | ICD-10-CM | POA: Diagnosis not present

## 2022-10-28 DIAGNOSIS — D631 Anemia in chronic kidney disease: Secondary | ICD-10-CM | POA: Diagnosis not present

## 2022-10-28 DIAGNOSIS — L299 Pruritus, unspecified: Secondary | ICD-10-CM | POA: Diagnosis not present

## 2022-10-28 DIAGNOSIS — D689 Coagulation defect, unspecified: Secondary | ICD-10-CM | POA: Diagnosis not present

## 2022-10-30 DIAGNOSIS — D631 Anemia in chronic kidney disease: Secondary | ICD-10-CM | POA: Diagnosis not present

## 2022-10-30 DIAGNOSIS — L299 Pruritus, unspecified: Secondary | ICD-10-CM | POA: Diagnosis not present

## 2022-10-30 DIAGNOSIS — N2581 Secondary hyperparathyroidism of renal origin: Secondary | ICD-10-CM | POA: Diagnosis not present

## 2022-10-30 DIAGNOSIS — N186 End stage renal disease: Secondary | ICD-10-CM | POA: Diagnosis not present

## 2022-10-30 DIAGNOSIS — Z992 Dependence on renal dialysis: Secondary | ICD-10-CM | POA: Diagnosis not present

## 2022-10-30 DIAGNOSIS — D689 Coagulation defect, unspecified: Secondary | ICD-10-CM | POA: Diagnosis not present

## 2022-10-30 DIAGNOSIS — Z5181 Encounter for therapeutic drug level monitoring: Secondary | ICD-10-CM | POA: Diagnosis not present

## 2022-11-01 DIAGNOSIS — D689 Coagulation defect, unspecified: Secondary | ICD-10-CM | POA: Diagnosis not present

## 2022-11-01 DIAGNOSIS — L299 Pruritus, unspecified: Secondary | ICD-10-CM | POA: Diagnosis not present

## 2022-11-01 DIAGNOSIS — N2581 Secondary hyperparathyroidism of renal origin: Secondary | ICD-10-CM | POA: Diagnosis not present

## 2022-11-01 DIAGNOSIS — D631 Anemia in chronic kidney disease: Secondary | ICD-10-CM | POA: Diagnosis not present

## 2022-11-01 DIAGNOSIS — N186 End stage renal disease: Secondary | ICD-10-CM | POA: Diagnosis not present

## 2022-11-01 DIAGNOSIS — Z5181 Encounter for therapeutic drug level monitoring: Secondary | ICD-10-CM | POA: Diagnosis not present

## 2022-11-01 DIAGNOSIS — Z992 Dependence on renal dialysis: Secondary | ICD-10-CM | POA: Diagnosis not present

## 2022-11-03 DIAGNOSIS — D631 Anemia in chronic kidney disease: Secondary | ICD-10-CM | POA: Diagnosis not present

## 2022-11-03 DIAGNOSIS — N2581 Secondary hyperparathyroidism of renal origin: Secondary | ICD-10-CM | POA: Diagnosis not present

## 2022-11-03 DIAGNOSIS — Z5181 Encounter for therapeutic drug level monitoring: Secondary | ICD-10-CM | POA: Diagnosis not present

## 2022-11-03 DIAGNOSIS — L299 Pruritus, unspecified: Secondary | ICD-10-CM | POA: Diagnosis not present

## 2022-11-03 DIAGNOSIS — N186 End stage renal disease: Secondary | ICD-10-CM | POA: Diagnosis not present

## 2022-11-03 DIAGNOSIS — Z992 Dependence on renal dialysis: Secondary | ICD-10-CM | POA: Diagnosis not present

## 2022-11-03 DIAGNOSIS — D689 Coagulation defect, unspecified: Secondary | ICD-10-CM | POA: Diagnosis not present

## 2022-11-06 DIAGNOSIS — N186 End stage renal disease: Secondary | ICD-10-CM | POA: Diagnosis not present

## 2022-11-06 DIAGNOSIS — Z5181 Encounter for therapeutic drug level monitoring: Secondary | ICD-10-CM | POA: Diagnosis not present

## 2022-11-06 DIAGNOSIS — D689 Coagulation defect, unspecified: Secondary | ICD-10-CM | POA: Diagnosis not present

## 2022-11-06 DIAGNOSIS — N2581 Secondary hyperparathyroidism of renal origin: Secondary | ICD-10-CM | POA: Diagnosis not present

## 2022-11-06 DIAGNOSIS — L299 Pruritus, unspecified: Secondary | ICD-10-CM | POA: Diagnosis not present

## 2022-11-06 DIAGNOSIS — Z992 Dependence on renal dialysis: Secondary | ICD-10-CM | POA: Diagnosis not present

## 2022-11-06 DIAGNOSIS — D631 Anemia in chronic kidney disease: Secondary | ICD-10-CM | POA: Diagnosis not present

## 2022-11-09 DIAGNOSIS — L299 Pruritus, unspecified: Secondary | ICD-10-CM | POA: Diagnosis not present

## 2022-11-09 DIAGNOSIS — Z5181 Encounter for therapeutic drug level monitoring: Secondary | ICD-10-CM | POA: Diagnosis not present

## 2022-11-09 DIAGNOSIS — D631 Anemia in chronic kidney disease: Secondary | ICD-10-CM | POA: Diagnosis not present

## 2022-11-09 DIAGNOSIS — D689 Coagulation defect, unspecified: Secondary | ICD-10-CM | POA: Diagnosis not present

## 2022-11-09 DIAGNOSIS — N2581 Secondary hyperparathyroidism of renal origin: Secondary | ICD-10-CM | POA: Diagnosis not present

## 2022-11-09 DIAGNOSIS — N186 End stage renal disease: Secondary | ICD-10-CM | POA: Diagnosis not present

## 2022-11-09 DIAGNOSIS — Z992 Dependence on renal dialysis: Secondary | ICD-10-CM | POA: Diagnosis not present

## 2022-11-11 DIAGNOSIS — Z992 Dependence on renal dialysis: Secondary | ICD-10-CM | POA: Diagnosis not present

## 2022-11-11 DIAGNOSIS — D631 Anemia in chronic kidney disease: Secondary | ICD-10-CM | POA: Diagnosis not present

## 2022-11-11 DIAGNOSIS — N2581 Secondary hyperparathyroidism of renal origin: Secondary | ICD-10-CM | POA: Diagnosis not present

## 2022-11-11 DIAGNOSIS — Z5181 Encounter for therapeutic drug level monitoring: Secondary | ICD-10-CM | POA: Diagnosis not present

## 2022-11-11 DIAGNOSIS — N186 End stage renal disease: Secondary | ICD-10-CM | POA: Diagnosis not present

## 2022-11-11 DIAGNOSIS — D689 Coagulation defect, unspecified: Secondary | ICD-10-CM | POA: Diagnosis not present

## 2022-11-11 DIAGNOSIS — L299 Pruritus, unspecified: Secondary | ICD-10-CM | POA: Diagnosis not present

## 2022-11-12 DIAGNOSIS — T8610 Unspecified complication of kidney transplant: Secondary | ICD-10-CM | POA: Diagnosis not present

## 2022-11-12 DIAGNOSIS — N186 End stage renal disease: Secondary | ICD-10-CM | POA: Diagnosis not present

## 2022-11-12 DIAGNOSIS — Z992 Dependence on renal dialysis: Secondary | ICD-10-CM | POA: Diagnosis not present

## 2022-11-13 DIAGNOSIS — N186 End stage renal disease: Secondary | ICD-10-CM | POA: Diagnosis not present

## 2022-11-13 DIAGNOSIS — L299 Pruritus, unspecified: Secondary | ICD-10-CM | POA: Diagnosis not present

## 2022-11-13 DIAGNOSIS — R197 Diarrhea, unspecified: Secondary | ICD-10-CM | POA: Diagnosis not present

## 2022-11-13 DIAGNOSIS — Z5181 Encounter for therapeutic drug level monitoring: Secondary | ICD-10-CM | POA: Diagnosis not present

## 2022-11-13 DIAGNOSIS — D689 Coagulation defect, unspecified: Secondary | ICD-10-CM | POA: Diagnosis not present

## 2022-11-13 DIAGNOSIS — Z992 Dependence on renal dialysis: Secondary | ICD-10-CM | POA: Diagnosis not present

## 2022-11-13 DIAGNOSIS — D631 Anemia in chronic kidney disease: Secondary | ICD-10-CM | POA: Diagnosis not present

## 2022-11-13 DIAGNOSIS — N2581 Secondary hyperparathyroidism of renal origin: Secondary | ICD-10-CM | POA: Diagnosis not present

## 2022-11-16 DIAGNOSIS — D631 Anemia in chronic kidney disease: Secondary | ICD-10-CM | POA: Diagnosis not present

## 2022-11-16 DIAGNOSIS — R197 Diarrhea, unspecified: Secondary | ICD-10-CM | POA: Diagnosis not present

## 2022-11-16 DIAGNOSIS — N2581 Secondary hyperparathyroidism of renal origin: Secondary | ICD-10-CM | POA: Diagnosis not present

## 2022-11-16 DIAGNOSIS — D689 Coagulation defect, unspecified: Secondary | ICD-10-CM | POA: Diagnosis not present

## 2022-11-16 DIAGNOSIS — Z992 Dependence on renal dialysis: Secondary | ICD-10-CM | POA: Diagnosis not present

## 2022-11-16 DIAGNOSIS — N186 End stage renal disease: Secondary | ICD-10-CM | POA: Diagnosis not present

## 2022-11-16 DIAGNOSIS — L299 Pruritus, unspecified: Secondary | ICD-10-CM | POA: Diagnosis not present

## 2022-11-16 DIAGNOSIS — Z5181 Encounter for therapeutic drug level monitoring: Secondary | ICD-10-CM | POA: Diagnosis not present

## 2022-11-18 DIAGNOSIS — Z992 Dependence on renal dialysis: Secondary | ICD-10-CM | POA: Diagnosis not present

## 2022-11-18 DIAGNOSIS — D631 Anemia in chronic kidney disease: Secondary | ICD-10-CM | POA: Diagnosis not present

## 2022-11-18 DIAGNOSIS — N2581 Secondary hyperparathyroidism of renal origin: Secondary | ICD-10-CM | POA: Diagnosis not present

## 2022-11-18 DIAGNOSIS — N186 End stage renal disease: Secondary | ICD-10-CM | POA: Diagnosis not present

## 2022-11-18 DIAGNOSIS — R197 Diarrhea, unspecified: Secondary | ICD-10-CM | POA: Diagnosis not present

## 2022-11-18 DIAGNOSIS — D689 Coagulation defect, unspecified: Secondary | ICD-10-CM | POA: Diagnosis not present

## 2022-11-18 DIAGNOSIS — L299 Pruritus, unspecified: Secondary | ICD-10-CM | POA: Diagnosis not present

## 2022-11-18 DIAGNOSIS — Z5181 Encounter for therapeutic drug level monitoring: Secondary | ICD-10-CM | POA: Diagnosis not present

## 2022-11-20 DIAGNOSIS — R197 Diarrhea, unspecified: Secondary | ICD-10-CM | POA: Diagnosis not present

## 2022-11-20 DIAGNOSIS — N2581 Secondary hyperparathyroidism of renal origin: Secondary | ICD-10-CM | POA: Diagnosis not present

## 2022-11-20 DIAGNOSIS — Z992 Dependence on renal dialysis: Secondary | ICD-10-CM | POA: Diagnosis not present

## 2022-11-20 DIAGNOSIS — D689 Coagulation defect, unspecified: Secondary | ICD-10-CM | POA: Diagnosis not present

## 2022-11-20 DIAGNOSIS — D631 Anemia in chronic kidney disease: Secondary | ICD-10-CM | POA: Diagnosis not present

## 2022-11-20 DIAGNOSIS — Z5181 Encounter for therapeutic drug level monitoring: Secondary | ICD-10-CM | POA: Diagnosis not present

## 2022-11-20 DIAGNOSIS — N186 End stage renal disease: Secondary | ICD-10-CM | POA: Diagnosis not present

## 2022-11-20 DIAGNOSIS — L299 Pruritus, unspecified: Secondary | ICD-10-CM | POA: Diagnosis not present

## 2022-11-23 DIAGNOSIS — R197 Diarrhea, unspecified: Secondary | ICD-10-CM | POA: Diagnosis not present

## 2022-11-23 DIAGNOSIS — D631 Anemia in chronic kidney disease: Secondary | ICD-10-CM | POA: Diagnosis not present

## 2022-11-23 DIAGNOSIS — N186 End stage renal disease: Secondary | ICD-10-CM | POA: Diagnosis not present

## 2022-11-23 DIAGNOSIS — L299 Pruritus, unspecified: Secondary | ICD-10-CM | POA: Diagnosis not present

## 2022-11-23 DIAGNOSIS — Z5181 Encounter for therapeutic drug level monitoring: Secondary | ICD-10-CM | POA: Diagnosis not present

## 2022-11-23 DIAGNOSIS — N2581 Secondary hyperparathyroidism of renal origin: Secondary | ICD-10-CM | POA: Diagnosis not present

## 2022-11-23 DIAGNOSIS — D689 Coagulation defect, unspecified: Secondary | ICD-10-CM | POA: Diagnosis not present

## 2022-11-23 DIAGNOSIS — Z992 Dependence on renal dialysis: Secondary | ICD-10-CM | POA: Diagnosis not present

## 2022-11-25 DIAGNOSIS — R197 Diarrhea, unspecified: Secondary | ICD-10-CM | POA: Diagnosis not present

## 2022-11-25 DIAGNOSIS — N186 End stage renal disease: Secondary | ICD-10-CM | POA: Diagnosis not present

## 2022-11-25 DIAGNOSIS — N2581 Secondary hyperparathyroidism of renal origin: Secondary | ICD-10-CM | POA: Diagnosis not present

## 2022-11-25 DIAGNOSIS — D631 Anemia in chronic kidney disease: Secondary | ICD-10-CM | POA: Diagnosis not present

## 2022-11-25 DIAGNOSIS — D689 Coagulation defect, unspecified: Secondary | ICD-10-CM | POA: Diagnosis not present

## 2022-11-25 DIAGNOSIS — Z5181 Encounter for therapeutic drug level monitoring: Secondary | ICD-10-CM | POA: Diagnosis not present

## 2022-11-25 DIAGNOSIS — L299 Pruritus, unspecified: Secondary | ICD-10-CM | POA: Diagnosis not present

## 2022-11-25 DIAGNOSIS — Z992 Dependence on renal dialysis: Secondary | ICD-10-CM | POA: Diagnosis not present

## 2022-11-27 DIAGNOSIS — N2581 Secondary hyperparathyroidism of renal origin: Secondary | ICD-10-CM | POA: Diagnosis not present

## 2022-11-27 DIAGNOSIS — N186 End stage renal disease: Secondary | ICD-10-CM | POA: Diagnosis not present

## 2022-11-27 DIAGNOSIS — D631 Anemia in chronic kidney disease: Secondary | ICD-10-CM | POA: Diagnosis not present

## 2022-11-27 DIAGNOSIS — D689 Coagulation defect, unspecified: Secondary | ICD-10-CM | POA: Diagnosis not present

## 2022-11-27 DIAGNOSIS — L299 Pruritus, unspecified: Secondary | ICD-10-CM | POA: Diagnosis not present

## 2022-11-27 DIAGNOSIS — Z5181 Encounter for therapeutic drug level monitoring: Secondary | ICD-10-CM | POA: Diagnosis not present

## 2022-11-27 DIAGNOSIS — R197 Diarrhea, unspecified: Secondary | ICD-10-CM | POA: Diagnosis not present

## 2022-11-27 DIAGNOSIS — Z992 Dependence on renal dialysis: Secondary | ICD-10-CM | POA: Diagnosis not present

## 2022-11-30 DIAGNOSIS — L299 Pruritus, unspecified: Secondary | ICD-10-CM | POA: Diagnosis not present

## 2022-11-30 DIAGNOSIS — N2581 Secondary hyperparathyroidism of renal origin: Secondary | ICD-10-CM | POA: Diagnosis not present

## 2022-11-30 DIAGNOSIS — Z5181 Encounter for therapeutic drug level monitoring: Secondary | ICD-10-CM | POA: Diagnosis not present

## 2022-11-30 DIAGNOSIS — Z992 Dependence on renal dialysis: Secondary | ICD-10-CM | POA: Diagnosis not present

## 2022-11-30 DIAGNOSIS — R197 Diarrhea, unspecified: Secondary | ICD-10-CM | POA: Diagnosis not present

## 2022-11-30 DIAGNOSIS — D689 Coagulation defect, unspecified: Secondary | ICD-10-CM | POA: Diagnosis not present

## 2022-11-30 DIAGNOSIS — D631 Anemia in chronic kidney disease: Secondary | ICD-10-CM | POA: Diagnosis not present

## 2022-11-30 DIAGNOSIS — N186 End stage renal disease: Secondary | ICD-10-CM | POA: Diagnosis not present

## 2022-12-01 DIAGNOSIS — D649 Anemia, unspecified: Secondary | ICD-10-CM | POA: Diagnosis not present

## 2022-12-01 DIAGNOSIS — N186 End stage renal disease: Secondary | ICD-10-CM | POA: Diagnosis not present

## 2022-12-01 DIAGNOSIS — I120 Hypertensive chronic kidney disease with stage 5 chronic kidney disease or end stage renal disease: Secondary | ICD-10-CM | POA: Diagnosis not present

## 2022-12-02 DIAGNOSIS — N2581 Secondary hyperparathyroidism of renal origin: Secondary | ICD-10-CM | POA: Diagnosis not present

## 2022-12-02 DIAGNOSIS — Z5181 Encounter for therapeutic drug level monitoring: Secondary | ICD-10-CM | POA: Diagnosis not present

## 2022-12-02 DIAGNOSIS — Z992 Dependence on renal dialysis: Secondary | ICD-10-CM | POA: Diagnosis not present

## 2022-12-02 DIAGNOSIS — R197 Diarrhea, unspecified: Secondary | ICD-10-CM | POA: Diagnosis not present

## 2022-12-02 DIAGNOSIS — L299 Pruritus, unspecified: Secondary | ICD-10-CM | POA: Diagnosis not present

## 2022-12-02 DIAGNOSIS — N186 End stage renal disease: Secondary | ICD-10-CM | POA: Diagnosis not present

## 2022-12-02 DIAGNOSIS — D631 Anemia in chronic kidney disease: Secondary | ICD-10-CM | POA: Diagnosis not present

## 2022-12-02 DIAGNOSIS — D689 Coagulation defect, unspecified: Secondary | ICD-10-CM | POA: Diagnosis not present

## 2022-12-04 DIAGNOSIS — R197 Diarrhea, unspecified: Secondary | ICD-10-CM | POA: Diagnosis not present

## 2022-12-04 DIAGNOSIS — L299 Pruritus, unspecified: Secondary | ICD-10-CM | POA: Diagnosis not present

## 2022-12-04 DIAGNOSIS — Z5181 Encounter for therapeutic drug level monitoring: Secondary | ICD-10-CM | POA: Diagnosis not present

## 2022-12-04 DIAGNOSIS — N2581 Secondary hyperparathyroidism of renal origin: Secondary | ICD-10-CM | POA: Diagnosis not present

## 2022-12-04 DIAGNOSIS — D631 Anemia in chronic kidney disease: Secondary | ICD-10-CM | POA: Diagnosis not present

## 2022-12-04 DIAGNOSIS — D689 Coagulation defect, unspecified: Secondary | ICD-10-CM | POA: Diagnosis not present

## 2022-12-04 DIAGNOSIS — Z992 Dependence on renal dialysis: Secondary | ICD-10-CM | POA: Diagnosis not present

## 2022-12-04 DIAGNOSIS — N186 End stage renal disease: Secondary | ICD-10-CM | POA: Diagnosis not present

## 2022-12-06 DIAGNOSIS — Z992 Dependence on renal dialysis: Secondary | ICD-10-CM | POA: Diagnosis not present

## 2022-12-06 DIAGNOSIS — N186 End stage renal disease: Secondary | ICD-10-CM | POA: Diagnosis not present

## 2022-12-06 DIAGNOSIS — L299 Pruritus, unspecified: Secondary | ICD-10-CM | POA: Diagnosis not present

## 2022-12-06 DIAGNOSIS — N2581 Secondary hyperparathyroidism of renal origin: Secondary | ICD-10-CM | POA: Diagnosis not present

## 2022-12-06 DIAGNOSIS — R197 Diarrhea, unspecified: Secondary | ICD-10-CM | POA: Diagnosis not present

## 2022-12-06 DIAGNOSIS — Z5181 Encounter for therapeutic drug level monitoring: Secondary | ICD-10-CM | POA: Diagnosis not present

## 2022-12-06 DIAGNOSIS — D631 Anemia in chronic kidney disease: Secondary | ICD-10-CM | POA: Diagnosis not present

## 2022-12-06 DIAGNOSIS — D689 Coagulation defect, unspecified: Secondary | ICD-10-CM | POA: Diagnosis not present

## 2022-12-09 DIAGNOSIS — L299 Pruritus, unspecified: Secondary | ICD-10-CM | POA: Diagnosis not present

## 2022-12-09 DIAGNOSIS — D689 Coagulation defect, unspecified: Secondary | ICD-10-CM | POA: Diagnosis not present

## 2022-12-09 DIAGNOSIS — Z5181 Encounter for therapeutic drug level monitoring: Secondary | ICD-10-CM | POA: Diagnosis not present

## 2022-12-09 DIAGNOSIS — R197 Diarrhea, unspecified: Secondary | ICD-10-CM | POA: Diagnosis not present

## 2022-12-09 DIAGNOSIS — N186 End stage renal disease: Secondary | ICD-10-CM | POA: Diagnosis not present

## 2022-12-09 DIAGNOSIS — D631 Anemia in chronic kidney disease: Secondary | ICD-10-CM | POA: Diagnosis not present

## 2022-12-09 DIAGNOSIS — Z992 Dependence on renal dialysis: Secondary | ICD-10-CM | POA: Diagnosis not present

## 2022-12-09 DIAGNOSIS — N2581 Secondary hyperparathyroidism of renal origin: Secondary | ICD-10-CM | POA: Diagnosis not present

## 2022-12-11 DIAGNOSIS — L299 Pruritus, unspecified: Secondary | ICD-10-CM | POA: Diagnosis not present

## 2022-12-11 DIAGNOSIS — N186 End stage renal disease: Secondary | ICD-10-CM | POA: Diagnosis not present

## 2022-12-11 DIAGNOSIS — Z992 Dependence on renal dialysis: Secondary | ICD-10-CM | POA: Diagnosis not present

## 2022-12-11 DIAGNOSIS — N2581 Secondary hyperparathyroidism of renal origin: Secondary | ICD-10-CM | POA: Diagnosis not present

## 2022-12-11 DIAGNOSIS — R197 Diarrhea, unspecified: Secondary | ICD-10-CM | POA: Diagnosis not present

## 2022-12-11 DIAGNOSIS — D631 Anemia in chronic kidney disease: Secondary | ICD-10-CM | POA: Diagnosis not present

## 2022-12-11 DIAGNOSIS — Z5181 Encounter for therapeutic drug level monitoring: Secondary | ICD-10-CM | POA: Diagnosis not present

## 2022-12-11 DIAGNOSIS — D689 Coagulation defect, unspecified: Secondary | ICD-10-CM | POA: Diagnosis not present

## 2022-12-13 DIAGNOSIS — T8610 Unspecified complication of kidney transplant: Secondary | ICD-10-CM | POA: Diagnosis not present

## 2022-12-13 DIAGNOSIS — N186 End stage renal disease: Secondary | ICD-10-CM | POA: Diagnosis not present

## 2022-12-13 DIAGNOSIS — R197 Diarrhea, unspecified: Secondary | ICD-10-CM | POA: Diagnosis not present

## 2022-12-13 DIAGNOSIS — Z992 Dependence on renal dialysis: Secondary | ICD-10-CM | POA: Diagnosis not present

## 2022-12-13 DIAGNOSIS — D689 Coagulation defect, unspecified: Secondary | ICD-10-CM | POA: Diagnosis not present

## 2022-12-13 DIAGNOSIS — Z5181 Encounter for therapeutic drug level monitoring: Secondary | ICD-10-CM | POA: Diagnosis not present

## 2022-12-13 DIAGNOSIS — N2581 Secondary hyperparathyroidism of renal origin: Secondary | ICD-10-CM | POA: Diagnosis not present

## 2022-12-13 DIAGNOSIS — L299 Pruritus, unspecified: Secondary | ICD-10-CM | POA: Diagnosis not present

## 2022-12-13 DIAGNOSIS — D631 Anemia in chronic kidney disease: Secondary | ICD-10-CM | POA: Diagnosis not present

## 2022-12-16 DIAGNOSIS — L299 Pruritus, unspecified: Secondary | ICD-10-CM | POA: Diagnosis not present

## 2022-12-16 DIAGNOSIS — D689 Coagulation defect, unspecified: Secondary | ICD-10-CM | POA: Diagnosis not present

## 2022-12-16 DIAGNOSIS — N2581 Secondary hyperparathyroidism of renal origin: Secondary | ICD-10-CM | POA: Diagnosis not present

## 2022-12-16 DIAGNOSIS — D631 Anemia in chronic kidney disease: Secondary | ICD-10-CM | POA: Diagnosis not present

## 2022-12-16 DIAGNOSIS — N186 End stage renal disease: Secondary | ICD-10-CM | POA: Diagnosis not present

## 2022-12-16 DIAGNOSIS — Z5181 Encounter for therapeutic drug level monitoring: Secondary | ICD-10-CM | POA: Diagnosis not present

## 2022-12-16 DIAGNOSIS — Z992 Dependence on renal dialysis: Secondary | ICD-10-CM | POA: Diagnosis not present

## 2022-12-18 DIAGNOSIS — N2581 Secondary hyperparathyroidism of renal origin: Secondary | ICD-10-CM | POA: Diagnosis not present

## 2022-12-18 DIAGNOSIS — Z992 Dependence on renal dialysis: Secondary | ICD-10-CM | POA: Diagnosis not present

## 2022-12-18 DIAGNOSIS — D631 Anemia in chronic kidney disease: Secondary | ICD-10-CM | POA: Diagnosis not present

## 2022-12-18 DIAGNOSIS — L299 Pruritus, unspecified: Secondary | ICD-10-CM | POA: Diagnosis not present

## 2022-12-18 DIAGNOSIS — D689 Coagulation defect, unspecified: Secondary | ICD-10-CM | POA: Diagnosis not present

## 2022-12-18 DIAGNOSIS — N186 End stage renal disease: Secondary | ICD-10-CM | POA: Diagnosis not present

## 2022-12-18 DIAGNOSIS — Z5181 Encounter for therapeutic drug level monitoring: Secondary | ICD-10-CM | POA: Diagnosis not present

## 2022-12-21 DIAGNOSIS — Z992 Dependence on renal dialysis: Secondary | ICD-10-CM | POA: Diagnosis not present

## 2022-12-21 DIAGNOSIS — D689 Coagulation defect, unspecified: Secondary | ICD-10-CM | POA: Diagnosis not present

## 2022-12-21 DIAGNOSIS — L299 Pruritus, unspecified: Secondary | ICD-10-CM | POA: Diagnosis not present

## 2022-12-21 DIAGNOSIS — D631 Anemia in chronic kidney disease: Secondary | ICD-10-CM | POA: Diagnosis not present

## 2022-12-21 DIAGNOSIS — N186 End stage renal disease: Secondary | ICD-10-CM | POA: Diagnosis not present

## 2022-12-21 DIAGNOSIS — N2581 Secondary hyperparathyroidism of renal origin: Secondary | ICD-10-CM | POA: Diagnosis not present

## 2022-12-21 DIAGNOSIS — Z5181 Encounter for therapeutic drug level monitoring: Secondary | ICD-10-CM | POA: Diagnosis not present

## 2022-12-23 DIAGNOSIS — D631 Anemia in chronic kidney disease: Secondary | ICD-10-CM | POA: Diagnosis not present

## 2022-12-23 DIAGNOSIS — Z992 Dependence on renal dialysis: Secondary | ICD-10-CM | POA: Diagnosis not present

## 2022-12-23 DIAGNOSIS — L299 Pruritus, unspecified: Secondary | ICD-10-CM | POA: Diagnosis not present

## 2022-12-23 DIAGNOSIS — N186 End stage renal disease: Secondary | ICD-10-CM | POA: Diagnosis not present

## 2022-12-23 DIAGNOSIS — D689 Coagulation defect, unspecified: Secondary | ICD-10-CM | POA: Diagnosis not present

## 2022-12-23 DIAGNOSIS — N2581 Secondary hyperparathyroidism of renal origin: Secondary | ICD-10-CM | POA: Diagnosis not present

## 2022-12-23 DIAGNOSIS — Z5181 Encounter for therapeutic drug level monitoring: Secondary | ICD-10-CM | POA: Diagnosis not present

## 2022-12-25 DIAGNOSIS — N2581 Secondary hyperparathyroidism of renal origin: Secondary | ICD-10-CM | POA: Diagnosis not present

## 2022-12-25 DIAGNOSIS — Z992 Dependence on renal dialysis: Secondary | ICD-10-CM | POA: Diagnosis not present

## 2022-12-25 DIAGNOSIS — L299 Pruritus, unspecified: Secondary | ICD-10-CM | POA: Diagnosis not present

## 2022-12-25 DIAGNOSIS — D689 Coagulation defect, unspecified: Secondary | ICD-10-CM | POA: Diagnosis not present

## 2022-12-25 DIAGNOSIS — N186 End stage renal disease: Secondary | ICD-10-CM | POA: Diagnosis not present

## 2022-12-25 DIAGNOSIS — D631 Anemia in chronic kidney disease: Secondary | ICD-10-CM | POA: Diagnosis not present

## 2022-12-25 DIAGNOSIS — Z5181 Encounter for therapeutic drug level monitoring: Secondary | ICD-10-CM | POA: Diagnosis not present

## 2022-12-28 DIAGNOSIS — L299 Pruritus, unspecified: Secondary | ICD-10-CM | POA: Diagnosis not present

## 2022-12-28 DIAGNOSIS — N186 End stage renal disease: Secondary | ICD-10-CM | POA: Diagnosis not present

## 2022-12-28 DIAGNOSIS — D631 Anemia in chronic kidney disease: Secondary | ICD-10-CM | POA: Diagnosis not present

## 2022-12-28 DIAGNOSIS — D689 Coagulation defect, unspecified: Secondary | ICD-10-CM | POA: Diagnosis not present

## 2022-12-28 DIAGNOSIS — Z992 Dependence on renal dialysis: Secondary | ICD-10-CM | POA: Diagnosis not present

## 2022-12-28 DIAGNOSIS — Z5181 Encounter for therapeutic drug level monitoring: Secondary | ICD-10-CM | POA: Diagnosis not present

## 2022-12-28 DIAGNOSIS — N2581 Secondary hyperparathyroidism of renal origin: Secondary | ICD-10-CM | POA: Diagnosis not present

## 2022-12-30 DIAGNOSIS — Z5181 Encounter for therapeutic drug level monitoring: Secondary | ICD-10-CM | POA: Diagnosis not present

## 2022-12-30 DIAGNOSIS — N2581 Secondary hyperparathyroidism of renal origin: Secondary | ICD-10-CM | POA: Diagnosis not present

## 2022-12-30 DIAGNOSIS — D689 Coagulation defect, unspecified: Secondary | ICD-10-CM | POA: Diagnosis not present

## 2022-12-30 DIAGNOSIS — D631 Anemia in chronic kidney disease: Secondary | ICD-10-CM | POA: Diagnosis not present

## 2022-12-30 DIAGNOSIS — Z992 Dependence on renal dialysis: Secondary | ICD-10-CM | POA: Diagnosis not present

## 2022-12-30 DIAGNOSIS — N186 End stage renal disease: Secondary | ICD-10-CM | POA: Diagnosis not present

## 2022-12-30 DIAGNOSIS — L299 Pruritus, unspecified: Secondary | ICD-10-CM | POA: Diagnosis not present

## 2023-01-01 DIAGNOSIS — L299 Pruritus, unspecified: Secondary | ICD-10-CM | POA: Diagnosis not present

## 2023-01-01 DIAGNOSIS — N2581 Secondary hyperparathyroidism of renal origin: Secondary | ICD-10-CM | POA: Diagnosis not present

## 2023-01-01 DIAGNOSIS — Z5181 Encounter for therapeutic drug level monitoring: Secondary | ICD-10-CM | POA: Diagnosis not present

## 2023-01-01 DIAGNOSIS — Z992 Dependence on renal dialysis: Secondary | ICD-10-CM | POA: Diagnosis not present

## 2023-01-01 DIAGNOSIS — N186 End stage renal disease: Secondary | ICD-10-CM | POA: Diagnosis not present

## 2023-01-01 DIAGNOSIS — D631 Anemia in chronic kidney disease: Secondary | ICD-10-CM | POA: Diagnosis not present

## 2023-01-01 DIAGNOSIS — D689 Coagulation defect, unspecified: Secondary | ICD-10-CM | POA: Diagnosis not present

## 2023-01-04 DIAGNOSIS — D631 Anemia in chronic kidney disease: Secondary | ICD-10-CM | POA: Diagnosis not present

## 2023-01-04 DIAGNOSIS — L299 Pruritus, unspecified: Secondary | ICD-10-CM | POA: Diagnosis not present

## 2023-01-04 DIAGNOSIS — N2581 Secondary hyperparathyroidism of renal origin: Secondary | ICD-10-CM | POA: Diagnosis not present

## 2023-01-04 DIAGNOSIS — D689 Coagulation defect, unspecified: Secondary | ICD-10-CM | POA: Diagnosis not present

## 2023-01-04 DIAGNOSIS — N186 End stage renal disease: Secondary | ICD-10-CM | POA: Diagnosis not present

## 2023-01-04 DIAGNOSIS — Z992 Dependence on renal dialysis: Secondary | ICD-10-CM | POA: Diagnosis not present

## 2023-01-04 DIAGNOSIS — Z5181 Encounter for therapeutic drug level monitoring: Secondary | ICD-10-CM | POA: Diagnosis not present

## 2023-01-06 DIAGNOSIS — Z992 Dependence on renal dialysis: Secondary | ICD-10-CM | POA: Diagnosis not present

## 2023-01-06 DIAGNOSIS — Z5181 Encounter for therapeutic drug level monitoring: Secondary | ICD-10-CM | POA: Diagnosis not present

## 2023-01-06 DIAGNOSIS — L299 Pruritus, unspecified: Secondary | ICD-10-CM | POA: Diagnosis not present

## 2023-01-06 DIAGNOSIS — D689 Coagulation defect, unspecified: Secondary | ICD-10-CM | POA: Diagnosis not present

## 2023-01-06 DIAGNOSIS — N186 End stage renal disease: Secondary | ICD-10-CM | POA: Diagnosis not present

## 2023-01-06 DIAGNOSIS — N2581 Secondary hyperparathyroidism of renal origin: Secondary | ICD-10-CM | POA: Diagnosis not present

## 2023-01-06 DIAGNOSIS — D631 Anemia in chronic kidney disease: Secondary | ICD-10-CM | POA: Diagnosis not present

## 2023-01-08 DIAGNOSIS — N186 End stage renal disease: Secondary | ICD-10-CM | POA: Diagnosis not present

## 2023-01-08 DIAGNOSIS — Z992 Dependence on renal dialysis: Secondary | ICD-10-CM | POA: Diagnosis not present

## 2023-01-08 DIAGNOSIS — L299 Pruritus, unspecified: Secondary | ICD-10-CM | POA: Diagnosis not present

## 2023-01-08 DIAGNOSIS — D631 Anemia in chronic kidney disease: Secondary | ICD-10-CM | POA: Diagnosis not present

## 2023-01-08 DIAGNOSIS — Z5181 Encounter for therapeutic drug level monitoring: Secondary | ICD-10-CM | POA: Diagnosis not present

## 2023-01-08 DIAGNOSIS — N2581 Secondary hyperparathyroidism of renal origin: Secondary | ICD-10-CM | POA: Diagnosis not present

## 2023-01-08 DIAGNOSIS — D689 Coagulation defect, unspecified: Secondary | ICD-10-CM | POA: Diagnosis not present

## 2023-01-11 DIAGNOSIS — N2581 Secondary hyperparathyroidism of renal origin: Secondary | ICD-10-CM | POA: Diagnosis not present

## 2023-01-11 DIAGNOSIS — Z5181 Encounter for therapeutic drug level monitoring: Secondary | ICD-10-CM | POA: Diagnosis not present

## 2023-01-11 DIAGNOSIS — L299 Pruritus, unspecified: Secondary | ICD-10-CM | POA: Diagnosis not present

## 2023-01-11 DIAGNOSIS — N186 End stage renal disease: Secondary | ICD-10-CM | POA: Diagnosis not present

## 2023-01-11 DIAGNOSIS — Z992 Dependence on renal dialysis: Secondary | ICD-10-CM | POA: Diagnosis not present

## 2023-01-11 DIAGNOSIS — D631 Anemia in chronic kidney disease: Secondary | ICD-10-CM | POA: Diagnosis not present

## 2023-01-11 DIAGNOSIS — D689 Coagulation defect, unspecified: Secondary | ICD-10-CM | POA: Diagnosis not present

## 2023-01-11 IMAGING — CT CT ABD-PELV W/O CM
2 of 4 series · 15 of 46 positions shown, 17 images · non-contrast
Comparison: None.

CLINICAL DATA: Right lower quadrant abdominal pain with nausea.
End-stage renal disease on dialysis. History of renal transplant in
2622.

EXAM:
CT ABDOMEN AND PELVIS WITHOUT CONTRAST
TECHNIQUE: Multidetector CT imaging of the abdomen and pelvis was performed
following the standard protocol without IV contrast.

[Series 3: a/p w/o 5mm · axial · non-contrast · 0.80mm/px · z∈[+860,+1260]mm · 12 of 94 slices shown, 14 images]
[im 7/94  soft-tissue]
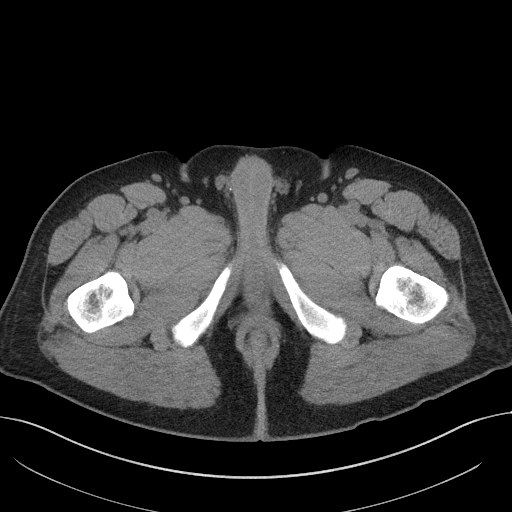
[im 7/94  bone]
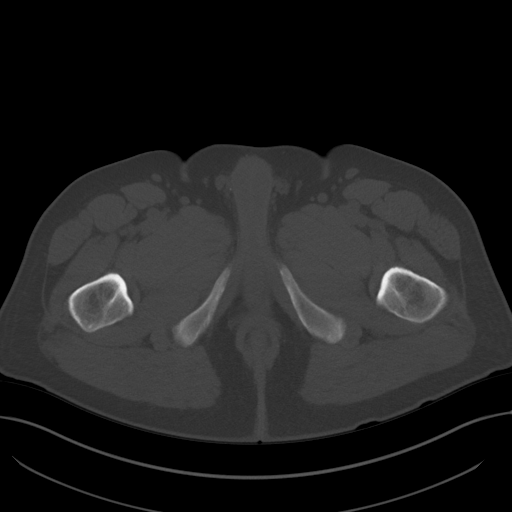
[im 13/94  soft-tissue]
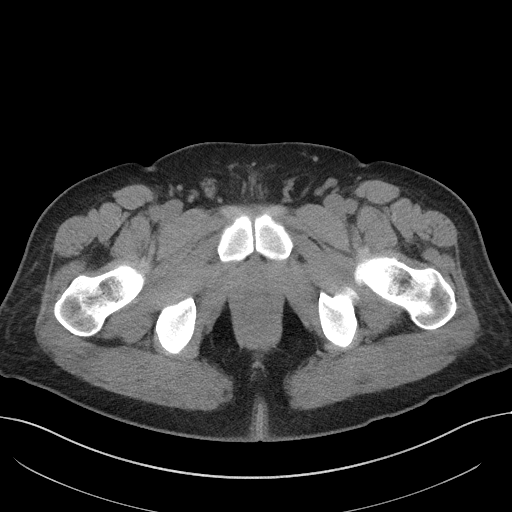
[im 19/94  soft-tissue]
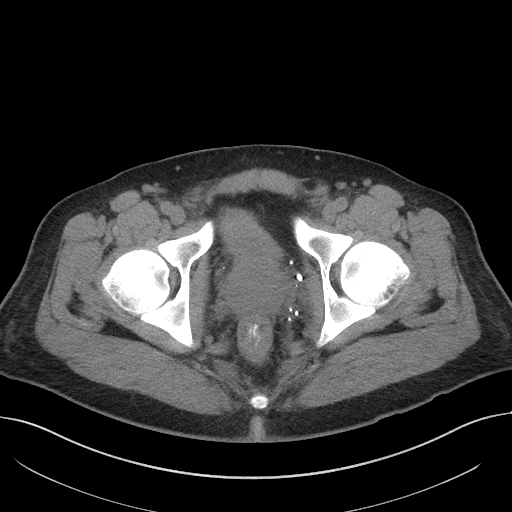
[im 32/94  soft-tissue]
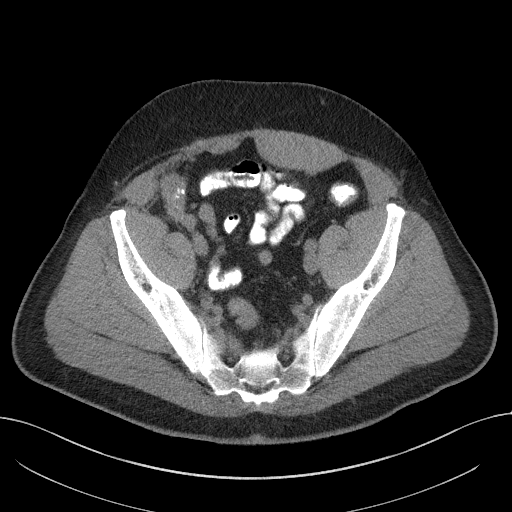
[im 38/94  soft-tissue]
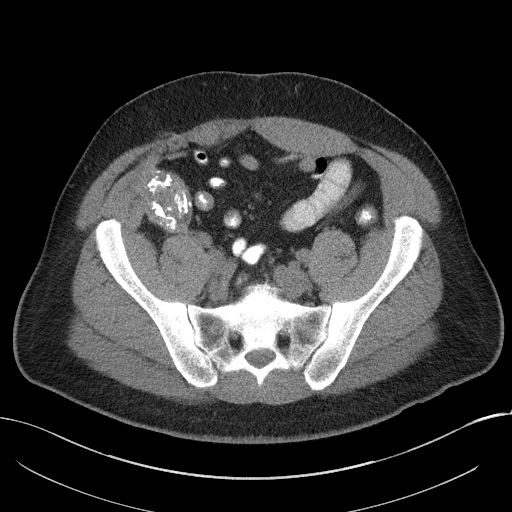
[im 44/94  soft-tissue]
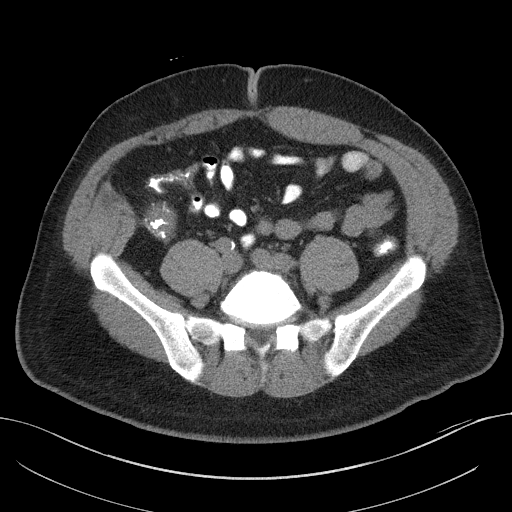
[im 50/94  soft-tissue]
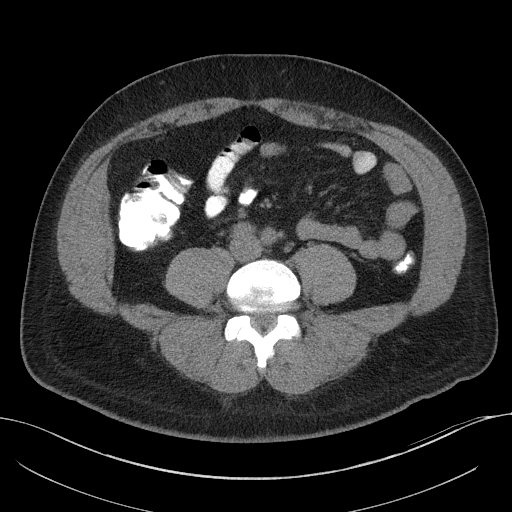
[im 56/94  soft-tissue]
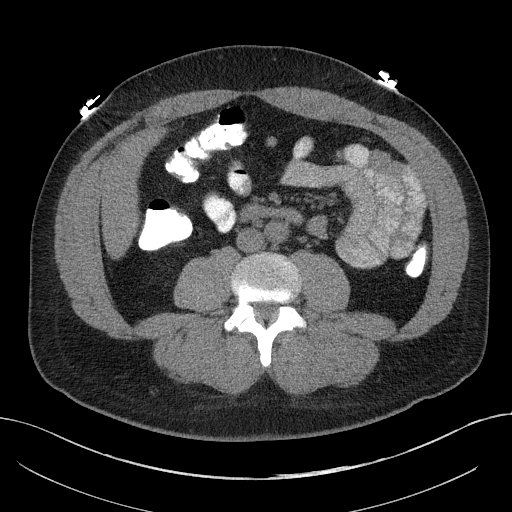
[im 63/94  soft-tissue]
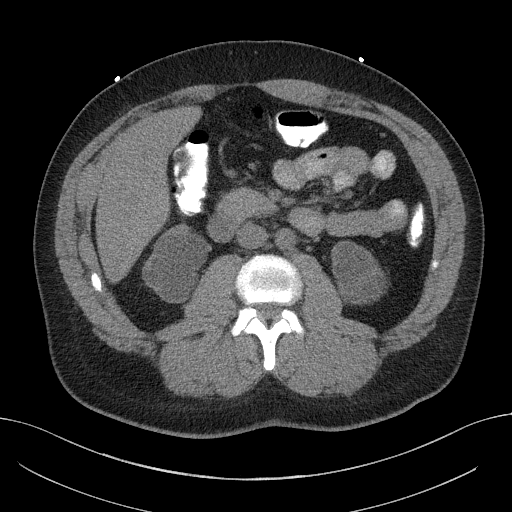
[im 63/94  bone]
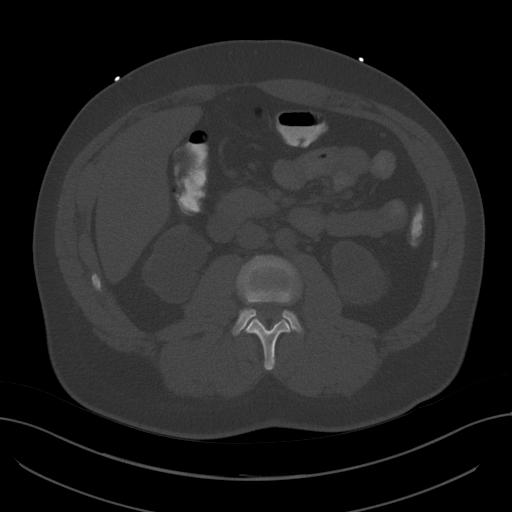
[im 75/94  soft-tissue]
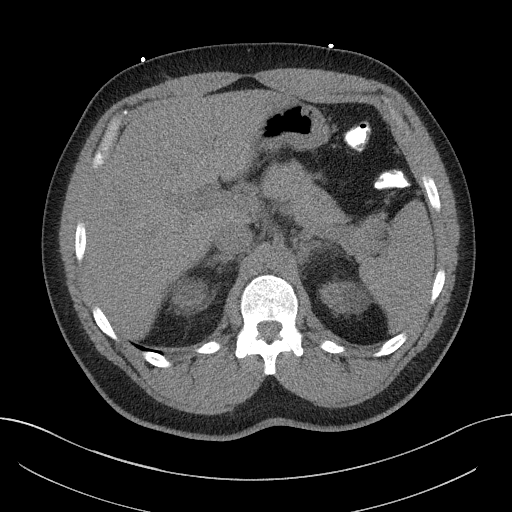
[im 81/94  soft-tissue]
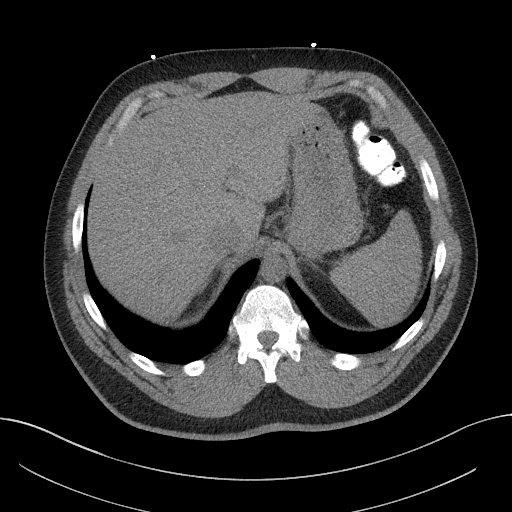
[im 87/94  soft-tissue]
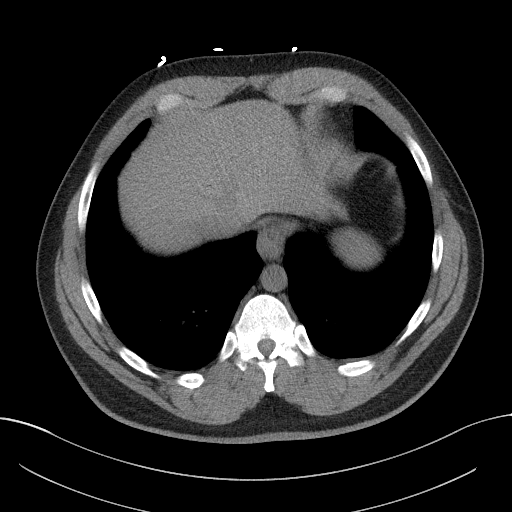

[Series 6: a/p w/o cor · coronal · non-contrast · 0.85mm/px · 3 of 160 slices shown]
[im 54/160  soft-tissue]
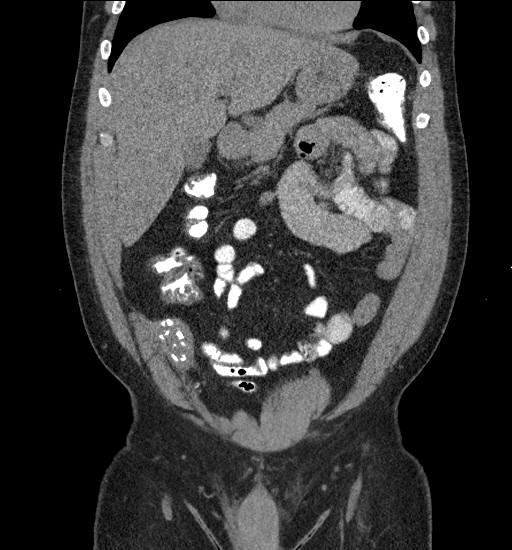
[im 71/160  soft-tissue]
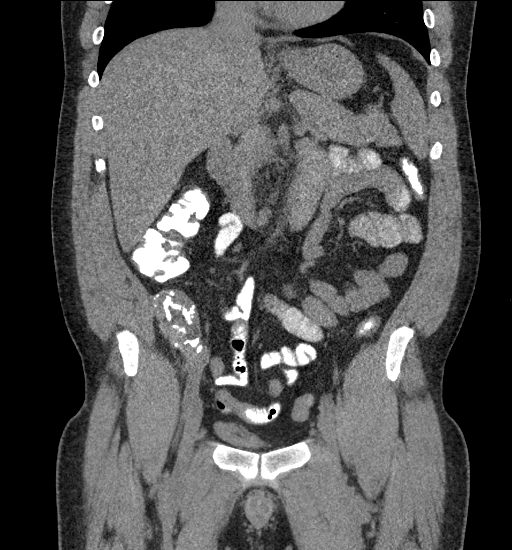
[im 89/160  soft-tissue]
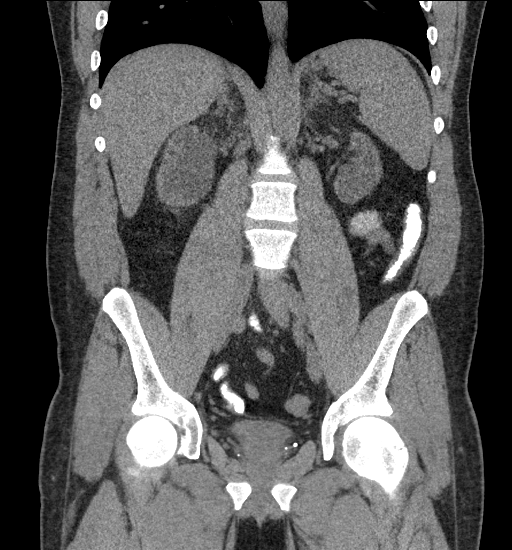

[15 of 46 positions shown; findings below may reference images not displayed]

FINDINGS: Lower chest: Included lung bases are clear.  Heart size is normal.

Hepatobiliary: Unremarkable unenhanced appearance of the liver. No
focal liver lesion identified. Gallbladder within normal limits. No
hyperdense gallstone. No biliary dilatation.

Pancreas: Unremarkable. No pancreatic ductal dilatation or
surrounding inflammatory changes.

Spleen: Normal in size without focal abnormality.

Adrenals/Urinary Tract: Unremarkable adrenal glands. Multiple
rounded low-density lesions throughout both kidneys. Rounded 3.2 cm
lesion at the upper pole of the right kidney with internal layering
hyperdensity which may represent internal hemorrhagic or
proteinaceous contents within a cyst. Solid renal lesion not
entirely excluded. Parenchymal thinning of both kidneys. No renal
stone or hydronephrosis. Ureters are nondilated. Urinary bladder is
decompressed.

Stomach/Bowel: There is oral contrast present throughout the distal
small bowel and colon. Small hiatal hernia. Stomach otherwise within
normal limits. No dilated loops of small bowel. There is a normal
noninflamed appendix present within the right lower quadrant (series
3, image 55). Long segment mild circumferential wall thickening of
the rectosigmoid colon. No pericolonic inflammatory changes or
fluid.

Vascular/Lymphatic: Minimal scattered atherosclerotic
calcifications. No abdominal aortic aneurysm. No enlarged
abdominopelvic lymph nodes.

Reproductive: Prostate is unremarkable.

Other: Well-defined ovoid mass in the right iliac fossa measuring
approximately 7.3 x 3.7 x 6.0 cm with extensive internal
calcification suggestive of a failed transplant kidney. No ascites.
No abdominopelvic fluid collection. No abdominal wall hernia.

Musculoskeletal: No acute osseous findings. Findings suggestive of
bilateral femoral head avascular necrosis without cortical collapse.
Degenerative disc disease with associated endplate changes at L5-S1.
IMPRESSION: 1. Long segment mild circumferential wall thickening of the
rectosigmoid colon without pericolonic inflammatory changes.
Findings are favored to represent a mild colitis.
2. Normal appendix.
3. Multiple rounded low-density lesions throughout both kidneys.
Rounded 3.2 cm lesion at the upper pole of the right kidney with
internal layering hyperdensity which may represent internal
hemorrhagic or proteinaceous contents within a cyst. Solid renal
lesion not entirely excluded. Further evaluation with nonemergent
renal ultrasound is recommended.
4. Well-defined calcified mass in the right iliac fossa measuring up
to 7.3 cm suggestive of a failed transplant kidney.
5. Bilateral hip avascular necrosis without articular surface
collapse.

## 2023-01-13 DIAGNOSIS — D631 Anemia in chronic kidney disease: Secondary | ICD-10-CM | POA: Diagnosis not present

## 2023-01-13 DIAGNOSIS — N2581 Secondary hyperparathyroidism of renal origin: Secondary | ICD-10-CM | POA: Diagnosis not present

## 2023-01-13 DIAGNOSIS — L299 Pruritus, unspecified: Secondary | ICD-10-CM | POA: Diagnosis not present

## 2023-01-13 DIAGNOSIS — N186 End stage renal disease: Secondary | ICD-10-CM | POA: Diagnosis not present

## 2023-01-13 DIAGNOSIS — D689 Coagulation defect, unspecified: Secondary | ICD-10-CM | POA: Diagnosis not present

## 2023-01-13 DIAGNOSIS — T8610 Unspecified complication of kidney transplant: Secondary | ICD-10-CM | POA: Diagnosis not present

## 2023-01-13 DIAGNOSIS — Z5181 Encounter for therapeutic drug level monitoring: Secondary | ICD-10-CM | POA: Diagnosis not present

## 2023-01-13 DIAGNOSIS — Z992 Dependence on renal dialysis: Secondary | ICD-10-CM | POA: Diagnosis not present

## 2023-01-15 DIAGNOSIS — D631 Anemia in chronic kidney disease: Secondary | ICD-10-CM | POA: Diagnosis not present

## 2023-01-15 DIAGNOSIS — Z992 Dependence on renal dialysis: Secondary | ICD-10-CM | POA: Diagnosis not present

## 2023-01-15 DIAGNOSIS — N186 End stage renal disease: Secondary | ICD-10-CM | POA: Diagnosis not present

## 2023-01-15 DIAGNOSIS — D689 Coagulation defect, unspecified: Secondary | ICD-10-CM | POA: Diagnosis not present

## 2023-01-15 DIAGNOSIS — N2581 Secondary hyperparathyroidism of renal origin: Secondary | ICD-10-CM | POA: Diagnosis not present

## 2023-01-15 DIAGNOSIS — Z5181 Encounter for therapeutic drug level monitoring: Secondary | ICD-10-CM | POA: Diagnosis not present

## 2023-01-18 DIAGNOSIS — Z5181 Encounter for therapeutic drug level monitoring: Secondary | ICD-10-CM | POA: Diagnosis not present

## 2023-01-18 DIAGNOSIS — N2581 Secondary hyperparathyroidism of renal origin: Secondary | ICD-10-CM | POA: Diagnosis not present

## 2023-01-18 DIAGNOSIS — D689 Coagulation defect, unspecified: Secondary | ICD-10-CM | POA: Diagnosis not present

## 2023-01-18 DIAGNOSIS — N186 End stage renal disease: Secondary | ICD-10-CM | POA: Diagnosis not present

## 2023-01-18 DIAGNOSIS — D631 Anemia in chronic kidney disease: Secondary | ICD-10-CM | POA: Diagnosis not present

## 2023-01-18 DIAGNOSIS — Z992 Dependence on renal dialysis: Secondary | ICD-10-CM | POA: Diagnosis not present

## 2023-01-20 DIAGNOSIS — N186 End stage renal disease: Secondary | ICD-10-CM | POA: Diagnosis not present

## 2023-01-20 DIAGNOSIS — D689 Coagulation defect, unspecified: Secondary | ICD-10-CM | POA: Diagnosis not present

## 2023-01-20 DIAGNOSIS — Z992 Dependence on renal dialysis: Secondary | ICD-10-CM | POA: Diagnosis not present

## 2023-01-20 DIAGNOSIS — Z5181 Encounter for therapeutic drug level monitoring: Secondary | ICD-10-CM | POA: Diagnosis not present

## 2023-01-20 DIAGNOSIS — D631 Anemia in chronic kidney disease: Secondary | ICD-10-CM | POA: Diagnosis not present

## 2023-01-20 DIAGNOSIS — N2581 Secondary hyperparathyroidism of renal origin: Secondary | ICD-10-CM | POA: Diagnosis not present

## 2023-01-22 DIAGNOSIS — Z992 Dependence on renal dialysis: Secondary | ICD-10-CM | POA: Diagnosis not present

## 2023-01-22 DIAGNOSIS — N186 End stage renal disease: Secondary | ICD-10-CM | POA: Diagnosis not present

## 2023-01-22 DIAGNOSIS — D631 Anemia in chronic kidney disease: Secondary | ICD-10-CM | POA: Diagnosis not present

## 2023-01-22 DIAGNOSIS — N2581 Secondary hyperparathyroidism of renal origin: Secondary | ICD-10-CM | POA: Diagnosis not present

## 2023-01-22 DIAGNOSIS — D689 Coagulation defect, unspecified: Secondary | ICD-10-CM | POA: Diagnosis not present

## 2023-01-22 DIAGNOSIS — Z5181 Encounter for therapeutic drug level monitoring: Secondary | ICD-10-CM | POA: Diagnosis not present

## 2023-01-25 DIAGNOSIS — D689 Coagulation defect, unspecified: Secondary | ICD-10-CM | POA: Diagnosis not present

## 2023-01-25 DIAGNOSIS — N186 End stage renal disease: Secondary | ICD-10-CM | POA: Diagnosis not present

## 2023-01-25 DIAGNOSIS — Z5181 Encounter for therapeutic drug level monitoring: Secondary | ICD-10-CM | POA: Diagnosis not present

## 2023-01-25 DIAGNOSIS — N2581 Secondary hyperparathyroidism of renal origin: Secondary | ICD-10-CM | POA: Diagnosis not present

## 2023-01-25 DIAGNOSIS — D631 Anemia in chronic kidney disease: Secondary | ICD-10-CM | POA: Diagnosis not present

## 2023-01-25 DIAGNOSIS — Z992 Dependence on renal dialysis: Secondary | ICD-10-CM | POA: Diagnosis not present

## 2023-01-27 DIAGNOSIS — Z5181 Encounter for therapeutic drug level monitoring: Secondary | ICD-10-CM | POA: Diagnosis not present

## 2023-01-27 DIAGNOSIS — Z992 Dependence on renal dialysis: Secondary | ICD-10-CM | POA: Diagnosis not present

## 2023-01-27 DIAGNOSIS — D631 Anemia in chronic kidney disease: Secondary | ICD-10-CM | POA: Diagnosis not present

## 2023-01-27 DIAGNOSIS — D689 Coagulation defect, unspecified: Secondary | ICD-10-CM | POA: Diagnosis not present

## 2023-01-27 DIAGNOSIS — N2581 Secondary hyperparathyroidism of renal origin: Secondary | ICD-10-CM | POA: Diagnosis not present

## 2023-01-27 DIAGNOSIS — N186 End stage renal disease: Secondary | ICD-10-CM | POA: Diagnosis not present

## 2023-01-29 DIAGNOSIS — D631 Anemia in chronic kidney disease: Secondary | ICD-10-CM | POA: Diagnosis not present

## 2023-01-29 DIAGNOSIS — Z992 Dependence on renal dialysis: Secondary | ICD-10-CM | POA: Diagnosis not present

## 2023-01-29 DIAGNOSIS — D689 Coagulation defect, unspecified: Secondary | ICD-10-CM | POA: Diagnosis not present

## 2023-01-29 DIAGNOSIS — N2581 Secondary hyperparathyroidism of renal origin: Secondary | ICD-10-CM | POA: Diagnosis not present

## 2023-01-29 DIAGNOSIS — Z5181 Encounter for therapeutic drug level monitoring: Secondary | ICD-10-CM | POA: Diagnosis not present

## 2023-01-29 DIAGNOSIS — N186 End stage renal disease: Secondary | ICD-10-CM | POA: Diagnosis not present

## 2023-02-01 DIAGNOSIS — Z992 Dependence on renal dialysis: Secondary | ICD-10-CM | POA: Diagnosis not present

## 2023-02-01 DIAGNOSIS — N2581 Secondary hyperparathyroidism of renal origin: Secondary | ICD-10-CM | POA: Diagnosis not present

## 2023-02-01 DIAGNOSIS — Z5181 Encounter for therapeutic drug level monitoring: Secondary | ICD-10-CM | POA: Diagnosis not present

## 2023-02-01 DIAGNOSIS — D631 Anemia in chronic kidney disease: Secondary | ICD-10-CM | POA: Diagnosis not present

## 2023-02-01 DIAGNOSIS — N186 End stage renal disease: Secondary | ICD-10-CM | POA: Diagnosis not present

## 2023-02-01 DIAGNOSIS — D689 Coagulation defect, unspecified: Secondary | ICD-10-CM | POA: Diagnosis not present

## 2023-02-02 DIAGNOSIS — D689 Coagulation defect, unspecified: Secondary | ICD-10-CM | POA: Diagnosis not present

## 2023-02-02 DIAGNOSIS — Z992 Dependence on renal dialysis: Secondary | ICD-10-CM | POA: Diagnosis not present

## 2023-02-02 DIAGNOSIS — D631 Anemia in chronic kidney disease: Secondary | ICD-10-CM | POA: Diagnosis not present

## 2023-02-02 DIAGNOSIS — N2581 Secondary hyperparathyroidism of renal origin: Secondary | ICD-10-CM | POA: Diagnosis not present

## 2023-02-02 DIAGNOSIS — Z5181 Encounter for therapeutic drug level monitoring: Secondary | ICD-10-CM | POA: Diagnosis not present

## 2023-02-02 DIAGNOSIS — N186 End stage renal disease: Secondary | ICD-10-CM | POA: Diagnosis not present

## 2023-02-03 DIAGNOSIS — Z79899 Other long term (current) drug therapy: Secondary | ICD-10-CM | POA: Diagnosis not present

## 2023-02-03 DIAGNOSIS — D631 Anemia in chronic kidney disease: Secondary | ICD-10-CM | POA: Diagnosis not present

## 2023-02-03 DIAGNOSIS — Z992 Dependence on renal dialysis: Secondary | ICD-10-CM | POA: Diagnosis not present

## 2023-02-03 DIAGNOSIS — R519 Headache, unspecified: Secondary | ICD-10-CM | POA: Diagnosis not present

## 2023-02-03 DIAGNOSIS — I120 Hypertensive chronic kidney disease with stage 5 chronic kidney disease or end stage renal disease: Secondary | ICD-10-CM | POA: Diagnosis not present

## 2023-02-03 DIAGNOSIS — N186 End stage renal disease: Secondary | ICD-10-CM | POA: Diagnosis not present

## 2023-02-03 DIAGNOSIS — K219 Gastro-esophageal reflux disease without esophagitis: Secondary | ICD-10-CM | POA: Diagnosis not present

## 2023-02-04 DIAGNOSIS — I129 Hypertensive chronic kidney disease with stage 1 through stage 4 chronic kidney disease, or unspecified chronic kidney disease: Secondary | ICD-10-CM | POA: Diagnosis not present

## 2023-02-04 DIAGNOSIS — N186 End stage renal disease: Secondary | ICD-10-CM | POA: Diagnosis not present

## 2023-02-04 DIAGNOSIS — Z992 Dependence on renal dialysis: Secondary | ICD-10-CM | POA: Diagnosis not present

## 2023-02-05 DIAGNOSIS — D689 Coagulation defect, unspecified: Secondary | ICD-10-CM | POA: Diagnosis not present

## 2023-02-05 DIAGNOSIS — N186 End stage renal disease: Secondary | ICD-10-CM | POA: Diagnosis not present

## 2023-02-05 DIAGNOSIS — D631 Anemia in chronic kidney disease: Secondary | ICD-10-CM | POA: Diagnosis not present

## 2023-02-05 DIAGNOSIS — Z992 Dependence on renal dialysis: Secondary | ICD-10-CM | POA: Diagnosis not present

## 2023-02-05 DIAGNOSIS — N2581 Secondary hyperparathyroidism of renal origin: Secondary | ICD-10-CM | POA: Diagnosis not present

## 2023-02-05 DIAGNOSIS — Z5181 Encounter for therapeutic drug level monitoring: Secondary | ICD-10-CM | POA: Diagnosis not present

## 2023-02-08 DIAGNOSIS — N2581 Secondary hyperparathyroidism of renal origin: Secondary | ICD-10-CM | POA: Diagnosis not present

## 2023-02-08 DIAGNOSIS — Z992 Dependence on renal dialysis: Secondary | ICD-10-CM | POA: Diagnosis not present

## 2023-02-08 DIAGNOSIS — N186 End stage renal disease: Secondary | ICD-10-CM | POA: Diagnosis not present

## 2023-02-08 DIAGNOSIS — D631 Anemia in chronic kidney disease: Secondary | ICD-10-CM | POA: Diagnosis not present

## 2023-02-08 DIAGNOSIS — D689 Coagulation defect, unspecified: Secondary | ICD-10-CM | POA: Diagnosis not present

## 2023-02-08 DIAGNOSIS — Z5181 Encounter for therapeutic drug level monitoring: Secondary | ICD-10-CM | POA: Diagnosis not present

## 2023-02-10 DIAGNOSIS — Z992 Dependence on renal dialysis: Secondary | ICD-10-CM | POA: Diagnosis not present

## 2023-02-10 DIAGNOSIS — N2581 Secondary hyperparathyroidism of renal origin: Secondary | ICD-10-CM | POA: Diagnosis not present

## 2023-02-10 DIAGNOSIS — Z5181 Encounter for therapeutic drug level monitoring: Secondary | ICD-10-CM | POA: Diagnosis not present

## 2023-02-10 DIAGNOSIS — D631 Anemia in chronic kidney disease: Secondary | ICD-10-CM | POA: Diagnosis not present

## 2023-02-10 DIAGNOSIS — N186 End stage renal disease: Secondary | ICD-10-CM | POA: Diagnosis not present

## 2023-02-10 DIAGNOSIS — D689 Coagulation defect, unspecified: Secondary | ICD-10-CM | POA: Diagnosis not present

## 2023-02-11 DIAGNOSIS — Z992 Dependence on renal dialysis: Secondary | ICD-10-CM | POA: Diagnosis not present

## 2023-02-11 DIAGNOSIS — T8610 Unspecified complication of kidney transplant: Secondary | ICD-10-CM | POA: Diagnosis not present

## 2023-02-11 DIAGNOSIS — N186 End stage renal disease: Secondary | ICD-10-CM | POA: Diagnosis not present

## 2023-02-12 DIAGNOSIS — D689 Coagulation defect, unspecified: Secondary | ICD-10-CM | POA: Diagnosis not present

## 2023-02-12 DIAGNOSIS — N2581 Secondary hyperparathyroidism of renal origin: Secondary | ICD-10-CM | POA: Diagnosis not present

## 2023-02-12 DIAGNOSIS — N186 End stage renal disease: Secondary | ICD-10-CM | POA: Diagnosis not present

## 2023-02-12 DIAGNOSIS — Z992 Dependence on renal dialysis: Secondary | ICD-10-CM | POA: Diagnosis not present

## 2023-02-12 DIAGNOSIS — L299 Pruritus, unspecified: Secondary | ICD-10-CM | POA: Diagnosis not present

## 2023-02-12 DIAGNOSIS — Z5181 Encounter for therapeutic drug level monitoring: Secondary | ICD-10-CM | POA: Diagnosis not present

## 2023-02-12 DIAGNOSIS — D631 Anemia in chronic kidney disease: Secondary | ICD-10-CM | POA: Diagnosis not present

## 2023-02-15 DIAGNOSIS — D631 Anemia in chronic kidney disease: Secondary | ICD-10-CM | POA: Diagnosis not present

## 2023-02-15 DIAGNOSIS — N186 End stage renal disease: Secondary | ICD-10-CM | POA: Diagnosis not present

## 2023-02-15 DIAGNOSIS — Z5181 Encounter for therapeutic drug level monitoring: Secondary | ICD-10-CM | POA: Diagnosis not present

## 2023-02-15 DIAGNOSIS — Z992 Dependence on renal dialysis: Secondary | ICD-10-CM | POA: Diagnosis not present

## 2023-02-15 DIAGNOSIS — D689 Coagulation defect, unspecified: Secondary | ICD-10-CM | POA: Diagnosis not present

## 2023-02-15 DIAGNOSIS — N2581 Secondary hyperparathyroidism of renal origin: Secondary | ICD-10-CM | POA: Diagnosis not present

## 2023-02-15 DIAGNOSIS — L299 Pruritus, unspecified: Secondary | ICD-10-CM | POA: Diagnosis not present

## 2023-02-17 DIAGNOSIS — L299 Pruritus, unspecified: Secondary | ICD-10-CM | POA: Diagnosis not present

## 2023-02-17 DIAGNOSIS — N186 End stage renal disease: Secondary | ICD-10-CM | POA: Diagnosis not present

## 2023-02-17 DIAGNOSIS — D689 Coagulation defect, unspecified: Secondary | ICD-10-CM | POA: Diagnosis not present

## 2023-02-17 DIAGNOSIS — N2581 Secondary hyperparathyroidism of renal origin: Secondary | ICD-10-CM | POA: Diagnosis not present

## 2023-02-17 DIAGNOSIS — Z992 Dependence on renal dialysis: Secondary | ICD-10-CM | POA: Diagnosis not present

## 2023-02-17 DIAGNOSIS — D631 Anemia in chronic kidney disease: Secondary | ICD-10-CM | POA: Diagnosis not present

## 2023-02-17 DIAGNOSIS — Z5181 Encounter for therapeutic drug level monitoring: Secondary | ICD-10-CM | POA: Diagnosis not present

## 2023-02-19 DIAGNOSIS — Z992 Dependence on renal dialysis: Secondary | ICD-10-CM | POA: Diagnosis not present

## 2023-02-19 DIAGNOSIS — L299 Pruritus, unspecified: Secondary | ICD-10-CM | POA: Diagnosis not present

## 2023-02-19 DIAGNOSIS — D631 Anemia in chronic kidney disease: Secondary | ICD-10-CM | POA: Diagnosis not present

## 2023-02-19 DIAGNOSIS — Z5181 Encounter for therapeutic drug level monitoring: Secondary | ICD-10-CM | POA: Diagnosis not present

## 2023-02-19 DIAGNOSIS — N2581 Secondary hyperparathyroidism of renal origin: Secondary | ICD-10-CM | POA: Diagnosis not present

## 2023-02-19 DIAGNOSIS — D689 Coagulation defect, unspecified: Secondary | ICD-10-CM | POA: Diagnosis not present

## 2023-02-19 DIAGNOSIS — N186 End stage renal disease: Secondary | ICD-10-CM | POA: Diagnosis not present

## 2023-02-22 DIAGNOSIS — L299 Pruritus, unspecified: Secondary | ICD-10-CM | POA: Diagnosis not present

## 2023-02-22 DIAGNOSIS — Z992 Dependence on renal dialysis: Secondary | ICD-10-CM | POA: Diagnosis not present

## 2023-02-22 DIAGNOSIS — N2581 Secondary hyperparathyroidism of renal origin: Secondary | ICD-10-CM | POA: Diagnosis not present

## 2023-02-22 DIAGNOSIS — Z5181 Encounter for therapeutic drug level monitoring: Secondary | ICD-10-CM | POA: Diagnosis not present

## 2023-02-22 DIAGNOSIS — D631 Anemia in chronic kidney disease: Secondary | ICD-10-CM | POA: Diagnosis not present

## 2023-02-22 DIAGNOSIS — N186 End stage renal disease: Secondary | ICD-10-CM | POA: Diagnosis not present

## 2023-02-22 DIAGNOSIS — D689 Coagulation defect, unspecified: Secondary | ICD-10-CM | POA: Diagnosis not present

## 2023-02-24 DIAGNOSIS — Z992 Dependence on renal dialysis: Secondary | ICD-10-CM | POA: Diagnosis not present

## 2023-02-24 DIAGNOSIS — D631 Anemia in chronic kidney disease: Secondary | ICD-10-CM | POA: Diagnosis not present

## 2023-02-24 DIAGNOSIS — Z5181 Encounter for therapeutic drug level monitoring: Secondary | ICD-10-CM | POA: Diagnosis not present

## 2023-02-24 DIAGNOSIS — D689 Coagulation defect, unspecified: Secondary | ICD-10-CM | POA: Diagnosis not present

## 2023-02-24 DIAGNOSIS — N186 End stage renal disease: Secondary | ICD-10-CM | POA: Diagnosis not present

## 2023-02-24 DIAGNOSIS — L299 Pruritus, unspecified: Secondary | ICD-10-CM | POA: Diagnosis not present

## 2023-02-24 DIAGNOSIS — N2581 Secondary hyperparathyroidism of renal origin: Secondary | ICD-10-CM | POA: Diagnosis not present

## 2023-02-26 DIAGNOSIS — Z5181 Encounter for therapeutic drug level monitoring: Secondary | ICD-10-CM | POA: Diagnosis not present

## 2023-02-26 DIAGNOSIS — N2581 Secondary hyperparathyroidism of renal origin: Secondary | ICD-10-CM | POA: Diagnosis not present

## 2023-02-26 DIAGNOSIS — D631 Anemia in chronic kidney disease: Secondary | ICD-10-CM | POA: Diagnosis not present

## 2023-02-26 DIAGNOSIS — D689 Coagulation defect, unspecified: Secondary | ICD-10-CM | POA: Diagnosis not present

## 2023-02-26 DIAGNOSIS — L299 Pruritus, unspecified: Secondary | ICD-10-CM | POA: Diagnosis not present

## 2023-02-26 DIAGNOSIS — N186 End stage renal disease: Secondary | ICD-10-CM | POA: Diagnosis not present

## 2023-02-26 DIAGNOSIS — Z992 Dependence on renal dialysis: Secondary | ICD-10-CM | POA: Diagnosis not present

## 2023-03-01 DIAGNOSIS — Z992 Dependence on renal dialysis: Secondary | ICD-10-CM | POA: Diagnosis not present

## 2023-03-01 DIAGNOSIS — Z5181 Encounter for therapeutic drug level monitoring: Secondary | ICD-10-CM | POA: Diagnosis not present

## 2023-03-01 DIAGNOSIS — L299 Pruritus, unspecified: Secondary | ICD-10-CM | POA: Diagnosis not present

## 2023-03-01 DIAGNOSIS — N186 End stage renal disease: Secondary | ICD-10-CM | POA: Diagnosis not present

## 2023-03-01 DIAGNOSIS — D631 Anemia in chronic kidney disease: Secondary | ICD-10-CM | POA: Diagnosis not present

## 2023-03-01 DIAGNOSIS — D689 Coagulation defect, unspecified: Secondary | ICD-10-CM | POA: Diagnosis not present

## 2023-03-01 DIAGNOSIS — N2581 Secondary hyperparathyroidism of renal origin: Secondary | ICD-10-CM | POA: Diagnosis not present

## 2023-03-03 DIAGNOSIS — Z992 Dependence on renal dialysis: Secondary | ICD-10-CM | POA: Diagnosis not present

## 2023-03-03 DIAGNOSIS — N186 End stage renal disease: Secondary | ICD-10-CM | POA: Diagnosis not present

## 2023-03-03 DIAGNOSIS — N2581 Secondary hyperparathyroidism of renal origin: Secondary | ICD-10-CM | POA: Diagnosis not present

## 2023-03-03 DIAGNOSIS — D631 Anemia in chronic kidney disease: Secondary | ICD-10-CM | POA: Diagnosis not present

## 2023-03-03 DIAGNOSIS — Z5181 Encounter for therapeutic drug level monitoring: Secondary | ICD-10-CM | POA: Diagnosis not present

## 2023-03-03 DIAGNOSIS — D689 Coagulation defect, unspecified: Secondary | ICD-10-CM | POA: Diagnosis not present

## 2023-03-03 DIAGNOSIS — L299 Pruritus, unspecified: Secondary | ICD-10-CM | POA: Diagnosis not present

## 2023-03-05 DIAGNOSIS — N2581 Secondary hyperparathyroidism of renal origin: Secondary | ICD-10-CM | POA: Diagnosis not present

## 2023-03-05 DIAGNOSIS — Z5181 Encounter for therapeutic drug level monitoring: Secondary | ICD-10-CM | POA: Diagnosis not present

## 2023-03-05 DIAGNOSIS — D689 Coagulation defect, unspecified: Secondary | ICD-10-CM | POA: Diagnosis not present

## 2023-03-05 DIAGNOSIS — N186 End stage renal disease: Secondary | ICD-10-CM | POA: Diagnosis not present

## 2023-03-05 DIAGNOSIS — L299 Pruritus, unspecified: Secondary | ICD-10-CM | POA: Diagnosis not present

## 2023-03-05 DIAGNOSIS — D631 Anemia in chronic kidney disease: Secondary | ICD-10-CM | POA: Diagnosis not present

## 2023-03-05 DIAGNOSIS — Z992 Dependence on renal dialysis: Secondary | ICD-10-CM | POA: Diagnosis not present

## 2023-03-08 DIAGNOSIS — Z5181 Encounter for therapeutic drug level monitoring: Secondary | ICD-10-CM | POA: Diagnosis not present

## 2023-03-08 DIAGNOSIS — Z992 Dependence on renal dialysis: Secondary | ICD-10-CM | POA: Diagnosis not present

## 2023-03-08 DIAGNOSIS — N2581 Secondary hyperparathyroidism of renal origin: Secondary | ICD-10-CM | POA: Diagnosis not present

## 2023-03-08 DIAGNOSIS — D689 Coagulation defect, unspecified: Secondary | ICD-10-CM | POA: Diagnosis not present

## 2023-03-08 DIAGNOSIS — L299 Pruritus, unspecified: Secondary | ICD-10-CM | POA: Diagnosis not present

## 2023-03-08 DIAGNOSIS — D631 Anemia in chronic kidney disease: Secondary | ICD-10-CM | POA: Diagnosis not present

## 2023-03-08 DIAGNOSIS — N186 End stage renal disease: Secondary | ICD-10-CM | POA: Diagnosis not present

## 2023-03-10 DIAGNOSIS — N186 End stage renal disease: Secondary | ICD-10-CM | POA: Diagnosis not present

## 2023-03-10 DIAGNOSIS — L299 Pruritus, unspecified: Secondary | ICD-10-CM | POA: Diagnosis not present

## 2023-03-10 DIAGNOSIS — D689 Coagulation defect, unspecified: Secondary | ICD-10-CM | POA: Diagnosis not present

## 2023-03-10 DIAGNOSIS — D631 Anemia in chronic kidney disease: Secondary | ICD-10-CM | POA: Diagnosis not present

## 2023-03-10 DIAGNOSIS — N2581 Secondary hyperparathyroidism of renal origin: Secondary | ICD-10-CM | POA: Diagnosis not present

## 2023-03-10 DIAGNOSIS — Z5181 Encounter for therapeutic drug level monitoring: Secondary | ICD-10-CM | POA: Diagnosis not present

## 2023-03-10 DIAGNOSIS — Z992 Dependence on renal dialysis: Secondary | ICD-10-CM | POA: Diagnosis not present

## 2023-03-11 DIAGNOSIS — N62 Hypertrophy of breast: Secondary | ICD-10-CM | POA: Diagnosis not present

## 2023-03-11 DIAGNOSIS — K5730 Diverticulosis of large intestine without perforation or abscess without bleeding: Secondary | ICD-10-CM | POA: Diagnosis not present

## 2023-03-11 DIAGNOSIS — N281 Cyst of kidney, acquired: Secondary | ICD-10-CM | POA: Diagnosis not present

## 2023-03-11 DIAGNOSIS — T8612 Kidney transplant failure: Secondary | ICD-10-CM | POA: Diagnosis not present

## 2023-03-12 DIAGNOSIS — L299 Pruritus, unspecified: Secondary | ICD-10-CM | POA: Diagnosis not present

## 2023-03-12 DIAGNOSIS — D631 Anemia in chronic kidney disease: Secondary | ICD-10-CM | POA: Diagnosis not present

## 2023-03-12 DIAGNOSIS — Z992 Dependence on renal dialysis: Secondary | ICD-10-CM | POA: Diagnosis not present

## 2023-03-12 DIAGNOSIS — D689 Coagulation defect, unspecified: Secondary | ICD-10-CM | POA: Diagnosis not present

## 2023-03-12 DIAGNOSIS — Z5181 Encounter for therapeutic drug level monitoring: Secondary | ICD-10-CM | POA: Diagnosis not present

## 2023-03-12 DIAGNOSIS — N186 End stage renal disease: Secondary | ICD-10-CM | POA: Diagnosis not present

## 2023-03-12 DIAGNOSIS — N2581 Secondary hyperparathyroidism of renal origin: Secondary | ICD-10-CM | POA: Diagnosis not present

## 2023-03-14 DIAGNOSIS — T8610 Unspecified complication of kidney transplant: Secondary | ICD-10-CM | POA: Diagnosis not present

## 2023-03-14 DIAGNOSIS — Z992 Dependence on renal dialysis: Secondary | ICD-10-CM | POA: Diagnosis not present

## 2023-03-14 DIAGNOSIS — N186 End stage renal disease: Secondary | ICD-10-CM | POA: Diagnosis not present

## 2023-03-15 DIAGNOSIS — N2581 Secondary hyperparathyroidism of renal origin: Secondary | ICD-10-CM | POA: Diagnosis not present

## 2023-03-15 DIAGNOSIS — N186 End stage renal disease: Secondary | ICD-10-CM | POA: Diagnosis not present

## 2023-03-15 DIAGNOSIS — Z5181 Encounter for therapeutic drug level monitoring: Secondary | ICD-10-CM | POA: Diagnosis not present

## 2023-03-15 DIAGNOSIS — L299 Pruritus, unspecified: Secondary | ICD-10-CM | POA: Diagnosis not present

## 2023-03-15 DIAGNOSIS — D631 Anemia in chronic kidney disease: Secondary | ICD-10-CM | POA: Diagnosis not present

## 2023-03-15 DIAGNOSIS — D689 Coagulation defect, unspecified: Secondary | ICD-10-CM | POA: Diagnosis not present

## 2023-03-15 DIAGNOSIS — Z992 Dependence on renal dialysis: Secondary | ICD-10-CM | POA: Diagnosis not present

## 2023-03-17 DIAGNOSIS — N186 End stage renal disease: Secondary | ICD-10-CM | POA: Diagnosis not present

## 2023-03-17 DIAGNOSIS — Z5181 Encounter for therapeutic drug level monitoring: Secondary | ICD-10-CM | POA: Diagnosis not present

## 2023-03-17 DIAGNOSIS — L299 Pruritus, unspecified: Secondary | ICD-10-CM | POA: Diagnosis not present

## 2023-03-17 DIAGNOSIS — Z992 Dependence on renal dialysis: Secondary | ICD-10-CM | POA: Diagnosis not present

## 2023-03-17 DIAGNOSIS — D689 Coagulation defect, unspecified: Secondary | ICD-10-CM | POA: Diagnosis not present

## 2023-03-17 DIAGNOSIS — N2581 Secondary hyperparathyroidism of renal origin: Secondary | ICD-10-CM | POA: Diagnosis not present

## 2023-03-17 DIAGNOSIS — D631 Anemia in chronic kidney disease: Secondary | ICD-10-CM | POA: Diagnosis not present

## 2023-03-19 DIAGNOSIS — Z5181 Encounter for therapeutic drug level monitoring: Secondary | ICD-10-CM | POA: Diagnosis not present

## 2023-03-19 DIAGNOSIS — D689 Coagulation defect, unspecified: Secondary | ICD-10-CM | POA: Diagnosis not present

## 2023-03-19 DIAGNOSIS — L299 Pruritus, unspecified: Secondary | ICD-10-CM | POA: Diagnosis not present

## 2023-03-19 DIAGNOSIS — Z992 Dependence on renal dialysis: Secondary | ICD-10-CM | POA: Diagnosis not present

## 2023-03-19 DIAGNOSIS — N2581 Secondary hyperparathyroidism of renal origin: Secondary | ICD-10-CM | POA: Diagnosis not present

## 2023-03-19 DIAGNOSIS — D631 Anemia in chronic kidney disease: Secondary | ICD-10-CM | POA: Diagnosis not present

## 2023-03-19 DIAGNOSIS — N186 End stage renal disease: Secondary | ICD-10-CM | POA: Diagnosis not present

## 2023-03-22 DIAGNOSIS — N186 End stage renal disease: Secondary | ICD-10-CM | POA: Diagnosis not present

## 2023-03-22 DIAGNOSIS — Z5181 Encounter for therapeutic drug level monitoring: Secondary | ICD-10-CM | POA: Diagnosis not present

## 2023-03-22 DIAGNOSIS — D689 Coagulation defect, unspecified: Secondary | ICD-10-CM | POA: Diagnosis not present

## 2023-03-22 DIAGNOSIS — Z992 Dependence on renal dialysis: Secondary | ICD-10-CM | POA: Diagnosis not present

## 2023-03-22 DIAGNOSIS — D631 Anemia in chronic kidney disease: Secondary | ICD-10-CM | POA: Diagnosis not present

## 2023-03-22 DIAGNOSIS — N2581 Secondary hyperparathyroidism of renal origin: Secondary | ICD-10-CM | POA: Diagnosis not present

## 2023-03-22 DIAGNOSIS — L299 Pruritus, unspecified: Secondary | ICD-10-CM | POA: Diagnosis not present

## 2023-03-24 DIAGNOSIS — L299 Pruritus, unspecified: Secondary | ICD-10-CM | POA: Diagnosis not present

## 2023-03-24 DIAGNOSIS — Z992 Dependence on renal dialysis: Secondary | ICD-10-CM | POA: Diagnosis not present

## 2023-03-24 DIAGNOSIS — Z5181 Encounter for therapeutic drug level monitoring: Secondary | ICD-10-CM | POA: Diagnosis not present

## 2023-03-24 DIAGNOSIS — D689 Coagulation defect, unspecified: Secondary | ICD-10-CM | POA: Diagnosis not present

## 2023-03-24 DIAGNOSIS — D631 Anemia in chronic kidney disease: Secondary | ICD-10-CM | POA: Diagnosis not present

## 2023-03-24 DIAGNOSIS — N2581 Secondary hyperparathyroidism of renal origin: Secondary | ICD-10-CM | POA: Diagnosis not present

## 2023-03-24 DIAGNOSIS — N186 End stage renal disease: Secondary | ICD-10-CM | POA: Diagnosis not present

## 2023-03-26 DIAGNOSIS — N2581 Secondary hyperparathyroidism of renal origin: Secondary | ICD-10-CM | POA: Diagnosis not present

## 2023-03-26 DIAGNOSIS — D631 Anemia in chronic kidney disease: Secondary | ICD-10-CM | POA: Diagnosis not present

## 2023-03-26 DIAGNOSIS — Z5181 Encounter for therapeutic drug level monitoring: Secondary | ICD-10-CM | POA: Diagnosis not present

## 2023-03-26 DIAGNOSIS — N186 End stage renal disease: Secondary | ICD-10-CM | POA: Diagnosis not present

## 2023-03-26 DIAGNOSIS — L299 Pruritus, unspecified: Secondary | ICD-10-CM | POA: Diagnosis not present

## 2023-03-26 DIAGNOSIS — D689 Coagulation defect, unspecified: Secondary | ICD-10-CM | POA: Diagnosis not present

## 2023-03-26 DIAGNOSIS — Z992 Dependence on renal dialysis: Secondary | ICD-10-CM | POA: Diagnosis not present

## 2023-03-29 DIAGNOSIS — Z5181 Encounter for therapeutic drug level monitoring: Secondary | ICD-10-CM | POA: Diagnosis not present

## 2023-03-29 DIAGNOSIS — D631 Anemia in chronic kidney disease: Secondary | ICD-10-CM | POA: Diagnosis not present

## 2023-03-29 DIAGNOSIS — D689 Coagulation defect, unspecified: Secondary | ICD-10-CM | POA: Diagnosis not present

## 2023-03-29 DIAGNOSIS — N2581 Secondary hyperparathyroidism of renal origin: Secondary | ICD-10-CM | POA: Diagnosis not present

## 2023-03-29 DIAGNOSIS — L299 Pruritus, unspecified: Secondary | ICD-10-CM | POA: Diagnosis not present

## 2023-03-29 DIAGNOSIS — N186 End stage renal disease: Secondary | ICD-10-CM | POA: Diagnosis not present

## 2023-03-29 DIAGNOSIS — Z992 Dependence on renal dialysis: Secondary | ICD-10-CM | POA: Diagnosis not present

## 2023-03-31 DIAGNOSIS — D631 Anemia in chronic kidney disease: Secondary | ICD-10-CM | POA: Diagnosis not present

## 2023-03-31 DIAGNOSIS — N186 End stage renal disease: Secondary | ICD-10-CM | POA: Diagnosis not present

## 2023-03-31 DIAGNOSIS — N2581 Secondary hyperparathyroidism of renal origin: Secondary | ICD-10-CM | POA: Diagnosis not present

## 2023-03-31 DIAGNOSIS — Z5181 Encounter for therapeutic drug level monitoring: Secondary | ICD-10-CM | POA: Diagnosis not present

## 2023-03-31 DIAGNOSIS — L299 Pruritus, unspecified: Secondary | ICD-10-CM | POA: Diagnosis not present

## 2023-03-31 DIAGNOSIS — Z992 Dependence on renal dialysis: Secondary | ICD-10-CM | POA: Diagnosis not present

## 2023-03-31 DIAGNOSIS — D689 Coagulation defect, unspecified: Secondary | ICD-10-CM | POA: Diagnosis not present

## 2023-04-02 DIAGNOSIS — D689 Coagulation defect, unspecified: Secondary | ICD-10-CM | POA: Diagnosis not present

## 2023-04-02 DIAGNOSIS — Z5181 Encounter for therapeutic drug level monitoring: Secondary | ICD-10-CM | POA: Diagnosis not present

## 2023-04-02 DIAGNOSIS — D631 Anemia in chronic kidney disease: Secondary | ICD-10-CM | POA: Diagnosis not present

## 2023-04-02 DIAGNOSIS — L299 Pruritus, unspecified: Secondary | ICD-10-CM | POA: Diagnosis not present

## 2023-04-02 DIAGNOSIS — N186 End stage renal disease: Secondary | ICD-10-CM | POA: Diagnosis not present

## 2023-04-02 DIAGNOSIS — N2581 Secondary hyperparathyroidism of renal origin: Secondary | ICD-10-CM | POA: Diagnosis not present

## 2023-04-02 DIAGNOSIS — Z992 Dependence on renal dialysis: Secondary | ICD-10-CM | POA: Diagnosis not present

## 2023-04-05 DIAGNOSIS — D689 Coagulation defect, unspecified: Secondary | ICD-10-CM | POA: Diagnosis not present

## 2023-04-05 DIAGNOSIS — D631 Anemia in chronic kidney disease: Secondary | ICD-10-CM | POA: Diagnosis not present

## 2023-04-05 DIAGNOSIS — N186 End stage renal disease: Secondary | ICD-10-CM | POA: Diagnosis not present

## 2023-04-05 DIAGNOSIS — Z992 Dependence on renal dialysis: Secondary | ICD-10-CM | POA: Diagnosis not present

## 2023-04-05 DIAGNOSIS — Z5181 Encounter for therapeutic drug level monitoring: Secondary | ICD-10-CM | POA: Diagnosis not present

## 2023-04-05 DIAGNOSIS — L299 Pruritus, unspecified: Secondary | ICD-10-CM | POA: Diagnosis not present

## 2023-04-05 DIAGNOSIS — N2581 Secondary hyperparathyroidism of renal origin: Secondary | ICD-10-CM | POA: Diagnosis not present

## 2023-04-07 DIAGNOSIS — D689 Coagulation defect, unspecified: Secondary | ICD-10-CM | POA: Diagnosis not present

## 2023-04-07 DIAGNOSIS — N186 End stage renal disease: Secondary | ICD-10-CM | POA: Diagnosis not present

## 2023-04-07 DIAGNOSIS — Z992 Dependence on renal dialysis: Secondary | ICD-10-CM | POA: Diagnosis not present

## 2023-04-07 DIAGNOSIS — Z5181 Encounter for therapeutic drug level monitoring: Secondary | ICD-10-CM | POA: Diagnosis not present

## 2023-04-07 DIAGNOSIS — L299 Pruritus, unspecified: Secondary | ICD-10-CM | POA: Diagnosis not present

## 2023-04-07 DIAGNOSIS — N2581 Secondary hyperparathyroidism of renal origin: Secondary | ICD-10-CM | POA: Diagnosis not present

## 2023-04-07 DIAGNOSIS — D631 Anemia in chronic kidney disease: Secondary | ICD-10-CM | POA: Diagnosis not present

## 2023-04-09 DIAGNOSIS — D631 Anemia in chronic kidney disease: Secondary | ICD-10-CM | POA: Diagnosis not present

## 2023-04-09 DIAGNOSIS — Z992 Dependence on renal dialysis: Secondary | ICD-10-CM | POA: Diagnosis not present

## 2023-04-09 DIAGNOSIS — N2581 Secondary hyperparathyroidism of renal origin: Secondary | ICD-10-CM | POA: Diagnosis not present

## 2023-04-09 DIAGNOSIS — Z5181 Encounter for therapeutic drug level monitoring: Secondary | ICD-10-CM | POA: Diagnosis not present

## 2023-04-09 DIAGNOSIS — L299 Pruritus, unspecified: Secondary | ICD-10-CM | POA: Diagnosis not present

## 2023-04-09 DIAGNOSIS — N186 End stage renal disease: Secondary | ICD-10-CM | POA: Diagnosis not present

## 2023-04-09 DIAGNOSIS — D689 Coagulation defect, unspecified: Secondary | ICD-10-CM | POA: Diagnosis not present

## 2023-04-12 DIAGNOSIS — Z992 Dependence on renal dialysis: Secondary | ICD-10-CM | POA: Diagnosis not present

## 2023-04-12 DIAGNOSIS — Z5181 Encounter for therapeutic drug level monitoring: Secondary | ICD-10-CM | POA: Diagnosis not present

## 2023-04-12 DIAGNOSIS — D689 Coagulation defect, unspecified: Secondary | ICD-10-CM | POA: Diagnosis not present

## 2023-04-12 DIAGNOSIS — L299 Pruritus, unspecified: Secondary | ICD-10-CM | POA: Diagnosis not present

## 2023-04-12 DIAGNOSIS — N2581 Secondary hyperparathyroidism of renal origin: Secondary | ICD-10-CM | POA: Diagnosis not present

## 2023-04-12 DIAGNOSIS — N186 End stage renal disease: Secondary | ICD-10-CM | POA: Diagnosis not present

## 2023-04-12 DIAGNOSIS — D631 Anemia in chronic kidney disease: Secondary | ICD-10-CM | POA: Diagnosis not present

## 2023-04-13 DIAGNOSIS — Z992 Dependence on renal dialysis: Secondary | ICD-10-CM | POA: Diagnosis not present

## 2023-04-13 DIAGNOSIS — N186 End stage renal disease: Secondary | ICD-10-CM | POA: Diagnosis not present

## 2023-04-13 DIAGNOSIS — T8610 Unspecified complication of kidney transplant: Secondary | ICD-10-CM | POA: Diagnosis not present

## 2023-04-14 DIAGNOSIS — D631 Anemia in chronic kidney disease: Secondary | ICD-10-CM | POA: Diagnosis not present

## 2023-04-14 DIAGNOSIS — Z992 Dependence on renal dialysis: Secondary | ICD-10-CM | POA: Diagnosis not present

## 2023-04-14 DIAGNOSIS — N2581 Secondary hyperparathyroidism of renal origin: Secondary | ICD-10-CM | POA: Diagnosis not present

## 2023-04-14 DIAGNOSIS — D689 Coagulation defect, unspecified: Secondary | ICD-10-CM | POA: Diagnosis not present

## 2023-04-14 DIAGNOSIS — N186 End stage renal disease: Secondary | ICD-10-CM | POA: Diagnosis not present

## 2023-04-14 DIAGNOSIS — Z5181 Encounter for therapeutic drug level monitoring: Secondary | ICD-10-CM | POA: Diagnosis not present

## 2023-04-16 DIAGNOSIS — D689 Coagulation defect, unspecified: Secondary | ICD-10-CM | POA: Diagnosis not present

## 2023-04-16 DIAGNOSIS — D631 Anemia in chronic kidney disease: Secondary | ICD-10-CM | POA: Diagnosis not present

## 2023-04-16 DIAGNOSIS — Z992 Dependence on renal dialysis: Secondary | ICD-10-CM | POA: Diagnosis not present

## 2023-04-16 DIAGNOSIS — N2581 Secondary hyperparathyroidism of renal origin: Secondary | ICD-10-CM | POA: Diagnosis not present

## 2023-04-16 DIAGNOSIS — Z5181 Encounter for therapeutic drug level monitoring: Secondary | ICD-10-CM | POA: Diagnosis not present

## 2023-04-16 DIAGNOSIS — N186 End stage renal disease: Secondary | ICD-10-CM | POA: Diagnosis not present

## 2023-04-19 DIAGNOSIS — N186 End stage renal disease: Secondary | ICD-10-CM | POA: Diagnosis not present

## 2023-04-19 DIAGNOSIS — D631 Anemia in chronic kidney disease: Secondary | ICD-10-CM | POA: Diagnosis not present

## 2023-04-19 DIAGNOSIS — Z992 Dependence on renal dialysis: Secondary | ICD-10-CM | POA: Diagnosis not present

## 2023-04-19 DIAGNOSIS — D689 Coagulation defect, unspecified: Secondary | ICD-10-CM | POA: Diagnosis not present

## 2023-04-19 DIAGNOSIS — N2581 Secondary hyperparathyroidism of renal origin: Secondary | ICD-10-CM | POA: Diagnosis not present

## 2023-04-19 DIAGNOSIS — Z5181 Encounter for therapeutic drug level monitoring: Secondary | ICD-10-CM | POA: Diagnosis not present

## 2023-04-21 DIAGNOSIS — N2581 Secondary hyperparathyroidism of renal origin: Secondary | ICD-10-CM | POA: Diagnosis not present

## 2023-04-21 DIAGNOSIS — D631 Anemia in chronic kidney disease: Secondary | ICD-10-CM | POA: Diagnosis not present

## 2023-04-21 DIAGNOSIS — Z5181 Encounter for therapeutic drug level monitoring: Secondary | ICD-10-CM | POA: Diagnosis not present

## 2023-04-21 DIAGNOSIS — Z992 Dependence on renal dialysis: Secondary | ICD-10-CM | POA: Diagnosis not present

## 2023-04-21 DIAGNOSIS — D689 Coagulation defect, unspecified: Secondary | ICD-10-CM | POA: Diagnosis not present

## 2023-04-21 DIAGNOSIS — N186 End stage renal disease: Secondary | ICD-10-CM | POA: Diagnosis not present

## 2023-04-23 DIAGNOSIS — D631 Anemia in chronic kidney disease: Secondary | ICD-10-CM | POA: Diagnosis not present

## 2023-04-23 DIAGNOSIS — N2581 Secondary hyperparathyroidism of renal origin: Secondary | ICD-10-CM | POA: Diagnosis not present

## 2023-04-23 DIAGNOSIS — N186 End stage renal disease: Secondary | ICD-10-CM | POA: Diagnosis not present

## 2023-04-23 DIAGNOSIS — Z5181 Encounter for therapeutic drug level monitoring: Secondary | ICD-10-CM | POA: Diagnosis not present

## 2023-04-23 DIAGNOSIS — D689 Coagulation defect, unspecified: Secondary | ICD-10-CM | POA: Diagnosis not present

## 2023-04-23 DIAGNOSIS — Z992 Dependence on renal dialysis: Secondary | ICD-10-CM | POA: Diagnosis not present

## 2023-04-26 DIAGNOSIS — D631 Anemia in chronic kidney disease: Secondary | ICD-10-CM | POA: Diagnosis not present

## 2023-04-26 DIAGNOSIS — D689 Coagulation defect, unspecified: Secondary | ICD-10-CM | POA: Diagnosis not present

## 2023-04-26 DIAGNOSIS — Z5181 Encounter for therapeutic drug level monitoring: Secondary | ICD-10-CM | POA: Diagnosis not present

## 2023-04-26 DIAGNOSIS — N186 End stage renal disease: Secondary | ICD-10-CM | POA: Diagnosis not present

## 2023-04-26 DIAGNOSIS — Z992 Dependence on renal dialysis: Secondary | ICD-10-CM | POA: Diagnosis not present

## 2023-04-26 DIAGNOSIS — N2581 Secondary hyperparathyroidism of renal origin: Secondary | ICD-10-CM | POA: Diagnosis not present

## 2023-04-28 DIAGNOSIS — Z5181 Encounter for therapeutic drug level monitoring: Secondary | ICD-10-CM | POA: Diagnosis not present

## 2023-04-28 DIAGNOSIS — N2581 Secondary hyperparathyroidism of renal origin: Secondary | ICD-10-CM | POA: Diagnosis not present

## 2023-04-28 DIAGNOSIS — N186 End stage renal disease: Secondary | ICD-10-CM | POA: Diagnosis not present

## 2023-04-28 DIAGNOSIS — Z992 Dependence on renal dialysis: Secondary | ICD-10-CM | POA: Diagnosis not present

## 2023-04-28 DIAGNOSIS — D689 Coagulation defect, unspecified: Secondary | ICD-10-CM | POA: Diagnosis not present

## 2023-04-28 DIAGNOSIS — D631 Anemia in chronic kidney disease: Secondary | ICD-10-CM | POA: Diagnosis not present

## 2023-04-30 DIAGNOSIS — D689 Coagulation defect, unspecified: Secondary | ICD-10-CM | POA: Diagnosis not present

## 2023-04-30 DIAGNOSIS — D631 Anemia in chronic kidney disease: Secondary | ICD-10-CM | POA: Diagnosis not present

## 2023-04-30 DIAGNOSIS — Z992 Dependence on renal dialysis: Secondary | ICD-10-CM | POA: Diagnosis not present

## 2023-04-30 DIAGNOSIS — N186 End stage renal disease: Secondary | ICD-10-CM | POA: Diagnosis not present

## 2023-04-30 DIAGNOSIS — Z5181 Encounter for therapeutic drug level monitoring: Secondary | ICD-10-CM | POA: Diagnosis not present

## 2023-04-30 DIAGNOSIS — N2581 Secondary hyperparathyroidism of renal origin: Secondary | ICD-10-CM | POA: Diagnosis not present

## 2023-05-03 DIAGNOSIS — D689 Coagulation defect, unspecified: Secondary | ICD-10-CM | POA: Diagnosis not present

## 2023-05-03 DIAGNOSIS — D631 Anemia in chronic kidney disease: Secondary | ICD-10-CM | POA: Diagnosis not present

## 2023-05-03 DIAGNOSIS — N2581 Secondary hyperparathyroidism of renal origin: Secondary | ICD-10-CM | POA: Diagnosis not present

## 2023-05-03 DIAGNOSIS — Z992 Dependence on renal dialysis: Secondary | ICD-10-CM | POA: Diagnosis not present

## 2023-05-03 DIAGNOSIS — Z5181 Encounter for therapeutic drug level monitoring: Secondary | ICD-10-CM | POA: Diagnosis not present

## 2023-05-03 DIAGNOSIS — N186 End stage renal disease: Secondary | ICD-10-CM | POA: Diagnosis not present

## 2023-05-05 DIAGNOSIS — Z992 Dependence on renal dialysis: Secondary | ICD-10-CM | POA: Diagnosis not present

## 2023-05-05 DIAGNOSIS — D689 Coagulation defect, unspecified: Secondary | ICD-10-CM | POA: Diagnosis not present

## 2023-05-05 DIAGNOSIS — Z5181 Encounter for therapeutic drug level monitoring: Secondary | ICD-10-CM | POA: Diagnosis not present

## 2023-05-05 DIAGNOSIS — D631 Anemia in chronic kidney disease: Secondary | ICD-10-CM | POA: Diagnosis not present

## 2023-05-05 DIAGNOSIS — N2581 Secondary hyperparathyroidism of renal origin: Secondary | ICD-10-CM | POA: Diagnosis not present

## 2023-05-05 DIAGNOSIS — N186 End stage renal disease: Secondary | ICD-10-CM | POA: Diagnosis not present

## 2023-05-07 DIAGNOSIS — Z5181 Encounter for therapeutic drug level monitoring: Secondary | ICD-10-CM | POA: Diagnosis not present

## 2023-05-07 DIAGNOSIS — Z992 Dependence on renal dialysis: Secondary | ICD-10-CM | POA: Diagnosis not present

## 2023-05-07 DIAGNOSIS — N186 End stage renal disease: Secondary | ICD-10-CM | POA: Diagnosis not present

## 2023-05-07 DIAGNOSIS — D689 Coagulation defect, unspecified: Secondary | ICD-10-CM | POA: Diagnosis not present

## 2023-05-07 DIAGNOSIS — D631 Anemia in chronic kidney disease: Secondary | ICD-10-CM | POA: Diagnosis not present

## 2023-05-07 DIAGNOSIS — N2581 Secondary hyperparathyroidism of renal origin: Secondary | ICD-10-CM | POA: Diagnosis not present

## 2023-05-10 DIAGNOSIS — Z992 Dependence on renal dialysis: Secondary | ICD-10-CM | POA: Diagnosis not present

## 2023-05-10 DIAGNOSIS — D631 Anemia in chronic kidney disease: Secondary | ICD-10-CM | POA: Diagnosis not present

## 2023-05-10 DIAGNOSIS — Z5181 Encounter for therapeutic drug level monitoring: Secondary | ICD-10-CM | POA: Diagnosis not present

## 2023-05-10 DIAGNOSIS — N2581 Secondary hyperparathyroidism of renal origin: Secondary | ICD-10-CM | POA: Diagnosis not present

## 2023-05-10 DIAGNOSIS — N186 End stage renal disease: Secondary | ICD-10-CM | POA: Diagnosis not present

## 2023-05-10 DIAGNOSIS — D689 Coagulation defect, unspecified: Secondary | ICD-10-CM | POA: Diagnosis not present

## 2023-05-12 DIAGNOSIS — D631 Anemia in chronic kidney disease: Secondary | ICD-10-CM | POA: Diagnosis not present

## 2023-05-12 DIAGNOSIS — N186 End stage renal disease: Secondary | ICD-10-CM | POA: Diagnosis not present

## 2023-05-12 DIAGNOSIS — N2581 Secondary hyperparathyroidism of renal origin: Secondary | ICD-10-CM | POA: Diagnosis not present

## 2023-05-12 DIAGNOSIS — Z5181 Encounter for therapeutic drug level monitoring: Secondary | ICD-10-CM | POA: Diagnosis not present

## 2023-05-12 DIAGNOSIS — D689 Coagulation defect, unspecified: Secondary | ICD-10-CM | POA: Diagnosis not present

## 2023-05-12 DIAGNOSIS — Z992 Dependence on renal dialysis: Secondary | ICD-10-CM | POA: Diagnosis not present

## 2023-05-14 DIAGNOSIS — Z992 Dependence on renal dialysis: Secondary | ICD-10-CM | POA: Diagnosis not present

## 2023-05-14 DIAGNOSIS — T8610 Unspecified complication of kidney transplant: Secondary | ICD-10-CM | POA: Diagnosis not present

## 2023-05-14 DIAGNOSIS — N2581 Secondary hyperparathyroidism of renal origin: Secondary | ICD-10-CM | POA: Diagnosis not present

## 2023-05-14 DIAGNOSIS — D631 Anemia in chronic kidney disease: Secondary | ICD-10-CM | POA: Diagnosis not present

## 2023-05-14 DIAGNOSIS — N186 End stage renal disease: Secondary | ICD-10-CM | POA: Diagnosis not present

## 2023-05-14 DIAGNOSIS — D689 Coagulation defect, unspecified: Secondary | ICD-10-CM | POA: Diagnosis not present

## 2023-05-14 DIAGNOSIS — Z5181 Encounter for therapeutic drug level monitoring: Secondary | ICD-10-CM | POA: Diagnosis not present

## 2023-05-17 DIAGNOSIS — N2581 Secondary hyperparathyroidism of renal origin: Secondary | ICD-10-CM | POA: Diagnosis not present

## 2023-05-17 DIAGNOSIS — D689 Coagulation defect, unspecified: Secondary | ICD-10-CM | POA: Diagnosis not present

## 2023-05-17 DIAGNOSIS — Z5181 Encounter for therapeutic drug level monitoring: Secondary | ICD-10-CM | POA: Diagnosis not present

## 2023-05-17 DIAGNOSIS — D631 Anemia in chronic kidney disease: Secondary | ICD-10-CM | POA: Diagnosis not present

## 2023-05-17 DIAGNOSIS — N186 End stage renal disease: Secondary | ICD-10-CM | POA: Diagnosis not present

## 2023-05-17 DIAGNOSIS — Z992 Dependence on renal dialysis: Secondary | ICD-10-CM | POA: Diagnosis not present

## 2023-05-19 DIAGNOSIS — Z5181 Encounter for therapeutic drug level monitoring: Secondary | ICD-10-CM | POA: Diagnosis not present

## 2023-05-19 DIAGNOSIS — N2581 Secondary hyperparathyroidism of renal origin: Secondary | ICD-10-CM | POA: Diagnosis not present

## 2023-05-19 DIAGNOSIS — Z992 Dependence on renal dialysis: Secondary | ICD-10-CM | POA: Diagnosis not present

## 2023-05-19 DIAGNOSIS — N186 End stage renal disease: Secondary | ICD-10-CM | POA: Diagnosis not present

## 2023-05-19 DIAGNOSIS — D631 Anemia in chronic kidney disease: Secondary | ICD-10-CM | POA: Diagnosis not present

## 2023-05-19 DIAGNOSIS — D689 Coagulation defect, unspecified: Secondary | ICD-10-CM | POA: Diagnosis not present

## 2023-05-21 DIAGNOSIS — N2581 Secondary hyperparathyroidism of renal origin: Secondary | ICD-10-CM | POA: Diagnosis not present

## 2023-05-21 DIAGNOSIS — Z5181 Encounter for therapeutic drug level monitoring: Secondary | ICD-10-CM | POA: Diagnosis not present

## 2023-05-21 DIAGNOSIS — D689 Coagulation defect, unspecified: Secondary | ICD-10-CM | POA: Diagnosis not present

## 2023-05-21 DIAGNOSIS — D631 Anemia in chronic kidney disease: Secondary | ICD-10-CM | POA: Diagnosis not present

## 2023-05-21 DIAGNOSIS — N186 End stage renal disease: Secondary | ICD-10-CM | POA: Diagnosis not present

## 2023-05-21 DIAGNOSIS — Z992 Dependence on renal dialysis: Secondary | ICD-10-CM | POA: Diagnosis not present

## 2023-05-24 DIAGNOSIS — N2581 Secondary hyperparathyroidism of renal origin: Secondary | ICD-10-CM | POA: Diagnosis not present

## 2023-05-24 DIAGNOSIS — D689 Coagulation defect, unspecified: Secondary | ICD-10-CM | POA: Diagnosis not present

## 2023-05-24 DIAGNOSIS — Z992 Dependence on renal dialysis: Secondary | ICD-10-CM | POA: Diagnosis not present

## 2023-05-24 DIAGNOSIS — D631 Anemia in chronic kidney disease: Secondary | ICD-10-CM | POA: Diagnosis not present

## 2023-05-24 DIAGNOSIS — Z5181 Encounter for therapeutic drug level monitoring: Secondary | ICD-10-CM | POA: Diagnosis not present

## 2023-05-24 DIAGNOSIS — N186 End stage renal disease: Secondary | ICD-10-CM | POA: Diagnosis not present

## 2023-05-26 DIAGNOSIS — Z992 Dependence on renal dialysis: Secondary | ICD-10-CM | POA: Diagnosis not present

## 2023-05-26 DIAGNOSIS — D689 Coagulation defect, unspecified: Secondary | ICD-10-CM | POA: Diagnosis not present

## 2023-05-26 DIAGNOSIS — D631 Anemia in chronic kidney disease: Secondary | ICD-10-CM | POA: Diagnosis not present

## 2023-05-26 DIAGNOSIS — N2581 Secondary hyperparathyroidism of renal origin: Secondary | ICD-10-CM | POA: Diagnosis not present

## 2023-05-26 DIAGNOSIS — N186 End stage renal disease: Secondary | ICD-10-CM | POA: Diagnosis not present

## 2023-05-26 DIAGNOSIS — Z5181 Encounter for therapeutic drug level monitoring: Secondary | ICD-10-CM | POA: Diagnosis not present

## 2023-05-28 DIAGNOSIS — D631 Anemia in chronic kidney disease: Secondary | ICD-10-CM | POA: Diagnosis not present

## 2023-05-28 DIAGNOSIS — N186 End stage renal disease: Secondary | ICD-10-CM | POA: Diagnosis not present

## 2023-05-28 DIAGNOSIS — N2581 Secondary hyperparathyroidism of renal origin: Secondary | ICD-10-CM | POA: Diagnosis not present

## 2023-05-28 DIAGNOSIS — Z5181 Encounter for therapeutic drug level monitoring: Secondary | ICD-10-CM | POA: Diagnosis not present

## 2023-05-28 DIAGNOSIS — Z992 Dependence on renal dialysis: Secondary | ICD-10-CM | POA: Diagnosis not present

## 2023-05-28 DIAGNOSIS — D689 Coagulation defect, unspecified: Secondary | ICD-10-CM | POA: Diagnosis not present

## 2023-05-31 DIAGNOSIS — D689 Coagulation defect, unspecified: Secondary | ICD-10-CM | POA: Diagnosis not present

## 2023-05-31 DIAGNOSIS — N2581 Secondary hyperparathyroidism of renal origin: Secondary | ICD-10-CM | POA: Diagnosis not present

## 2023-05-31 DIAGNOSIS — D631 Anemia in chronic kidney disease: Secondary | ICD-10-CM | POA: Diagnosis not present

## 2023-05-31 DIAGNOSIS — Z5181 Encounter for therapeutic drug level monitoring: Secondary | ICD-10-CM | POA: Diagnosis not present

## 2023-05-31 DIAGNOSIS — Z992 Dependence on renal dialysis: Secondary | ICD-10-CM | POA: Diagnosis not present

## 2023-05-31 DIAGNOSIS — N186 End stage renal disease: Secondary | ICD-10-CM | POA: Diagnosis not present

## 2023-06-02 DIAGNOSIS — D631 Anemia in chronic kidney disease: Secondary | ICD-10-CM | POA: Diagnosis not present

## 2023-06-02 DIAGNOSIS — N2581 Secondary hyperparathyroidism of renal origin: Secondary | ICD-10-CM | POA: Diagnosis not present

## 2023-06-02 DIAGNOSIS — Z992 Dependence on renal dialysis: Secondary | ICD-10-CM | POA: Diagnosis not present

## 2023-06-02 DIAGNOSIS — Z5181 Encounter for therapeutic drug level monitoring: Secondary | ICD-10-CM | POA: Diagnosis not present

## 2023-06-02 DIAGNOSIS — N186 End stage renal disease: Secondary | ICD-10-CM | POA: Diagnosis not present

## 2023-06-02 DIAGNOSIS — D689 Coagulation defect, unspecified: Secondary | ICD-10-CM | POA: Diagnosis not present

## 2023-06-04 DIAGNOSIS — N186 End stage renal disease: Secondary | ICD-10-CM | POA: Diagnosis not present

## 2023-06-04 DIAGNOSIS — Z5181 Encounter for therapeutic drug level monitoring: Secondary | ICD-10-CM | POA: Diagnosis not present

## 2023-06-04 DIAGNOSIS — D689 Coagulation defect, unspecified: Secondary | ICD-10-CM | POA: Diagnosis not present

## 2023-06-04 DIAGNOSIS — D631 Anemia in chronic kidney disease: Secondary | ICD-10-CM | POA: Diagnosis not present

## 2023-06-04 DIAGNOSIS — N2581 Secondary hyperparathyroidism of renal origin: Secondary | ICD-10-CM | POA: Diagnosis not present

## 2023-06-04 DIAGNOSIS — Z992 Dependence on renal dialysis: Secondary | ICD-10-CM | POA: Diagnosis not present

## 2023-06-07 DIAGNOSIS — D689 Coagulation defect, unspecified: Secondary | ICD-10-CM | POA: Diagnosis not present

## 2023-06-07 DIAGNOSIS — D631 Anemia in chronic kidney disease: Secondary | ICD-10-CM | POA: Diagnosis not present

## 2023-06-07 DIAGNOSIS — Z5181 Encounter for therapeutic drug level monitoring: Secondary | ICD-10-CM | POA: Diagnosis not present

## 2023-06-07 DIAGNOSIS — Z992 Dependence on renal dialysis: Secondary | ICD-10-CM | POA: Diagnosis not present

## 2023-06-07 DIAGNOSIS — N186 End stage renal disease: Secondary | ICD-10-CM | POA: Diagnosis not present

## 2023-06-07 DIAGNOSIS — N2581 Secondary hyperparathyroidism of renal origin: Secondary | ICD-10-CM | POA: Diagnosis not present

## 2023-06-09 DIAGNOSIS — Z5181 Encounter for therapeutic drug level monitoring: Secondary | ICD-10-CM | POA: Diagnosis not present

## 2023-06-09 DIAGNOSIS — N186 End stage renal disease: Secondary | ICD-10-CM | POA: Diagnosis not present

## 2023-06-09 DIAGNOSIS — D631 Anemia in chronic kidney disease: Secondary | ICD-10-CM | POA: Diagnosis not present

## 2023-06-09 DIAGNOSIS — D689 Coagulation defect, unspecified: Secondary | ICD-10-CM | POA: Diagnosis not present

## 2023-06-09 DIAGNOSIS — Z992 Dependence on renal dialysis: Secondary | ICD-10-CM | POA: Diagnosis not present

## 2023-06-09 DIAGNOSIS — N2581 Secondary hyperparathyroidism of renal origin: Secondary | ICD-10-CM | POA: Diagnosis not present

## 2023-06-10 DIAGNOSIS — T8612 Kidney transplant failure: Secondary | ICD-10-CM | POA: Diagnosis not present

## 2023-06-11 DIAGNOSIS — N2581 Secondary hyperparathyroidism of renal origin: Secondary | ICD-10-CM | POA: Diagnosis not present

## 2023-06-11 DIAGNOSIS — D631 Anemia in chronic kidney disease: Secondary | ICD-10-CM | POA: Diagnosis not present

## 2023-06-11 DIAGNOSIS — D689 Coagulation defect, unspecified: Secondary | ICD-10-CM | POA: Diagnosis not present

## 2023-06-11 DIAGNOSIS — N186 End stage renal disease: Secondary | ICD-10-CM | POA: Diagnosis not present

## 2023-06-11 DIAGNOSIS — Z992 Dependence on renal dialysis: Secondary | ICD-10-CM | POA: Diagnosis not present

## 2023-06-11 DIAGNOSIS — Z5181 Encounter for therapeutic drug level monitoring: Secondary | ICD-10-CM | POA: Diagnosis not present

## 2023-06-13 DIAGNOSIS — N186 End stage renal disease: Secondary | ICD-10-CM | POA: Diagnosis not present

## 2023-06-13 DIAGNOSIS — Z992 Dependence on renal dialysis: Secondary | ICD-10-CM | POA: Diagnosis not present

## 2023-06-13 DIAGNOSIS — T8610 Unspecified complication of kidney transplant: Secondary | ICD-10-CM | POA: Diagnosis not present

## 2023-06-14 DIAGNOSIS — Z992 Dependence on renal dialysis: Secondary | ICD-10-CM | POA: Diagnosis not present

## 2023-06-14 DIAGNOSIS — N2581 Secondary hyperparathyroidism of renal origin: Secondary | ICD-10-CM | POA: Diagnosis not present

## 2023-06-14 DIAGNOSIS — R519 Headache, unspecified: Secondary | ICD-10-CM | POA: Diagnosis not present

## 2023-06-14 DIAGNOSIS — D631 Anemia in chronic kidney disease: Secondary | ICD-10-CM | POA: Diagnosis not present

## 2023-06-14 DIAGNOSIS — T8249XD Other complication of vascular dialysis catheter, subsequent encounter: Secondary | ICD-10-CM | POA: Diagnosis not present

## 2023-06-14 DIAGNOSIS — N186 End stage renal disease: Secondary | ICD-10-CM | POA: Diagnosis not present

## 2023-06-14 DIAGNOSIS — Z5181 Encounter for therapeutic drug level monitoring: Secondary | ICD-10-CM | POA: Diagnosis not present

## 2023-06-14 DIAGNOSIS — D689 Coagulation defect, unspecified: Secondary | ICD-10-CM | POA: Diagnosis not present

## 2023-06-16 DIAGNOSIS — D689 Coagulation defect, unspecified: Secondary | ICD-10-CM | POA: Diagnosis not present

## 2023-06-16 DIAGNOSIS — R519 Headache, unspecified: Secondary | ICD-10-CM | POA: Diagnosis not present

## 2023-06-16 DIAGNOSIS — D631 Anemia in chronic kidney disease: Secondary | ICD-10-CM | POA: Diagnosis not present

## 2023-06-16 DIAGNOSIS — Z992 Dependence on renal dialysis: Secondary | ICD-10-CM | POA: Diagnosis not present

## 2023-06-16 DIAGNOSIS — N186 End stage renal disease: Secondary | ICD-10-CM | POA: Diagnosis not present

## 2023-06-16 DIAGNOSIS — N2581 Secondary hyperparathyroidism of renal origin: Secondary | ICD-10-CM | POA: Diagnosis not present

## 2023-06-16 DIAGNOSIS — T8249XD Other complication of vascular dialysis catheter, subsequent encounter: Secondary | ICD-10-CM | POA: Diagnosis not present

## 2023-06-16 DIAGNOSIS — Z5181 Encounter for therapeutic drug level monitoring: Secondary | ICD-10-CM | POA: Diagnosis not present

## 2023-06-18 DIAGNOSIS — D689 Coagulation defect, unspecified: Secondary | ICD-10-CM | POA: Diagnosis not present

## 2023-06-18 DIAGNOSIS — Z5181 Encounter for therapeutic drug level monitoring: Secondary | ICD-10-CM | POA: Diagnosis not present

## 2023-06-18 DIAGNOSIS — T8249XD Other complication of vascular dialysis catheter, subsequent encounter: Secondary | ICD-10-CM | POA: Diagnosis not present

## 2023-06-18 DIAGNOSIS — D631 Anemia in chronic kidney disease: Secondary | ICD-10-CM | POA: Diagnosis not present

## 2023-06-18 DIAGNOSIS — N186 End stage renal disease: Secondary | ICD-10-CM | POA: Diagnosis not present

## 2023-06-18 DIAGNOSIS — R519 Headache, unspecified: Secondary | ICD-10-CM | POA: Diagnosis not present

## 2023-06-18 DIAGNOSIS — Z992 Dependence on renal dialysis: Secondary | ICD-10-CM | POA: Diagnosis not present

## 2023-06-18 DIAGNOSIS — N2581 Secondary hyperparathyroidism of renal origin: Secondary | ICD-10-CM | POA: Diagnosis not present

## 2023-06-21 DIAGNOSIS — N2581 Secondary hyperparathyroidism of renal origin: Secondary | ICD-10-CM | POA: Diagnosis not present

## 2023-06-21 DIAGNOSIS — T8249XD Other complication of vascular dialysis catheter, subsequent encounter: Secondary | ICD-10-CM | POA: Diagnosis not present

## 2023-06-21 DIAGNOSIS — N186 End stage renal disease: Secondary | ICD-10-CM | POA: Diagnosis not present

## 2023-06-21 DIAGNOSIS — D689 Coagulation defect, unspecified: Secondary | ICD-10-CM | POA: Diagnosis not present

## 2023-06-21 DIAGNOSIS — Z5181 Encounter for therapeutic drug level monitoring: Secondary | ICD-10-CM | POA: Diagnosis not present

## 2023-06-21 DIAGNOSIS — R519 Headache, unspecified: Secondary | ICD-10-CM | POA: Diagnosis not present

## 2023-06-21 DIAGNOSIS — Z992 Dependence on renal dialysis: Secondary | ICD-10-CM | POA: Diagnosis not present

## 2023-06-21 DIAGNOSIS — D631 Anemia in chronic kidney disease: Secondary | ICD-10-CM | POA: Diagnosis not present

## 2023-06-23 DIAGNOSIS — N186 End stage renal disease: Secondary | ICD-10-CM | POA: Diagnosis not present

## 2023-06-23 DIAGNOSIS — Z992 Dependence on renal dialysis: Secondary | ICD-10-CM | POA: Diagnosis not present

## 2023-06-23 DIAGNOSIS — D689 Coagulation defect, unspecified: Secondary | ICD-10-CM | POA: Diagnosis not present

## 2023-06-23 DIAGNOSIS — N2581 Secondary hyperparathyroidism of renal origin: Secondary | ICD-10-CM | POA: Diagnosis not present

## 2023-06-23 DIAGNOSIS — T8249XD Other complication of vascular dialysis catheter, subsequent encounter: Secondary | ICD-10-CM | POA: Diagnosis not present

## 2023-06-23 DIAGNOSIS — D631 Anemia in chronic kidney disease: Secondary | ICD-10-CM | POA: Diagnosis not present

## 2023-06-23 DIAGNOSIS — Z5181 Encounter for therapeutic drug level monitoring: Secondary | ICD-10-CM | POA: Diagnosis not present

## 2023-06-23 DIAGNOSIS — R519 Headache, unspecified: Secondary | ICD-10-CM | POA: Diagnosis not present

## 2023-06-25 DIAGNOSIS — Z992 Dependence on renal dialysis: Secondary | ICD-10-CM | POA: Diagnosis not present

## 2023-06-25 DIAGNOSIS — D689 Coagulation defect, unspecified: Secondary | ICD-10-CM | POA: Diagnosis not present

## 2023-06-25 DIAGNOSIS — T8249XD Other complication of vascular dialysis catheter, subsequent encounter: Secondary | ICD-10-CM | POA: Diagnosis not present

## 2023-06-25 DIAGNOSIS — R519 Headache, unspecified: Secondary | ICD-10-CM | POA: Diagnosis not present

## 2023-06-25 DIAGNOSIS — N2581 Secondary hyperparathyroidism of renal origin: Secondary | ICD-10-CM | POA: Diagnosis not present

## 2023-06-25 DIAGNOSIS — N186 End stage renal disease: Secondary | ICD-10-CM | POA: Diagnosis not present

## 2023-06-25 DIAGNOSIS — D631 Anemia in chronic kidney disease: Secondary | ICD-10-CM | POA: Diagnosis not present

## 2023-06-25 DIAGNOSIS — Z5181 Encounter for therapeutic drug level monitoring: Secondary | ICD-10-CM | POA: Diagnosis not present

## 2023-06-28 DIAGNOSIS — Z992 Dependence on renal dialysis: Secondary | ICD-10-CM | POA: Diagnosis not present

## 2023-06-28 DIAGNOSIS — D689 Coagulation defect, unspecified: Secondary | ICD-10-CM | POA: Diagnosis not present

## 2023-06-28 DIAGNOSIS — D631 Anemia in chronic kidney disease: Secondary | ICD-10-CM | POA: Diagnosis not present

## 2023-06-28 DIAGNOSIS — N2581 Secondary hyperparathyroidism of renal origin: Secondary | ICD-10-CM | POA: Diagnosis not present

## 2023-06-28 DIAGNOSIS — N186 End stage renal disease: Secondary | ICD-10-CM | POA: Diagnosis not present

## 2023-06-28 DIAGNOSIS — R519 Headache, unspecified: Secondary | ICD-10-CM | POA: Diagnosis not present

## 2023-06-28 DIAGNOSIS — Z5181 Encounter for therapeutic drug level monitoring: Secondary | ICD-10-CM | POA: Diagnosis not present

## 2023-06-28 DIAGNOSIS — T8249XD Other complication of vascular dialysis catheter, subsequent encounter: Secondary | ICD-10-CM | POA: Diagnosis not present

## 2023-06-30 DIAGNOSIS — T8249XD Other complication of vascular dialysis catheter, subsequent encounter: Secondary | ICD-10-CM | POA: Diagnosis not present

## 2023-06-30 DIAGNOSIS — Z5181 Encounter for therapeutic drug level monitoring: Secondary | ICD-10-CM | POA: Diagnosis not present

## 2023-06-30 DIAGNOSIS — N2581 Secondary hyperparathyroidism of renal origin: Secondary | ICD-10-CM | POA: Diagnosis not present

## 2023-06-30 DIAGNOSIS — D689 Coagulation defect, unspecified: Secondary | ICD-10-CM | POA: Diagnosis not present

## 2023-06-30 DIAGNOSIS — N186 End stage renal disease: Secondary | ICD-10-CM | POA: Diagnosis not present

## 2023-06-30 DIAGNOSIS — D631 Anemia in chronic kidney disease: Secondary | ICD-10-CM | POA: Diagnosis not present

## 2023-06-30 DIAGNOSIS — R519 Headache, unspecified: Secondary | ICD-10-CM | POA: Diagnosis not present

## 2023-06-30 DIAGNOSIS — Z992 Dependence on renal dialysis: Secondary | ICD-10-CM | POA: Diagnosis not present

## 2023-07-02 DIAGNOSIS — D689 Coagulation defect, unspecified: Secondary | ICD-10-CM | POA: Diagnosis not present

## 2023-07-02 DIAGNOSIS — Z5181 Encounter for therapeutic drug level monitoring: Secondary | ICD-10-CM | POA: Diagnosis not present

## 2023-07-02 DIAGNOSIS — R519 Headache, unspecified: Secondary | ICD-10-CM | POA: Diagnosis not present

## 2023-07-02 DIAGNOSIS — T8249XD Other complication of vascular dialysis catheter, subsequent encounter: Secondary | ICD-10-CM | POA: Diagnosis not present

## 2023-07-02 DIAGNOSIS — D631 Anemia in chronic kidney disease: Secondary | ICD-10-CM | POA: Diagnosis not present

## 2023-07-02 DIAGNOSIS — N2581 Secondary hyperparathyroidism of renal origin: Secondary | ICD-10-CM | POA: Diagnosis not present

## 2023-07-02 DIAGNOSIS — Z992 Dependence on renal dialysis: Secondary | ICD-10-CM | POA: Diagnosis not present

## 2023-07-02 DIAGNOSIS — N186 End stage renal disease: Secondary | ICD-10-CM | POA: Diagnosis not present

## 2023-07-05 DIAGNOSIS — D689 Coagulation defect, unspecified: Secondary | ICD-10-CM | POA: Diagnosis not present

## 2023-07-05 DIAGNOSIS — T8249XD Other complication of vascular dialysis catheter, subsequent encounter: Secondary | ICD-10-CM | POA: Diagnosis not present

## 2023-07-05 DIAGNOSIS — Z992 Dependence on renal dialysis: Secondary | ICD-10-CM | POA: Diagnosis not present

## 2023-07-05 DIAGNOSIS — Z5181 Encounter for therapeutic drug level monitoring: Secondary | ICD-10-CM | POA: Diagnosis not present

## 2023-07-05 DIAGNOSIS — R519 Headache, unspecified: Secondary | ICD-10-CM | POA: Diagnosis not present

## 2023-07-05 DIAGNOSIS — N2581 Secondary hyperparathyroidism of renal origin: Secondary | ICD-10-CM | POA: Diagnosis not present

## 2023-07-05 DIAGNOSIS — N186 End stage renal disease: Secondary | ICD-10-CM | POA: Diagnosis not present

## 2023-07-05 DIAGNOSIS — D631 Anemia in chronic kidney disease: Secondary | ICD-10-CM | POA: Diagnosis not present

## 2023-07-09 DIAGNOSIS — Z5181 Encounter for therapeutic drug level monitoring: Secondary | ICD-10-CM | POA: Diagnosis not present

## 2023-07-09 DIAGNOSIS — T82868A Thrombosis of vascular prosthetic devices, implants and grafts, initial encounter: Secondary | ICD-10-CM | POA: Diagnosis not present

## 2023-07-09 DIAGNOSIS — D631 Anemia in chronic kidney disease: Secondary | ICD-10-CM | POA: Diagnosis not present

## 2023-07-09 DIAGNOSIS — N186 End stage renal disease: Secondary | ICD-10-CM | POA: Diagnosis not present

## 2023-07-09 DIAGNOSIS — D689 Coagulation defect, unspecified: Secondary | ICD-10-CM | POA: Diagnosis not present

## 2023-07-09 DIAGNOSIS — N2581 Secondary hyperparathyroidism of renal origin: Secondary | ICD-10-CM | POA: Diagnosis not present

## 2023-07-09 DIAGNOSIS — T8249XD Other complication of vascular dialysis catheter, subsequent encounter: Secondary | ICD-10-CM | POA: Diagnosis not present

## 2023-07-09 DIAGNOSIS — R519 Headache, unspecified: Secondary | ICD-10-CM | POA: Diagnosis not present

## 2023-07-09 DIAGNOSIS — Z992 Dependence on renal dialysis: Secondary | ICD-10-CM | POA: Diagnosis not present

## 2023-07-12 DIAGNOSIS — D631 Anemia in chronic kidney disease: Secondary | ICD-10-CM | POA: Diagnosis not present

## 2023-07-12 DIAGNOSIS — R519 Headache, unspecified: Secondary | ICD-10-CM | POA: Diagnosis not present

## 2023-07-12 DIAGNOSIS — N186 End stage renal disease: Secondary | ICD-10-CM | POA: Diagnosis not present

## 2023-07-12 DIAGNOSIS — N2581 Secondary hyperparathyroidism of renal origin: Secondary | ICD-10-CM | POA: Diagnosis not present

## 2023-07-12 DIAGNOSIS — T8249XD Other complication of vascular dialysis catheter, subsequent encounter: Secondary | ICD-10-CM | POA: Diagnosis not present

## 2023-07-12 DIAGNOSIS — Z992 Dependence on renal dialysis: Secondary | ICD-10-CM | POA: Diagnosis not present

## 2023-07-12 DIAGNOSIS — Z5181 Encounter for therapeutic drug level monitoring: Secondary | ICD-10-CM | POA: Diagnosis not present

## 2023-07-12 DIAGNOSIS — D689 Coagulation defect, unspecified: Secondary | ICD-10-CM | POA: Diagnosis not present

## 2023-07-14 DIAGNOSIS — N2581 Secondary hyperparathyroidism of renal origin: Secondary | ICD-10-CM | POA: Diagnosis not present

## 2023-07-14 DIAGNOSIS — T8610 Unspecified complication of kidney transplant: Secondary | ICD-10-CM | POA: Diagnosis not present

## 2023-07-14 DIAGNOSIS — T8249XD Other complication of vascular dialysis catheter, subsequent encounter: Secondary | ICD-10-CM | POA: Diagnosis not present

## 2023-07-14 DIAGNOSIS — D689 Coagulation defect, unspecified: Secondary | ICD-10-CM | POA: Diagnosis not present

## 2023-07-14 DIAGNOSIS — Z992 Dependence on renal dialysis: Secondary | ICD-10-CM | POA: Diagnosis not present

## 2023-07-14 DIAGNOSIS — Z5181 Encounter for therapeutic drug level monitoring: Secondary | ICD-10-CM | POA: Diagnosis not present

## 2023-07-14 DIAGNOSIS — N186 End stage renal disease: Secondary | ICD-10-CM | POA: Diagnosis not present

## 2023-07-14 DIAGNOSIS — R519 Headache, unspecified: Secondary | ICD-10-CM | POA: Diagnosis not present

## 2023-07-14 DIAGNOSIS — D631 Anemia in chronic kidney disease: Secondary | ICD-10-CM | POA: Diagnosis not present

## 2023-07-16 DIAGNOSIS — T8249XD Other complication of vascular dialysis catheter, subsequent encounter: Secondary | ICD-10-CM | POA: Diagnosis not present

## 2023-07-16 DIAGNOSIS — N2581 Secondary hyperparathyroidism of renal origin: Secondary | ICD-10-CM | POA: Diagnosis not present

## 2023-07-16 DIAGNOSIS — Z992 Dependence on renal dialysis: Secondary | ICD-10-CM | POA: Diagnosis not present

## 2023-07-16 DIAGNOSIS — D689 Coagulation defect, unspecified: Secondary | ICD-10-CM | POA: Diagnosis not present

## 2023-07-16 DIAGNOSIS — D631 Anemia in chronic kidney disease: Secondary | ICD-10-CM | POA: Diagnosis not present

## 2023-07-16 DIAGNOSIS — R519 Headache, unspecified: Secondary | ICD-10-CM | POA: Diagnosis not present

## 2023-07-16 DIAGNOSIS — Z5181 Encounter for therapeutic drug level monitoring: Secondary | ICD-10-CM | POA: Diagnosis not present

## 2023-07-16 DIAGNOSIS — N186 End stage renal disease: Secondary | ICD-10-CM | POA: Diagnosis not present

## 2023-07-19 DIAGNOSIS — R519 Headache, unspecified: Secondary | ICD-10-CM | POA: Diagnosis not present

## 2023-07-19 DIAGNOSIS — D689 Coagulation defect, unspecified: Secondary | ICD-10-CM | POA: Diagnosis not present

## 2023-07-19 DIAGNOSIS — D631 Anemia in chronic kidney disease: Secondary | ICD-10-CM | POA: Diagnosis not present

## 2023-07-19 DIAGNOSIS — N2581 Secondary hyperparathyroidism of renal origin: Secondary | ICD-10-CM | POA: Diagnosis not present

## 2023-07-19 DIAGNOSIS — Z992 Dependence on renal dialysis: Secondary | ICD-10-CM | POA: Diagnosis not present

## 2023-07-19 DIAGNOSIS — Z5181 Encounter for therapeutic drug level monitoring: Secondary | ICD-10-CM | POA: Diagnosis not present

## 2023-07-19 DIAGNOSIS — T8249XD Other complication of vascular dialysis catheter, subsequent encounter: Secondary | ICD-10-CM | POA: Diagnosis not present

## 2023-07-19 DIAGNOSIS — N186 End stage renal disease: Secondary | ICD-10-CM | POA: Diagnosis not present

## 2023-07-20 ENCOUNTER — Ambulatory Visit (HOSPITAL_COMMUNITY)
Admission: RE | Admit: 2023-07-20 | Discharge: 2023-07-20 | Disposition: A | Discharge status: Home / Self Care | Payer: Medicare Other | Source: Ambulatory Visit | Attending: Vascular Surgery | Admitting: Vascular Surgery

## 2023-07-20 ENCOUNTER — Other Ambulatory Visit: Payer: Self-pay | Admitting: *Deleted

## 2023-07-20 DIAGNOSIS — Z992 Dependence on renal dialysis: Secondary | ICD-10-CM

## 2023-07-20 DIAGNOSIS — N186 End stage renal disease: Secondary | ICD-10-CM

## 2023-07-20 DIAGNOSIS — E785 Hyperlipidemia, unspecified: Secondary | ICD-10-CM | POA: Diagnosis not present

## 2023-07-21 DIAGNOSIS — D689 Coagulation defect, unspecified: Secondary | ICD-10-CM | POA: Diagnosis not present

## 2023-07-21 DIAGNOSIS — N2581 Secondary hyperparathyroidism of renal origin: Secondary | ICD-10-CM | POA: Diagnosis not present

## 2023-07-21 DIAGNOSIS — T8249XD Other complication of vascular dialysis catheter, subsequent encounter: Secondary | ICD-10-CM | POA: Diagnosis not present

## 2023-07-21 DIAGNOSIS — Z992 Dependence on renal dialysis: Secondary | ICD-10-CM | POA: Diagnosis not present

## 2023-07-21 DIAGNOSIS — Z5181 Encounter for therapeutic drug level monitoring: Secondary | ICD-10-CM | POA: Diagnosis not present

## 2023-07-21 DIAGNOSIS — N186 End stage renal disease: Secondary | ICD-10-CM | POA: Diagnosis not present

## 2023-07-21 DIAGNOSIS — R519 Headache, unspecified: Secondary | ICD-10-CM | POA: Diagnosis not present

## 2023-07-21 DIAGNOSIS — D631 Anemia in chronic kidney disease: Secondary | ICD-10-CM | POA: Diagnosis not present

## 2023-07-23 DIAGNOSIS — Z5181 Encounter for therapeutic drug level monitoring: Secondary | ICD-10-CM | POA: Diagnosis not present

## 2023-07-23 DIAGNOSIS — Z992 Dependence on renal dialysis: Secondary | ICD-10-CM | POA: Diagnosis not present

## 2023-07-23 DIAGNOSIS — N186 End stage renal disease: Secondary | ICD-10-CM | POA: Diagnosis not present

## 2023-07-23 DIAGNOSIS — R519 Headache, unspecified: Secondary | ICD-10-CM | POA: Diagnosis not present

## 2023-07-23 DIAGNOSIS — N2581 Secondary hyperparathyroidism of renal origin: Secondary | ICD-10-CM | POA: Diagnosis not present

## 2023-07-23 DIAGNOSIS — D689 Coagulation defect, unspecified: Secondary | ICD-10-CM | POA: Diagnosis not present

## 2023-07-23 DIAGNOSIS — D631 Anemia in chronic kidney disease: Secondary | ICD-10-CM | POA: Diagnosis not present

## 2023-07-23 DIAGNOSIS — T8249XD Other complication of vascular dialysis catheter, subsequent encounter: Secondary | ICD-10-CM | POA: Diagnosis not present

## 2023-07-26 DIAGNOSIS — N2581 Secondary hyperparathyroidism of renal origin: Secondary | ICD-10-CM | POA: Diagnosis not present

## 2023-07-26 DIAGNOSIS — D689 Coagulation defect, unspecified: Secondary | ICD-10-CM | POA: Diagnosis not present

## 2023-07-26 DIAGNOSIS — N186 End stage renal disease: Secondary | ICD-10-CM | POA: Diagnosis not present

## 2023-07-26 DIAGNOSIS — Z5181 Encounter for therapeutic drug level monitoring: Secondary | ICD-10-CM | POA: Diagnosis not present

## 2023-07-26 DIAGNOSIS — D631 Anemia in chronic kidney disease: Secondary | ICD-10-CM | POA: Diagnosis not present

## 2023-07-26 DIAGNOSIS — R519 Headache, unspecified: Secondary | ICD-10-CM | POA: Diagnosis not present

## 2023-07-26 DIAGNOSIS — Z992 Dependence on renal dialysis: Secondary | ICD-10-CM | POA: Diagnosis not present

## 2023-07-26 DIAGNOSIS — T8249XD Other complication of vascular dialysis catheter, subsequent encounter: Secondary | ICD-10-CM | POA: Diagnosis not present

## 2023-07-28 DIAGNOSIS — D631 Anemia in chronic kidney disease: Secondary | ICD-10-CM | POA: Diagnosis not present

## 2023-07-28 DIAGNOSIS — Z992 Dependence on renal dialysis: Secondary | ICD-10-CM | POA: Diagnosis not present

## 2023-07-28 DIAGNOSIS — R519 Headache, unspecified: Secondary | ICD-10-CM | POA: Diagnosis not present

## 2023-07-28 DIAGNOSIS — Z5181 Encounter for therapeutic drug level monitoring: Secondary | ICD-10-CM | POA: Diagnosis not present

## 2023-07-28 DIAGNOSIS — D689 Coagulation defect, unspecified: Secondary | ICD-10-CM | POA: Diagnosis not present

## 2023-07-28 DIAGNOSIS — T8249XD Other complication of vascular dialysis catheter, subsequent encounter: Secondary | ICD-10-CM | POA: Diagnosis not present

## 2023-07-28 DIAGNOSIS — N2581 Secondary hyperparathyroidism of renal origin: Secondary | ICD-10-CM | POA: Diagnosis not present

## 2023-07-28 DIAGNOSIS — N186 End stage renal disease: Secondary | ICD-10-CM | POA: Diagnosis not present

## 2023-07-30 DIAGNOSIS — T8249XD Other complication of vascular dialysis catheter, subsequent encounter: Secondary | ICD-10-CM | POA: Diagnosis not present

## 2023-07-30 DIAGNOSIS — D631 Anemia in chronic kidney disease: Secondary | ICD-10-CM | POA: Diagnosis not present

## 2023-07-30 DIAGNOSIS — Z992 Dependence on renal dialysis: Secondary | ICD-10-CM | POA: Diagnosis not present

## 2023-07-30 DIAGNOSIS — N186 End stage renal disease: Secondary | ICD-10-CM | POA: Diagnosis not present

## 2023-07-30 DIAGNOSIS — N2581 Secondary hyperparathyroidism of renal origin: Secondary | ICD-10-CM | POA: Diagnosis not present

## 2023-07-30 DIAGNOSIS — R519 Headache, unspecified: Secondary | ICD-10-CM | POA: Diagnosis not present

## 2023-07-30 DIAGNOSIS — D689 Coagulation defect, unspecified: Secondary | ICD-10-CM | POA: Diagnosis not present

## 2023-07-30 DIAGNOSIS — Z5181 Encounter for therapeutic drug level monitoring: Secondary | ICD-10-CM | POA: Diagnosis not present

## 2023-08-02 DIAGNOSIS — T8249XD Other complication of vascular dialysis catheter, subsequent encounter: Secondary | ICD-10-CM | POA: Diagnosis not present

## 2023-08-02 DIAGNOSIS — D631 Anemia in chronic kidney disease: Secondary | ICD-10-CM | POA: Diagnosis not present

## 2023-08-02 DIAGNOSIS — Z5181 Encounter for therapeutic drug level monitoring: Secondary | ICD-10-CM | POA: Diagnosis not present

## 2023-08-02 DIAGNOSIS — R519 Headache, unspecified: Secondary | ICD-10-CM | POA: Diagnosis not present

## 2023-08-02 DIAGNOSIS — D689 Coagulation defect, unspecified: Secondary | ICD-10-CM | POA: Diagnosis not present

## 2023-08-02 DIAGNOSIS — N186 End stage renal disease: Secondary | ICD-10-CM | POA: Diagnosis not present

## 2023-08-02 DIAGNOSIS — N2581 Secondary hyperparathyroidism of renal origin: Secondary | ICD-10-CM | POA: Diagnosis not present

## 2023-08-02 DIAGNOSIS — Z992 Dependence on renal dialysis: Secondary | ICD-10-CM | POA: Diagnosis not present

## 2023-08-04 DIAGNOSIS — Z992 Dependence on renal dialysis: Secondary | ICD-10-CM | POA: Diagnosis not present

## 2023-08-04 DIAGNOSIS — N186 End stage renal disease: Secondary | ICD-10-CM | POA: Diagnosis not present

## 2023-08-04 DIAGNOSIS — N2581 Secondary hyperparathyroidism of renal origin: Secondary | ICD-10-CM | POA: Diagnosis not present

## 2023-08-04 DIAGNOSIS — Z5181 Encounter for therapeutic drug level monitoring: Secondary | ICD-10-CM | POA: Diagnosis not present

## 2023-08-04 DIAGNOSIS — R519 Headache, unspecified: Secondary | ICD-10-CM | POA: Diagnosis not present

## 2023-08-04 DIAGNOSIS — D689 Coagulation defect, unspecified: Secondary | ICD-10-CM | POA: Diagnosis not present

## 2023-08-04 DIAGNOSIS — D631 Anemia in chronic kidney disease: Secondary | ICD-10-CM | POA: Diagnosis not present

## 2023-08-04 DIAGNOSIS — T8249XD Other complication of vascular dialysis catheter, subsequent encounter: Secondary | ICD-10-CM | POA: Diagnosis not present

## 2023-08-05 ENCOUNTER — Encounter: Payer: Self-pay | Admitting: Vascular Surgery

## 2023-08-05 ENCOUNTER — Ambulatory Visit (INDEPENDENT_AMBULATORY_CARE_PROVIDER_SITE_OTHER): Payer: Medicare Other | Admitting: Vascular Surgery

## 2023-08-05 VITALS — BP 154/92 | HR 74 | Temp 98.8°F | Resp 20 | Ht 70.0 in | Wt 242.0 lb

## 2023-08-05 DIAGNOSIS — N186 End stage renal disease: Secondary | ICD-10-CM | POA: Diagnosis not present

## 2023-08-05 DIAGNOSIS — Z992 Dependence on renal dialysis: Secondary | ICD-10-CM | POA: Diagnosis not present

## 2023-08-05 NOTE — Progress Notes (Signed)
REASON FOR VISIT:   To evaluate for new access.  MEDICAL ISSUES:   END-STAGE RENAL DISEASE: This patient feels strongly about having access in the left arm.  His options there would be a basilic vein transposition versus an AV graft.  I have explained that if we do a basilic vein transposition this can sometimes be done in 1 stage.  If the vein is marginal then we often times do this in 2 stages to make sure the fistula matures adequately first.  If the vein is not adequate he would require placement of an upper arm graft.  We have discussed the indications for the surgery and the potential complications.  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  I do not think these should be done at the same time.  He dialyzes on Monday Wednesdays and Fridays and we will schedule his surgery for a Tuesday.  HPI:   Tim Baker is a pleasant 47 y.o. male who presents for evaluation for new access.  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  The upper arm cephalic vein is also thrombosed.  He had a tunneled dialysis catheter placed in the right IJ about 3 weeks ago and is using this for dialysis.  He was sent for evaluation for new access.  He is right-handed.  He feels fairly strongly about continuing to use the left arm for access.  He denies any recent uremic symptoms.  Past Medical History:  Diagnosis Date   Allergy    Anemia    ESRD (end stage renal disease) on dialysis [facility])    "MWF; [facility]" (12/31/2015)   GERD (gastroesophageal reflux disease)    GI bleed    H/O kidney transplant    Heart murmur    "slight one"   Hematochezia    History of blood transfusion 12/31/2015   "this is my 1st" (12/31/2015)   Hyperkalemia     Hypertension    Renal disorder     Family History  Problem Relation Age of Onset   Hypertension Father    Glaucoma Father    Breast cancer Mother     SOCIAL HISTORY: Social History   Tobacco Use   Smoking status: Never   Smokeless tobacco: Never  Substance Use Topics   Alcohol use: Yes    Comment: one beer a month    No Known Allergies  Current Outpatient Medications  Medication Sig Dispense Refill   amLODipine (NORVASC) 10 MG tablet Take 10 mg by mouth at bedtime.      amoxicillin-clavulanate (AUGMENTIN) 875-125 MG tablet Take 1 tablet by mouth every 12 (twelve) hours. 14 tablet 0   AURYXIA 1 GM 210 MG(Fe) tablet Take 630 mg by mouth with breakfast, with lunch, and with evening meal.     carvedilol (COREG) 25 MG tablet Take 25 mg by mouth in the morning and at bedtime.     cinacalcet (SENSIPAR) 60 MG tablet Take 60 mg by mouth daily.     labetalol (NORMODYNE) 300 MG tablet Take 600 mg by mouth 2 (two) times daily.     Lanthanum Carbonate 1000 MG PACK Take 2,000 mg by mouth 3 (three) times daily with meals.  Multiple Vitamins-Minerals (RENAPLEX) TABS Take 1 tablet by mouth daily.     omeprazole (PRILOSEC) 20 MG capsule Take 20 mg by mouth daily.     No current facility-administered medications for this visit.    REVIEW OF SYSTEMS:  [X]  denotes positive finding, [ ]  denotes negative finding Cardiac  Comments:  Chest pain or chest pressure:    Shortness of breath upon exertion:    Short of breath when lying flat:    Irregular heart rhythm:        Vascular    Pain in calf, thigh, or hip brought on by ambulation:    Pain in feet at night that wakes you up from your sleep:     Blood clot in your veins:    Leg swelling:         Pulmonary    Oxygen at home:    Productive cough:     Wheezing:         Neurologic    Sudden weakness in arms or legs:     Sudden numbness in arms or legs:     Sudden onset of difficulty speaking or slurred speech:    Temporary loss  of vision in one eye:     Problems with dizziness:         Gastrointestinal    Blood in stool:     Vomited blood:         Genitourinary    Burning when urinating:     Blood in urine:        Psychiatric    Major depression:         Hematologic    Bleeding problems:    Problems with blood clotting too easily:        Skin    Rashes or ulcers:        Constitutional    Fever or chills:     PHYSICAL EXAM:   Vitals:   08/05/23 1618  BP: (!) 154/92  Pulse: 74  Resp: 20  Temp: 98.8 F (37.1 C)  SpO2: 94%  Weight: 242 lb (109.8 kg)  Height: 5\' 10"  (1.778 m)  GENERAL: The patient is a well-nourished male, in no acute distress. The vital signs are documented above. CARDIAC: There is a regular rate and rhythm.  VASCULAR: He has palpable radial pulses bilaterally. He has a massively aneurysmal left forearm fistula which is clotted.  His upper arm cephalic vein is also thrombosed. PULMONARY: There is good air exchange bilaterally without wheezing or rales. MUSCULOSKELETAL: There are no major deformities or cyanosis. NEUROLOGIC: No focal weakness or paresthesias are detected. SKIN: There are no ulcers or rashes noted. PSYCHIATRIC: The patient has a normal affect.  DATA:    UPPER EXTREMITY ARTERIAL DUPLEX: I have reviewed the upper extremity arterial duplex scan that was done on 07/20/2023.  There was no obstruction visualized in the right upper extremity.  The brachial artery measured 5 mm in diameter with this study  UPPER EXTREMITY VEIN MAP: I reviewed his upper extremity vein map that was also done on 07/20/2023.  The basilic vein in the upper arm on the left has diameters ranging from 6.6-8.1 mm.  The forearm and upper arm cephalic vein are thrombosed.  Waverly Ferrari [facility] [phone]

## 2023-08-06 DIAGNOSIS — N2581 Secondary hyperparathyroidism of renal origin: Secondary | ICD-10-CM | POA: Diagnosis not present

## 2023-08-06 DIAGNOSIS — Z992 Dependence on renal dialysis: Secondary | ICD-10-CM | POA: Diagnosis not present

## 2023-08-06 DIAGNOSIS — D631 Anemia in chronic kidney disease: Secondary | ICD-10-CM | POA: Diagnosis not present

## 2023-08-06 DIAGNOSIS — N186 End stage renal disease: Secondary | ICD-10-CM | POA: Diagnosis not present

## 2023-08-06 DIAGNOSIS — D689 Coagulation defect, unspecified: Secondary | ICD-10-CM | POA: Diagnosis not present

## 2023-08-06 DIAGNOSIS — T8249XD Other complication of vascular dialysis catheter, subsequent encounter: Secondary | ICD-10-CM | POA: Diagnosis not present

## 2023-08-06 DIAGNOSIS — R519 Headache, unspecified: Secondary | ICD-10-CM | POA: Diagnosis not present

## 2023-08-06 DIAGNOSIS — Z5181 Encounter for therapeutic drug level monitoring: Secondary | ICD-10-CM | POA: Diagnosis not present

## 2023-08-09 ENCOUNTER — Telehealth: Payer: Self-pay

## 2023-08-09 DIAGNOSIS — D689 Coagulation defect, unspecified: Secondary | ICD-10-CM | POA: Diagnosis not present

## 2023-08-09 DIAGNOSIS — T8249XD Other complication of vascular dialysis catheter, subsequent encounter: Secondary | ICD-10-CM | POA: Diagnosis not present

## 2023-08-09 DIAGNOSIS — Z992 Dependence on renal dialysis: Secondary | ICD-10-CM | POA: Diagnosis not present

## 2023-08-09 DIAGNOSIS — D631 Anemia in chronic kidney disease: Secondary | ICD-10-CM | POA: Diagnosis not present

## 2023-08-09 DIAGNOSIS — N2581 Secondary hyperparathyroidism of renal origin: Secondary | ICD-10-CM | POA: Diagnosis not present

## 2023-08-09 DIAGNOSIS — R519 Headache, unspecified: Secondary | ICD-10-CM | POA: Diagnosis not present

## 2023-08-09 DIAGNOSIS — Z5181 Encounter for therapeutic drug level monitoring: Secondary | ICD-10-CM | POA: Diagnosis not present

## 2023-08-09 DIAGNOSIS — N186 End stage renal disease: Secondary | ICD-10-CM | POA: Diagnosis not present

## 2023-08-09 NOTE — Telephone Encounter (Signed)
Attempted to reach patient to schedule surgery. Left VM for patient to return call.  

## 2023-08-10 ENCOUNTER — Telehealth: Payer: Self-pay

## 2023-08-10 ENCOUNTER — Other Ambulatory Visit: Payer: Self-pay

## 2023-08-10 DIAGNOSIS — N186 End stage renal disease: Secondary | ICD-10-CM

## 2023-08-10 NOTE — Telephone Encounter (Signed)
Clarification order per Dr. Edilia Bo to schedule pt for a left arm BVT vs AVG.   Spoke with patient and scheduled surgery for 9/24. Instructions provided and he voiced understanding.

## 2023-08-11 DIAGNOSIS — Z992 Dependence on renal dialysis: Secondary | ICD-10-CM | POA: Diagnosis not present

## 2023-08-11 DIAGNOSIS — N2581 Secondary hyperparathyroidism of renal origin: Secondary | ICD-10-CM | POA: Diagnosis not present

## 2023-08-11 DIAGNOSIS — D689 Coagulation defect, unspecified: Secondary | ICD-10-CM | POA: Diagnosis not present

## 2023-08-11 DIAGNOSIS — N186 End stage renal disease: Secondary | ICD-10-CM | POA: Diagnosis not present

## 2023-08-11 DIAGNOSIS — T8249XD Other complication of vascular dialysis catheter, subsequent encounter: Secondary | ICD-10-CM | POA: Diagnosis not present

## 2023-08-11 DIAGNOSIS — Z5181 Encounter for therapeutic drug level monitoring: Secondary | ICD-10-CM | POA: Diagnosis not present

## 2023-08-11 DIAGNOSIS — R519 Headache, unspecified: Secondary | ICD-10-CM | POA: Diagnosis not present

## 2023-08-11 DIAGNOSIS — D631 Anemia in chronic kidney disease: Secondary | ICD-10-CM | POA: Diagnosis not present

## 2023-08-13 DIAGNOSIS — T8249XD Other complication of vascular dialysis catheter, subsequent encounter: Secondary | ICD-10-CM | POA: Diagnosis not present

## 2023-08-13 DIAGNOSIS — R519 Headache, unspecified: Secondary | ICD-10-CM | POA: Diagnosis not present

## 2023-08-13 DIAGNOSIS — Z5181 Encounter for therapeutic drug level monitoring: Secondary | ICD-10-CM | POA: Diagnosis not present

## 2023-08-13 DIAGNOSIS — Z992 Dependence on renal dialysis: Secondary | ICD-10-CM | POA: Diagnosis not present

## 2023-08-13 DIAGNOSIS — D631 Anemia in chronic kidney disease: Secondary | ICD-10-CM | POA: Diagnosis not present

## 2023-08-13 DIAGNOSIS — D689 Coagulation defect, unspecified: Secondary | ICD-10-CM | POA: Diagnosis not present

## 2023-08-13 DIAGNOSIS — N186 End stage renal disease: Secondary | ICD-10-CM | POA: Diagnosis not present

## 2023-08-13 DIAGNOSIS — N2581 Secondary hyperparathyroidism of renal origin: Secondary | ICD-10-CM | POA: Diagnosis not present

## 2023-08-14 DIAGNOSIS — N186 End stage renal disease: Secondary | ICD-10-CM | POA: Diagnosis not present

## 2023-08-14 DIAGNOSIS — Z992 Dependence on renal dialysis: Secondary | ICD-10-CM | POA: Diagnosis not present

## 2023-08-14 DIAGNOSIS — T8610 Unspecified complication of kidney transplant: Secondary | ICD-10-CM | POA: Diagnosis not present

## 2023-08-16 DIAGNOSIS — N2581 Secondary hyperparathyroidism of renal origin: Secondary | ICD-10-CM | POA: Diagnosis not present

## 2023-08-16 DIAGNOSIS — D689 Coagulation defect, unspecified: Secondary | ICD-10-CM | POA: Diagnosis not present

## 2023-08-16 DIAGNOSIS — T8249XD Other complication of vascular dialysis catheter, subsequent encounter: Secondary | ICD-10-CM | POA: Diagnosis not present

## 2023-08-16 DIAGNOSIS — R519 Headache, unspecified: Secondary | ICD-10-CM | POA: Diagnosis not present

## 2023-08-16 DIAGNOSIS — Z992 Dependence on renal dialysis: Secondary | ICD-10-CM | POA: Diagnosis not present

## 2023-08-16 DIAGNOSIS — D631 Anemia in chronic kidney disease: Secondary | ICD-10-CM | POA: Diagnosis not present

## 2023-08-16 DIAGNOSIS — N186 End stage renal disease: Secondary | ICD-10-CM | POA: Diagnosis not present

## 2023-08-16 DIAGNOSIS — Z5181 Encounter for therapeutic drug level monitoring: Secondary | ICD-10-CM | POA: Diagnosis not present

## 2023-08-18 DIAGNOSIS — N2581 Secondary hyperparathyroidism of renal origin: Secondary | ICD-10-CM | POA: Diagnosis not present

## 2023-08-18 DIAGNOSIS — Z992 Dependence on renal dialysis: Secondary | ICD-10-CM | POA: Diagnosis not present

## 2023-08-18 DIAGNOSIS — N186 End stage renal disease: Secondary | ICD-10-CM | POA: Diagnosis not present

## 2023-08-18 DIAGNOSIS — T8249XD Other complication of vascular dialysis catheter, subsequent encounter: Secondary | ICD-10-CM | POA: Diagnosis not present

## 2023-08-18 DIAGNOSIS — D689 Coagulation defect, unspecified: Secondary | ICD-10-CM | POA: Diagnosis not present

## 2023-08-18 DIAGNOSIS — R519 Headache, unspecified: Secondary | ICD-10-CM | POA: Diagnosis not present

## 2023-08-18 DIAGNOSIS — Z5181 Encounter for therapeutic drug level monitoring: Secondary | ICD-10-CM | POA: Diagnosis not present

## 2023-08-18 DIAGNOSIS — D631 Anemia in chronic kidney disease: Secondary | ICD-10-CM | POA: Diagnosis not present

## 2023-08-19 DIAGNOSIS — N2581 Secondary hyperparathyroidism of renal origin: Secondary | ICD-10-CM | POA: Diagnosis not present

## 2023-08-19 DIAGNOSIS — T8249XD Other complication of vascular dialysis catheter, subsequent encounter: Secondary | ICD-10-CM | POA: Diagnosis not present

## 2023-08-19 DIAGNOSIS — D631 Anemia in chronic kidney disease: Secondary | ICD-10-CM | POA: Diagnosis not present

## 2023-08-19 DIAGNOSIS — D689 Coagulation defect, unspecified: Secondary | ICD-10-CM | POA: Diagnosis not present

## 2023-08-19 DIAGNOSIS — Z992 Dependence on renal dialysis: Secondary | ICD-10-CM | POA: Diagnosis not present

## 2023-08-19 DIAGNOSIS — Z5181 Encounter for therapeutic drug level monitoring: Secondary | ICD-10-CM | POA: Diagnosis not present

## 2023-08-19 DIAGNOSIS — R519 Headache, unspecified: Secondary | ICD-10-CM | POA: Diagnosis not present

## 2023-08-19 DIAGNOSIS — N186 End stage renal disease: Secondary | ICD-10-CM | POA: Diagnosis not present

## 2023-08-20 DIAGNOSIS — D631 Anemia in chronic kidney disease: Secondary | ICD-10-CM | POA: Diagnosis not present

## 2023-08-20 DIAGNOSIS — R519 Headache, unspecified: Secondary | ICD-10-CM | POA: Diagnosis not present

## 2023-08-20 DIAGNOSIS — N2581 Secondary hyperparathyroidism of renal origin: Secondary | ICD-10-CM | POA: Diagnosis not present

## 2023-08-20 DIAGNOSIS — D689 Coagulation defect, unspecified: Secondary | ICD-10-CM | POA: Diagnosis not present

## 2023-08-20 DIAGNOSIS — N186 End stage renal disease: Secondary | ICD-10-CM | POA: Diagnosis not present

## 2023-08-20 DIAGNOSIS — Z5181 Encounter for therapeutic drug level monitoring: Secondary | ICD-10-CM | POA: Diagnosis not present

## 2023-08-20 DIAGNOSIS — T8249XD Other complication of vascular dialysis catheter, subsequent encounter: Secondary | ICD-10-CM | POA: Diagnosis not present

## 2023-08-20 DIAGNOSIS — Z992 Dependence on renal dialysis: Secondary | ICD-10-CM | POA: Diagnosis not present

## 2023-08-23 DIAGNOSIS — D689 Coagulation defect, unspecified: Secondary | ICD-10-CM | POA: Diagnosis not present

## 2023-08-23 DIAGNOSIS — N2581 Secondary hyperparathyroidism of renal origin: Secondary | ICD-10-CM | POA: Diagnosis not present

## 2023-08-23 DIAGNOSIS — R519 Headache, unspecified: Secondary | ICD-10-CM | POA: Diagnosis not present

## 2023-08-23 DIAGNOSIS — Z992 Dependence on renal dialysis: Secondary | ICD-10-CM | POA: Diagnosis not present

## 2023-08-23 DIAGNOSIS — T8249XD Other complication of vascular dialysis catheter, subsequent encounter: Secondary | ICD-10-CM | POA: Diagnosis not present

## 2023-08-23 DIAGNOSIS — Z5181 Encounter for therapeutic drug level monitoring: Secondary | ICD-10-CM | POA: Diagnosis not present

## 2023-08-23 DIAGNOSIS — D631 Anemia in chronic kidney disease: Secondary | ICD-10-CM | POA: Diagnosis not present

## 2023-08-23 DIAGNOSIS — N186 End stage renal disease: Secondary | ICD-10-CM | POA: Diagnosis not present

## 2023-08-25 DIAGNOSIS — R519 Headache, unspecified: Secondary | ICD-10-CM | POA: Diagnosis not present

## 2023-08-25 DIAGNOSIS — Z5181 Encounter for therapeutic drug level monitoring: Secondary | ICD-10-CM | POA: Diagnosis not present

## 2023-08-25 DIAGNOSIS — T8249XD Other complication of vascular dialysis catheter, subsequent encounter: Secondary | ICD-10-CM | POA: Diagnosis not present

## 2023-08-25 DIAGNOSIS — Z992 Dependence on renal dialysis: Secondary | ICD-10-CM | POA: Diagnosis not present

## 2023-08-25 DIAGNOSIS — D689 Coagulation defect, unspecified: Secondary | ICD-10-CM | POA: Diagnosis not present

## 2023-08-25 DIAGNOSIS — N2581 Secondary hyperparathyroidism of renal origin: Secondary | ICD-10-CM | POA: Diagnosis not present

## 2023-08-25 DIAGNOSIS — D631 Anemia in chronic kidney disease: Secondary | ICD-10-CM | POA: Diagnosis not present

## 2023-08-25 DIAGNOSIS — N186 End stage renal disease: Secondary | ICD-10-CM | POA: Diagnosis not present

## 2023-08-27 DIAGNOSIS — N186 End stage renal disease: Secondary | ICD-10-CM | POA: Diagnosis not present

## 2023-08-27 DIAGNOSIS — D689 Coagulation defect, unspecified: Secondary | ICD-10-CM | POA: Diagnosis not present

## 2023-08-27 DIAGNOSIS — R519 Headache, unspecified: Secondary | ICD-10-CM | POA: Diagnosis not present

## 2023-08-27 DIAGNOSIS — Z5181 Encounter for therapeutic drug level monitoring: Secondary | ICD-10-CM | POA: Diagnosis not present

## 2023-08-27 DIAGNOSIS — T8249XD Other complication of vascular dialysis catheter, subsequent encounter: Secondary | ICD-10-CM | POA: Diagnosis not present

## 2023-08-27 DIAGNOSIS — D631 Anemia in chronic kidney disease: Secondary | ICD-10-CM | POA: Diagnosis not present

## 2023-08-27 DIAGNOSIS — Z992 Dependence on renal dialysis: Secondary | ICD-10-CM | POA: Diagnosis not present

## 2023-08-27 DIAGNOSIS — N2581 Secondary hyperparathyroidism of renal origin: Secondary | ICD-10-CM | POA: Diagnosis not present

## 2023-08-30 DIAGNOSIS — N2581 Secondary hyperparathyroidism of renal origin: Secondary | ICD-10-CM | POA: Diagnosis not present

## 2023-08-30 DIAGNOSIS — R519 Headache, unspecified: Secondary | ICD-10-CM | POA: Diagnosis not present

## 2023-08-30 DIAGNOSIS — D631 Anemia in chronic kidney disease: Secondary | ICD-10-CM | POA: Diagnosis not present

## 2023-08-30 DIAGNOSIS — Z5181 Encounter for therapeutic drug level monitoring: Secondary | ICD-10-CM | POA: Diagnosis not present

## 2023-08-30 DIAGNOSIS — T8249XD Other complication of vascular dialysis catheter, subsequent encounter: Secondary | ICD-10-CM | POA: Diagnosis not present

## 2023-08-30 DIAGNOSIS — D689 Coagulation defect, unspecified: Secondary | ICD-10-CM | POA: Diagnosis not present

## 2023-08-30 DIAGNOSIS — Z992 Dependence on renal dialysis: Secondary | ICD-10-CM | POA: Diagnosis not present

## 2023-08-30 DIAGNOSIS — N186 End stage renal disease: Secondary | ICD-10-CM | POA: Diagnosis not present

## 2023-09-01 DIAGNOSIS — D631 Anemia in chronic kidney disease: Secondary | ICD-10-CM | POA: Diagnosis not present

## 2023-09-01 DIAGNOSIS — T8249XD Other complication of vascular dialysis catheter, subsequent encounter: Secondary | ICD-10-CM | POA: Diagnosis not present

## 2023-09-01 DIAGNOSIS — Z5181 Encounter for therapeutic drug level monitoring: Secondary | ICD-10-CM | POA: Diagnosis not present

## 2023-09-01 DIAGNOSIS — D689 Coagulation defect, unspecified: Secondary | ICD-10-CM | POA: Diagnosis not present

## 2023-09-01 DIAGNOSIS — R519 Headache, unspecified: Secondary | ICD-10-CM | POA: Diagnosis not present

## 2023-09-01 DIAGNOSIS — N2581 Secondary hyperparathyroidism of renal origin: Secondary | ICD-10-CM | POA: Diagnosis not present

## 2023-09-01 DIAGNOSIS — Z992 Dependence on renal dialysis: Secondary | ICD-10-CM | POA: Diagnosis not present

## 2023-09-01 DIAGNOSIS — N186 End stage renal disease: Secondary | ICD-10-CM | POA: Diagnosis not present

## 2023-09-02 ENCOUNTER — Ambulatory Visit: Payer: Medicare Other | Admitting: Vascular Surgery

## 2023-09-03 DIAGNOSIS — T8249XD Other complication of vascular dialysis catheter, subsequent encounter: Secondary | ICD-10-CM | POA: Diagnosis not present

## 2023-09-03 DIAGNOSIS — D689 Coagulation defect, unspecified: Secondary | ICD-10-CM | POA: Diagnosis not present

## 2023-09-03 DIAGNOSIS — N2581 Secondary hyperparathyroidism of renal origin: Secondary | ICD-10-CM | POA: Diagnosis not present

## 2023-09-03 DIAGNOSIS — R519 Headache, unspecified: Secondary | ICD-10-CM | POA: Diagnosis not present

## 2023-09-03 DIAGNOSIS — N186 End stage renal disease: Secondary | ICD-10-CM | POA: Diagnosis not present

## 2023-09-03 DIAGNOSIS — D631 Anemia in chronic kidney disease: Secondary | ICD-10-CM | POA: Diagnosis not present

## 2023-09-03 DIAGNOSIS — Z5181 Encounter for therapeutic drug level monitoring: Secondary | ICD-10-CM | POA: Diagnosis not present

## 2023-09-03 DIAGNOSIS — Z992 Dependence on renal dialysis: Secondary | ICD-10-CM | POA: Diagnosis not present

## 2023-09-06 ENCOUNTER — Other Ambulatory Visit: Payer: Self-pay

## 2023-09-06 ENCOUNTER — Encounter (HOSPITAL_COMMUNITY): Payer: Self-pay | Admitting: Vascular Surgery

## 2023-09-06 DIAGNOSIS — N186 End stage renal disease: Secondary | ICD-10-CM | POA: Diagnosis not present

## 2023-09-06 DIAGNOSIS — D631 Anemia in chronic kidney disease: Secondary | ICD-10-CM | POA: Diagnosis not present

## 2023-09-06 DIAGNOSIS — D689 Coagulation defect, unspecified: Secondary | ICD-10-CM | POA: Diagnosis not present

## 2023-09-06 DIAGNOSIS — N2581 Secondary hyperparathyroidism of renal origin: Secondary | ICD-10-CM | POA: Diagnosis not present

## 2023-09-06 DIAGNOSIS — Z5181 Encounter for therapeutic drug level monitoring: Secondary | ICD-10-CM | POA: Diagnosis not present

## 2023-09-06 DIAGNOSIS — Z992 Dependence on renal dialysis: Secondary | ICD-10-CM | POA: Diagnosis not present

## 2023-09-06 DIAGNOSIS — T8249XD Other complication of vascular dialysis catheter, subsequent encounter: Secondary | ICD-10-CM | POA: Diagnosis not present

## 2023-09-06 DIAGNOSIS — R519 Headache, unspecified: Secondary | ICD-10-CM | POA: Diagnosis not present

## 2023-09-06 NOTE — Progress Notes (Signed)
Mr. Tim Baker denies chest pain or shortness of breath. Patient denies having any s/s of Covid in his household, also denies any known exposure to Covid. Mr. Tim Baker denies  any s/s of upper or lower respiratory infection in the past 8 weeks.  Mr. Tim Baker denies having a cardiology. Patient is seen at [facility], patient had a kidney transplant in 2013, the kidney failed in 2016

## 2023-09-07 ENCOUNTER — Ambulatory Visit (HOSPITAL_COMMUNITY)
Admission: RE | Admit: 2023-09-07 | Discharge: 2023-09-07 | Disposition: A | Discharge status: Home / Self Care | Payer: Medicare Other | Attending: Vascular Surgery | Admitting: Vascular Surgery

## 2023-09-07 ENCOUNTER — Ambulatory Visit (HOSPITAL_BASED_OUTPATIENT_CLINIC_OR_DEPARTMENT_OTHER): Payer: Medicare Other

## 2023-09-07 ENCOUNTER — Encounter (HOSPITAL_COMMUNITY): Payer: Self-pay | Admitting: Vascular Surgery

## 2023-09-07 ENCOUNTER — Encounter (HOSPITAL_COMMUNITY)
Admission: RE | Disposition: A | Discharge status: Home / Self Care | Payer: Self-pay | Source: Home / Self Care | Attending: Vascular Surgery

## 2023-09-07 ENCOUNTER — Other Ambulatory Visit: Payer: Self-pay

## 2023-09-07 ENCOUNTER — Ambulatory Visit (HOSPITAL_COMMUNITY): Payer: Medicare Other

## 2023-09-07 DIAGNOSIS — Z992 Dependence on renal dialysis: Secondary | ICD-10-CM | POA: Diagnosis not present

## 2023-09-07 DIAGNOSIS — N185 Chronic kidney disease, stage 5: Secondary | ICD-10-CM | POA: Diagnosis not present

## 2023-09-07 DIAGNOSIS — I120 Hypertensive chronic kidney disease with stage 5 chronic kidney disease or end stage renal disease: Secondary | ICD-10-CM | POA: Diagnosis not present

## 2023-09-07 DIAGNOSIS — N186 End stage renal disease: Secondary | ICD-10-CM

## 2023-09-07 DIAGNOSIS — Z940 Kidney transplant status: Secondary | ICD-10-CM | POA: Insufficient documentation

## 2023-09-07 DIAGNOSIS — K219 Gastro-esophageal reflux disease without esophagitis: Secondary | ICD-10-CM | POA: Diagnosis not present

## 2023-09-07 HISTORY — PX: BASCILIC VEIN TRANSPOSITION: SHX5742

## 2023-09-07 HISTORY — DX: Malignant (primary) neoplasm, unspecified: C80.1

## 2023-09-07 LAB — POCT I-STAT, CHEM 8
BUN: 48 mg/dL — ABNORMAL HIGH (ref 6–20)
Calcium, Ion: 0.89 mmol/L — CL (ref 1.15–1.40)
Chloride: 101 mmol/L (ref 98–111)
Creatinine, Ser: 13.4 mg/dL — ABNORMAL HIGH (ref 0.61–1.24)
Glucose, Bld: 97 mg/dL (ref 70–99)
HCT: 43 % (ref 39.0–52.0)
Hemoglobin: 14.6 g/dL (ref 13.0–17.0)
Potassium: 4.9 mmol/L (ref 3.5–5.1)
Sodium: 138 mmol/L (ref 135–145)
TCO2: 27 mmol/L (ref 22–32)

## 2023-09-07 SURGERY — TRANSPOSITION, VEIN, BASILIC
Anesthesia: General | Site: Arm Upper | Laterality: Left

## 2023-09-07 MED ORDER — LACTATED RINGERS IV SOLN: INTRAVENOUS | Status: DC

## 2023-09-07 MED ORDER — LIDOCAINE-EPINEPHRINE (PF) 1 %-1:200000 IJ SOLN
INTRAMUSCULAR | Status: AC
  Filled 2023-09-07: qty 30

## 2023-09-07 MED ORDER — CHLORHEXIDINE GLUCONATE 4 % EX SOLN: 60.0000 mL | Freq: Once | CUTANEOUS | Status: DC

## 2023-09-07 MED ORDER — DEXAMETHASONE SODIUM PHOSPHATE 10 MG/ML IJ SOLN
INTRAMUSCULAR | Status: DC | PRN
  Administered 2023-09-07: 4 mg via INTRAVENOUS

## 2023-09-07 MED ORDER — MIDAZOLAM HCL 2 MG/2ML IJ SOLN
INTRAMUSCULAR | Status: DC | PRN
  Administered 2023-09-07: 2 mg via INTRAVENOUS

## 2023-09-07 MED ORDER — LIDOCAINE 2% (20 MG/ML) 5 ML SYRINGE
INTRAMUSCULAR | Status: AC
  Filled 2023-09-07: qty 5

## 2023-09-07 MED ORDER — PROPOFOL 10 MG/ML IV BOLUS
INTRAVENOUS | Status: AC
  Filled 2023-09-07: qty 20

## 2023-09-07 MED ORDER — HEPARIN SODIUM (PORCINE) 1000 UNIT/ML IJ SOLN
INTRAMUSCULAR | Status: DC | PRN
Start: 2023-09-07 — End: 2023-09-07
  Administered 2023-09-07: 10000 [IU] via INTRAVENOUS

## 2023-09-07 MED ORDER — PROTAMINE SULFATE 10 MG/ML IV SOLN
INTRAVENOUS | Status: DC | PRN
  Administered 2023-09-07: 50 mg via INTRAVENOUS

## 2023-09-07 MED ORDER — ONDANSETRON HCL 4 MG/2ML IJ SOLN
INTRAMUSCULAR | Status: AC
  Filled 2023-09-07: qty 2

## 2023-09-07 MED ORDER — FENTANYL CITRATE (PF) 250 MCG/5ML IJ SOLN
INTRAMUSCULAR | Status: DC | PRN
  Administered 2023-09-07: 25 ug via INTRAVENOUS
  Administered 2023-09-07 (×3): 50 ug via INTRAVENOUS

## 2023-09-07 MED ORDER — ONDANSETRON HCL 4 MG/2ML IJ SOLN
INTRAMUSCULAR | Status: DC | PRN
  Administered 2023-09-07: 4 mg via INTRAVENOUS

## 2023-09-07 MED ORDER — LIDOCAINE 2% (20 MG/ML) 5 ML SYRINGE
INTRAMUSCULAR | Status: DC | PRN
  Administered 2023-09-07: 100 mg via INTRAVENOUS

## 2023-09-07 MED ORDER — HEPARIN 6000 UNIT IRRIGATION SOLUTION
Status: DC | PRN
  Administered 2023-09-07: 1

## 2023-09-07 MED ORDER — SODIUM CHLORIDE 0.9 % IV SOLN: INTRAVENOUS | Status: DC

## 2023-09-07 MED ORDER — CEFAZOLIN SODIUM-DEXTROSE 2-4 GM/100ML-% IV SOLN
2.0000 g | INTRAVENOUS | Status: AC
  Administered 2023-09-07: 2 g via INTRAVENOUS
  Filled 2023-09-07: qty 100

## 2023-09-07 MED ORDER — LIDOCAINE-EPINEPHRINE (PF) 1 %-1:200000 IJ SOLN
INTRAMUSCULAR | Status: DC | PRN
  Administered 2023-09-07: 30 mL

## 2023-09-07 MED ORDER — EPHEDRINE 5 MG/ML INJ
INTRAVENOUS | Status: AC
  Filled 2023-09-07: qty 5

## 2023-09-07 MED ORDER — 0.9 % SODIUM CHLORIDE (POUR BTL) OPTIME
TOPICAL | Status: DC | PRN
  Administered 2023-09-07: 1000 mL

## 2023-09-07 MED ORDER — OXYCODONE-ACETAMINOPHEN 5-325 MG PO TABS
1.0000 | ORAL_TABLET | Freq: Four times a day (QID) | ORAL | 0 refills | Status: DC | PRN
Start: 2023-09-07 — End: 2024-01-06

## 2023-09-07 MED ORDER — GLYCOPYRROLATE 0.2 MG/ML IJ SOLN
INTRAMUSCULAR | Status: DC | PRN
Start: 2023-09-07 — End: 2023-09-07
  Administered 2023-09-07: .2 mg via INTRAVENOUS

## 2023-09-07 MED ORDER — LIDOCAINE HCL (PF) 1 % IJ SOLN
INTRAMUSCULAR | Status: AC
  Filled 2023-09-07: qty 30

## 2023-09-07 MED ORDER — CHLORHEXIDINE GLUCONATE 0.12 % MT SOLN
15.0000 mL | Freq: Once | OROMUCOSAL | Status: AC
  Administered 2023-09-07: 15 mL via OROMUCOSAL
  Filled 2023-09-07: qty 15

## 2023-09-07 MED ORDER — EPHEDRINE SULFATE-NACL 50-0.9 MG/10ML-% IV SOSY
PREFILLED_SYRINGE | INTRAVENOUS | Status: DC | PRN
  Administered 2023-09-07 (×5): 10 mg via INTRAVENOUS

## 2023-09-07 MED ORDER — HEPARIN 6000 UNIT IRRIGATION SOLUTION
Status: AC
  Filled 2023-09-07: qty 500

## 2023-09-07 MED ORDER — PROPOFOL 10 MG/ML IV BOLUS
INTRAVENOUS | Status: DC | PRN
Start: 2023-09-07 — End: 2023-09-07
  Administered 2023-09-07: 200 mg via INTRAVENOUS
  Administered 2023-09-07: 100 ug/kg/min via INTRAVENOUS
  Administered 2023-09-07 (×2): 50 mg via INTRAVENOUS

## 2023-09-07 MED ORDER — FENTANYL CITRATE (PF) 250 MCG/5ML IJ SOLN
INTRAMUSCULAR | Status: AC
  Filled 2023-09-07: qty 5

## 2023-09-07 MED ORDER — ORAL CARE MOUTH RINSE: 15.0000 mL | Freq: Once | OROMUCOSAL | Status: AC

## 2023-09-07 MED ORDER — MIDAZOLAM HCL 2 MG/2ML IJ SOLN
INTRAMUSCULAR | Status: AC
  Filled 2023-09-07: qty 2

## 2023-09-07 MED ORDER — PHENYLEPHRINE 80 MCG/ML (10ML) SYRINGE FOR IV PUSH (FOR BLOOD PRESSURE SUPPORT)
PREFILLED_SYRINGE | INTRAVENOUS | Status: DC | PRN
  Administered 2023-09-07 (×3): 80 ug via INTRAVENOUS

## 2023-09-07 MED ORDER — PROTAMINE SULFATE 10 MG/ML IV SOLN
INTRAVENOUS | Status: AC
  Filled 2023-09-07: qty 5

## 2023-09-07 MED ORDER — LACTATED RINGERS IV SOLN
INTRAVENOUS | Status: DC | PRN
Start: 2023-09-07 — End: 2023-09-07

## 2023-09-07 MED ORDER — DEXAMETHASONE SODIUM PHOSPHATE 10 MG/ML IJ SOLN
INTRAMUSCULAR | Status: AC
  Filled 2023-09-07: qty 1

## 2023-09-07 MED ORDER — GLYCOPYRROLATE PF 0.2 MG/ML IJ SOSY
PREFILLED_SYRINGE | INTRAMUSCULAR | Status: AC
  Filled 2023-09-07: qty 1

## 2023-09-07 SURGICAL SUPPLY — 41 items
ADH SKN CLS APL DERMABOND .7 (GAUZE/BANDAGES/DRESSINGS) ×1
ARMBAND PINK RESTRICT EXTREMIT (MISCELLANEOUS) ×1 IMPLANT
BAG COUNTER SPONGE SURGICOUNT (BAG) ×1 IMPLANT
BAG SPNG CNTER NS LX DISP (BAG) ×1
BNDG CMPR MD 5X2 ELC HKLP STRL (GAUZE/BANDAGES/DRESSINGS) ×1
BNDG ELASTIC 2 VLCR STRL LF (GAUZE/BANDAGES/DRESSINGS) IMPLANT
CANISTER SUCT 3000ML PPV (MISCELLANEOUS) ×1 IMPLANT
CANNULA VESSEL 3MM 2 BLNT TIP (CANNULA) ×1 IMPLANT
CLIP TI MEDIUM 24 (CLIP) ×1 IMPLANT
CLIP TI WIDE RED SMALL 24 (CLIP) ×1 IMPLANT
COVER PROBE W GEL 5X96 (DRAPES) IMPLANT
DERMABOND ADVANCED .7 DNX12 (GAUZE/BANDAGES/DRESSINGS) ×1 IMPLANT
ELECT REM PT RETURN 9FT ADLT (ELECTROSURGICAL) ×1
ELECTRODE REM PT RTRN 9FT ADLT (ELECTROSURGICAL) ×1 IMPLANT
GLOVE BIO SURGEON STRL SZ 6.5 (GLOVE) IMPLANT
GLOVE BIO SURGEON STRL SZ7.5 (GLOVE) ×1 IMPLANT
GLOVE BIOGEL PI IND STRL 8 (GLOVE) ×1 IMPLANT
GLOVE SURG POLY ORTHO LF SZ7.5 (GLOVE) IMPLANT
GLOVE SURG UNDER LTX SZ8 (GLOVE) ×1 IMPLANT
GOWN STRL REUS W/ TWL LRG LVL3 (GOWN DISPOSABLE) ×3 IMPLANT
GOWN STRL REUS W/TWL LRG LVL3 (GOWN DISPOSABLE) ×3
KIT BASIN OR (CUSTOM PROCEDURE TRAY) ×1 IMPLANT
KIT TURNOVER KIT B (KITS) ×1 IMPLANT
NS IRRIG 1000ML POUR BTL (IV SOLUTION) ×1 IMPLANT
PACK CV ACCESS (CUSTOM PROCEDURE TRAY) ×1 IMPLANT
PAD ARMBOARD 7.5X6 YLW CONV (MISCELLANEOUS) ×2 IMPLANT
SLING ARM FOAM STRAP LRG (SOFTGOODS) IMPLANT
SLING ARM FOAM STRAP MED (SOFTGOODS) IMPLANT
SPIKE FLUID TRANSFER (MISCELLANEOUS) ×1 IMPLANT
SPONGE SURGIFOAM ABS GEL 100 (HEMOSTASIS) IMPLANT
SUT MNCRL AB 4-0 PS2 18 (SUTURE) ×2 IMPLANT
SUT PROLENE 6 0 BV (SUTURE) ×2 IMPLANT
SUT SILK 2 0 (SUTURE) ×1
SUT SILK 2 0 SH (SUTURE) IMPLANT
SUT SILK 2-0 18XBRD TIE 12 (SUTURE) IMPLANT
SUT VIC AB 3-0 SH 27 (SUTURE) ×2
SUT VIC AB 3-0 SH 27X BRD (SUTURE) ×2 IMPLANT
TAPE UMBILICAL 1/8X30 (MISCELLANEOUS) IMPLANT
TOWEL GREEN STERILE (TOWEL DISPOSABLE) ×1 IMPLANT
UNDERPAD 30X36 HEAVY ABSORB (UNDERPADS AND DIAPERS) ×1 IMPLANT
WATER STERILE IRR 1000ML POUR (IV SOLUTION) ×1 IMPLANT

## 2023-09-07 NOTE — Progress Notes (Signed)
Dr. Phoebe Perch aware of calcium ion on I-stat is abnormal. Stated it was okay.

## 2023-09-07 NOTE — Transfer of Care (Signed)
Immediate Anesthesia Transfer of Care Note  Patient: Tim Baker  Procedure(s) Performed: BASILIC VEIN TRANSPOSITION LEFT ARM (Left: Arm Upper)  Patient Location: PACU  Anesthesia Type:General  Level of Consciousness: sedated and drowsy  Airway & Oxygen Therapy: Patient Spontanous Breathing and Patient connected to face mask oxygen  Post-op Assessment: Report given to RN and Post -op Vital signs reviewed and stable  Post vital signs: Reviewed and stable  Last Vitals:  Vitals Value Taken Time  BP 119/65 09/07/23 1009  Temp 97.3   Pulse 69 09/07/23 1010  Resp 15 09/07/23 1010  SpO2 99 % 09/07/23 1010  Vitals shown include unfiled device data.  Last Pain:  Vitals:   09/07/23 0643  TempSrc:   PainSc: 0-No pain         Complications: No notable events documented.

## 2023-09-07 NOTE — Discharge Instructions (Signed)

## 2023-09-07 NOTE — Anesthesia Procedure Notes (Addendum)
Procedure Name: LMA Insertion Date/Time: 09/07/2023 7:38 AM  Performed by: Pincus Large, CRNAPre-anesthesia Checklist: Patient identified, Emergency Drugs available, Suction available and Patient being monitored Patient Re-evaluated:Patient Re-evaluated prior to induction Oxygen Delivery Method: Circle System Utilized Preoxygenation: Pre-oxygenation with 100% oxygen Induction Type: IV induction Ventilation: Mask ventilation without difficulty LMA: LMA inserted LMA Size: 5.0 Number of attempts: 1 Airway Equipment and Method: Bite block Placement Confirmation: positive ETCO2 Tube secured with: Tape Dental Injury: Teeth and Oropharynx as per pre-operative assessment

## 2023-09-07 NOTE — Anesthesia Postprocedure Evaluation (Signed)
Anesthesia Post Note  Patient: Tim Baker  Procedure(s) Performed: BASILIC VEIN TRANSPOSITION LEFT ARM (Left: Arm Upper)     Patient location during evaluation: PACU Anesthesia Type: General Level of consciousness: awake and alert Pain management: pain level controlled Vital Signs Assessment: post-procedure vital signs reviewed and stable Respiratory status: spontaneous breathing, nonlabored ventilation, respiratory function stable and patient connected to nasal cannula oxygen Cardiovascular status: blood pressure returned to baseline and stable Postop Assessment: no apparent nausea or vomiting Anesthetic complications: no   No notable events documented.  Last Vitals:  Vitals:   09/07/23 1025 09/07/23 1040  BP: 138/84 132/86  Pulse: 77 75  Resp: 12 13  Temp:  36.4 C  SpO2: 97% 98%    Last Pain:  Vitals:   09/07/23 1040  TempSrc:   PainSc: 0-No pain                 Athina Fahey S

## 2023-09-07 NOTE — H&P (Signed)
REASON FOR VISIT:    To evaluate for new access.   MEDICAL ISSUES:    END-STAGE RENAL DISEASE: This patient feels strongly about having access in the left arm.  His options there would be a basilic vein transposition versus an AV graft.  I have explained that if we do a basilic vein transposition this can sometimes be done in 1 stage.  If the vein is marginal then we often times do this in 2 stages to make sure the fistula matures adequately first.  If the vein is not adequate he would require placement of an upper arm graft.  We have discussed the indications for the surgery and the potential complications.  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  I do not think these should be done at the same time.  He dialyzes on Monday Wednesdays and Fridays and we will schedule his surgery for a Tuesday.   HPI:    Tim Baker is a pleasant 47 y.o. male who presents for evaluation for new access.  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  The upper arm cephalic vein is also thrombosed.  He had a tunneled dialysis catheter placed in the right IJ about 3 weeks ago and is using this for dialysis.  He was sent for evaluation for new access.  He is right-handed.  He feels fairly strongly about continuing to use the left arm for access.   He denies any recent uremic symptoms.       Past Medical History:  Diagnosis Date   Allergy     Anemia     ESRD (end stage renal disease) on dialysis [facility])      "MWF; [facility]" (12/31/2015)   GERD (gastroesophageal reflux disease)     GI bleed     H/O kidney transplant     Heart murmur      "slight one"   Hematochezia     History of blood transfusion 12/31/2015    "this is my 1st" (12/31/2015)    Hyperkalemia     Hypertension     Renal disorder                 Family History  Problem Relation Age of Onset   Hypertension Father     Glaucoma Father     Breast cancer Mother            SOCIAL HISTORY: Social History         Tobacco Use   Smoking status: Never   Smokeless tobacco: Never  Substance Use Topics   Alcohol use: Yes      Comment: one beer a month      Allergies  No Known Allergies           Current Outpatient Medications  Medication Sig Dispense Refill   amLODipine (NORVASC) 10 MG tablet Take 10 mg by mouth at bedtime.        amoxicillin-clavulanate (AUGMENTIN) 875-125 MG tablet Take 1 tablet by mouth every 12 (twelve) hours. 14 tablet 0   AURYXIA 1 GM 210 MG(Fe) tablet Take 630 mg by mouth with breakfast, with lunch, and with evening meal.  carvedilol (COREG) 25 MG tablet Take 25 mg by mouth in the morning and at bedtime.       cinacalcet (SENSIPAR) 60 MG tablet Take 60 mg by mouth daily.       labetalol (NORMODYNE) 300 MG tablet Take 600 mg by mouth 2 (two) times daily.       Lanthanum Carbonate 1000 MG PACK Take 2,000 mg by mouth 3 (three) times daily with meals.       Multiple Vitamins-Minerals (RENAPLEX) TABS Take 1 tablet by mouth daily.       omeprazole (PRILOSEC) 20 MG capsule Take 20 mg by mouth daily.          No current facility-administered medications for this visit.        REVIEW OF SYSTEMS:  [X]  denotes positive finding, [ ]  denotes negative finding Cardiac   Comments:  Chest pain or chest pressure:      Shortness of breath upon exertion:      Short of breath when lying flat:      Irregular heart rhythm:             Vascular      Pain in calf, thigh, or hip brought on by ambulation:      Pain in feet at night that wakes you up from your sleep:       Blood clot in your veins:      Leg swelling:              Pulmonary      Oxygen at home:      Productive cough:       Wheezing:              Neurologic      Sudden  weakness in arms or legs:       Sudden numbness in arms or legs:       Sudden onset of difficulty speaking or slurred speech:      Temporary loss of vision in one eye:       Problems with dizziness:              Gastrointestinal      Blood in stool:       Vomited blood:              Genitourinary      Burning when urinating:       Blood in urine:             Psychiatric      Major depression:              Hematologic      Bleeding problems:      Problems with blood clotting too easily:             Skin      Rashes or ulcers:             Constitutional      Fever or chills:        PHYSICAL EXAM:    Vitals:   09/07/23 0616  BP: 133/80  Pulse: 70  Resp: 18  Temp: 98.9 F (37.2 C)  SpO2: 96%        Vitals:    08/05/23 1618  BP: (!) 154/92  Pulse: 74  Resp: 20  Temp: 98.8 F (37.1 C)  SpO2: 94%  Weight: 242 lb (109.8 kg)  Height: 5\' 10"  (1.778 m)  GENERAL: The patient is a well-nourished male, in no acute  distress. The vital signs are documented above. CARDIAC: There is a regular rate and rhythm.  VASCULAR: He has palpable radial pulses bilaterally. He has a massively aneurysmal left forearm fistula which is clotted.  His upper arm cephalic vein is also thrombosed. PULMONARY: There is good air exchange bilaterally without wheezing or rales. MUSCULOSKELETAL: There are no major deformities or cyanosis. NEUROLOGIC: No focal weakness or paresthesias are detected. SKIN: There are no ulcers or rashes noted. PSYCHIATRIC: The patient has a normal affect.   DATA:     UPPER EXTREMITY ARTERIAL DUPLEX: I have reviewed the upper extremity arterial duplex scan that was done on 07/20/2023.  There was no obstruction visualized in the right upper extremity.  The brachial artery measured 5 mm in diameter with this study   UPPER EXTREMITY VEIN MAP: I reviewed his upper extremity vein map that was also done on 07/20/2023.  The basilic vein in the upper arm on the left has diameters  ranging from 6.6-8.1 mm.  The forearm and upper arm cephalic vein are thrombosed.

## 2023-09-07 NOTE — Anesthesia Preprocedure Evaluation (Signed)
Anesthesia Evaluation  Patient identified by MRN, date of birth, ID band Patient awake    Reviewed: Allergy & Precautions, H&P , NPO status , Patient's Chart, lab work & pertinent test results  Airway Mallampati: II   Neck ROM: full    Dental   Pulmonary neg pulmonary ROS   breath sounds clear to auscultation       Cardiovascular hypertension,  Rhythm:regular Rate:Normal     Neuro/Psych    GI/Hepatic ,GERD  ,,  Endo/Other    Renal/GU ESRF and DialysisRenal disease     Musculoskeletal   Abdominal   Peds  Hematology   Anesthesia Other Findings   Reproductive/Obstetrics                             Anesthesia Physical Anesthesia Plan  ASA: 4  Anesthesia Plan: General   Post-op Pain Management:    Induction: Intravenous  PONV Risk Score and Plan: 2 and Ondansetron, Dexamethasone, Midazolam and Treatment may vary due to age or medical condition  Airway Management Planned: LMA  Additional Equipment:   Intra-op Plan:   Post-operative Plan: Extubation in OR  Informed Consent: I have reviewed the patients History and Physical, chart, labs and discussed the procedure including the risks, benefits and alternatives for the proposed anesthesia with the patient or authorized representative who has indicated his/her understanding and acceptance.     Dental advisory given  Plan Discussed with: CRNA, Anesthesiologist and Surgeon  Anesthesia Plan Comments:        Anesthesia Quick Evaluation

## 2023-09-07 NOTE — Op Note (Signed)
NAME: Tim Baker    MRN: [id number] DOB: December 13, 1976    DATE OF OPERATION: 09/07/2023  PREOP DIAGNOSIS:    End-stage renal disease  POSTOP DIAGNOSIS:    Same  PROCEDURE:    Left basilic vein transposition  SURGEON: Di Kindle. Edilia Bo, MD  ASSIST: Aggie Moats, PA  ANESTHESIA: General  EBL: Minimal  INDICATIONS:    [name] is a 47 y.o. male who presents for new access.  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  I explained that we could address these later once we get functioning access in the left arm.  FINDINGS:   4-1/2 mm basilic vein  TECHNIQUE:   The patient was taken to the operating room and received a general anesthetic.  The left arm was prepped and draped in usual sterile fashion.  I looked at the basilic vein with the SonoSite and it was felt to be an adequate vein.  3 3 incisions along the medial aspect of the left arm the basilic vein was harvested up to the axilla.  There were 2 areas where the vein connected to the deep system with only a short branch connecting them.  These were ligated.  Through the distal incision the brachial artery was dissected free beneath the fascia.  The artery was somewhat large and fragile.  A tunnel was created between this incision and the axillary incision and the patient was heparinized.  An umbilical tape was brought through the tunnel.  The vein was ligated distally and then spatulated.  It was distended with heparinized saline and marked of a twisting.  I then passed the uterine dressing forcep through the tunnel with the umbilical tape and then brought the the vein through the tunnel being careful to prevent twisting.  The brachial artery was clamped proximally and distally and a longitudinal arteriotomy was made.  The vein was sewn end-to-side to the arteries and continuous 6-0 Prolene suture.  At the completion there was excellent thrill in the fistula and a palpable radial  pulse.  Hemostasis was obtained the wounds.  The heparin was partially reversed with protamine.  The wounds were closed with a deep layer of 3-0 Vicryl and the skin closed with 4-0 Monocryl.  Dermabond was applied.  The patient tolerated the procedure well and was transferred to the recovery in stable condition.  All needle and sponge counts were correct.  Given the complexity of the case,  the assistant was necessary in order to expedient the procedure and safely perform the technical aspects of the operation.  The assistant provided traction and countertraction to assist with exposure of the artery and vein.  They also assisted with suture ligation of multiple venous branches.  They played a critical role in the anastomosis. These skills, especially following the Prolene suture for the anastomosis, could not have been adequately performed by a scrub tech assistant.    Waverly Ferrari, MD, FACS [facility]  DATE OF DICTATION:   09/07/2023

## 2023-09-08 ENCOUNTER — Encounter (HOSPITAL_COMMUNITY): Payer: Self-pay | Admitting: Vascular Surgery

## 2023-09-08 DIAGNOSIS — N2581 Secondary hyperparathyroidism of renal origin: Secondary | ICD-10-CM | POA: Diagnosis not present

## 2023-09-08 DIAGNOSIS — T8249XD Other complication of vascular dialysis catheter, subsequent encounter: Secondary | ICD-10-CM | POA: Diagnosis not present

## 2023-09-08 DIAGNOSIS — D631 Anemia in chronic kidney disease: Secondary | ICD-10-CM | POA: Diagnosis not present

## 2023-09-08 DIAGNOSIS — D689 Coagulation defect, unspecified: Secondary | ICD-10-CM | POA: Diagnosis not present

## 2023-09-08 DIAGNOSIS — Z5181 Encounter for therapeutic drug level monitoring: Secondary | ICD-10-CM | POA: Diagnosis not present

## 2023-09-08 DIAGNOSIS — Z992 Dependence on renal dialysis: Secondary | ICD-10-CM | POA: Diagnosis not present

## 2023-09-08 DIAGNOSIS — N186 End stage renal disease: Secondary | ICD-10-CM | POA: Diagnosis not present

## 2023-09-08 DIAGNOSIS — R519 Headache, unspecified: Secondary | ICD-10-CM | POA: Diagnosis not present

## 2023-09-10 DIAGNOSIS — R519 Headache, unspecified: Secondary | ICD-10-CM | POA: Diagnosis not present

## 2023-09-10 DIAGNOSIS — T8249XD Other complication of vascular dialysis catheter, subsequent encounter: Secondary | ICD-10-CM | POA: Diagnosis not present

## 2023-09-10 DIAGNOSIS — D689 Coagulation defect, unspecified: Secondary | ICD-10-CM | POA: Diagnosis not present

## 2023-09-10 DIAGNOSIS — Z5181 Encounter for therapeutic drug level monitoring: Secondary | ICD-10-CM | POA: Diagnosis not present

## 2023-09-10 DIAGNOSIS — Z992 Dependence on renal dialysis: Secondary | ICD-10-CM | POA: Diagnosis not present

## 2023-09-10 DIAGNOSIS — D631 Anemia in chronic kidney disease: Secondary | ICD-10-CM | POA: Diagnosis not present

## 2023-09-10 DIAGNOSIS — N186 End stage renal disease: Secondary | ICD-10-CM | POA: Diagnosis not present

## 2023-09-10 DIAGNOSIS — N2581 Secondary hyperparathyroidism of renal origin: Secondary | ICD-10-CM | POA: Diagnosis not present

## 2023-09-13 DIAGNOSIS — R519 Headache, unspecified: Secondary | ICD-10-CM | POA: Diagnosis not present

## 2023-09-13 DIAGNOSIS — Z5181 Encounter for therapeutic drug level monitoring: Secondary | ICD-10-CM | POA: Diagnosis not present

## 2023-09-13 DIAGNOSIS — N2581 Secondary hyperparathyroidism of renal origin: Secondary | ICD-10-CM | POA: Diagnosis not present

## 2023-09-13 DIAGNOSIS — D631 Anemia in chronic kidney disease: Secondary | ICD-10-CM | POA: Diagnosis not present

## 2023-09-13 DIAGNOSIS — Z992 Dependence on renal dialysis: Secondary | ICD-10-CM | POA: Diagnosis not present

## 2023-09-13 DIAGNOSIS — N186 End stage renal disease: Secondary | ICD-10-CM | POA: Diagnosis not present

## 2023-09-13 DIAGNOSIS — T8249XD Other complication of vascular dialysis catheter, subsequent encounter: Secondary | ICD-10-CM | POA: Diagnosis not present

## 2023-09-13 DIAGNOSIS — D689 Coagulation defect, unspecified: Secondary | ICD-10-CM | POA: Diagnosis not present

## 2023-09-13 DIAGNOSIS — T8610 Unspecified complication of kidney transplant: Secondary | ICD-10-CM | POA: Diagnosis not present

## 2023-09-15 DIAGNOSIS — N186 End stage renal disease: Secondary | ICD-10-CM | POA: Diagnosis not present

## 2023-09-15 DIAGNOSIS — T8249XD Other complication of vascular dialysis catheter, subsequent encounter: Secondary | ICD-10-CM | POA: Diagnosis not present

## 2023-09-15 DIAGNOSIS — Z23 Encounter for immunization: Secondary | ICD-10-CM | POA: Diagnosis not present

## 2023-09-15 DIAGNOSIS — D689 Coagulation defect, unspecified: Secondary | ICD-10-CM | POA: Diagnosis not present

## 2023-09-15 DIAGNOSIS — Z992 Dependence on renal dialysis: Secondary | ICD-10-CM | POA: Diagnosis not present

## 2023-09-15 DIAGNOSIS — R519 Headache, unspecified: Secondary | ICD-10-CM | POA: Diagnosis not present

## 2023-09-15 DIAGNOSIS — D631 Anemia in chronic kidney disease: Secondary | ICD-10-CM | POA: Diagnosis not present

## 2023-09-15 DIAGNOSIS — Z5181 Encounter for therapeutic drug level monitoring: Secondary | ICD-10-CM | POA: Diagnosis not present

## 2023-09-15 DIAGNOSIS — N2581 Secondary hyperparathyroidism of renal origin: Secondary | ICD-10-CM | POA: Diagnosis not present

## 2023-09-17 DIAGNOSIS — Z992 Dependence on renal dialysis: Secondary | ICD-10-CM | POA: Diagnosis not present

## 2023-09-17 DIAGNOSIS — T8249XD Other complication of vascular dialysis catheter, subsequent encounter: Secondary | ICD-10-CM | POA: Diagnosis not present

## 2023-09-17 DIAGNOSIS — N186 End stage renal disease: Secondary | ICD-10-CM | POA: Diagnosis not present

## 2023-09-17 DIAGNOSIS — Z23 Encounter for immunization: Secondary | ICD-10-CM | POA: Diagnosis not present

## 2023-09-17 DIAGNOSIS — R519 Headache, unspecified: Secondary | ICD-10-CM | POA: Diagnosis not present

## 2023-09-17 DIAGNOSIS — D631 Anemia in chronic kidney disease: Secondary | ICD-10-CM | POA: Diagnosis not present

## 2023-09-17 DIAGNOSIS — Z5181 Encounter for therapeutic drug level monitoring: Secondary | ICD-10-CM | POA: Diagnosis not present

## 2023-09-17 DIAGNOSIS — D689 Coagulation defect, unspecified: Secondary | ICD-10-CM | POA: Diagnosis not present

## 2023-09-17 DIAGNOSIS — N2581 Secondary hyperparathyroidism of renal origin: Secondary | ICD-10-CM | POA: Diagnosis not present

## 2023-09-20 DIAGNOSIS — R519 Headache, unspecified: Secondary | ICD-10-CM | POA: Diagnosis not present

## 2023-09-20 DIAGNOSIS — Z23 Encounter for immunization: Secondary | ICD-10-CM | POA: Diagnosis not present

## 2023-09-20 DIAGNOSIS — Z992 Dependence on renal dialysis: Secondary | ICD-10-CM | POA: Diagnosis not present

## 2023-09-20 DIAGNOSIS — Z5181 Encounter for therapeutic drug level monitoring: Secondary | ICD-10-CM | POA: Diagnosis not present

## 2023-09-20 DIAGNOSIS — D631 Anemia in chronic kidney disease: Secondary | ICD-10-CM | POA: Diagnosis not present

## 2023-09-20 DIAGNOSIS — N2581 Secondary hyperparathyroidism of renal origin: Secondary | ICD-10-CM | POA: Diagnosis not present

## 2023-09-20 DIAGNOSIS — N186 End stage renal disease: Secondary | ICD-10-CM | POA: Diagnosis not present

## 2023-09-20 DIAGNOSIS — D689 Coagulation defect, unspecified: Secondary | ICD-10-CM | POA: Diagnosis not present

## 2023-09-20 DIAGNOSIS — T8249XD Other complication of vascular dialysis catheter, subsequent encounter: Secondary | ICD-10-CM | POA: Diagnosis not present

## 2023-09-22 DIAGNOSIS — T8249XD Other complication of vascular dialysis catheter, subsequent encounter: Secondary | ICD-10-CM | POA: Diagnosis not present

## 2023-09-22 DIAGNOSIS — Z5181 Encounter for therapeutic drug level monitoring: Secondary | ICD-10-CM | POA: Diagnosis not present

## 2023-09-22 DIAGNOSIS — Z23 Encounter for immunization: Secondary | ICD-10-CM | POA: Diagnosis not present

## 2023-09-22 DIAGNOSIS — D689 Coagulation defect, unspecified: Secondary | ICD-10-CM | POA: Diagnosis not present

## 2023-09-22 DIAGNOSIS — N2581 Secondary hyperparathyroidism of renal origin: Secondary | ICD-10-CM | POA: Diagnosis not present

## 2023-09-22 DIAGNOSIS — Z992 Dependence on renal dialysis: Secondary | ICD-10-CM | POA: Diagnosis not present

## 2023-09-22 DIAGNOSIS — N186 End stage renal disease: Secondary | ICD-10-CM | POA: Diagnosis not present

## 2023-09-22 DIAGNOSIS — D631 Anemia in chronic kidney disease: Secondary | ICD-10-CM | POA: Diagnosis not present

## 2023-09-22 DIAGNOSIS — R519 Headache, unspecified: Secondary | ICD-10-CM | POA: Diagnosis not present

## 2023-09-24 DIAGNOSIS — D631 Anemia in chronic kidney disease: Secondary | ICD-10-CM | POA: Diagnosis not present

## 2023-09-24 DIAGNOSIS — Z5181 Encounter for therapeutic drug level monitoring: Secondary | ICD-10-CM | POA: Diagnosis not present

## 2023-09-24 DIAGNOSIS — N2581 Secondary hyperparathyroidism of renal origin: Secondary | ICD-10-CM | POA: Diagnosis not present

## 2023-09-24 DIAGNOSIS — T8249XD Other complication of vascular dialysis catheter, subsequent encounter: Secondary | ICD-10-CM | POA: Diagnosis not present

## 2023-09-24 DIAGNOSIS — Z23 Encounter for immunization: Secondary | ICD-10-CM | POA: Diagnosis not present

## 2023-09-24 DIAGNOSIS — Z992 Dependence on renal dialysis: Secondary | ICD-10-CM | POA: Diagnosis not present

## 2023-09-24 DIAGNOSIS — R519 Headache, unspecified: Secondary | ICD-10-CM | POA: Diagnosis not present

## 2023-09-24 DIAGNOSIS — N186 End stage renal disease: Secondary | ICD-10-CM | POA: Diagnosis not present

## 2023-09-24 DIAGNOSIS — D689 Coagulation defect, unspecified: Secondary | ICD-10-CM | POA: Diagnosis not present

## 2023-09-25 DIAGNOSIS — Z23 Encounter for immunization: Secondary | ICD-10-CM | POA: Diagnosis not present

## 2023-09-25 DIAGNOSIS — T8249XD Other complication of vascular dialysis catheter, subsequent encounter: Secondary | ICD-10-CM | POA: Diagnosis not present

## 2023-09-25 DIAGNOSIS — Z992 Dependence on renal dialysis: Secondary | ICD-10-CM | POA: Diagnosis not present

## 2023-09-25 DIAGNOSIS — D631 Anemia in chronic kidney disease: Secondary | ICD-10-CM | POA: Diagnosis not present

## 2023-09-25 DIAGNOSIS — N186 End stage renal disease: Secondary | ICD-10-CM | POA: Diagnosis not present

## 2023-09-25 DIAGNOSIS — R519 Headache, unspecified: Secondary | ICD-10-CM | POA: Diagnosis not present

## 2023-09-25 DIAGNOSIS — Z5181 Encounter for therapeutic drug level monitoring: Secondary | ICD-10-CM | POA: Diagnosis not present

## 2023-09-25 DIAGNOSIS — N2581 Secondary hyperparathyroidism of renal origin: Secondary | ICD-10-CM | POA: Diagnosis not present

## 2023-09-25 DIAGNOSIS — D689 Coagulation defect, unspecified: Secondary | ICD-10-CM | POA: Diagnosis not present

## 2023-09-27 DIAGNOSIS — R519 Headache, unspecified: Secondary | ICD-10-CM | POA: Diagnosis not present

## 2023-09-27 DIAGNOSIS — D689 Coagulation defect, unspecified: Secondary | ICD-10-CM | POA: Diagnosis not present

## 2023-09-27 DIAGNOSIS — T8249XD Other complication of vascular dialysis catheter, subsequent encounter: Secondary | ICD-10-CM | POA: Diagnosis not present

## 2023-09-27 DIAGNOSIS — D631 Anemia in chronic kidney disease: Secondary | ICD-10-CM | POA: Diagnosis not present

## 2023-09-27 DIAGNOSIS — Z5181 Encounter for therapeutic drug level monitoring: Secondary | ICD-10-CM | POA: Diagnosis not present

## 2023-09-27 DIAGNOSIS — Z23 Encounter for immunization: Secondary | ICD-10-CM | POA: Diagnosis not present

## 2023-09-27 DIAGNOSIS — N186 End stage renal disease: Secondary | ICD-10-CM | POA: Diagnosis not present

## 2023-09-27 DIAGNOSIS — Z992 Dependence on renal dialysis: Secondary | ICD-10-CM | POA: Diagnosis not present

## 2023-09-27 DIAGNOSIS — N2581 Secondary hyperparathyroidism of renal origin: Secondary | ICD-10-CM | POA: Diagnosis not present

## 2023-09-29 DIAGNOSIS — D631 Anemia in chronic kidney disease: Secondary | ICD-10-CM | POA: Diagnosis not present

## 2023-09-29 DIAGNOSIS — N2581 Secondary hyperparathyroidism of renal origin: Secondary | ICD-10-CM | POA: Diagnosis not present

## 2023-09-29 DIAGNOSIS — N186 End stage renal disease: Secondary | ICD-10-CM | POA: Diagnosis not present

## 2023-09-29 DIAGNOSIS — Z992 Dependence on renal dialysis: Secondary | ICD-10-CM | POA: Diagnosis not present

## 2023-09-29 DIAGNOSIS — D689 Coagulation defect, unspecified: Secondary | ICD-10-CM | POA: Diagnosis not present

## 2023-09-29 DIAGNOSIS — T8249XD Other complication of vascular dialysis catheter, subsequent encounter: Secondary | ICD-10-CM | POA: Diagnosis not present

## 2023-09-29 DIAGNOSIS — R519 Headache, unspecified: Secondary | ICD-10-CM | POA: Diagnosis not present

## 2023-09-29 DIAGNOSIS — Z5181 Encounter for therapeutic drug level monitoring: Secondary | ICD-10-CM | POA: Diagnosis not present

## 2023-09-29 DIAGNOSIS — Z23 Encounter for immunization: Secondary | ICD-10-CM | POA: Diagnosis not present

## 2023-10-01 DIAGNOSIS — T8249XD Other complication of vascular dialysis catheter, subsequent encounter: Secondary | ICD-10-CM | POA: Diagnosis not present

## 2023-10-01 DIAGNOSIS — Z23 Encounter for immunization: Secondary | ICD-10-CM | POA: Diagnosis not present

## 2023-10-01 DIAGNOSIS — Z992 Dependence on renal dialysis: Secondary | ICD-10-CM | POA: Diagnosis not present

## 2023-10-01 DIAGNOSIS — R519 Headache, unspecified: Secondary | ICD-10-CM | POA: Diagnosis not present

## 2023-10-01 DIAGNOSIS — Z5181 Encounter for therapeutic drug level monitoring: Secondary | ICD-10-CM | POA: Diagnosis not present

## 2023-10-01 DIAGNOSIS — N2581 Secondary hyperparathyroidism of renal origin: Secondary | ICD-10-CM | POA: Diagnosis not present

## 2023-10-01 DIAGNOSIS — D631 Anemia in chronic kidney disease: Secondary | ICD-10-CM | POA: Diagnosis not present

## 2023-10-01 DIAGNOSIS — D689 Coagulation defect, unspecified: Secondary | ICD-10-CM | POA: Diagnosis not present

## 2023-10-01 DIAGNOSIS — N186 End stage renal disease: Secondary | ICD-10-CM | POA: Diagnosis not present

## 2023-10-04 DIAGNOSIS — R519 Headache, unspecified: Secondary | ICD-10-CM | POA: Diagnosis not present

## 2023-10-04 DIAGNOSIS — N2581 Secondary hyperparathyroidism of renal origin: Secondary | ICD-10-CM | POA: Diagnosis not present

## 2023-10-04 DIAGNOSIS — D631 Anemia in chronic kidney disease: Secondary | ICD-10-CM | POA: Diagnosis not present

## 2023-10-04 DIAGNOSIS — Z992 Dependence on renal dialysis: Secondary | ICD-10-CM | POA: Diagnosis not present

## 2023-10-04 DIAGNOSIS — T8249XD Other complication of vascular dialysis catheter, subsequent encounter: Secondary | ICD-10-CM | POA: Diagnosis not present

## 2023-10-04 DIAGNOSIS — Z5181 Encounter for therapeutic drug level monitoring: Secondary | ICD-10-CM | POA: Diagnosis not present

## 2023-10-04 DIAGNOSIS — N186 End stage renal disease: Secondary | ICD-10-CM | POA: Diagnosis not present

## 2023-10-04 DIAGNOSIS — Z23 Encounter for immunization: Secondary | ICD-10-CM | POA: Diagnosis not present

## 2023-10-04 DIAGNOSIS — D689 Coagulation defect, unspecified: Secondary | ICD-10-CM | POA: Diagnosis not present

## 2023-10-06 DIAGNOSIS — Z23 Encounter for immunization: Secondary | ICD-10-CM | POA: Diagnosis not present

## 2023-10-06 DIAGNOSIS — Z5181 Encounter for therapeutic drug level monitoring: Secondary | ICD-10-CM | POA: Diagnosis not present

## 2023-10-06 DIAGNOSIS — T8249XD Other complication of vascular dialysis catheter, subsequent encounter: Secondary | ICD-10-CM | POA: Diagnosis not present

## 2023-10-06 DIAGNOSIS — D631 Anemia in chronic kidney disease: Secondary | ICD-10-CM | POA: Diagnosis not present

## 2023-10-06 DIAGNOSIS — N186 End stage renal disease: Secondary | ICD-10-CM | POA: Diagnosis not present

## 2023-10-06 DIAGNOSIS — Z992 Dependence on renal dialysis: Secondary | ICD-10-CM | POA: Diagnosis not present

## 2023-10-06 DIAGNOSIS — N2581 Secondary hyperparathyroidism of renal origin: Secondary | ICD-10-CM | POA: Diagnosis not present

## 2023-10-06 DIAGNOSIS — R519 Headache, unspecified: Secondary | ICD-10-CM | POA: Diagnosis not present

## 2023-10-06 DIAGNOSIS — D689 Coagulation defect, unspecified: Secondary | ICD-10-CM | POA: Diagnosis not present

## 2023-10-07 ENCOUNTER — Ambulatory Visit (INDEPENDENT_AMBULATORY_CARE_PROVIDER_SITE_OTHER): Payer: Medicare Other | Admitting: Physician Assistant

## 2023-10-07 VITALS — BP 151/79 | HR 75 | Temp 97.9°F | Ht 70.0 in | Wt 236.1 lb

## 2023-10-07 DIAGNOSIS — N186 End stage renal disease: Secondary | ICD-10-CM

## 2023-10-07 DIAGNOSIS — Z992 Dependence on renal dialysis: Secondary | ICD-10-CM

## 2023-10-07 NOTE — Progress Notes (Signed)
    Postoperative Access Visit   History of Present Illness   Tim Baker is a 47 y.o. year old male who presents for postoperative follow-up for: left basilic vein transposition by Dr. Edilia Bo on 09/07/23.  The patient's wounds are healing well. The patient notes no steal symptoms. He had previous left forearm AV fistula that is occluded.  He has two aneurysms present in the old fistula. No compromised skin. The patient is able to complete their activities of daily living.   He currently dialyzes on MWF via right internal jugular catheter at the [place] location   Physical Examination   Vitals:   10/07/23 1505  BP: (!) 151/79  Pulse: 75  Temp: 97.9 F (36.6 C)  SpO2: 97%  Weight: 236 lb 1.6 oz (107.1 kg)  Height: 5\' 10"  (1.778 m)   Body mass index is 33.88 kg/m.  left arm Incisions are healing well, 2+ radial pulse, hand grip is 5/5, sensation in digits is intact, palpable thrill, bruit can be auscultated. 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 No compromised skin.    Medical Decision Making   Tim Baker is a 47 y.o. year old male who presents s/p left basilic vein transposition by Dr. Edilia Bo on 09/07/23.  The patient's wounds are healing well. The patient notes no steal symptoms. The fistula is functioning well and is easily palpable in left upper arm.Patent is without signs or symptoms of steal syndrome. He would like to have the 2 aneurysms in his left forearm excised as previously discussed with Dr. Edilia Bo. Recommend waiting until he has fully healed from his recent access surgery.  The patient's access will be ready for use 12/07/23 The patient's tunneled dialysis catheter can be removed when Nephrology is comfortable with the performance of the left basilic vein fistula He would like to follow up in 3 months to discuss excision of his aneurysm in his old occluded left  RC AV fistula    Graceann Congress, PA-C [facility]  [place] Office: [phone]  Clinic MD: Karin Lieu

## 2023-10-08 DIAGNOSIS — N2581 Secondary hyperparathyroidism of renal origin: Secondary | ICD-10-CM | POA: Diagnosis not present

## 2023-10-08 DIAGNOSIS — R519 Headache, unspecified: Secondary | ICD-10-CM | POA: Diagnosis not present

## 2023-10-08 DIAGNOSIS — Z5181 Encounter for therapeutic drug level monitoring: Secondary | ICD-10-CM | POA: Diagnosis not present

## 2023-10-08 DIAGNOSIS — D689 Coagulation defect, unspecified: Secondary | ICD-10-CM | POA: Diagnosis not present

## 2023-10-08 DIAGNOSIS — Z23 Encounter for immunization: Secondary | ICD-10-CM | POA: Diagnosis not present

## 2023-10-08 DIAGNOSIS — T8249XD Other complication of vascular dialysis catheter, subsequent encounter: Secondary | ICD-10-CM | POA: Diagnosis not present

## 2023-10-08 DIAGNOSIS — D631 Anemia in chronic kidney disease: Secondary | ICD-10-CM | POA: Diagnosis not present

## 2023-10-08 DIAGNOSIS — Z992 Dependence on renal dialysis: Secondary | ICD-10-CM | POA: Diagnosis not present

## 2023-10-08 DIAGNOSIS — N186 End stage renal disease: Secondary | ICD-10-CM | POA: Diagnosis not present

## 2023-10-11 DIAGNOSIS — Z992 Dependence on renal dialysis: Secondary | ICD-10-CM | POA: Diagnosis not present

## 2023-10-11 DIAGNOSIS — N2581 Secondary hyperparathyroidism of renal origin: Secondary | ICD-10-CM | POA: Diagnosis not present

## 2023-10-11 DIAGNOSIS — Z23 Encounter for immunization: Secondary | ICD-10-CM | POA: Diagnosis not present

## 2023-10-11 DIAGNOSIS — T8249XD Other complication of vascular dialysis catheter, subsequent encounter: Secondary | ICD-10-CM | POA: Diagnosis not present

## 2023-10-11 DIAGNOSIS — R519 Headache, unspecified: Secondary | ICD-10-CM | POA: Diagnosis not present

## 2023-10-11 DIAGNOSIS — D689 Coagulation defect, unspecified: Secondary | ICD-10-CM | POA: Diagnosis not present

## 2023-10-11 DIAGNOSIS — N186 End stage renal disease: Secondary | ICD-10-CM | POA: Diagnosis not present

## 2023-10-11 DIAGNOSIS — Z5181 Encounter for therapeutic drug level monitoring: Secondary | ICD-10-CM | POA: Diagnosis not present

## 2023-10-11 DIAGNOSIS — D631 Anemia in chronic kidney disease: Secondary | ICD-10-CM | POA: Diagnosis not present

## 2023-10-13 DIAGNOSIS — N186 End stage renal disease: Secondary | ICD-10-CM | POA: Diagnosis not present

## 2023-10-13 DIAGNOSIS — T8249XD Other complication of vascular dialysis catheter, subsequent encounter: Secondary | ICD-10-CM | POA: Diagnosis not present

## 2023-10-13 DIAGNOSIS — R519 Headache, unspecified: Secondary | ICD-10-CM | POA: Diagnosis not present

## 2023-10-13 DIAGNOSIS — Z23 Encounter for immunization: Secondary | ICD-10-CM | POA: Diagnosis not present

## 2023-10-13 DIAGNOSIS — D689 Coagulation defect, unspecified: Secondary | ICD-10-CM | POA: Diagnosis not present

## 2023-10-13 DIAGNOSIS — N2581 Secondary hyperparathyroidism of renal origin: Secondary | ICD-10-CM | POA: Diagnosis not present

## 2023-10-13 DIAGNOSIS — Z5181 Encounter for therapeutic drug level monitoring: Secondary | ICD-10-CM | POA: Diagnosis not present

## 2023-10-13 DIAGNOSIS — D631 Anemia in chronic kidney disease: Secondary | ICD-10-CM | POA: Diagnosis not present

## 2023-10-13 DIAGNOSIS — Z992 Dependence on renal dialysis: Secondary | ICD-10-CM | POA: Diagnosis not present

## 2023-10-14 DIAGNOSIS — Z992 Dependence on renal dialysis: Secondary | ICD-10-CM | POA: Diagnosis not present

## 2023-10-14 DIAGNOSIS — N186 End stage renal disease: Secondary | ICD-10-CM | POA: Diagnosis not present

## 2023-10-14 DIAGNOSIS — T8610 Unspecified complication of kidney transplant: Secondary | ICD-10-CM | POA: Diagnosis not present

## 2023-10-15 DIAGNOSIS — R197 Diarrhea, unspecified: Secondary | ICD-10-CM | POA: Diagnosis not present

## 2023-10-15 DIAGNOSIS — N186 End stage renal disease: Secondary | ICD-10-CM | POA: Diagnosis not present

## 2023-10-15 DIAGNOSIS — N2581 Secondary hyperparathyroidism of renal origin: Secondary | ICD-10-CM | POA: Diagnosis not present

## 2023-10-15 DIAGNOSIS — Z5181 Encounter for therapeutic drug level monitoring: Secondary | ICD-10-CM | POA: Diagnosis not present

## 2023-10-15 DIAGNOSIS — D689 Coagulation defect, unspecified: Secondary | ICD-10-CM | POA: Diagnosis not present

## 2023-10-15 DIAGNOSIS — T8249XD Other complication of vascular dialysis catheter, subsequent encounter: Secondary | ICD-10-CM | POA: Diagnosis not present

## 2023-10-15 DIAGNOSIS — E875 Hyperkalemia: Secondary | ICD-10-CM | POA: Diagnosis not present

## 2023-10-15 DIAGNOSIS — D631 Anemia in chronic kidney disease: Secondary | ICD-10-CM | POA: Diagnosis not present

## 2023-10-15 DIAGNOSIS — Z992 Dependence on renal dialysis: Secondary | ICD-10-CM | POA: Diagnosis not present

## 2023-10-18 DIAGNOSIS — N186 End stage renal disease: Secondary | ICD-10-CM | POA: Diagnosis not present

## 2023-10-18 DIAGNOSIS — D631 Anemia in chronic kidney disease: Secondary | ICD-10-CM | POA: Diagnosis not present

## 2023-10-18 DIAGNOSIS — N2581 Secondary hyperparathyroidism of renal origin: Secondary | ICD-10-CM | POA: Diagnosis not present

## 2023-10-18 DIAGNOSIS — T8249XD Other complication of vascular dialysis catheter, subsequent encounter: Secondary | ICD-10-CM | POA: Diagnosis not present

## 2023-10-18 DIAGNOSIS — R197 Diarrhea, unspecified: Secondary | ICD-10-CM | POA: Diagnosis not present

## 2023-10-18 DIAGNOSIS — Z5181 Encounter for therapeutic drug level monitoring: Secondary | ICD-10-CM | POA: Diagnosis not present

## 2023-10-18 DIAGNOSIS — Z992 Dependence on renal dialysis: Secondary | ICD-10-CM | POA: Diagnosis not present

## 2023-10-18 DIAGNOSIS — E875 Hyperkalemia: Secondary | ICD-10-CM | POA: Diagnosis not present

## 2023-10-18 DIAGNOSIS — D689 Coagulation defect, unspecified: Secondary | ICD-10-CM | POA: Diagnosis not present

## 2023-10-20 DIAGNOSIS — N2581 Secondary hyperparathyroidism of renal origin: Secondary | ICD-10-CM | POA: Diagnosis not present

## 2023-10-20 DIAGNOSIS — E875 Hyperkalemia: Secondary | ICD-10-CM | POA: Diagnosis not present

## 2023-10-20 DIAGNOSIS — Z992 Dependence on renal dialysis: Secondary | ICD-10-CM | POA: Diagnosis not present

## 2023-10-20 DIAGNOSIS — R197 Diarrhea, unspecified: Secondary | ICD-10-CM | POA: Diagnosis not present

## 2023-10-20 DIAGNOSIS — N186 End stage renal disease: Secondary | ICD-10-CM | POA: Diagnosis not present

## 2023-10-20 DIAGNOSIS — D689 Coagulation defect, unspecified: Secondary | ICD-10-CM | POA: Diagnosis not present

## 2023-10-20 DIAGNOSIS — D631 Anemia in chronic kidney disease: Secondary | ICD-10-CM | POA: Diagnosis not present

## 2023-10-20 DIAGNOSIS — T8249XD Other complication of vascular dialysis catheter, subsequent encounter: Secondary | ICD-10-CM | POA: Diagnosis not present

## 2023-10-20 DIAGNOSIS — Z5181 Encounter for therapeutic drug level monitoring: Secondary | ICD-10-CM | POA: Diagnosis not present

## 2023-10-21 ENCOUNTER — Telehealth: Payer: Self-pay

## 2023-10-21 NOTE — Telephone Encounter (Signed)
Pt called requesting a return call for concerns about fistula.  Reviewed pt's chart, returned call for clarification, two identifiers used. Pt stated that his L proximal aneurysm felt more sore and firm over the past 4-5 days than it had previously. He also c/o of a "feeling of going to sleep" in his L hand. He denies any discoloration, sores, or loss of fx. He uses elevation and ice to improve symptoms.  Spoke with [place], [place] who advised that the pt could be seen earlier if his symptoms were worsening or unmanageable. The POC remained the same.  Called pt and relayed advice. Instructed him to monitor and if symptoms worsened, he could call the office and have his appt moved earlier. Confirmed understanding.

## 2023-10-22 DIAGNOSIS — E875 Hyperkalemia: Secondary | ICD-10-CM | POA: Diagnosis not present

## 2023-10-22 DIAGNOSIS — N2581 Secondary hyperparathyroidism of renal origin: Secondary | ICD-10-CM | POA: Diagnosis not present

## 2023-10-22 DIAGNOSIS — N186 End stage renal disease: Secondary | ICD-10-CM | POA: Diagnosis not present

## 2023-10-22 DIAGNOSIS — Z5181 Encounter for therapeutic drug level monitoring: Secondary | ICD-10-CM | POA: Diagnosis not present

## 2023-10-22 DIAGNOSIS — Z992 Dependence on renal dialysis: Secondary | ICD-10-CM | POA: Diagnosis not present

## 2023-10-22 DIAGNOSIS — D689 Coagulation defect, unspecified: Secondary | ICD-10-CM | POA: Diagnosis not present

## 2023-10-22 DIAGNOSIS — T8249XD Other complication of vascular dialysis catheter, subsequent encounter: Secondary | ICD-10-CM | POA: Diagnosis not present

## 2023-10-22 DIAGNOSIS — D631 Anemia in chronic kidney disease: Secondary | ICD-10-CM | POA: Diagnosis not present

## 2023-10-22 DIAGNOSIS — R197 Diarrhea, unspecified: Secondary | ICD-10-CM | POA: Diagnosis not present

## 2023-10-25 DIAGNOSIS — D631 Anemia in chronic kidney disease: Secondary | ICD-10-CM | POA: Diagnosis not present

## 2023-10-25 DIAGNOSIS — Z5181 Encounter for therapeutic drug level monitoring: Secondary | ICD-10-CM | POA: Diagnosis not present

## 2023-10-25 DIAGNOSIS — D689 Coagulation defect, unspecified: Secondary | ICD-10-CM | POA: Diagnosis not present

## 2023-10-25 DIAGNOSIS — N2581 Secondary hyperparathyroidism of renal origin: Secondary | ICD-10-CM | POA: Diagnosis not present

## 2023-10-25 DIAGNOSIS — N186 End stage renal disease: Secondary | ICD-10-CM | POA: Diagnosis not present

## 2023-10-25 DIAGNOSIS — Z992 Dependence on renal dialysis: Secondary | ICD-10-CM | POA: Diagnosis not present

## 2023-10-25 DIAGNOSIS — E875 Hyperkalemia: Secondary | ICD-10-CM | POA: Diagnosis not present

## 2023-10-25 DIAGNOSIS — R197 Diarrhea, unspecified: Secondary | ICD-10-CM | POA: Diagnosis not present

## 2023-10-25 DIAGNOSIS — T8249XD Other complication of vascular dialysis catheter, subsequent encounter: Secondary | ICD-10-CM | POA: Diagnosis not present

## 2023-10-27 DIAGNOSIS — E875 Hyperkalemia: Secondary | ICD-10-CM | POA: Diagnosis not present

## 2023-10-27 DIAGNOSIS — D689 Coagulation defect, unspecified: Secondary | ICD-10-CM | POA: Diagnosis not present

## 2023-10-27 DIAGNOSIS — N186 End stage renal disease: Secondary | ICD-10-CM | POA: Diagnosis not present

## 2023-10-27 DIAGNOSIS — Z5181 Encounter for therapeutic drug level monitoring: Secondary | ICD-10-CM | POA: Diagnosis not present

## 2023-10-27 DIAGNOSIS — N2581 Secondary hyperparathyroidism of renal origin: Secondary | ICD-10-CM | POA: Diagnosis not present

## 2023-10-27 DIAGNOSIS — T8249XD Other complication of vascular dialysis catheter, subsequent encounter: Secondary | ICD-10-CM | POA: Diagnosis not present

## 2023-10-27 DIAGNOSIS — D631 Anemia in chronic kidney disease: Secondary | ICD-10-CM | POA: Diagnosis not present

## 2023-10-27 DIAGNOSIS — Z992 Dependence on renal dialysis: Secondary | ICD-10-CM | POA: Diagnosis not present

## 2023-10-27 DIAGNOSIS — R197 Diarrhea, unspecified: Secondary | ICD-10-CM | POA: Diagnosis not present

## 2023-10-29 DIAGNOSIS — D631 Anemia in chronic kidney disease: Secondary | ICD-10-CM | POA: Diagnosis not present

## 2023-10-29 DIAGNOSIS — Z992 Dependence on renal dialysis: Secondary | ICD-10-CM | POA: Diagnosis not present

## 2023-10-29 DIAGNOSIS — N2581 Secondary hyperparathyroidism of renal origin: Secondary | ICD-10-CM | POA: Diagnosis not present

## 2023-10-29 DIAGNOSIS — T8249XD Other complication of vascular dialysis catheter, subsequent encounter: Secondary | ICD-10-CM | POA: Diagnosis not present

## 2023-10-29 DIAGNOSIS — E875 Hyperkalemia: Secondary | ICD-10-CM | POA: Diagnosis not present

## 2023-10-29 DIAGNOSIS — R197 Diarrhea, unspecified: Secondary | ICD-10-CM | POA: Diagnosis not present

## 2023-10-29 DIAGNOSIS — N186 End stage renal disease: Secondary | ICD-10-CM | POA: Diagnosis not present

## 2023-10-29 DIAGNOSIS — D689 Coagulation defect, unspecified: Secondary | ICD-10-CM | POA: Diagnosis not present

## 2023-10-29 DIAGNOSIS — Z5181 Encounter for therapeutic drug level monitoring: Secondary | ICD-10-CM | POA: Diagnosis not present

## 2023-11-01 ENCOUNTER — Telehealth: Payer: Self-pay

## 2023-11-01 DIAGNOSIS — Z992 Dependence on renal dialysis: Secondary | ICD-10-CM | POA: Diagnosis not present

## 2023-11-01 DIAGNOSIS — N2581 Secondary hyperparathyroidism of renal origin: Secondary | ICD-10-CM | POA: Diagnosis not present

## 2023-11-01 DIAGNOSIS — N186 End stage renal disease: Secondary | ICD-10-CM | POA: Diagnosis not present

## 2023-11-01 DIAGNOSIS — E875 Hyperkalemia: Secondary | ICD-10-CM | POA: Diagnosis not present

## 2023-11-01 DIAGNOSIS — R197 Diarrhea, unspecified: Secondary | ICD-10-CM | POA: Diagnosis not present

## 2023-11-01 DIAGNOSIS — Z5181 Encounter for therapeutic drug level monitoring: Secondary | ICD-10-CM | POA: Diagnosis not present

## 2023-11-01 DIAGNOSIS — D631 Anemia in chronic kidney disease: Secondary | ICD-10-CM | POA: Diagnosis not present

## 2023-11-01 DIAGNOSIS — D689 Coagulation defect, unspecified: Secondary | ICD-10-CM | POA: Diagnosis not present

## 2023-11-01 DIAGNOSIS — T8249XD Other complication of vascular dialysis catheter, subsequent encounter: Secondary | ICD-10-CM | POA: Diagnosis not present

## 2023-11-01 NOTE — Telephone Encounter (Signed)
Pt called stating that the pain in his L arm is now in a different place. He is requesting that his aneurysm surgery be moved up.  Reviewed pt's chart, returned call for clarification, no answer, no vm.

## 2023-11-03 DIAGNOSIS — N2581 Secondary hyperparathyroidism of renal origin: Secondary | ICD-10-CM | POA: Diagnosis not present

## 2023-11-03 DIAGNOSIS — R197 Diarrhea, unspecified: Secondary | ICD-10-CM | POA: Diagnosis not present

## 2023-11-03 DIAGNOSIS — D689 Coagulation defect, unspecified: Secondary | ICD-10-CM | POA: Diagnosis not present

## 2023-11-03 DIAGNOSIS — N186 End stage renal disease: Secondary | ICD-10-CM | POA: Diagnosis not present

## 2023-11-03 DIAGNOSIS — Z992 Dependence on renal dialysis: Secondary | ICD-10-CM | POA: Diagnosis not present

## 2023-11-03 DIAGNOSIS — E875 Hyperkalemia: Secondary | ICD-10-CM | POA: Diagnosis not present

## 2023-11-03 DIAGNOSIS — D631 Anemia in chronic kidney disease: Secondary | ICD-10-CM | POA: Diagnosis not present

## 2023-11-03 DIAGNOSIS — Z5181 Encounter for therapeutic drug level monitoring: Secondary | ICD-10-CM | POA: Diagnosis not present

## 2023-11-03 DIAGNOSIS — T8249XD Other complication of vascular dialysis catheter, subsequent encounter: Secondary | ICD-10-CM | POA: Diagnosis not present

## 2023-11-05 DIAGNOSIS — R197 Diarrhea, unspecified: Secondary | ICD-10-CM | POA: Diagnosis not present

## 2023-11-05 DIAGNOSIS — N186 End stage renal disease: Secondary | ICD-10-CM | POA: Diagnosis not present

## 2023-11-05 DIAGNOSIS — Z5181 Encounter for therapeutic drug level monitoring: Secondary | ICD-10-CM | POA: Diagnosis not present

## 2023-11-05 DIAGNOSIS — Z992 Dependence on renal dialysis: Secondary | ICD-10-CM | POA: Diagnosis not present

## 2023-11-05 DIAGNOSIS — T8249XD Other complication of vascular dialysis catheter, subsequent encounter: Secondary | ICD-10-CM | POA: Diagnosis not present

## 2023-11-05 DIAGNOSIS — E875 Hyperkalemia: Secondary | ICD-10-CM | POA: Diagnosis not present

## 2023-11-05 DIAGNOSIS — D689 Coagulation defect, unspecified: Secondary | ICD-10-CM | POA: Diagnosis not present

## 2023-11-05 DIAGNOSIS — D631 Anemia in chronic kidney disease: Secondary | ICD-10-CM | POA: Diagnosis not present

## 2023-11-05 DIAGNOSIS — N2581 Secondary hyperparathyroidism of renal origin: Secondary | ICD-10-CM | POA: Diagnosis not present

## 2023-11-07 DIAGNOSIS — R197 Diarrhea, unspecified: Secondary | ICD-10-CM | POA: Diagnosis not present

## 2023-11-07 DIAGNOSIS — N186 End stage renal disease: Secondary | ICD-10-CM | POA: Diagnosis not present

## 2023-11-07 DIAGNOSIS — T8249XD Other complication of vascular dialysis catheter, subsequent encounter: Secondary | ICD-10-CM | POA: Diagnosis not present

## 2023-11-07 DIAGNOSIS — D631 Anemia in chronic kidney disease: Secondary | ICD-10-CM | POA: Diagnosis not present

## 2023-11-07 DIAGNOSIS — E875 Hyperkalemia: Secondary | ICD-10-CM | POA: Diagnosis not present

## 2023-11-07 DIAGNOSIS — Z5181 Encounter for therapeutic drug level monitoring: Secondary | ICD-10-CM | POA: Diagnosis not present

## 2023-11-07 DIAGNOSIS — D689 Coagulation defect, unspecified: Secondary | ICD-10-CM | POA: Diagnosis not present

## 2023-11-07 DIAGNOSIS — N2581 Secondary hyperparathyroidism of renal origin: Secondary | ICD-10-CM | POA: Diagnosis not present

## 2023-11-07 DIAGNOSIS — Z992 Dependence on renal dialysis: Secondary | ICD-10-CM | POA: Diagnosis not present

## 2023-11-09 DIAGNOSIS — N186 End stage renal disease: Secondary | ICD-10-CM | POA: Diagnosis not present

## 2023-11-09 DIAGNOSIS — T8249XD Other complication of vascular dialysis catheter, subsequent encounter: Secondary | ICD-10-CM | POA: Diagnosis not present

## 2023-11-09 DIAGNOSIS — N2581 Secondary hyperparathyroidism of renal origin: Secondary | ICD-10-CM | POA: Diagnosis not present

## 2023-11-09 DIAGNOSIS — D631 Anemia in chronic kidney disease: Secondary | ICD-10-CM | POA: Diagnosis not present

## 2023-11-09 DIAGNOSIS — E875 Hyperkalemia: Secondary | ICD-10-CM | POA: Diagnosis not present

## 2023-11-09 DIAGNOSIS — R197 Diarrhea, unspecified: Secondary | ICD-10-CM | POA: Diagnosis not present

## 2023-11-09 DIAGNOSIS — Z992 Dependence on renal dialysis: Secondary | ICD-10-CM | POA: Diagnosis not present

## 2023-11-09 DIAGNOSIS — Z5181 Encounter for therapeutic drug level monitoring: Secondary | ICD-10-CM | POA: Diagnosis not present

## 2023-11-09 DIAGNOSIS — D689 Coagulation defect, unspecified: Secondary | ICD-10-CM | POA: Diagnosis not present

## 2023-11-12 DIAGNOSIS — N186 End stage renal disease: Secondary | ICD-10-CM | POA: Diagnosis not present

## 2023-11-12 DIAGNOSIS — Z5181 Encounter for therapeutic drug level monitoring: Secondary | ICD-10-CM | POA: Diagnosis not present

## 2023-11-12 DIAGNOSIS — T8249XD Other complication of vascular dialysis catheter, subsequent encounter: Secondary | ICD-10-CM | POA: Diagnosis not present

## 2023-11-12 DIAGNOSIS — N2581 Secondary hyperparathyroidism of renal origin: Secondary | ICD-10-CM | POA: Diagnosis not present

## 2023-11-12 DIAGNOSIS — E875 Hyperkalemia: Secondary | ICD-10-CM | POA: Diagnosis not present

## 2023-11-12 DIAGNOSIS — R197 Diarrhea, unspecified: Secondary | ICD-10-CM | POA: Diagnosis not present

## 2023-11-12 DIAGNOSIS — Z992 Dependence on renal dialysis: Secondary | ICD-10-CM | POA: Diagnosis not present

## 2023-11-12 DIAGNOSIS — D689 Coagulation defect, unspecified: Secondary | ICD-10-CM | POA: Diagnosis not present

## 2023-11-12 DIAGNOSIS — D631 Anemia in chronic kidney disease: Secondary | ICD-10-CM | POA: Diagnosis not present

## 2023-11-13 DIAGNOSIS — E875 Hyperkalemia: Secondary | ICD-10-CM | POA: Diagnosis not present

## 2023-11-13 DIAGNOSIS — N2581 Secondary hyperparathyroidism of renal origin: Secondary | ICD-10-CM | POA: Diagnosis not present

## 2023-11-13 DIAGNOSIS — Z5181 Encounter for therapeutic drug level monitoring: Secondary | ICD-10-CM | POA: Diagnosis not present

## 2023-11-13 DIAGNOSIS — T8610 Unspecified complication of kidney transplant: Secondary | ICD-10-CM | POA: Diagnosis not present

## 2023-11-13 DIAGNOSIS — T8249XD Other complication of vascular dialysis catheter, subsequent encounter: Secondary | ICD-10-CM | POA: Diagnosis not present

## 2023-11-13 DIAGNOSIS — Z992 Dependence on renal dialysis: Secondary | ICD-10-CM | POA: Diagnosis not present

## 2023-11-13 DIAGNOSIS — D631 Anemia in chronic kidney disease: Secondary | ICD-10-CM | POA: Diagnosis not present

## 2023-11-13 DIAGNOSIS — N186 End stage renal disease: Secondary | ICD-10-CM | POA: Diagnosis not present

## 2023-11-13 DIAGNOSIS — R197 Diarrhea, unspecified: Secondary | ICD-10-CM | POA: Diagnosis not present

## 2023-11-13 DIAGNOSIS — D689 Coagulation defect, unspecified: Secondary | ICD-10-CM | POA: Diagnosis not present

## 2023-11-15 DIAGNOSIS — Z5181 Encounter for therapeutic drug level monitoring: Secondary | ICD-10-CM | POA: Diagnosis not present

## 2023-11-15 DIAGNOSIS — D689 Coagulation defect, unspecified: Secondary | ICD-10-CM | POA: Diagnosis not present

## 2023-11-15 DIAGNOSIS — N2581 Secondary hyperparathyroidism of renal origin: Secondary | ICD-10-CM | POA: Diagnosis not present

## 2023-11-15 DIAGNOSIS — T8249XD Other complication of vascular dialysis catheter, subsequent encounter: Secondary | ICD-10-CM | POA: Diagnosis not present

## 2023-11-15 DIAGNOSIS — L299 Pruritus, unspecified: Secondary | ICD-10-CM | POA: Diagnosis not present

## 2023-11-15 DIAGNOSIS — D631 Anemia in chronic kidney disease: Secondary | ICD-10-CM | POA: Diagnosis not present

## 2023-11-15 DIAGNOSIS — N186 End stage renal disease: Secondary | ICD-10-CM | POA: Diagnosis not present

## 2023-11-15 DIAGNOSIS — Z992 Dependence on renal dialysis: Secondary | ICD-10-CM | POA: Diagnosis not present

## 2023-11-17 DIAGNOSIS — L299 Pruritus, unspecified: Secondary | ICD-10-CM | POA: Diagnosis not present

## 2023-11-17 DIAGNOSIS — D689 Coagulation defect, unspecified: Secondary | ICD-10-CM | POA: Diagnosis not present

## 2023-11-17 DIAGNOSIS — N2581 Secondary hyperparathyroidism of renal origin: Secondary | ICD-10-CM | POA: Diagnosis not present

## 2023-11-17 DIAGNOSIS — T8249XD Other complication of vascular dialysis catheter, subsequent encounter: Secondary | ICD-10-CM | POA: Diagnosis not present

## 2023-11-17 DIAGNOSIS — N186 End stage renal disease: Secondary | ICD-10-CM | POA: Diagnosis not present

## 2023-11-17 DIAGNOSIS — Z992 Dependence on renal dialysis: Secondary | ICD-10-CM | POA: Diagnosis not present

## 2023-11-17 DIAGNOSIS — Z5181 Encounter for therapeutic drug level monitoring: Secondary | ICD-10-CM | POA: Diagnosis not present

## 2023-11-17 DIAGNOSIS — D631 Anemia in chronic kidney disease: Secondary | ICD-10-CM | POA: Diagnosis not present

## 2023-11-19 DIAGNOSIS — N2581 Secondary hyperparathyroidism of renal origin: Secondary | ICD-10-CM | POA: Diagnosis not present

## 2023-11-19 DIAGNOSIS — D689 Coagulation defect, unspecified: Secondary | ICD-10-CM | POA: Diagnosis not present

## 2023-11-19 DIAGNOSIS — N186 End stage renal disease: Secondary | ICD-10-CM | POA: Diagnosis not present

## 2023-11-19 DIAGNOSIS — D631 Anemia in chronic kidney disease: Secondary | ICD-10-CM | POA: Diagnosis not present

## 2023-11-19 DIAGNOSIS — T8249XD Other complication of vascular dialysis catheter, subsequent encounter: Secondary | ICD-10-CM | POA: Diagnosis not present

## 2023-11-19 DIAGNOSIS — Z5181 Encounter for therapeutic drug level monitoring: Secondary | ICD-10-CM | POA: Diagnosis not present

## 2023-11-19 DIAGNOSIS — L299 Pruritus, unspecified: Secondary | ICD-10-CM | POA: Diagnosis not present

## 2023-11-19 DIAGNOSIS — Z992 Dependence on renal dialysis: Secondary | ICD-10-CM | POA: Diagnosis not present

## 2023-11-22 DIAGNOSIS — D631 Anemia in chronic kidney disease: Secondary | ICD-10-CM | POA: Diagnosis not present

## 2023-11-22 DIAGNOSIS — T8249XD Other complication of vascular dialysis catheter, subsequent encounter: Secondary | ICD-10-CM | POA: Diagnosis not present

## 2023-11-22 DIAGNOSIS — N2581 Secondary hyperparathyroidism of renal origin: Secondary | ICD-10-CM | POA: Diagnosis not present

## 2023-11-22 DIAGNOSIS — N186 End stage renal disease: Secondary | ICD-10-CM | POA: Diagnosis not present

## 2023-11-22 DIAGNOSIS — D689 Coagulation defect, unspecified: Secondary | ICD-10-CM | POA: Diagnosis not present

## 2023-11-22 DIAGNOSIS — L299 Pruritus, unspecified: Secondary | ICD-10-CM | POA: Diagnosis not present

## 2023-11-22 DIAGNOSIS — Z5181 Encounter for therapeutic drug level monitoring: Secondary | ICD-10-CM | POA: Diagnosis not present

## 2023-11-22 DIAGNOSIS — Z992 Dependence on renal dialysis: Secondary | ICD-10-CM | POA: Diagnosis not present

## 2023-11-24 DIAGNOSIS — T8249XD Other complication of vascular dialysis catheter, subsequent encounter: Secondary | ICD-10-CM | POA: Diagnosis not present

## 2023-11-24 DIAGNOSIS — Z5181 Encounter for therapeutic drug level monitoring: Secondary | ICD-10-CM | POA: Diagnosis not present

## 2023-11-24 DIAGNOSIS — L299 Pruritus, unspecified: Secondary | ICD-10-CM | POA: Diagnosis not present

## 2023-11-24 DIAGNOSIS — N2581 Secondary hyperparathyroidism of renal origin: Secondary | ICD-10-CM | POA: Diagnosis not present

## 2023-11-24 DIAGNOSIS — D689 Coagulation defect, unspecified: Secondary | ICD-10-CM | POA: Diagnosis not present

## 2023-11-24 DIAGNOSIS — Z992 Dependence on renal dialysis: Secondary | ICD-10-CM | POA: Diagnosis not present

## 2023-11-24 DIAGNOSIS — N186 End stage renal disease: Secondary | ICD-10-CM | POA: Diagnosis not present

## 2023-11-24 DIAGNOSIS — D631 Anemia in chronic kidney disease: Secondary | ICD-10-CM | POA: Diagnosis not present

## 2023-11-26 DIAGNOSIS — N186 End stage renal disease: Secondary | ICD-10-CM | POA: Diagnosis not present

## 2023-11-26 DIAGNOSIS — D689 Coagulation defect, unspecified: Secondary | ICD-10-CM | POA: Diagnosis not present

## 2023-11-26 DIAGNOSIS — Z5181 Encounter for therapeutic drug level monitoring: Secondary | ICD-10-CM | POA: Diagnosis not present

## 2023-11-26 DIAGNOSIS — Z992 Dependence on renal dialysis: Secondary | ICD-10-CM | POA: Diagnosis not present

## 2023-11-26 DIAGNOSIS — D631 Anemia in chronic kidney disease: Secondary | ICD-10-CM | POA: Diagnosis not present

## 2023-11-26 DIAGNOSIS — L299 Pruritus, unspecified: Secondary | ICD-10-CM | POA: Diagnosis not present

## 2023-11-26 DIAGNOSIS — T8249XD Other complication of vascular dialysis catheter, subsequent encounter: Secondary | ICD-10-CM | POA: Diagnosis not present

## 2023-11-26 DIAGNOSIS — N2581 Secondary hyperparathyroidism of renal origin: Secondary | ICD-10-CM | POA: Diagnosis not present

## 2023-11-29 DIAGNOSIS — D631 Anemia in chronic kidney disease: Secondary | ICD-10-CM | POA: Diagnosis not present

## 2023-11-29 DIAGNOSIS — N2581 Secondary hyperparathyroidism of renal origin: Secondary | ICD-10-CM | POA: Diagnosis not present

## 2023-11-29 DIAGNOSIS — T8249XD Other complication of vascular dialysis catheter, subsequent encounter: Secondary | ICD-10-CM | POA: Diagnosis not present

## 2023-11-29 DIAGNOSIS — D689 Coagulation defect, unspecified: Secondary | ICD-10-CM | POA: Diagnosis not present

## 2023-11-29 DIAGNOSIS — L299 Pruritus, unspecified: Secondary | ICD-10-CM | POA: Diagnosis not present

## 2023-11-29 DIAGNOSIS — N186 End stage renal disease: Secondary | ICD-10-CM | POA: Diagnosis not present

## 2023-11-29 DIAGNOSIS — Z5181 Encounter for therapeutic drug level monitoring: Secondary | ICD-10-CM | POA: Diagnosis not present

## 2023-11-29 DIAGNOSIS — Z992 Dependence on renal dialysis: Secondary | ICD-10-CM | POA: Diagnosis not present

## 2023-12-01 DIAGNOSIS — T8249XD Other complication of vascular dialysis catheter, subsequent encounter: Secondary | ICD-10-CM | POA: Diagnosis not present

## 2023-12-01 DIAGNOSIS — Z5181 Encounter for therapeutic drug level monitoring: Secondary | ICD-10-CM | POA: Diagnosis not present

## 2023-12-01 DIAGNOSIS — L299 Pruritus, unspecified: Secondary | ICD-10-CM | POA: Diagnosis not present

## 2023-12-01 DIAGNOSIS — N186 End stage renal disease: Secondary | ICD-10-CM | POA: Diagnosis not present

## 2023-12-01 DIAGNOSIS — D689 Coagulation defect, unspecified: Secondary | ICD-10-CM | POA: Diagnosis not present

## 2023-12-01 DIAGNOSIS — N2581 Secondary hyperparathyroidism of renal origin: Secondary | ICD-10-CM | POA: Diagnosis not present

## 2023-12-01 DIAGNOSIS — D631 Anemia in chronic kidney disease: Secondary | ICD-10-CM | POA: Diagnosis not present

## 2023-12-01 DIAGNOSIS — Z992 Dependence on renal dialysis: Secondary | ICD-10-CM | POA: Diagnosis not present

## 2023-12-03 DIAGNOSIS — T8249XD Other complication of vascular dialysis catheter, subsequent encounter: Secondary | ICD-10-CM | POA: Diagnosis not present

## 2023-12-03 DIAGNOSIS — Z5181 Encounter for therapeutic drug level monitoring: Secondary | ICD-10-CM | POA: Diagnosis not present

## 2023-12-03 DIAGNOSIS — D631 Anemia in chronic kidney disease: Secondary | ICD-10-CM | POA: Diagnosis not present

## 2023-12-03 DIAGNOSIS — D689 Coagulation defect, unspecified: Secondary | ICD-10-CM | POA: Diagnosis not present

## 2023-12-03 DIAGNOSIS — Z992 Dependence on renal dialysis: Secondary | ICD-10-CM | POA: Diagnosis not present

## 2023-12-03 DIAGNOSIS — N2581 Secondary hyperparathyroidism of renal origin: Secondary | ICD-10-CM | POA: Diagnosis not present

## 2023-12-03 DIAGNOSIS — N186 End stage renal disease: Secondary | ICD-10-CM | POA: Diagnosis not present

## 2023-12-03 DIAGNOSIS — L299 Pruritus, unspecified: Secondary | ICD-10-CM | POA: Diagnosis not present

## 2023-12-05 DIAGNOSIS — D631 Anemia in chronic kidney disease: Secondary | ICD-10-CM | POA: Diagnosis not present

## 2023-12-05 DIAGNOSIS — N2581 Secondary hyperparathyroidism of renal origin: Secondary | ICD-10-CM | POA: Diagnosis not present

## 2023-12-05 DIAGNOSIS — D689 Coagulation defect, unspecified: Secondary | ICD-10-CM | POA: Diagnosis not present

## 2023-12-05 DIAGNOSIS — Z992 Dependence on renal dialysis: Secondary | ICD-10-CM | POA: Diagnosis not present

## 2023-12-05 DIAGNOSIS — N186 End stage renal disease: Secondary | ICD-10-CM | POA: Diagnosis not present

## 2023-12-05 DIAGNOSIS — L299 Pruritus, unspecified: Secondary | ICD-10-CM | POA: Diagnosis not present

## 2023-12-05 DIAGNOSIS — T8249XD Other complication of vascular dialysis catheter, subsequent encounter: Secondary | ICD-10-CM | POA: Diagnosis not present

## 2023-12-05 DIAGNOSIS — Z5181 Encounter for therapeutic drug level monitoring: Secondary | ICD-10-CM | POA: Diagnosis not present

## 2023-12-07 DIAGNOSIS — D631 Anemia in chronic kidney disease: Secondary | ICD-10-CM | POA: Diagnosis not present

## 2023-12-07 DIAGNOSIS — N2581 Secondary hyperparathyroidism of renal origin: Secondary | ICD-10-CM | POA: Diagnosis not present

## 2023-12-07 DIAGNOSIS — Z5181 Encounter for therapeutic drug level monitoring: Secondary | ICD-10-CM | POA: Diagnosis not present

## 2023-12-07 DIAGNOSIS — N186 End stage renal disease: Secondary | ICD-10-CM | POA: Diagnosis not present

## 2023-12-07 DIAGNOSIS — L299 Pruritus, unspecified: Secondary | ICD-10-CM | POA: Diagnosis not present

## 2023-12-07 DIAGNOSIS — D689 Coagulation defect, unspecified: Secondary | ICD-10-CM | POA: Diagnosis not present

## 2023-12-07 DIAGNOSIS — T8249XD Other complication of vascular dialysis catheter, subsequent encounter: Secondary | ICD-10-CM | POA: Diagnosis not present

## 2023-12-07 DIAGNOSIS — Z992 Dependence on renal dialysis: Secondary | ICD-10-CM | POA: Diagnosis not present

## 2023-12-10 DIAGNOSIS — D631 Anemia in chronic kidney disease: Secondary | ICD-10-CM | POA: Diagnosis not present

## 2023-12-10 DIAGNOSIS — L299 Pruritus, unspecified: Secondary | ICD-10-CM | POA: Diagnosis not present

## 2023-12-10 DIAGNOSIS — N186 End stage renal disease: Secondary | ICD-10-CM | POA: Diagnosis not present

## 2023-12-10 DIAGNOSIS — T8249XD Other complication of vascular dialysis catheter, subsequent encounter: Secondary | ICD-10-CM | POA: Diagnosis not present

## 2023-12-10 DIAGNOSIS — Z992 Dependence on renal dialysis: Secondary | ICD-10-CM | POA: Diagnosis not present

## 2023-12-10 DIAGNOSIS — N2581 Secondary hyperparathyroidism of renal origin: Secondary | ICD-10-CM | POA: Diagnosis not present

## 2023-12-10 DIAGNOSIS — Z5181 Encounter for therapeutic drug level monitoring: Secondary | ICD-10-CM | POA: Diagnosis not present

## 2023-12-10 DIAGNOSIS — D689 Coagulation defect, unspecified: Secondary | ICD-10-CM | POA: Diagnosis not present

## 2023-12-12 DIAGNOSIS — D689 Coagulation defect, unspecified: Secondary | ICD-10-CM | POA: Diagnosis not present

## 2023-12-12 DIAGNOSIS — D631 Anemia in chronic kidney disease: Secondary | ICD-10-CM | POA: Diagnosis not present

## 2023-12-12 DIAGNOSIS — N186 End stage renal disease: Secondary | ICD-10-CM | POA: Diagnosis not present

## 2023-12-12 DIAGNOSIS — T8249XD Other complication of vascular dialysis catheter, subsequent encounter: Secondary | ICD-10-CM | POA: Diagnosis not present

## 2023-12-12 DIAGNOSIS — Z992 Dependence on renal dialysis: Secondary | ICD-10-CM | POA: Diagnosis not present

## 2023-12-12 DIAGNOSIS — N2581 Secondary hyperparathyroidism of renal origin: Secondary | ICD-10-CM | POA: Diagnosis not present

## 2023-12-12 DIAGNOSIS — Z5181 Encounter for therapeutic drug level monitoring: Secondary | ICD-10-CM | POA: Diagnosis not present

## 2023-12-12 DIAGNOSIS — L299 Pruritus, unspecified: Secondary | ICD-10-CM | POA: Diagnosis not present

## 2023-12-14 DIAGNOSIS — N2581 Secondary hyperparathyroidism of renal origin: Secondary | ICD-10-CM | POA: Diagnosis not present

## 2023-12-14 DIAGNOSIS — L299 Pruritus, unspecified: Secondary | ICD-10-CM | POA: Diagnosis not present

## 2023-12-14 DIAGNOSIS — D689 Coagulation defect, unspecified: Secondary | ICD-10-CM | POA: Diagnosis not present

## 2023-12-14 DIAGNOSIS — T8249XD Other complication of vascular dialysis catheter, subsequent encounter: Secondary | ICD-10-CM | POA: Diagnosis not present

## 2023-12-14 DIAGNOSIS — D631 Anemia in chronic kidney disease: Secondary | ICD-10-CM | POA: Diagnosis not present

## 2023-12-14 DIAGNOSIS — N186 End stage renal disease: Secondary | ICD-10-CM | POA: Diagnosis not present

## 2023-12-14 DIAGNOSIS — Z992 Dependence on renal dialysis: Secondary | ICD-10-CM | POA: Diagnosis not present

## 2023-12-14 DIAGNOSIS — T8610 Unspecified complication of kidney transplant: Secondary | ICD-10-CM | POA: Diagnosis not present

## 2023-12-14 DIAGNOSIS — Z5181 Encounter for therapeutic drug level monitoring: Secondary | ICD-10-CM | POA: Diagnosis not present

## 2023-12-16 ENCOUNTER — Ambulatory Visit (INDEPENDENT_AMBULATORY_CARE_PROVIDER_SITE_OTHER): Payer: Medicare Other | Admitting: Physician Assistant

## 2023-12-16 VITALS — BP 111/72 | HR 62 | Temp 97.4°F | Resp 20 | Ht 70.0 in | Wt 225.0 lb

## 2023-12-16 DIAGNOSIS — N186 End stage renal disease: Secondary | ICD-10-CM | POA: Diagnosis not present

## 2023-12-16 DIAGNOSIS — Z992 Dependence on renal dialysis: Secondary | ICD-10-CM | POA: Diagnosis not present

## 2023-12-16 DIAGNOSIS — T82898D Other specified complication of vascular prosthetic devices, implants and grafts, subsequent encounter: Secondary | ICD-10-CM | POA: Diagnosis not present

## 2023-12-17 DIAGNOSIS — D689 Coagulation defect, unspecified: Secondary | ICD-10-CM | POA: Diagnosis not present

## 2023-12-17 DIAGNOSIS — N2581 Secondary hyperparathyroidism of renal origin: Secondary | ICD-10-CM | POA: Diagnosis not present

## 2023-12-17 DIAGNOSIS — Z992 Dependence on renal dialysis: Secondary | ICD-10-CM | POA: Diagnosis not present

## 2023-12-17 DIAGNOSIS — D631 Anemia in chronic kidney disease: Secondary | ICD-10-CM | POA: Diagnosis not present

## 2023-12-17 DIAGNOSIS — T8249XD Other complication of vascular dialysis catheter, subsequent encounter: Secondary | ICD-10-CM | POA: Diagnosis not present

## 2023-12-17 DIAGNOSIS — N186 End stage renal disease: Secondary | ICD-10-CM | POA: Diagnosis not present

## 2023-12-17 DIAGNOSIS — Z5181 Encounter for therapeutic drug level monitoring: Secondary | ICD-10-CM | POA: Diagnosis not present

## 2023-12-20 DIAGNOSIS — T8249XD Other complication of vascular dialysis catheter, subsequent encounter: Secondary | ICD-10-CM | POA: Diagnosis not present

## 2023-12-20 DIAGNOSIS — N186 End stage renal disease: Secondary | ICD-10-CM | POA: Diagnosis not present

## 2023-12-20 DIAGNOSIS — N2581 Secondary hyperparathyroidism of renal origin: Secondary | ICD-10-CM | POA: Diagnosis not present

## 2023-12-20 DIAGNOSIS — D689 Coagulation defect, unspecified: Secondary | ICD-10-CM | POA: Diagnosis not present

## 2023-12-20 DIAGNOSIS — D631 Anemia in chronic kidney disease: Secondary | ICD-10-CM | POA: Diagnosis not present

## 2023-12-20 DIAGNOSIS — Z5181 Encounter for therapeutic drug level monitoring: Secondary | ICD-10-CM | POA: Diagnosis not present

## 2023-12-20 DIAGNOSIS — Z992 Dependence on renal dialysis: Secondary | ICD-10-CM | POA: Diagnosis not present

## 2023-12-20 NOTE — Progress Notes (Signed)
 Office Note   History of Present Illness   Tim Baker is a 48 y.o. ([birth year]) male who presents for follow up for dialysis access. He recently underwent left basilic vein transposition on 09/07/2023 by Dr.Dickson. He was cleared to use his fistula by 12/07/2023.   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 Prior to his basilic fistula, he used his forearm fistula for years. This occluded mid-last year. Over time he developed two large aneurysmal areas. He would like to get these aneurysms excised. He states these aneurysms have slowly gotten larger over time and more painful. Previously Dr.Dickson was open to excision but wanted him to heal from his previous surgery before undergoing this one.  Current Outpatient Medications  Medication Sig Dispense Refill   amLODipine  (NORVASC ) 10 MG tablet Take 10 mg by mouth at bedtime.      AURYXIA 1 GM 210 MG(Fe) tablet Take 630 mg by mouth with breakfast, with lunch, and with evening meal. Take 210 mg with snack     cetirizine (ZYRTEC) 10 MG tablet Take 10 mg by mouth daily.     cinacalcet  (SENSIPAR ) 90 MG tablet Take 90 mg by mouth daily.     doxazosin (CARDURA) 1 MG tablet Take 1 mg by mouth daily.     labetalol  (NORMODYNE ) 300 MG tablet Take 300 mg by mouth 2 (two) times daily.     Multiple Vitamins-Minerals (RENAPLEX) TABS Take 1 tablet by mouth daily.     omeprazole (PRILOSEC) 20 MG capsule Take 20 mg by mouth daily.     oxyCODONE -acetaminophen  (PERCOCET) 5-325 MG tablet Take 1 tablet by mouth every 6 (six) hours as needed for severe pain. (Patient not taking: Reported on 12/16/2023) 20 tablet 0   No current facility-administered medications for this visit.    REVIEW OF SYSTEMS (negative unless checked):   Cardiac:  []  Chest pain or chest pressure? []  Shortness of breath upon activity? []  Shortness of breath when lying flat? []  Irregular heart rhythm?  Vascular:  []  Pain in calf, thigh, or hip  brought on by walking? []  Pain in feet at night that wakes you up from your sleep? []  Blood clot in your veins? []  Leg swelling?  Pulmonary:  []  Oxygen at home? []  Productive cough? []  Wheezing?  Neurologic:  []  Sudden weakness in arms or legs? []  Sudden numbness in arms or legs? []  Sudden onset of difficult speaking or slurred speech? []  Temporary loss of vision in one eye? []  Problems with dizziness?  Gastrointestinal:  []  Blood in stool? []  Vomited blood?  Genitourinary:  []  Burning when urinating? []  Blood in urine?  Psychiatric:  []  Major depression  Hematologic:  []  Bleeding problems? []  Problems with blood clotting?  Dermatologic:  []  Rashes or ulcers?  Constitutional:  []  Fever or chills?  Ear/Nose/Throat:  []  Change in hearing? []  Nose bleeds? []  Sore throat?  Musculoskeletal:  []  Back pain? []  Joint pain? []  Muscle pain?   Physical Examination   Vitals:   12/16/23 1007  BP: 111/72  Pulse: 62  Resp: 20  Temp: (!) 97.4 F (36.3 C)  TempSrc: Temporal  SpO2: 98%  Weight: 225 lb (102.1 kg)  Height: 5' 10 (1.778 m)   Body mass index is 32.28 kg/m.  General:  WDWN in NAD; vital signs documented above Gait: Not observed HENT: WNL, normocephalic Pulmonary: normal non-labored breathing , without rales, rhonchi,  wheezing Cardiac: regular Abdomen: soft, NT, no masses Skin: without rashes  Vascular Exam/Pulses: palpable left radial pulse Extremities: occluded left radiocephalic fistula with 2 large aneurysmal areas with associated skin thinning. Patent left upper arm fistula with good thrill Musculoskeletal: no muscle wasting or atrophy  Neurologic: A&O X 3;  No focal weakness or paresthesias are detected Psychiatric:  The pt has Normal affect.     Medical Decision Making   Tim Baker is a 48 y.o. male who presents for evaluation of aneurysmal left forearm fistula  The patient has ESRD and currently dialyzes via [facility]. He does  have a patent left basilic vein fistula, which was recently cleared for use. He has not used this fistula yet. He has a left forearm fistula that was previously used for years. This occluded sometime mid-last year. Over time the patient developed two large aneurysms to this fistula without previous skin compromise.  He states the aneurysms continue to slowly enlarge even after fistula occlusion. These have started to cause him pain and discomfort.  On exam he has two large aneurysms to a left radiocephalic fistula. This is occluded. There is evidence of skin thinning overlying the fistula. His skin also cannot be mobilized. He would benefit from excision. I discussed this case with Dr.Robins given the size of these aneurysms. We have discussed with the patient that we may not be able to mobilize enough of his skin post-excision for primary closure. The patient is aware he may require a wound vac to help with skin closure. He would still like to proceed.  The patient has dialysis on MWF. We can schedule him for aneurysm excision with possible wound vac placement on a Tuesday in the next couple of weeks.   Tim Holster PA-C [facility] [place] Office: [phone]  Clinic MD: [place]

## 2023-12-22 DIAGNOSIS — N2581 Secondary hyperparathyroidism of renal origin: Secondary | ICD-10-CM | POA: Diagnosis not present

## 2023-12-22 DIAGNOSIS — Z992 Dependence on renal dialysis: Secondary | ICD-10-CM | POA: Diagnosis not present

## 2023-12-22 DIAGNOSIS — D689 Coagulation defect, unspecified: Secondary | ICD-10-CM | POA: Diagnosis not present

## 2023-12-22 DIAGNOSIS — N186 End stage renal disease: Secondary | ICD-10-CM | POA: Diagnosis not present

## 2023-12-22 DIAGNOSIS — T8249XD Other complication of vascular dialysis catheter, subsequent encounter: Secondary | ICD-10-CM | POA: Diagnosis not present

## 2023-12-22 DIAGNOSIS — Z5181 Encounter for therapeutic drug level monitoring: Secondary | ICD-10-CM | POA: Diagnosis not present

## 2023-12-22 DIAGNOSIS — D631 Anemia in chronic kidney disease: Secondary | ICD-10-CM | POA: Diagnosis not present

## 2023-12-23 DIAGNOSIS — R5383 Other fatigue: Secondary | ICD-10-CM | POA: Diagnosis not present

## 2023-12-24 DIAGNOSIS — N2581 Secondary hyperparathyroidism of renal origin: Secondary | ICD-10-CM | POA: Diagnosis not present

## 2023-12-24 DIAGNOSIS — T8249XD Other complication of vascular dialysis catheter, subsequent encounter: Secondary | ICD-10-CM | POA: Diagnosis not present

## 2023-12-24 DIAGNOSIS — D689 Coagulation defect, unspecified: Secondary | ICD-10-CM | POA: Diagnosis not present

## 2023-12-24 DIAGNOSIS — Z5181 Encounter for therapeutic drug level monitoring: Secondary | ICD-10-CM | POA: Diagnosis not present

## 2023-12-24 DIAGNOSIS — N186 End stage renal disease: Secondary | ICD-10-CM | POA: Diagnosis not present

## 2023-12-24 DIAGNOSIS — D631 Anemia in chronic kidney disease: Secondary | ICD-10-CM | POA: Diagnosis not present

## 2023-12-24 DIAGNOSIS — Z992 Dependence on renal dialysis: Secondary | ICD-10-CM | POA: Diagnosis not present

## 2023-12-27 DIAGNOSIS — N2581 Secondary hyperparathyroidism of renal origin: Secondary | ICD-10-CM | POA: Diagnosis not present

## 2023-12-27 DIAGNOSIS — D631 Anemia in chronic kidney disease: Secondary | ICD-10-CM | POA: Diagnosis not present

## 2023-12-27 DIAGNOSIS — D689 Coagulation defect, unspecified: Secondary | ICD-10-CM | POA: Diagnosis not present

## 2023-12-27 DIAGNOSIS — T8249XD Other complication of vascular dialysis catheter, subsequent encounter: Secondary | ICD-10-CM | POA: Diagnosis not present

## 2023-12-27 DIAGNOSIS — N186 End stage renal disease: Secondary | ICD-10-CM | POA: Diagnosis not present

## 2023-12-27 DIAGNOSIS — Z5181 Encounter for therapeutic drug level monitoring: Secondary | ICD-10-CM | POA: Diagnosis not present

## 2023-12-27 DIAGNOSIS — Z992 Dependence on renal dialysis: Secondary | ICD-10-CM | POA: Diagnosis not present

## 2023-12-29 DIAGNOSIS — Z5181 Encounter for therapeutic drug level monitoring: Secondary | ICD-10-CM | POA: Diagnosis not present

## 2023-12-29 DIAGNOSIS — T8249XD Other complication of vascular dialysis catheter, subsequent encounter: Secondary | ICD-10-CM | POA: Diagnosis not present

## 2023-12-29 DIAGNOSIS — D689 Coagulation defect, unspecified: Secondary | ICD-10-CM | POA: Diagnosis not present

## 2023-12-29 DIAGNOSIS — D631 Anemia in chronic kidney disease: Secondary | ICD-10-CM | POA: Diagnosis not present

## 2023-12-29 DIAGNOSIS — N2581 Secondary hyperparathyroidism of renal origin: Secondary | ICD-10-CM | POA: Diagnosis not present

## 2023-12-29 DIAGNOSIS — Z992 Dependence on renal dialysis: Secondary | ICD-10-CM | POA: Diagnosis not present

## 2023-12-29 DIAGNOSIS — N186 End stage renal disease: Secondary | ICD-10-CM | POA: Diagnosis not present

## 2023-12-31 ENCOUNTER — Other Ambulatory Visit: Payer: Self-pay | Admitting: *Deleted

## 2023-12-31 DIAGNOSIS — D689 Coagulation defect, unspecified: Secondary | ICD-10-CM | POA: Diagnosis not present

## 2023-12-31 DIAGNOSIS — Z5181 Encounter for therapeutic drug level monitoring: Secondary | ICD-10-CM | POA: Diagnosis not present

## 2023-12-31 DIAGNOSIS — T8249XD Other complication of vascular dialysis catheter, subsequent encounter: Secondary | ICD-10-CM | POA: Diagnosis not present

## 2023-12-31 DIAGNOSIS — N186 End stage renal disease: Secondary | ICD-10-CM | POA: Diagnosis not present

## 2023-12-31 DIAGNOSIS — N2581 Secondary hyperparathyroidism of renal origin: Secondary | ICD-10-CM | POA: Diagnosis not present

## 2023-12-31 DIAGNOSIS — D631 Anemia in chronic kidney disease: Secondary | ICD-10-CM | POA: Diagnosis not present

## 2023-12-31 DIAGNOSIS — Z992 Dependence on renal dialysis: Secondary | ICD-10-CM | POA: Diagnosis not present

## 2023-12-31 DIAGNOSIS — T82898D Other specified complication of vascular prosthetic devices, implants and grafts, subsequent encounter: Secondary | ICD-10-CM

## 2024-01-03 DIAGNOSIS — T8249XD Other complication of vascular dialysis catheter, subsequent encounter: Secondary | ICD-10-CM | POA: Diagnosis not present

## 2024-01-03 DIAGNOSIS — N186 End stage renal disease: Secondary | ICD-10-CM | POA: Diagnosis not present

## 2024-01-03 DIAGNOSIS — D689 Coagulation defect, unspecified: Secondary | ICD-10-CM | POA: Diagnosis not present

## 2024-01-03 DIAGNOSIS — D631 Anemia in chronic kidney disease: Secondary | ICD-10-CM | POA: Diagnosis not present

## 2024-01-03 DIAGNOSIS — Z5181 Encounter for therapeutic drug level monitoring: Secondary | ICD-10-CM | POA: Diagnosis not present

## 2024-01-03 DIAGNOSIS — Z992 Dependence on renal dialysis: Secondary | ICD-10-CM | POA: Diagnosis not present

## 2024-01-03 DIAGNOSIS — N2581 Secondary hyperparathyroidism of renal origin: Secondary | ICD-10-CM | POA: Diagnosis not present

## 2024-01-05 DIAGNOSIS — D689 Coagulation defect, unspecified: Secondary | ICD-10-CM | POA: Diagnosis not present

## 2024-01-05 DIAGNOSIS — D631 Anemia in chronic kidney disease: Secondary | ICD-10-CM | POA: Diagnosis not present

## 2024-01-05 DIAGNOSIS — T8249XD Other complication of vascular dialysis catheter, subsequent encounter: Secondary | ICD-10-CM | POA: Diagnosis not present

## 2024-01-05 DIAGNOSIS — Z5181 Encounter for therapeutic drug level monitoring: Secondary | ICD-10-CM | POA: Diagnosis not present

## 2024-01-05 DIAGNOSIS — N2581 Secondary hyperparathyroidism of renal origin: Secondary | ICD-10-CM | POA: Diagnosis not present

## 2024-01-05 DIAGNOSIS — Z992 Dependence on renal dialysis: Secondary | ICD-10-CM | POA: Diagnosis not present

## 2024-01-05 DIAGNOSIS — N186 End stage renal disease: Secondary | ICD-10-CM | POA: Diagnosis not present

## 2024-01-07 DIAGNOSIS — N2581 Secondary hyperparathyroidism of renal origin: Secondary | ICD-10-CM | POA: Diagnosis not present

## 2024-01-07 DIAGNOSIS — D631 Anemia in chronic kidney disease: Secondary | ICD-10-CM | POA: Diagnosis not present

## 2024-01-07 DIAGNOSIS — T8249XD Other complication of vascular dialysis catheter, subsequent encounter: Secondary | ICD-10-CM | POA: Diagnosis not present

## 2024-01-07 DIAGNOSIS — N186 End stage renal disease: Secondary | ICD-10-CM | POA: Diagnosis not present

## 2024-01-07 DIAGNOSIS — Z5181 Encounter for therapeutic drug level monitoring: Secondary | ICD-10-CM | POA: Diagnosis not present

## 2024-01-07 DIAGNOSIS — Z992 Dependence on renal dialysis: Secondary | ICD-10-CM | POA: Diagnosis not present

## 2024-01-07 DIAGNOSIS — D689 Coagulation defect, unspecified: Secondary | ICD-10-CM | POA: Diagnosis not present

## 2024-01-10 ENCOUNTER — Other Ambulatory Visit: Payer: Self-pay

## 2024-01-10 ENCOUNTER — Encounter (HOSPITAL_COMMUNITY): Payer: Self-pay | Admitting: Vascular Surgery

## 2024-01-10 DIAGNOSIS — Z5181 Encounter for therapeutic drug level monitoring: Secondary | ICD-10-CM | POA: Diagnosis not present

## 2024-01-10 DIAGNOSIS — D689 Coagulation defect, unspecified: Secondary | ICD-10-CM | POA: Diagnosis not present

## 2024-01-10 DIAGNOSIS — D631 Anemia in chronic kidney disease: Secondary | ICD-10-CM | POA: Diagnosis not present

## 2024-01-10 DIAGNOSIS — N2581 Secondary hyperparathyroidism of renal origin: Secondary | ICD-10-CM | POA: Diagnosis not present

## 2024-01-10 DIAGNOSIS — Z992 Dependence on renal dialysis: Secondary | ICD-10-CM | POA: Diagnosis not present

## 2024-01-10 DIAGNOSIS — N186 End stage renal disease: Secondary | ICD-10-CM | POA: Diagnosis not present

## 2024-01-10 DIAGNOSIS — T8249XD Other complication of vascular dialysis catheter, subsequent encounter: Secondary | ICD-10-CM | POA: Diagnosis not present

## 2024-01-10 NOTE — Progress Notes (Signed)
PCP - Renaye Rakers, MD  Cardiologist -   PPM/ICD - denies Device Orders - n/a Rep Notified - n/a  Chest x-ray -  EKG - 09-08-23 Stress Test - 02-03-22 ECHO - 03-20-22 Cardiac Cath - 03-20-22  CPAP - denies  DM -denies  Blood Thinner Instructions: denies Aspirin Instructions: n/a  ERAS Protcol - NPO  COVID TEST- n/a  Anesthesia review: Yes, ESRD, heart murmur (per patient not always detected)  Patient verbally denies any shortness of breath, fever, cough and chest pain during phone call   -------------  SDW INSTRUCTIONS given:  Your procedure is scheduled on January 28,2025.  Report to [facility] Main Entrance "A" at 6:50 A.M., and check in at the Admitting office.  Call this number if you have problems the morning of surgery:  [phone]   Remember:  Do not eat or drink after midnight the night before your surgery      Take these medicines the morning of surgery with A SIP OF WATER  doxazosin (CARDURA)   As of today, STOP taking any Aspirin (unless otherwise instructed by your surgeon) Aleve, Naproxen, Ibuprofen, Motrin, Advil, Goody's, BC's, all herbal medications, fish oil, and all vitamins.                      Do not wear jewelry, make up, or nail polish            Do not wear lotions, powders, perfumes/colognes, or deodorant.            Do not shave 48 hours prior to surgery.  Men may shave face and neck.            Do not bring valuables to the hospital.            [facility] is not responsible for any belongings or valuables.  Do NOT Smoke (Tobacco/Vaping) 24 hours prior to your procedure If you use a CPAP at night, you may bring all equipment for your overnight stay.   Contacts, glasses, dentures or bridgework may not be worn into surgery.      For patients admitted to the hospital, discharge time will be determined by your treatment team.   Patients discharged the day of surgery will not be allowed to drive home, and someone needs to stay with them  for 24 hours.    Special instructions:   - Preparing For Surgery  Before surgery, you can play an important role. Because skin is not sterile, your skin needs to be as free of germs as possible. You can reduce the number of germs on your skin by washing with CHG (chlorahexidine gluconate) Soap before surgery.  CHG is an antiseptic cleaner which kills germs and bonds with the skin to continue killing germs even after washing.    Oral Hygiene is also important to reduce your risk of infection.  Remember - BRUSH YOUR TEETH THE MORNING OF SURGERY WITH YOUR REGULAR TOOTHPASTE  Please do not use if you have an allergy to CHG or antibacterial soaps. If your skin becomes reddened/irritated stop using the CHG.  Do not shave (including legs and underarms) for at least 48 hours prior to first CHG shower. It is OK to shave your face.  Please follow these instructions carefully.   Shower the NIGHT BEFORE SURGERY and the MORNING OF SURGERY with DIAL Soap.   Pat yourself dry with a CLEAN TOWEL.  Wear CLEAN PAJAMAS to bed the night before surgery  Place CLEAN SHEETS on your bed the night of your first shower and DO NOT SLEEP WITH PETS.   Day of Surgery: Please shower morning of surgery  Wear Clean/Comfortable clothing the morning of surgery Do not apply any deodorants/lotions.   Remember to brush your teeth WITH YOUR REGULAR TOOTHPASTE.   Questions were answered. Patient verbalized understanding of instructions.

## 2024-01-11 ENCOUNTER — Other Ambulatory Visit: Payer: Self-pay

## 2024-01-11 ENCOUNTER — Encounter (HOSPITAL_COMMUNITY): Payer: Self-pay | Admitting: Vascular Surgery

## 2024-01-11 ENCOUNTER — Ambulatory Visit (HOSPITAL_COMMUNITY): Payer: Medicare Other | Admitting: Physician Assistant

## 2024-01-11 ENCOUNTER — Ambulatory Visit: Payer: Medicare Other

## 2024-01-11 ENCOUNTER — Encounter (HOSPITAL_COMMUNITY)
Admission: RE | Disposition: A | Discharge status: Home / Self Care | Payer: Self-pay | Source: Home / Self Care | Attending: Vascular Surgery

## 2024-01-11 ENCOUNTER — Ambulatory Visit (HOSPITAL_COMMUNITY)
Admission: RE | Admit: 2024-01-11 | Discharge: 2024-01-11 | Disposition: A | Discharge status: Home / Self Care | Payer: Medicare Other | Attending: Vascular Surgery | Admitting: Vascular Surgery

## 2024-01-11 DIAGNOSIS — T82848A Pain from vascular prosthetic devices, implants and grafts, initial encounter: Secondary | ICD-10-CM | POA: Insufficient documentation

## 2024-01-11 DIAGNOSIS — T82868A Thrombosis of vascular prosthetic devices, implants and grafts, initial encounter: Secondary | ICD-10-CM | POA: Diagnosis not present

## 2024-01-11 DIAGNOSIS — T82898D Other specified complication of vascular prosthetic devices, implants and grafts, subsequent encounter: Secondary | ICD-10-CM

## 2024-01-11 DIAGNOSIS — I120 Hypertensive chronic kidney disease with stage 5 chronic kidney disease or end stage renal disease: Secondary | ICD-10-CM | POA: Diagnosis not present

## 2024-01-11 DIAGNOSIS — T82898A Other specified complication of vascular prosthetic devices, implants and grafts, initial encounter: Secondary | ICD-10-CM

## 2024-01-11 DIAGNOSIS — Z992 Dependence on renal dialysis: Secondary | ICD-10-CM | POA: Diagnosis not present

## 2024-01-11 DIAGNOSIS — N186 End stage renal disease: Secondary | ICD-10-CM

## 2024-01-11 DIAGNOSIS — X58XXXA Exposure to other specified factors, initial encounter: Secondary | ICD-10-CM | POA: Insufficient documentation

## 2024-01-11 HISTORY — PX: REVISON OF ARTERIOVENOUS FISTULA: SHX6074

## 2024-01-11 LAB — POCT I-STAT, CHEM 8
BUN: 45 mg/dL — ABNORMAL HIGH (ref 6–20)
Calcium, Ion: 1.06 mmol/L — ABNORMAL LOW (ref 1.15–1.40)
Chloride: 105 mmol/L (ref 98–111)
Creatinine, Ser: 14.8 mg/dL — ABNORMAL HIGH (ref 0.61–1.24)
Glucose, Bld: 91 mg/dL (ref 70–99)
HCT: 45 % (ref 39.0–52.0)
Hemoglobin: 15.3 g/dL (ref 13.0–17.0)
Potassium: 4.7 mmol/L (ref 3.5–5.1)
Sodium: 137 mmol/L (ref 135–145)
TCO2: 22 mmol/L (ref 22–32)

## 2024-01-11 SURGERY — REVISON OF ARTERIOVENOUS FISTULA
Anesthesia: Monitor Anesthesia Care | Laterality: Left

## 2024-01-11 MED ORDER — ONDANSETRON HCL 4 MG/2ML IJ SOLN
INTRAMUSCULAR | Status: DC | PRN
  Administered 2024-01-11: 4 mg via INTRAVENOUS

## 2024-01-11 MED ORDER — SODIUM CHLORIDE 0.9 % IV SOLN: INTRAVENOUS | Status: DC

## 2024-01-11 MED ORDER — FENTANYL CITRATE (PF) 250 MCG/5ML IJ SOLN
INTRAMUSCULAR | Status: DC | PRN
  Administered 2024-01-11: 50 ug via INTRAVENOUS

## 2024-01-11 MED ORDER — HEMOSTATIC AGENTS (NO CHARGE) OPTIME
TOPICAL | Status: DC | PRN
  Administered 2024-01-11: 1 via TOPICAL

## 2024-01-11 MED ORDER — HEPARIN 6000 UNIT IRRIGATION SOLUTION
Status: DC | PRN
  Administered 2024-01-11: 1

## 2024-01-11 MED ORDER — PROPOFOL 500 MG/50ML IV EMUL
INTRAVENOUS | Status: DC | PRN
  Administered 2024-01-11: 50 ug/kg/min via INTRAVENOUS

## 2024-01-11 MED ORDER — ALBUMIN HUMAN 5 % IV SOLN
12.5000 g | Freq: Once | INTRAVENOUS | Status: AC
  Administered 2024-01-11: 12.5 g via INTRAVENOUS

## 2024-01-11 MED ORDER — PROPOFOL 1000 MG/100ML IV EMUL
INTRAVENOUS | Status: AC
  Filled 2024-01-11: qty 100

## 2024-01-11 MED ORDER — OXYCODONE-ACETAMINOPHEN 5-325 MG PO TABS: 1.0000 | ORAL_TABLET | Freq: Four times a day (QID) | ORAL | 0 refills | Status: AC | PRN

## 2024-01-11 MED ORDER — FENTANYL CITRATE (PF) 250 MCG/5ML IJ SOLN
INTRAMUSCULAR | Status: AC
  Filled 2024-01-11: qty 5

## 2024-01-11 MED ORDER — 0.9 % SODIUM CHLORIDE (POUR BTL) OPTIME
TOPICAL | Status: DC | PRN
  Administered 2024-01-11: 1000 mL

## 2024-01-11 MED ORDER — SODIUM CHLORIDE 0.9 % IV SOLN
20.0000 ug | Freq: Once | INTRAVENOUS | Status: DC
  Filled 2024-01-11: qty 5

## 2024-01-11 MED ORDER — MIDAZOLAM HCL 2 MG/2ML IJ SOLN
INTRAMUSCULAR | Status: AC
  Administered 2024-01-11: 2 mg via INTRAVENOUS
  Filled 2024-01-11: qty 2

## 2024-01-11 MED ORDER — MEPIVACAINE HCL (PF) 2 % IJ SOLN
INTRAMUSCULAR | Status: DC | PRN
  Administered 2024-01-11: 5 mL

## 2024-01-11 MED ORDER — FENTANYL CITRATE (PF) 100 MCG/2ML IJ SOLN
INTRAMUSCULAR | Status: AC
  Administered 2024-01-11: 50 ug via INTRAVENOUS
  Filled 2024-01-11: qty 2

## 2024-01-11 MED ORDER — ORAL CARE MOUTH RINSE: 15.0000 mL | Freq: Once | OROMUCOSAL | Status: AC

## 2024-01-11 MED ORDER — FENTANYL CITRATE (PF) 100 MCG/2ML IJ SOLN: 50.0000 ug | Freq: Once | INTRAMUSCULAR | Status: AC

## 2024-01-11 MED ORDER — HEPARIN 6000 UNIT IRRIGATION SOLUTION
Status: AC
  Filled 2024-01-11: qty 500

## 2024-01-11 MED ORDER — ONDANSETRON HCL 4 MG/2ML IJ SOLN
INTRAMUSCULAR | Status: AC
  Filled 2024-01-11: qty 2

## 2024-01-11 MED ORDER — ALBUMIN HUMAN 5 % IV SOLN
INTRAVENOUS | Status: AC
  Filled 2024-01-11: qty 250

## 2024-01-11 MED ORDER — MIDAZOLAM HCL 2 MG/2ML IJ SOLN: 2.0000 mg | Freq: Once | INTRAMUSCULAR | Status: AC

## 2024-01-11 MED ORDER — EPHEDRINE 5 MG/ML INJ
INTRAVENOUS | Status: AC
  Filled 2024-01-11: qty 5

## 2024-01-11 MED ORDER — ALBUMIN HUMAN 5 % IV SOLN: 12.5000 g | Freq: Once | INTRAVENOUS | Status: DC

## 2024-01-11 MED ORDER — EPHEDRINE SULFATE-NACL 50-0.9 MG/10ML-% IV SOSY
PREFILLED_SYRINGE | INTRAVENOUS | Status: DC | PRN
  Administered 2024-01-11: 5 mg via INTRAVENOUS

## 2024-01-11 MED ORDER — ACETAMINOPHEN 500 MG PO TABS
1000.0000 mg | ORAL_TABLET | Freq: Once | ORAL | Status: AC
  Administered 2024-01-11: 1000 mg via ORAL
  Filled 2024-01-11: qty 2

## 2024-01-11 MED ORDER — FENTANYL CITRATE (PF) 100 MCG/2ML IJ SOLN: 25.0000 ug | INTRAMUSCULAR | Status: DC | PRN

## 2024-01-11 MED ORDER — ROPIVACAINE HCL 5 MG/ML IJ SOLN
INTRAMUSCULAR | Status: DC | PRN
  Administered 2024-01-11: 25 mL via PERINEURAL

## 2024-01-11 MED ORDER — CEFAZOLIN SODIUM-DEXTROSE 2-4 GM/100ML-% IV SOLN
2.0000 g | INTRAVENOUS | Status: AC
  Administered 2024-01-11: 2 g via INTRAVENOUS
  Filled 2024-01-11: qty 100

## 2024-01-11 MED ORDER — CHLORHEXIDINE GLUCONATE 0.12 % MT SOLN
15.0000 mL | Freq: Once | OROMUCOSAL | Status: AC
  Administered 2024-01-11: 15 mL via OROMUCOSAL
  Filled 2024-01-11: qty 15

## 2024-01-11 MED ORDER — SODIUM CHLORIDE 0.9 % IV SOLN
INTRAVENOUS | Status: DC | PRN
  Administered 2024-01-11: 20 ug via INTRAVENOUS

## 2024-01-11 MED ORDER — DROPERIDOL 2.5 MG/ML IJ SOLN
0.6250 mg | Freq: Once | INTRAMUSCULAR | Status: DC | PRN
Start: 2024-01-11 — End: 2024-01-11

## 2024-01-11 MED ORDER — CHLORHEXIDINE GLUCONATE 4 % EX SOLN
60.0000 mL | Freq: Once | CUTANEOUS | Status: DC
Start: 2024-01-12 — End: 2024-01-11

## 2024-01-11 MED ORDER — CHLORHEXIDINE GLUCONATE 4 % EX SOLN: 60.0000 mL | Freq: Once | CUTANEOUS | Status: DC

## 2024-01-11 SURGICAL SUPPLY — 45 items
APPLIER CLIP 9.375 MED OPEN (MISCELLANEOUS) IMPLANT
APPLIER CLIP 9.375 SM OPEN (CLIP) IMPLANT
ARMBAND PINK RESTRICT EXTREMIT (MISCELLANEOUS) ×1 IMPLANT
BAG COUNTER SPONGE SURGICOUNT (BAG) ×1 IMPLANT
BNDG ELASTIC 4INX 5YD STR LF (GAUZE/BANDAGES/DRESSINGS) IMPLANT
BNDG GAUZE DERMACEA FLUFF 4 (GAUZE/BANDAGES/DRESSINGS) IMPLANT
CANISTER SUCT 3000ML PPV (MISCELLANEOUS) ×1 IMPLANT
CLIP APPLIE 9.375 MED OPEN (MISCELLANEOUS) ×1 IMPLANT
CLIP APPLIE 9.375 SM OPEN (CLIP) ×1 IMPLANT
CLIP TI MEDIUM 24 (CLIP) ×1 IMPLANT
CLIP TI MEDIUM 6 (CLIP) ×3 IMPLANT
CLIP TI WIDE RED SMALL 24 (CLIP) ×1 IMPLANT
CLIP TI WIDE RED SMALL 6 (CLIP) ×2 IMPLANT
COVER PROBE W GEL 5X96 (DRAPES) IMPLANT
DERMABOND ADVANCED .7 DNX12 (GAUZE/BANDAGES/DRESSINGS) ×1 IMPLANT
ELECT REM PT RETURN 9FT ADLT (ELECTROSURGICAL) ×1 IMPLANT
ELECTRODE REM PT RTRN 9FT ADLT (ELECTROSURGICAL) ×1 IMPLANT
GAUZE PAD ABD 8X10 STRL (GAUZE/BANDAGES/DRESSINGS) IMPLANT
GAUZE SPONGE 4X4 12PLY STRL (GAUZE/BANDAGES/DRESSINGS) IMPLANT
GAUZE XEROFORM 5X9 LF (GAUZE/BANDAGES/DRESSINGS) IMPLANT
GLOVE BIOGEL PI IND STRL 8 (GLOVE) ×1 IMPLANT
GOWN STRL REUS W/ TWL LRG LVL3 (GOWN DISPOSABLE) ×2 IMPLANT
GOWN STRL REUS W/TWL 2XL LVL3 (GOWN DISPOSABLE) ×2 IMPLANT
HEMOSTAT SNOW SURGICEL 2X4 (HEMOSTASIS) IMPLANT
KIT BASIN OR (CUSTOM PROCEDURE TRAY) ×1 IMPLANT
KIT TURNOVER KIT B (KITS) ×1 IMPLANT
LOOP VESSEL MINI RED (MISCELLANEOUS) IMPLANT
NS IRRIG 1000ML POUR BTL (IV SOLUTION) ×1 IMPLANT
PACK CV ACCESS (CUSTOM PROCEDURE TRAY) ×1 IMPLANT
PAD ARMBOARD 7.5X6 YLW CONV (MISCELLANEOUS) ×2 IMPLANT
SLING ARM FOAM STRAP LRG (SOFTGOODS) IMPLANT
SLING ARM FOAM STRAP MED (SOFTGOODS) IMPLANT
SPIKE FLUID TRANSFER (MISCELLANEOUS) ×1 IMPLANT
STAPLER SKIN PROX WIDE 3.9 (STAPLE) IMPLANT
STAPLER VISISTAT 35W (STAPLE) IMPLANT
SUT ETHILON 2 0 PSLX (SUTURE) IMPLANT
SUT MNCRL AB 4-0 PS2 18 (SUTURE) ×1 IMPLANT
SUT PROLENE 5 0 C 1 24 (SUTURE) IMPLANT
SUT PROLENE 6 0 BV (SUTURE) IMPLANT
SUT PROLENE 7 0 BV 1 (SUTURE) IMPLANT
SUT SILK 3 0 SH CR/8 (SUTURE) ×1 IMPLANT
SUT VIC AB 3-0 SH 27X BRD (SUTURE) ×1 IMPLANT
TOWEL GREEN STERILE (TOWEL DISPOSABLE) ×1 IMPLANT
UNDERPAD 30X36 HEAVY ABSORB (UNDERPADS AND DIAPERS) ×1 IMPLANT
WATER STERILE IRR 1000ML POUR (IV SOLUTION) ×1 IMPLANT

## 2024-01-11 NOTE — H&P (Signed)
Office Note   Patient seen and examined in preop holding.  No complaints. No changes to medication history or physical exam since last seen in clinic. After discussing the risks and benefits of left arm fistula excision, Evert Kohl elected to proceed.   Victorino Sparrow MD  History of Present Illness   Tim Baker is a 48 y.o. (09-17-1976) male who presents for follow up for dialysis access. He recently underwent left basilic vein transposition on 09/07/2023 by Dr.Dickson. He was cleared to use his fistula by 12/07/2023.   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 Prior to his basilic fistula, he used his forearm fistula for years. This occluded mid-last year. Over time he developed two large aneurysmal areas. He would like to get these aneurysms excised. He states these aneurysms have slowly gotten larger over time and more painful. Previously Dr.Dickson was open to excision but wanted him to heal from his previous surgery before undergoing this one.  Current Facility-Administered Medications  Medication Dose Route Frequency Provider Last Rate Last Admin   0.9 %  sodium chloride infusion   Intravenous Continuous Victorino Sparrow, MD 10 mL/hr at 01/11/24 0745 New Bag at 01/11/24 0745   ceFAZolin (ANCEF) IVPB 2g/100 mL premix  2 g Intravenous 30 min Pre-Op Victorino Sparrow, MD       chlorhexidine (HIBICLENS) 4 % liquid 4 Application  60 mL Topical Once Victorino Sparrow, MD       And   [START ON 01/12/2024] chlorhexidine (HIBICLENS) 4 % liquid 4 Application  60 mL Topical Once Victorino Sparrow, MD       fentaNYL (SUBLIMAZE) 100 MCG/2ML injection            midazolam (VERSED) 2 MG/2ML injection             REVIEW OF SYSTEMS (negative unless checked):   Cardiac:  []  Chest pain or chest pressure? []  Shortness of breath upon activity? []  Shortness of breath when lying flat? []  Irregular heart rhythm?  Vascular:  []  Pain in calf, thigh, or hip  brought on by walking? []  Pain in feet at night that wakes you up from your sleep? []  Blood clot in your veins? []  Leg swelling?  Pulmonary:  []  Oxygen at home? []  Productive cough? []  Wheezing?  Neurologic:  []  Sudden weakness in arms or legs? []  Sudden numbness in arms or legs? []  Sudden onset of difficult speaking or slurred speech? []  Temporary loss of vision in one eye? []  Problems with dizziness?  Gastrointestinal:  []  Blood in stool? []  Vomited blood?  Genitourinary:  []  Burning when urinating? []  Blood in urine?  Psychiatric:  []  Major depression  Hematologic:  []  Bleeding problems? []  Problems with blood clotting?  Dermatologic:  []  Rashes or ulcers?  Constitutional:  []  Fever or chills?  Ear/Nose/Throat:  []  Change in hearing? []  Nose bleeds? []  Sore throat?  Musculoskeletal:  []  Back pain? []  Joint pain? []  Muscle pain?   Physical Examination   Vitals:   01/10/24 0949 01/11/24 0703  BP:  109/69  Pulse:  69  Resp:  17  Temp:  98.2 F (36.8 C)  TempSrc:  Oral  SpO2:  98%  Weight: 107.1 kg   Height: 5\' 10"  (1.778 m)    Body mass index is 33.88 kg/m.  General:  WDWN in NAD; vital signs documented above Gait: Not observed HENT: WNL, normocephalic Pulmonary: normal non-labored breathing , without  rales, rhonchi,  wheezing Cardiac: regular Abdomen: soft, NT, no masses Skin: without rashes Vascular Exam/Pulses: palpable left radial pulse Extremities: occluded left radiocephalic fistula with 2 large aneurysmal areas with associated skin thinning. Patent left upper arm fistula with good thrill Musculoskeletal: no muscle wasting or atrophy  Neurologic: A&O X 3;  No focal weakness or paresthesias are detected Psychiatric:  The pt has Normal affect.     Medical Decision Making   MAZEN MARCIN is a 48 y.o. male who presents for evaluation of aneurysmal left forearm fistula  The patient has ESRD and currently dialyzes via TDC. He  does have a patent left basilic vein fistula, which was recently cleared for use. He has not used this fistula yet. He has a left forearm fistula that was previously used for years. This occluded sometime mid-last year. Over time the patient developed two large aneurysms to this fistula without previous skin compromise.  He states the aneurysms continue to slowly enlarge even after fistula occlusion. These have started to cause him pain and discomfort.  On exam he has two large aneurysms to a left radiocephalic fistula. This is occluded. There is evidence of skin thinning overlying the fistula. His skin also cannot be mobilized. He would benefit from excision. I discussed this case with Dr.Kioni Stahl given the size of these aneurysms. We have discussed with the patient that we may not be able to mobilize enough of his skin post-excision for primary closure. The patient is aware he may require a wound vac to help with skin closure. He would still like to proceed.  The patient has dialysis on MWF. We can schedule him for aneurysm excision with possible wound vac placement on a Tuesday in the next couple of weeks.   Loel Dubonnet PA-C [facility] Office: [phone]  Clinic MD: Karin Lieu

## 2024-01-11 NOTE — Op Note (Signed)
    NAME: Tim Baker    MRN: [id number] DOB: [birth year]    DATE OF OPERATION: 01/11/2024  PREOP DIAGNOSIS:    Painful left AV fistula aneurysms  POSTOP DIAGNOSIS:    Same  PROCEDURE:    Left arm radiocephalic fistula excision, aneurysmectomy Skin and soft tissue rearrangement 20cm x6 cmx1cm = 120 centimeters squared  SURGEON: Victorino Sparrow  ASSIST: Nathanial Rancher, PA  ANESTHESIA: Moderate, block  EBL: 50 mL  INDICATIONS:    [name] is a 48 y.o. male with prior history of left radiocephalic fistula, which subsequently thrombosed last year.  He now has a working left brachiobasilic fistula.  The left radiocephalic fistula has very large aneurysms, which are painful.  After discussing the risks and benefits of fistula excision, aneurysmectomy, Bienvenido elected to proceed.  FINDINGS:   Thrombosed radiocephalic fistula  TECHNIQUE:   Patient is brought the OR laid in supine position.  General esthesia was induced the patient was prepped and draped in standard fashion.  The case began with an elliptical incision incorporating both of the large fistula aneurysms.  A combination of sharp dissection and cautery were used to free the fistula from the underlying soft tissue.  The patient was thrombosed, however I elected to use 2-0 silk suture to ligate both proximally and distally.  Once the fistula was excised from the left forearm, there was a soft tissue defect measuring 20 cm x 6 cm x 1 cm.  I had to undermine the skin and soft tissue both medially and laterally for roughly 7 cm in depth to promote tension-free soft tissue closure.  Once the wound bed was irrigated with copious amounts of saline, it was closed using 2-0 nylon suture in vertical mattress fashion, followed by staples at the level of the skin.  Patient had a palpable radial pulse, and excellent thrill in the left brachiobasilic fistula at case completion.  Victorino Sparrow, MD [facility] DATE OF DICTATION:   01/11/2024

## 2024-01-11 NOTE — Anesthesia Procedure Notes (Signed)
Anesthesia Regional Block: Supraclavicular block   Pre-Anesthetic Checklist: , timeout performed,  Correct Patient, Correct Site, Correct Laterality,  Correct Procedure, Correct Position, site marked,  Risks and benefits discussed,  Surgical consent,  Pre-op evaluation,  At surgeon's request and post-op pain management  Laterality: Left  Prep: chloraprep       Needles:  Injection technique: Single-shot  Needle Type: Echogenic Needle     Needle Length: 9cm  Needle Gauge: 21     Additional Needles:   Procedures:,,,, ultrasound used (permanent image in chart),,    Narrative:  Start time: 01/11/2024 9:18 AM End time: 01/11/2024 9:25 AM Injection made incrementally with aspirations every 5 mL.  Performed by: Personally  Anesthesiologist: Marcene Duos, MD

## 2024-01-11 NOTE — Discharge Instructions (Signed)
[facility]  Discharge Instructions  AV Fistula or Graft Surgery for Dialysis Access  Please refer to the following instructions for your post-procedure care. Your surgeon or physician assistant will discuss any changes with you.  Activity  You may drive the day following your surgery, if you are comfortable and no longer taking prescription pain medication. Resume full activity as the soreness in your incision resolves.  Bathing/Showering  You may shower after you go home. Keep your incision dry for 48 hours. Do not soak in a bathtub, hot tub, or swim until the incision heals completely. You may not shower if you have a hemodialysis catheter.  Incision Care  Clean your incision with mild soap and water after 48 hours. Pat the area dry with a clean towel. You do not need a bandage unless otherwise instructed. Do not apply any ointments or creams to your incision. You may have skin glue on your incision. Do not peel it off. It will come off on its own in about one week. Your arm may swell a bit after surgery. To reduce swelling use pillows to elevate your arm so it is above your heart. Your doctor will tell you if you need to lightly wrap your arm with an ACE bandage.  Diet  Resume your normal diet. There are not special food restrictions following this procedure. In order to heal from your surgery, it is CRITICAL to get adequate nutrition. Your body requires vitamins, minerals, and protein. Vegetables are the best source of vitamins and minerals. Vegetables also provide the perfect balance of protein. Processed food has little nutritional value, so try to avoid this.  Medications  Resume taking all of your medications. If your incision is causing pain, you may take over-the counter pain relievers such as acetaminophen (Tylenol). If you were prescribed a stronger pain medication, please be aware these medications can cause nausea and constipation. Prevent  nausea by taking the medication with a snack or meal. Avoid constipation by drinking plenty of fluids and eating foods with high amount of fiber, such as fruits, vegetables, and grains.  Do not take Tylenol if you are taking prescription pain medications.  Follow up Your surgeon may want to see you in the office following your access surgery. If so, this will be arranged at the time of your surgery.  Please call us immediately for any of the following conditions:  Increased pain, redness, drainage (pus) from your incision site Fever of 101 degrees or higher Severe or worsening pain at your incision site Hand pain or numbness.  Reduce your risk of vascular disease:  Stop smoking. If you would like help, call [facility] at 1-800-QUIT-NOW ((641)739-1770) or [place] at 306-505-5991  Manage your cholesterol Maintain a desired weight Control your diabetes Keep your blood pressure down  Dialysis  It will take several weeks to several months for your new dialysis access to be ready for use. Your surgeon will determine when it is okay to use it. Your nephrologist will continue to direct your dialysis. You can continue to use your Permcath until your new access is ready for use.   01/11/2024 Tim Baker [id number] Sep 19, 1976  Surgeon(s): Victorino Sparrow, MD  Procedure(s): EXCISION OF ARTERIOVENOUS FISTULA, LEFT   May stick graft immediately  X May stick AV fistula in designated area only: left upper arm  X Do not stick left forearm    If you have any questions, please call the office at [phone].

## 2024-01-11 NOTE — Anesthesia Postprocedure Evaluation (Signed)
Anesthesia Post Note  Patient: Tim Baker  Procedure(s) Performed: EXCISION OF ARTERIOVENOUS FISTULA, LEFT (Left)     Patient location during evaluation: PACU Anesthesia Type: Regional Level of consciousness: awake and alert Pain management: pain level controlled Vital Signs Assessment: post-procedure vital signs reviewed and stable Respiratory status: spontaneous breathing, nonlabored ventilation, respiratory function stable and patient connected to nasal cannula oxygen Cardiovascular status: stable and blood pressure returned to baseline Postop Assessment: no apparent nausea or vomiting Anesthetic complications: no  No notable events documented.  Last Vitals:  Vitals:   01/11/24 1215 01/11/24 1230  BP: (!) 94/56 94/64  Pulse: (!) 51 (!) 54  Resp: 12 13  Temp:    SpO2: 95% 94%    Last Pain:  Vitals:   01/11/24 1230  TempSrc:   PainSc: 0-No pain   Pain Goal:                   Tim Baker

## 2024-01-11 NOTE — Transfer of Care (Signed)
Immediate Anesthesia Transfer of Care Note  Patient: Tim Baker  Procedure(s) Performed: EXCISION OF ARTERIOVENOUS FISTULA, LEFT (Left)  Patient Location: PACU  Anesthesia Type:MAC combined with regional for post-op pain  Level of Consciousness: awake, drowsy, and patient cooperative  Airway & Oxygen Therapy: Patient Spontanous Breathing and Patient connected to face mask oxygen  Post-op Assessment: Report given to RN and Post -op Vital signs reviewed and stable  Post vital signs: Reviewed and stable  Last Vitals:  Vitals Value Taken Time  BP 108/54 01/11/24 1110  Temp    Pulse 61 01/11/24 1113  Resp 10 01/11/24 1113  SpO2 99 % 01/11/24 1113  Vitals shown include unfiled device data.  Last Pain:  Vitals:   01/11/24 0737  TempSrc:   PainSc: 0-No pain         Complications: No notable events documented.

## 2024-01-11 NOTE — Anesthesia Preprocedure Evaluation (Signed)
Anesthesia Evaluation  Patient identified by MRN, date of birth, ID band Patient awake    Reviewed: Allergy & Precautions, NPO status , Patient's Chart, lab work & pertinent test results  Airway Mallampati: II  TM Distance: >3 FB Neck ROM: Full    Dental   Pulmonary neg pulmonary ROS   breath sounds clear to auscultation       Cardiovascular hypertension, Pt. on medications  Rhythm:Regular Rate:Normal     Neuro/Psych negative neurological ROS     GI/Hepatic Neg liver ROS,GERD  ,,  Endo/Other  negative endocrine ROS    Renal/GU ESRF and DialysisRenal disease     Musculoskeletal   Abdominal   Peds  Hematology  (+) Blood dyscrasia, anemia   Anesthesia Other Findings   Reproductive/Obstetrics                             Anesthesia Physical Anesthesia Plan  ASA: 4  Anesthesia Plan: MAC and Regional   Post-op Pain Management: Regional block* and Tylenol PO (pre-op)*   Induction: Intravenous  PONV Risk Score and Plan: 1 and Propofol infusion and Ondansetron  Airway Management Planned: Natural Airway and Simple Face Mask  Additional Equipment: None  Intra-op Plan:   Post-operative Plan:   Informed Consent: I have reviewed the patients History and Physical, chart, labs and discussed the procedure including the risks, benefits and alternatives for the proposed anesthesia with the patient or authorized representative who has indicated his/her understanding and acceptance.       Plan Discussed with: CRNA  Anesthesia Plan Comments:        Anesthesia Quick Evaluation

## 2024-01-12 ENCOUNTER — Encounter (HOSPITAL_COMMUNITY): Payer: Self-pay | Admitting: Vascular Surgery

## 2024-01-12 DIAGNOSIS — T8249XD Other complication of vascular dialysis catheter, subsequent encounter: Secondary | ICD-10-CM | POA: Diagnosis not present

## 2024-01-12 DIAGNOSIS — Z992 Dependence on renal dialysis: Secondary | ICD-10-CM | POA: Diagnosis not present

## 2024-01-12 DIAGNOSIS — Z5181 Encounter for therapeutic drug level monitoring: Secondary | ICD-10-CM | POA: Diagnosis not present

## 2024-01-12 DIAGNOSIS — N2581 Secondary hyperparathyroidism of renal origin: Secondary | ICD-10-CM | POA: Diagnosis not present

## 2024-01-12 DIAGNOSIS — D631 Anemia in chronic kidney disease: Secondary | ICD-10-CM | POA: Diagnosis not present

## 2024-01-12 DIAGNOSIS — D689 Coagulation defect, unspecified: Secondary | ICD-10-CM | POA: Diagnosis not present

## 2024-01-12 DIAGNOSIS — N186 End stage renal disease: Secondary | ICD-10-CM | POA: Diagnosis not present

## 2024-01-14 DIAGNOSIS — T8249XD Other complication of vascular dialysis catheter, subsequent encounter: Secondary | ICD-10-CM | POA: Diagnosis not present

## 2024-01-14 DIAGNOSIS — Z992 Dependence on renal dialysis: Secondary | ICD-10-CM | POA: Diagnosis not present

## 2024-01-14 DIAGNOSIS — Z5181 Encounter for therapeutic drug level monitoring: Secondary | ICD-10-CM | POA: Diagnosis not present

## 2024-01-14 DIAGNOSIS — T8610 Unspecified complication of kidney transplant: Secondary | ICD-10-CM | POA: Diagnosis not present

## 2024-01-14 DIAGNOSIS — N2581 Secondary hyperparathyroidism of renal origin: Secondary | ICD-10-CM | POA: Diagnosis not present

## 2024-01-14 DIAGNOSIS — N186 End stage renal disease: Secondary | ICD-10-CM | POA: Diagnosis not present

## 2024-01-14 DIAGNOSIS — D631 Anemia in chronic kidney disease: Secondary | ICD-10-CM | POA: Diagnosis not present

## 2024-01-14 DIAGNOSIS — D689 Coagulation defect, unspecified: Secondary | ICD-10-CM | POA: Diagnosis not present

## 2024-01-17 DIAGNOSIS — D631 Anemia in chronic kidney disease: Secondary | ICD-10-CM | POA: Diagnosis not present

## 2024-01-17 DIAGNOSIS — T8249XD Other complication of vascular dialysis catheter, subsequent encounter: Secondary | ICD-10-CM | POA: Diagnosis not present

## 2024-01-17 DIAGNOSIS — N186 End stage renal disease: Secondary | ICD-10-CM | POA: Diagnosis not present

## 2024-01-17 DIAGNOSIS — D689 Coagulation defect, unspecified: Secondary | ICD-10-CM | POA: Diagnosis not present

## 2024-01-17 DIAGNOSIS — N2581 Secondary hyperparathyroidism of renal origin: Secondary | ICD-10-CM | POA: Diagnosis not present

## 2024-01-17 DIAGNOSIS — Z992 Dependence on renal dialysis: Secondary | ICD-10-CM | POA: Diagnosis not present

## 2024-01-18 DIAGNOSIS — D689 Coagulation defect, unspecified: Secondary | ICD-10-CM | POA: Diagnosis not present

## 2024-01-18 DIAGNOSIS — D631 Anemia in chronic kidney disease: Secondary | ICD-10-CM | POA: Diagnosis not present

## 2024-01-18 DIAGNOSIS — T8249XD Other complication of vascular dialysis catheter, subsequent encounter: Secondary | ICD-10-CM | POA: Diagnosis not present

## 2024-01-18 DIAGNOSIS — Z992 Dependence on renal dialysis: Secondary | ICD-10-CM | POA: Diagnosis not present

## 2024-01-18 DIAGNOSIS — N2581 Secondary hyperparathyroidism of renal origin: Secondary | ICD-10-CM | POA: Diagnosis not present

## 2024-01-18 DIAGNOSIS — N186 End stage renal disease: Secondary | ICD-10-CM | POA: Diagnosis not present

## 2024-01-19 DIAGNOSIS — N2581 Secondary hyperparathyroidism of renal origin: Secondary | ICD-10-CM | POA: Diagnosis not present

## 2024-01-19 DIAGNOSIS — D689 Coagulation defect, unspecified: Secondary | ICD-10-CM | POA: Diagnosis not present

## 2024-01-19 DIAGNOSIS — T8249XD Other complication of vascular dialysis catheter, subsequent encounter: Secondary | ICD-10-CM | POA: Diagnosis not present

## 2024-01-19 DIAGNOSIS — D631 Anemia in chronic kidney disease: Secondary | ICD-10-CM | POA: Diagnosis not present

## 2024-01-19 DIAGNOSIS — N186 End stage renal disease: Secondary | ICD-10-CM | POA: Diagnosis not present

## 2024-01-19 DIAGNOSIS — Z992 Dependence on renal dialysis: Secondary | ICD-10-CM | POA: Diagnosis not present

## 2024-01-21 DIAGNOSIS — D689 Coagulation defect, unspecified: Secondary | ICD-10-CM | POA: Diagnosis not present

## 2024-01-21 DIAGNOSIS — T8249XD Other complication of vascular dialysis catheter, subsequent encounter: Secondary | ICD-10-CM | POA: Diagnosis not present

## 2024-01-21 DIAGNOSIS — D631 Anemia in chronic kidney disease: Secondary | ICD-10-CM | POA: Diagnosis not present

## 2024-01-21 DIAGNOSIS — N186 End stage renal disease: Secondary | ICD-10-CM | POA: Diagnosis not present

## 2024-01-21 DIAGNOSIS — N2581 Secondary hyperparathyroidism of renal origin: Secondary | ICD-10-CM | POA: Diagnosis not present

## 2024-01-21 DIAGNOSIS — Z992 Dependence on renal dialysis: Secondary | ICD-10-CM | POA: Diagnosis not present

## 2024-01-24 DIAGNOSIS — D631 Anemia in chronic kidney disease: Secondary | ICD-10-CM | POA: Diagnosis not present

## 2024-01-24 DIAGNOSIS — D689 Coagulation defect, unspecified: Secondary | ICD-10-CM | POA: Diagnosis not present

## 2024-01-24 DIAGNOSIS — T8249XD Other complication of vascular dialysis catheter, subsequent encounter: Secondary | ICD-10-CM | POA: Diagnosis not present

## 2024-01-24 DIAGNOSIS — N2581 Secondary hyperparathyroidism of renal origin: Secondary | ICD-10-CM | POA: Diagnosis not present

## 2024-01-24 DIAGNOSIS — Z992 Dependence on renal dialysis: Secondary | ICD-10-CM | POA: Diagnosis not present

## 2024-01-24 DIAGNOSIS — N186 End stage renal disease: Secondary | ICD-10-CM | POA: Diagnosis not present

## 2024-01-26 DIAGNOSIS — Z992 Dependence on renal dialysis: Secondary | ICD-10-CM | POA: Diagnosis not present

## 2024-01-26 DIAGNOSIS — N186 End stage renal disease: Secondary | ICD-10-CM | POA: Diagnosis not present

## 2024-01-26 DIAGNOSIS — D689 Coagulation defect, unspecified: Secondary | ICD-10-CM | POA: Diagnosis not present

## 2024-01-26 DIAGNOSIS — D631 Anemia in chronic kidney disease: Secondary | ICD-10-CM | POA: Diagnosis not present

## 2024-01-26 DIAGNOSIS — T8249XD Other complication of vascular dialysis catheter, subsequent encounter: Secondary | ICD-10-CM | POA: Diagnosis not present

## 2024-01-26 DIAGNOSIS — N2581 Secondary hyperparathyroidism of renal origin: Secondary | ICD-10-CM | POA: Diagnosis not present

## 2024-01-28 DIAGNOSIS — D689 Coagulation defect, unspecified: Secondary | ICD-10-CM | POA: Diagnosis not present

## 2024-01-28 DIAGNOSIS — T8249XD Other complication of vascular dialysis catheter, subsequent encounter: Secondary | ICD-10-CM | POA: Diagnosis not present

## 2024-01-28 DIAGNOSIS — Z992 Dependence on renal dialysis: Secondary | ICD-10-CM | POA: Diagnosis not present

## 2024-01-28 DIAGNOSIS — D631 Anemia in chronic kidney disease: Secondary | ICD-10-CM | POA: Diagnosis not present

## 2024-01-28 DIAGNOSIS — N2581 Secondary hyperparathyroidism of renal origin: Secondary | ICD-10-CM | POA: Diagnosis not present

## 2024-01-28 DIAGNOSIS — N186 End stage renal disease: Secondary | ICD-10-CM | POA: Diagnosis not present

## 2024-01-31 DIAGNOSIS — Z992 Dependence on renal dialysis: Secondary | ICD-10-CM | POA: Diagnosis not present

## 2024-01-31 DIAGNOSIS — N186 End stage renal disease: Secondary | ICD-10-CM | POA: Diagnosis not present

## 2024-01-31 DIAGNOSIS — D631 Anemia in chronic kidney disease: Secondary | ICD-10-CM | POA: Diagnosis not present

## 2024-01-31 DIAGNOSIS — N2581 Secondary hyperparathyroidism of renal origin: Secondary | ICD-10-CM | POA: Diagnosis not present

## 2024-01-31 DIAGNOSIS — D689 Coagulation defect, unspecified: Secondary | ICD-10-CM | POA: Diagnosis not present

## 2024-01-31 DIAGNOSIS — T8249XD Other complication of vascular dialysis catheter, subsequent encounter: Secondary | ICD-10-CM | POA: Diagnosis not present

## 2024-02-02 DIAGNOSIS — N2581 Secondary hyperparathyroidism of renal origin: Secondary | ICD-10-CM | POA: Diagnosis not present

## 2024-02-02 DIAGNOSIS — D631 Anemia in chronic kidney disease: Secondary | ICD-10-CM | POA: Diagnosis not present

## 2024-02-02 DIAGNOSIS — T8249XD Other complication of vascular dialysis catheter, subsequent encounter: Secondary | ICD-10-CM | POA: Diagnosis not present

## 2024-02-02 DIAGNOSIS — D689 Coagulation defect, unspecified: Secondary | ICD-10-CM | POA: Diagnosis not present

## 2024-02-02 DIAGNOSIS — Z992 Dependence on renal dialysis: Secondary | ICD-10-CM | POA: Diagnosis not present

## 2024-02-02 DIAGNOSIS — N186 End stage renal disease: Secondary | ICD-10-CM | POA: Diagnosis not present

## 2024-02-03 ENCOUNTER — Ambulatory Visit (INDEPENDENT_AMBULATORY_CARE_PROVIDER_SITE_OTHER): Payer: Medicare Other | Admitting: Physician Assistant

## 2024-02-03 VITALS — BP 108/70 | HR 74 | Temp 97.3°F | Resp 18 | Ht 70.0 in | Wt 225.2 lb

## 2024-02-03 DIAGNOSIS — N186 End stage renal disease: Secondary | ICD-10-CM

## 2024-02-03 DIAGNOSIS — T82898D Other specified complication of vascular prosthetic devices, implants and grafts, subsequent encounter: Secondary | ICD-10-CM

## 2024-02-03 DIAGNOSIS — Z992 Dependence on renal dialysis: Secondary | ICD-10-CM

## 2024-02-03 NOTE — Progress Notes (Signed)
    Postoperative Access Visit   History of Present Illness   Tim Baker is a 48 y.o. year old male who presents for postoperative follow-up for: excision of left radiocephalic AV fistula on 01/11/24 by Dr. Karin Lieu. This was due to painful aneurysms in a thrombosed RC fistula. The patient's wounds are healing very well. The patient notes no steal symptoms.  The patient is able to complete their activities of daily living.  He has been cleaning his incision with mild soap and water.   He currently dialyzes on MWF at the [facility] Location via left upper arm BC fistula. He still does have a TDC in place  Physical Examination   Vitals:   02/03/24 1104  BP: 108/70  Pulse: 74  Resp: 18  Temp: (!) 97.3 F (36.3 C)  TempSrc: Temporal  SpO2: 98%  Weight: 225 lb 3.2 oz (102.2 kg)  Height: 5\' 10"  (1.778 m)   Body mass index is 32.31 kg/m.  left arm Incision is healing well, staples and sutures removed. 2+ radial pulse, hand grip is 5/5, sensation in digits is intact, palpable thrill, bruit can be auscultated     Medical Decision Making   Tim Baker is a 48 y.o. year old male who presents s/p excision of left radiocephalic AV fistula on 01/11/24 by Dr. Karin Lieu. This was due to painful aneurysms in a thrombosed RC fistula. The patient's wounds are healing very well. Staples and sutures removed today.  Patent is without signs or symptoms of steal syndrome He has appointment scheduled at Dr. Hadley Pen office for [facility] removal on 03/07/24 The patient may follow up on a prn basis   Tim Congress, PA-C [facility] [place] Office: [phone]  Clinic MD: Karin Lieu

## 2024-02-04 DIAGNOSIS — D631 Anemia in chronic kidney disease: Secondary | ICD-10-CM | POA: Diagnosis not present

## 2024-02-04 DIAGNOSIS — N2581 Secondary hyperparathyroidism of renal origin: Secondary | ICD-10-CM | POA: Diagnosis not present

## 2024-02-04 DIAGNOSIS — Z992 Dependence on renal dialysis: Secondary | ICD-10-CM | POA: Diagnosis not present

## 2024-02-04 DIAGNOSIS — D689 Coagulation defect, unspecified: Secondary | ICD-10-CM | POA: Diagnosis not present

## 2024-02-04 DIAGNOSIS — N186 End stage renal disease: Secondary | ICD-10-CM | POA: Diagnosis not present

## 2024-02-04 DIAGNOSIS — T8249XD Other complication of vascular dialysis catheter, subsequent encounter: Secondary | ICD-10-CM | POA: Diagnosis not present

## 2024-02-07 DIAGNOSIS — N186 End stage renal disease: Secondary | ICD-10-CM | POA: Diagnosis not present

## 2024-02-07 DIAGNOSIS — Z992 Dependence on renal dialysis: Secondary | ICD-10-CM | POA: Diagnosis not present

## 2024-02-07 DIAGNOSIS — D689 Coagulation defect, unspecified: Secondary | ICD-10-CM | POA: Diagnosis not present

## 2024-02-07 DIAGNOSIS — N2581 Secondary hyperparathyroidism of renal origin: Secondary | ICD-10-CM | POA: Diagnosis not present

## 2024-02-07 DIAGNOSIS — D631 Anemia in chronic kidney disease: Secondary | ICD-10-CM | POA: Diagnosis not present

## 2024-02-07 DIAGNOSIS — T8249XD Other complication of vascular dialysis catheter, subsequent encounter: Secondary | ICD-10-CM | POA: Diagnosis not present

## 2024-02-08 DIAGNOSIS — Z992 Dependence on renal dialysis: Secondary | ICD-10-CM | POA: Diagnosis not present

## 2024-02-08 DIAGNOSIS — N186 End stage renal disease: Secondary | ICD-10-CM | POA: Diagnosis not present

## 2024-02-09 DIAGNOSIS — Z992 Dependence on renal dialysis: Secondary | ICD-10-CM | POA: Diagnosis not present

## 2024-02-09 DIAGNOSIS — T8249XD Other complication of vascular dialysis catheter, subsequent encounter: Secondary | ICD-10-CM | POA: Diagnosis not present

## 2024-02-09 DIAGNOSIS — N2581 Secondary hyperparathyroidism of renal origin: Secondary | ICD-10-CM | POA: Diagnosis not present

## 2024-02-09 DIAGNOSIS — D631 Anemia in chronic kidney disease: Secondary | ICD-10-CM | POA: Diagnosis not present

## 2024-02-09 DIAGNOSIS — D689 Coagulation defect, unspecified: Secondary | ICD-10-CM | POA: Diagnosis not present

## 2024-02-09 DIAGNOSIS — N186 End stage renal disease: Secondary | ICD-10-CM | POA: Diagnosis not present

## 2024-02-11 DIAGNOSIS — T8610 Unspecified complication of kidney transplant: Secondary | ICD-10-CM | POA: Diagnosis not present

## 2024-02-11 DIAGNOSIS — Z992 Dependence on renal dialysis: Secondary | ICD-10-CM | POA: Diagnosis not present

## 2024-02-11 DIAGNOSIS — N2581 Secondary hyperparathyroidism of renal origin: Secondary | ICD-10-CM | POA: Diagnosis not present

## 2024-02-11 DIAGNOSIS — D689 Coagulation defect, unspecified: Secondary | ICD-10-CM | POA: Diagnosis not present

## 2024-02-11 DIAGNOSIS — N186 End stage renal disease: Secondary | ICD-10-CM | POA: Diagnosis not present

## 2024-02-11 DIAGNOSIS — T8249XD Other complication of vascular dialysis catheter, subsequent encounter: Secondary | ICD-10-CM | POA: Diagnosis not present

## 2024-02-11 DIAGNOSIS — D631 Anemia in chronic kidney disease: Secondary | ICD-10-CM | POA: Diagnosis not present

## 2024-02-14 DIAGNOSIS — D689 Coagulation defect, unspecified: Secondary | ICD-10-CM | POA: Diagnosis not present

## 2024-02-14 DIAGNOSIS — R197 Diarrhea, unspecified: Secondary | ICD-10-CM | POA: Diagnosis not present

## 2024-02-14 DIAGNOSIS — D631 Anemia in chronic kidney disease: Secondary | ICD-10-CM | POA: Diagnosis not present

## 2024-02-14 DIAGNOSIS — Z992 Dependence on renal dialysis: Secondary | ICD-10-CM | POA: Diagnosis not present

## 2024-02-14 DIAGNOSIS — Z5181 Encounter for therapeutic drug level monitoring: Secondary | ICD-10-CM | POA: Diagnosis not present

## 2024-02-14 DIAGNOSIS — N2581 Secondary hyperparathyroidism of renal origin: Secondary | ICD-10-CM | POA: Diagnosis not present

## 2024-02-14 DIAGNOSIS — N186 End stage renal disease: Secondary | ICD-10-CM | POA: Diagnosis not present

## 2024-02-16 DIAGNOSIS — R197 Diarrhea, unspecified: Secondary | ICD-10-CM | POA: Diagnosis not present

## 2024-02-16 DIAGNOSIS — N2581 Secondary hyperparathyroidism of renal origin: Secondary | ICD-10-CM | POA: Diagnosis not present

## 2024-02-16 DIAGNOSIS — D631 Anemia in chronic kidney disease: Secondary | ICD-10-CM | POA: Diagnosis not present

## 2024-02-16 DIAGNOSIS — D689 Coagulation defect, unspecified: Secondary | ICD-10-CM | POA: Diagnosis not present

## 2024-02-16 DIAGNOSIS — Z5181 Encounter for therapeutic drug level monitoring: Secondary | ICD-10-CM | POA: Diagnosis not present

## 2024-02-16 DIAGNOSIS — Z992 Dependence on renal dialysis: Secondary | ICD-10-CM | POA: Diagnosis not present

## 2024-02-16 DIAGNOSIS — N186 End stage renal disease: Secondary | ICD-10-CM | POA: Diagnosis not present

## 2024-02-18 DIAGNOSIS — D689 Coagulation defect, unspecified: Secondary | ICD-10-CM | POA: Diagnosis not present

## 2024-02-18 DIAGNOSIS — Z992 Dependence on renal dialysis: Secondary | ICD-10-CM | POA: Diagnosis not present

## 2024-02-18 DIAGNOSIS — N186 End stage renal disease: Secondary | ICD-10-CM | POA: Diagnosis not present

## 2024-02-18 DIAGNOSIS — D631 Anemia in chronic kidney disease: Secondary | ICD-10-CM | POA: Diagnosis not present

## 2024-02-18 DIAGNOSIS — R197 Diarrhea, unspecified: Secondary | ICD-10-CM | POA: Diagnosis not present

## 2024-02-18 DIAGNOSIS — N2581 Secondary hyperparathyroidism of renal origin: Secondary | ICD-10-CM | POA: Diagnosis not present

## 2024-02-18 DIAGNOSIS — Z5181 Encounter for therapeutic drug level monitoring: Secondary | ICD-10-CM | POA: Diagnosis not present

## 2024-02-21 DIAGNOSIS — Z992 Dependence on renal dialysis: Secondary | ICD-10-CM | POA: Diagnosis not present

## 2024-02-21 DIAGNOSIS — R197 Diarrhea, unspecified: Secondary | ICD-10-CM | POA: Diagnosis not present

## 2024-02-21 DIAGNOSIS — Z5181 Encounter for therapeutic drug level monitoring: Secondary | ICD-10-CM | POA: Diagnosis not present

## 2024-02-21 DIAGNOSIS — D631 Anemia in chronic kidney disease: Secondary | ICD-10-CM | POA: Diagnosis not present

## 2024-02-21 DIAGNOSIS — N2581 Secondary hyperparathyroidism of renal origin: Secondary | ICD-10-CM | POA: Diagnosis not present

## 2024-02-21 DIAGNOSIS — D689 Coagulation defect, unspecified: Secondary | ICD-10-CM | POA: Diagnosis not present

## 2024-02-21 DIAGNOSIS — N186 End stage renal disease: Secondary | ICD-10-CM | POA: Diagnosis not present

## 2024-02-23 DIAGNOSIS — D631 Anemia in chronic kidney disease: Secondary | ICD-10-CM | POA: Diagnosis not present

## 2024-02-23 DIAGNOSIS — Z5181 Encounter for therapeutic drug level monitoring: Secondary | ICD-10-CM | POA: Diagnosis not present

## 2024-02-23 DIAGNOSIS — N186 End stage renal disease: Secondary | ICD-10-CM | POA: Diagnosis not present

## 2024-02-23 DIAGNOSIS — R197 Diarrhea, unspecified: Secondary | ICD-10-CM | POA: Diagnosis not present

## 2024-02-23 DIAGNOSIS — Z992 Dependence on renal dialysis: Secondary | ICD-10-CM | POA: Diagnosis not present

## 2024-02-23 DIAGNOSIS — N2581 Secondary hyperparathyroidism of renal origin: Secondary | ICD-10-CM | POA: Diagnosis not present

## 2024-02-23 DIAGNOSIS — D689 Coagulation defect, unspecified: Secondary | ICD-10-CM | POA: Diagnosis not present

## 2024-02-25 DIAGNOSIS — N2581 Secondary hyperparathyroidism of renal origin: Secondary | ICD-10-CM | POA: Diagnosis not present

## 2024-02-25 DIAGNOSIS — D631 Anemia in chronic kidney disease: Secondary | ICD-10-CM | POA: Diagnosis not present

## 2024-02-25 DIAGNOSIS — R197 Diarrhea, unspecified: Secondary | ICD-10-CM | POA: Diagnosis not present

## 2024-02-25 DIAGNOSIS — Z992 Dependence on renal dialysis: Secondary | ICD-10-CM | POA: Diagnosis not present

## 2024-02-25 DIAGNOSIS — N186 End stage renal disease: Secondary | ICD-10-CM | POA: Diagnosis not present

## 2024-02-25 DIAGNOSIS — D689 Coagulation defect, unspecified: Secondary | ICD-10-CM | POA: Diagnosis not present

## 2024-02-25 DIAGNOSIS — Z5181 Encounter for therapeutic drug level monitoring: Secondary | ICD-10-CM | POA: Diagnosis not present

## 2024-02-28 DIAGNOSIS — D631 Anemia in chronic kidney disease: Secondary | ICD-10-CM | POA: Diagnosis not present

## 2024-02-28 DIAGNOSIS — Z5181 Encounter for therapeutic drug level monitoring: Secondary | ICD-10-CM | POA: Diagnosis not present

## 2024-02-28 DIAGNOSIS — Z992 Dependence on renal dialysis: Secondary | ICD-10-CM | POA: Diagnosis not present

## 2024-02-28 DIAGNOSIS — R197 Diarrhea, unspecified: Secondary | ICD-10-CM | POA: Diagnosis not present

## 2024-02-28 DIAGNOSIS — N2581 Secondary hyperparathyroidism of renal origin: Secondary | ICD-10-CM | POA: Diagnosis not present

## 2024-02-28 DIAGNOSIS — N186 End stage renal disease: Secondary | ICD-10-CM | POA: Diagnosis not present

## 2024-02-28 DIAGNOSIS — D689 Coagulation defect, unspecified: Secondary | ICD-10-CM | POA: Diagnosis not present

## 2024-03-01 DIAGNOSIS — Z992 Dependence on renal dialysis: Secondary | ICD-10-CM | POA: Diagnosis not present

## 2024-03-01 DIAGNOSIS — D689 Coagulation defect, unspecified: Secondary | ICD-10-CM | POA: Diagnosis not present

## 2024-03-01 DIAGNOSIS — R197 Diarrhea, unspecified: Secondary | ICD-10-CM | POA: Diagnosis not present

## 2024-03-01 DIAGNOSIS — N186 End stage renal disease: Secondary | ICD-10-CM | POA: Diagnosis not present

## 2024-03-01 DIAGNOSIS — N2581 Secondary hyperparathyroidism of renal origin: Secondary | ICD-10-CM | POA: Diagnosis not present

## 2024-03-01 DIAGNOSIS — D631 Anemia in chronic kidney disease: Secondary | ICD-10-CM | POA: Diagnosis not present

## 2024-03-01 DIAGNOSIS — Z5181 Encounter for therapeutic drug level monitoring: Secondary | ICD-10-CM | POA: Diagnosis not present

## 2024-03-03 DIAGNOSIS — N2581 Secondary hyperparathyroidism of renal origin: Secondary | ICD-10-CM | POA: Diagnosis not present

## 2024-03-03 DIAGNOSIS — D631 Anemia in chronic kidney disease: Secondary | ICD-10-CM | POA: Diagnosis not present

## 2024-03-03 DIAGNOSIS — D689 Coagulation defect, unspecified: Secondary | ICD-10-CM | POA: Diagnosis not present

## 2024-03-03 DIAGNOSIS — Z992 Dependence on renal dialysis: Secondary | ICD-10-CM | POA: Diagnosis not present

## 2024-03-03 DIAGNOSIS — R197 Diarrhea, unspecified: Secondary | ICD-10-CM | POA: Diagnosis not present

## 2024-03-03 DIAGNOSIS — N186 End stage renal disease: Secondary | ICD-10-CM | POA: Diagnosis not present

## 2024-03-03 DIAGNOSIS — Z5181 Encounter for therapeutic drug level monitoring: Secondary | ICD-10-CM | POA: Diagnosis not present

## 2024-03-06 DIAGNOSIS — Z5181 Encounter for therapeutic drug level monitoring: Secondary | ICD-10-CM | POA: Diagnosis not present

## 2024-03-06 DIAGNOSIS — D689 Coagulation defect, unspecified: Secondary | ICD-10-CM | POA: Diagnosis not present

## 2024-03-06 DIAGNOSIS — N2581 Secondary hyperparathyroidism of renal origin: Secondary | ICD-10-CM | POA: Diagnosis not present

## 2024-03-06 DIAGNOSIS — D631 Anemia in chronic kidney disease: Secondary | ICD-10-CM | POA: Diagnosis not present

## 2024-03-06 DIAGNOSIS — Z992 Dependence on renal dialysis: Secondary | ICD-10-CM | POA: Diagnosis not present

## 2024-03-06 DIAGNOSIS — R197 Diarrhea, unspecified: Secondary | ICD-10-CM | POA: Diagnosis not present

## 2024-03-06 DIAGNOSIS — N186 End stage renal disease: Secondary | ICD-10-CM | POA: Diagnosis not present

## 2024-03-08 DIAGNOSIS — R197 Diarrhea, unspecified: Secondary | ICD-10-CM | POA: Diagnosis not present

## 2024-03-08 DIAGNOSIS — N186 End stage renal disease: Secondary | ICD-10-CM | POA: Diagnosis not present

## 2024-03-08 DIAGNOSIS — D689 Coagulation defect, unspecified: Secondary | ICD-10-CM | POA: Diagnosis not present

## 2024-03-08 DIAGNOSIS — N2581 Secondary hyperparathyroidism of renal origin: Secondary | ICD-10-CM | POA: Diagnosis not present

## 2024-03-08 DIAGNOSIS — D631 Anemia in chronic kidney disease: Secondary | ICD-10-CM | POA: Diagnosis not present

## 2024-03-08 DIAGNOSIS — Z5181 Encounter for therapeutic drug level monitoring: Secondary | ICD-10-CM | POA: Diagnosis not present

## 2024-03-08 DIAGNOSIS — Z992 Dependence on renal dialysis: Secondary | ICD-10-CM | POA: Diagnosis not present

## 2024-03-10 DIAGNOSIS — D631 Anemia in chronic kidney disease: Secondary | ICD-10-CM | POA: Diagnosis not present

## 2024-03-10 DIAGNOSIS — D689 Coagulation defect, unspecified: Secondary | ICD-10-CM | POA: Diagnosis not present

## 2024-03-10 DIAGNOSIS — Z992 Dependence on renal dialysis: Secondary | ICD-10-CM | POA: Diagnosis not present

## 2024-03-10 DIAGNOSIS — Z5181 Encounter for therapeutic drug level monitoring: Secondary | ICD-10-CM | POA: Diagnosis not present

## 2024-03-10 DIAGNOSIS — N2581 Secondary hyperparathyroidism of renal origin: Secondary | ICD-10-CM | POA: Diagnosis not present

## 2024-03-10 DIAGNOSIS — R197 Diarrhea, unspecified: Secondary | ICD-10-CM | POA: Diagnosis not present

## 2024-03-10 DIAGNOSIS — N186 End stage renal disease: Secondary | ICD-10-CM | POA: Diagnosis not present

## 2024-03-13 DIAGNOSIS — N2581 Secondary hyperparathyroidism of renal origin: Secondary | ICD-10-CM | POA: Diagnosis not present

## 2024-03-13 DIAGNOSIS — R197 Diarrhea, unspecified: Secondary | ICD-10-CM | POA: Diagnosis not present

## 2024-03-13 DIAGNOSIS — T8610 Unspecified complication of kidney transplant: Secondary | ICD-10-CM | POA: Diagnosis not present

## 2024-03-13 DIAGNOSIS — D689 Coagulation defect, unspecified: Secondary | ICD-10-CM | POA: Diagnosis not present

## 2024-03-13 DIAGNOSIS — N186 End stage renal disease: Secondary | ICD-10-CM | POA: Diagnosis not present

## 2024-03-13 DIAGNOSIS — Z992 Dependence on renal dialysis: Secondary | ICD-10-CM | POA: Diagnosis not present

## 2024-03-13 DIAGNOSIS — Z5181 Encounter for therapeutic drug level monitoring: Secondary | ICD-10-CM | POA: Diagnosis not present

## 2024-03-13 DIAGNOSIS — D631 Anemia in chronic kidney disease: Secondary | ICD-10-CM | POA: Diagnosis not present

## 2024-03-15 DIAGNOSIS — D689 Coagulation defect, unspecified: Secondary | ICD-10-CM | POA: Diagnosis not present

## 2024-03-15 DIAGNOSIS — D631 Anemia in chronic kidney disease: Secondary | ICD-10-CM | POA: Diagnosis not present

## 2024-03-15 DIAGNOSIS — Z992 Dependence on renal dialysis: Secondary | ICD-10-CM | POA: Diagnosis not present

## 2024-03-15 DIAGNOSIS — N186 End stage renal disease: Secondary | ICD-10-CM | POA: Diagnosis not present

## 2024-03-15 DIAGNOSIS — Z5181 Encounter for therapeutic drug level monitoring: Secondary | ICD-10-CM | POA: Diagnosis not present

## 2024-03-15 DIAGNOSIS — N2581 Secondary hyperparathyroidism of renal origin: Secondary | ICD-10-CM | POA: Diagnosis not present

## 2024-03-17 DIAGNOSIS — D631 Anemia in chronic kidney disease: Secondary | ICD-10-CM | POA: Diagnosis not present

## 2024-03-17 DIAGNOSIS — D689 Coagulation defect, unspecified: Secondary | ICD-10-CM | POA: Diagnosis not present

## 2024-03-17 DIAGNOSIS — Z992 Dependence on renal dialysis: Secondary | ICD-10-CM | POA: Diagnosis not present

## 2024-03-17 DIAGNOSIS — N186 End stage renal disease: Secondary | ICD-10-CM | POA: Diagnosis not present

## 2024-03-17 DIAGNOSIS — N2581 Secondary hyperparathyroidism of renal origin: Secondary | ICD-10-CM | POA: Diagnosis not present

## 2024-03-17 DIAGNOSIS — Z5181 Encounter for therapeutic drug level monitoring: Secondary | ICD-10-CM | POA: Diagnosis not present

## 2024-03-20 DIAGNOSIS — N186 End stage renal disease: Secondary | ICD-10-CM | POA: Diagnosis not present

## 2024-03-20 DIAGNOSIS — D689 Coagulation defect, unspecified: Secondary | ICD-10-CM | POA: Diagnosis not present

## 2024-03-20 DIAGNOSIS — Z5181 Encounter for therapeutic drug level monitoring: Secondary | ICD-10-CM | POA: Diagnosis not present

## 2024-03-20 DIAGNOSIS — N2581 Secondary hyperparathyroidism of renal origin: Secondary | ICD-10-CM | POA: Diagnosis not present

## 2024-03-20 DIAGNOSIS — Z992 Dependence on renal dialysis: Secondary | ICD-10-CM | POA: Diagnosis not present

## 2024-03-20 DIAGNOSIS — D631 Anemia in chronic kidney disease: Secondary | ICD-10-CM | POA: Diagnosis not present

## 2024-03-22 DIAGNOSIS — D631 Anemia in chronic kidney disease: Secondary | ICD-10-CM | POA: Diagnosis not present

## 2024-03-22 DIAGNOSIS — N186 End stage renal disease: Secondary | ICD-10-CM | POA: Diagnosis not present

## 2024-03-22 DIAGNOSIS — Z992 Dependence on renal dialysis: Secondary | ICD-10-CM | POA: Diagnosis not present

## 2024-03-22 DIAGNOSIS — Z5181 Encounter for therapeutic drug level monitoring: Secondary | ICD-10-CM | POA: Diagnosis not present

## 2024-03-22 DIAGNOSIS — D689 Coagulation defect, unspecified: Secondary | ICD-10-CM | POA: Diagnosis not present

## 2024-03-22 DIAGNOSIS — N2581 Secondary hyperparathyroidism of renal origin: Secondary | ICD-10-CM | POA: Diagnosis not present

## 2024-03-24 DIAGNOSIS — Z5181 Encounter for therapeutic drug level monitoring: Secondary | ICD-10-CM | POA: Diagnosis not present

## 2024-03-24 DIAGNOSIS — N186 End stage renal disease: Secondary | ICD-10-CM | POA: Diagnosis not present

## 2024-03-24 DIAGNOSIS — D689 Coagulation defect, unspecified: Secondary | ICD-10-CM | POA: Diagnosis not present

## 2024-03-24 DIAGNOSIS — Z992 Dependence on renal dialysis: Secondary | ICD-10-CM | POA: Diagnosis not present

## 2024-03-24 DIAGNOSIS — D631 Anemia in chronic kidney disease: Secondary | ICD-10-CM | POA: Diagnosis not present

## 2024-03-24 DIAGNOSIS — N2581 Secondary hyperparathyroidism of renal origin: Secondary | ICD-10-CM | POA: Diagnosis not present

## 2024-03-27 DIAGNOSIS — N186 End stage renal disease: Secondary | ICD-10-CM | POA: Diagnosis not present

## 2024-03-27 DIAGNOSIS — N2581 Secondary hyperparathyroidism of renal origin: Secondary | ICD-10-CM | POA: Diagnosis not present

## 2024-03-27 DIAGNOSIS — Z992 Dependence on renal dialysis: Secondary | ICD-10-CM | POA: Diagnosis not present

## 2024-03-27 DIAGNOSIS — D689 Coagulation defect, unspecified: Secondary | ICD-10-CM | POA: Diagnosis not present

## 2024-03-27 DIAGNOSIS — Z5181 Encounter for therapeutic drug level monitoring: Secondary | ICD-10-CM | POA: Diagnosis not present

## 2024-03-27 DIAGNOSIS — D631 Anemia in chronic kidney disease: Secondary | ICD-10-CM | POA: Diagnosis not present

## 2024-03-29 DIAGNOSIS — Z992 Dependence on renal dialysis: Secondary | ICD-10-CM | POA: Diagnosis not present

## 2024-03-29 DIAGNOSIS — D631 Anemia in chronic kidney disease: Secondary | ICD-10-CM | POA: Diagnosis not present

## 2024-03-29 DIAGNOSIS — D689 Coagulation defect, unspecified: Secondary | ICD-10-CM | POA: Diagnosis not present

## 2024-03-29 DIAGNOSIS — N186 End stage renal disease: Secondary | ICD-10-CM | POA: Diagnosis not present

## 2024-03-29 DIAGNOSIS — N2581 Secondary hyperparathyroidism of renal origin: Secondary | ICD-10-CM | POA: Diagnosis not present

## 2024-03-29 DIAGNOSIS — Z5181 Encounter for therapeutic drug level monitoring: Secondary | ICD-10-CM | POA: Diagnosis not present

## 2024-03-31 DIAGNOSIS — N2581 Secondary hyperparathyroidism of renal origin: Secondary | ICD-10-CM | POA: Diagnosis not present

## 2024-03-31 DIAGNOSIS — Z992 Dependence on renal dialysis: Secondary | ICD-10-CM | POA: Diagnosis not present

## 2024-03-31 DIAGNOSIS — D689 Coagulation defect, unspecified: Secondary | ICD-10-CM | POA: Diagnosis not present

## 2024-03-31 DIAGNOSIS — N186 End stage renal disease: Secondary | ICD-10-CM | POA: Diagnosis not present

## 2024-03-31 DIAGNOSIS — Z5181 Encounter for therapeutic drug level monitoring: Secondary | ICD-10-CM | POA: Diagnosis not present

## 2024-03-31 DIAGNOSIS — D631 Anemia in chronic kidney disease: Secondary | ICD-10-CM | POA: Diagnosis not present

## 2024-04-03 DIAGNOSIS — N2581 Secondary hyperparathyroidism of renal origin: Secondary | ICD-10-CM | POA: Diagnosis not present

## 2024-04-03 DIAGNOSIS — D631 Anemia in chronic kidney disease: Secondary | ICD-10-CM | POA: Diagnosis not present

## 2024-04-03 DIAGNOSIS — D689 Coagulation defect, unspecified: Secondary | ICD-10-CM | POA: Diagnosis not present

## 2024-04-03 DIAGNOSIS — N186 End stage renal disease: Secondary | ICD-10-CM | POA: Diagnosis not present

## 2024-04-03 DIAGNOSIS — Z992 Dependence on renal dialysis: Secondary | ICD-10-CM | POA: Diagnosis not present

## 2024-04-03 DIAGNOSIS — Z5181 Encounter for therapeutic drug level monitoring: Secondary | ICD-10-CM | POA: Diagnosis not present

## 2024-04-05 DIAGNOSIS — D631 Anemia in chronic kidney disease: Secondary | ICD-10-CM | POA: Diagnosis not present

## 2024-04-05 DIAGNOSIS — Z5181 Encounter for therapeutic drug level monitoring: Secondary | ICD-10-CM | POA: Diagnosis not present

## 2024-04-05 DIAGNOSIS — Z992 Dependence on renal dialysis: Secondary | ICD-10-CM | POA: Diagnosis not present

## 2024-04-05 DIAGNOSIS — N2581 Secondary hyperparathyroidism of renal origin: Secondary | ICD-10-CM | POA: Diagnosis not present

## 2024-04-05 DIAGNOSIS — N186 End stage renal disease: Secondary | ICD-10-CM | POA: Diagnosis not present

## 2024-04-05 DIAGNOSIS — D689 Coagulation defect, unspecified: Secondary | ICD-10-CM | POA: Diagnosis not present

## 2024-04-07 DIAGNOSIS — N186 End stage renal disease: Secondary | ICD-10-CM | POA: Diagnosis not present

## 2024-04-07 DIAGNOSIS — D689 Coagulation defect, unspecified: Secondary | ICD-10-CM | POA: Diagnosis not present

## 2024-04-07 DIAGNOSIS — D631 Anemia in chronic kidney disease: Secondary | ICD-10-CM | POA: Diagnosis not present

## 2024-04-07 DIAGNOSIS — N2581 Secondary hyperparathyroidism of renal origin: Secondary | ICD-10-CM | POA: Diagnosis not present

## 2024-04-07 DIAGNOSIS — Z5181 Encounter for therapeutic drug level monitoring: Secondary | ICD-10-CM | POA: Diagnosis not present

## 2024-04-07 DIAGNOSIS — Z992 Dependence on renal dialysis: Secondary | ICD-10-CM | POA: Diagnosis not present

## 2024-04-10 DIAGNOSIS — Z992 Dependence on renal dialysis: Secondary | ICD-10-CM | POA: Diagnosis not present

## 2024-04-10 DIAGNOSIS — D689 Coagulation defect, unspecified: Secondary | ICD-10-CM | POA: Diagnosis not present

## 2024-04-10 DIAGNOSIS — N2581 Secondary hyperparathyroidism of renal origin: Secondary | ICD-10-CM | POA: Diagnosis not present

## 2024-04-10 DIAGNOSIS — Z5181 Encounter for therapeutic drug level monitoring: Secondary | ICD-10-CM | POA: Diagnosis not present

## 2024-04-10 DIAGNOSIS — N186 End stage renal disease: Secondary | ICD-10-CM | POA: Diagnosis not present

## 2024-04-10 DIAGNOSIS — D631 Anemia in chronic kidney disease: Secondary | ICD-10-CM | POA: Diagnosis not present

## 2024-04-12 DIAGNOSIS — Z992 Dependence on renal dialysis: Secondary | ICD-10-CM | POA: Diagnosis not present

## 2024-04-12 DIAGNOSIS — N2581 Secondary hyperparathyroidism of renal origin: Secondary | ICD-10-CM | POA: Diagnosis not present

## 2024-04-12 DIAGNOSIS — T8610 Unspecified complication of kidney transplant: Secondary | ICD-10-CM | POA: Diagnosis not present

## 2024-04-12 DIAGNOSIS — D631 Anemia in chronic kidney disease: Secondary | ICD-10-CM | POA: Diagnosis not present

## 2024-04-12 DIAGNOSIS — D689 Coagulation defect, unspecified: Secondary | ICD-10-CM | POA: Diagnosis not present

## 2024-04-12 DIAGNOSIS — Z5181 Encounter for therapeutic drug level monitoring: Secondary | ICD-10-CM | POA: Diagnosis not present

## 2024-04-12 DIAGNOSIS — N186 End stage renal disease: Secondary | ICD-10-CM | POA: Diagnosis not present

## 2024-04-14 DIAGNOSIS — N2581 Secondary hyperparathyroidism of renal origin: Secondary | ICD-10-CM | POA: Diagnosis not present

## 2024-04-14 DIAGNOSIS — R519 Headache, unspecified: Secondary | ICD-10-CM | POA: Diagnosis not present

## 2024-04-14 DIAGNOSIS — Z992 Dependence on renal dialysis: Secondary | ICD-10-CM | POA: Diagnosis not present

## 2024-04-14 DIAGNOSIS — N186 End stage renal disease: Secondary | ICD-10-CM | POA: Diagnosis not present

## 2024-04-14 DIAGNOSIS — D631 Anemia in chronic kidney disease: Secondary | ICD-10-CM | POA: Diagnosis not present

## 2024-04-14 DIAGNOSIS — Z5181 Encounter for therapeutic drug level monitoring: Secondary | ICD-10-CM | POA: Diagnosis not present

## 2024-04-14 DIAGNOSIS — E875 Hyperkalemia: Secondary | ICD-10-CM | POA: Diagnosis not present

## 2024-04-14 DIAGNOSIS — D689 Coagulation defect, unspecified: Secondary | ICD-10-CM | POA: Diagnosis not present

## 2024-04-17 DIAGNOSIS — E875 Hyperkalemia: Secondary | ICD-10-CM | POA: Diagnosis not present

## 2024-04-17 DIAGNOSIS — D631 Anemia in chronic kidney disease: Secondary | ICD-10-CM | POA: Diagnosis not present

## 2024-04-17 DIAGNOSIS — R519 Headache, unspecified: Secondary | ICD-10-CM | POA: Diagnosis not present

## 2024-04-17 DIAGNOSIS — N186 End stage renal disease: Secondary | ICD-10-CM | POA: Diagnosis not present

## 2024-04-17 DIAGNOSIS — Z992 Dependence on renal dialysis: Secondary | ICD-10-CM | POA: Diagnosis not present

## 2024-04-17 DIAGNOSIS — N2581 Secondary hyperparathyroidism of renal origin: Secondary | ICD-10-CM | POA: Diagnosis not present

## 2024-04-17 DIAGNOSIS — Z5181 Encounter for therapeutic drug level monitoring: Secondary | ICD-10-CM | POA: Diagnosis not present

## 2024-04-17 DIAGNOSIS — D689 Coagulation defect, unspecified: Secondary | ICD-10-CM | POA: Diagnosis not present

## 2024-04-19 DIAGNOSIS — E875 Hyperkalemia: Secondary | ICD-10-CM | POA: Diagnosis not present

## 2024-04-19 DIAGNOSIS — N186 End stage renal disease: Secondary | ICD-10-CM | POA: Diagnosis not present

## 2024-04-19 DIAGNOSIS — R519 Headache, unspecified: Secondary | ICD-10-CM | POA: Diagnosis not present

## 2024-04-19 DIAGNOSIS — Z5181 Encounter for therapeutic drug level monitoring: Secondary | ICD-10-CM | POA: Diagnosis not present

## 2024-04-19 DIAGNOSIS — D631 Anemia in chronic kidney disease: Secondary | ICD-10-CM | POA: Diagnosis not present

## 2024-04-19 DIAGNOSIS — D689 Coagulation defect, unspecified: Secondary | ICD-10-CM | POA: Diagnosis not present

## 2024-04-19 DIAGNOSIS — Z992 Dependence on renal dialysis: Secondary | ICD-10-CM | POA: Diagnosis not present

## 2024-04-19 DIAGNOSIS — N2581 Secondary hyperparathyroidism of renal origin: Secondary | ICD-10-CM | POA: Diagnosis not present

## 2024-04-21 DIAGNOSIS — N2581 Secondary hyperparathyroidism of renal origin: Secondary | ICD-10-CM | POA: Diagnosis not present

## 2024-04-21 DIAGNOSIS — N186 End stage renal disease: Secondary | ICD-10-CM | POA: Diagnosis not present

## 2024-04-21 DIAGNOSIS — D631 Anemia in chronic kidney disease: Secondary | ICD-10-CM | POA: Diagnosis not present

## 2024-04-21 DIAGNOSIS — Z5181 Encounter for therapeutic drug level monitoring: Secondary | ICD-10-CM | POA: Diagnosis not present

## 2024-04-21 DIAGNOSIS — D689 Coagulation defect, unspecified: Secondary | ICD-10-CM | POA: Diagnosis not present

## 2024-04-21 DIAGNOSIS — Z992 Dependence on renal dialysis: Secondary | ICD-10-CM | POA: Diagnosis not present

## 2024-04-21 DIAGNOSIS — R519 Headache, unspecified: Secondary | ICD-10-CM | POA: Diagnosis not present

## 2024-04-21 DIAGNOSIS — E875 Hyperkalemia: Secondary | ICD-10-CM | POA: Diagnosis not present

## 2024-04-24 DIAGNOSIS — D631 Anemia in chronic kidney disease: Secondary | ICD-10-CM | POA: Diagnosis not present

## 2024-04-24 DIAGNOSIS — Z5181 Encounter for therapeutic drug level monitoring: Secondary | ICD-10-CM | POA: Diagnosis not present

## 2024-04-24 DIAGNOSIS — N2581 Secondary hyperparathyroidism of renal origin: Secondary | ICD-10-CM | POA: Diagnosis not present

## 2024-04-24 DIAGNOSIS — E875 Hyperkalemia: Secondary | ICD-10-CM | POA: Diagnosis not present

## 2024-04-24 DIAGNOSIS — R519 Headache, unspecified: Secondary | ICD-10-CM | POA: Diagnosis not present

## 2024-04-24 DIAGNOSIS — N186 End stage renal disease: Secondary | ICD-10-CM | POA: Diagnosis not present

## 2024-04-24 DIAGNOSIS — D689 Coagulation defect, unspecified: Secondary | ICD-10-CM | POA: Diagnosis not present

## 2024-04-24 DIAGNOSIS — Z992 Dependence on renal dialysis: Secondary | ICD-10-CM | POA: Diagnosis not present

## 2024-04-26 DIAGNOSIS — E875 Hyperkalemia: Secondary | ICD-10-CM | POA: Diagnosis not present

## 2024-04-26 DIAGNOSIS — D689 Coagulation defect, unspecified: Secondary | ICD-10-CM | POA: Diagnosis not present

## 2024-04-26 DIAGNOSIS — D631 Anemia in chronic kidney disease: Secondary | ICD-10-CM | POA: Diagnosis not present

## 2024-04-26 DIAGNOSIS — R519 Headache, unspecified: Secondary | ICD-10-CM | POA: Diagnosis not present

## 2024-04-26 DIAGNOSIS — Z5181 Encounter for therapeutic drug level monitoring: Secondary | ICD-10-CM | POA: Diagnosis not present

## 2024-04-26 DIAGNOSIS — N186 End stage renal disease: Secondary | ICD-10-CM | POA: Diagnosis not present

## 2024-04-26 DIAGNOSIS — Z992 Dependence on renal dialysis: Secondary | ICD-10-CM | POA: Diagnosis not present

## 2024-04-26 DIAGNOSIS — N2581 Secondary hyperparathyroidism of renal origin: Secondary | ICD-10-CM | POA: Diagnosis not present

## 2024-04-27 DIAGNOSIS — R519 Headache, unspecified: Secondary | ICD-10-CM | POA: Diagnosis not present

## 2024-04-27 DIAGNOSIS — E875 Hyperkalemia: Secondary | ICD-10-CM | POA: Diagnosis not present

## 2024-04-27 DIAGNOSIS — N186 End stage renal disease: Secondary | ICD-10-CM | POA: Diagnosis not present

## 2024-04-27 DIAGNOSIS — D631 Anemia in chronic kidney disease: Secondary | ICD-10-CM | POA: Diagnosis not present

## 2024-04-27 DIAGNOSIS — Z5181 Encounter for therapeutic drug level monitoring: Secondary | ICD-10-CM | POA: Diagnosis not present

## 2024-04-27 DIAGNOSIS — Z992 Dependence on renal dialysis: Secondary | ICD-10-CM | POA: Diagnosis not present

## 2024-04-27 DIAGNOSIS — D689 Coagulation defect, unspecified: Secondary | ICD-10-CM | POA: Diagnosis not present

## 2024-04-27 DIAGNOSIS — N2581 Secondary hyperparathyroidism of renal origin: Secondary | ICD-10-CM | POA: Diagnosis not present

## 2024-04-28 DIAGNOSIS — R519 Headache, unspecified: Secondary | ICD-10-CM | POA: Diagnosis not present

## 2024-04-28 DIAGNOSIS — E875 Hyperkalemia: Secondary | ICD-10-CM | POA: Diagnosis not present

## 2024-04-28 DIAGNOSIS — Z992 Dependence on renal dialysis: Secondary | ICD-10-CM | POA: Diagnosis not present

## 2024-04-28 DIAGNOSIS — N2581 Secondary hyperparathyroidism of renal origin: Secondary | ICD-10-CM | POA: Diagnosis not present

## 2024-04-28 DIAGNOSIS — D689 Coagulation defect, unspecified: Secondary | ICD-10-CM | POA: Diagnosis not present

## 2024-04-28 DIAGNOSIS — Z5181 Encounter for therapeutic drug level monitoring: Secondary | ICD-10-CM | POA: Diagnosis not present

## 2024-04-28 DIAGNOSIS — D631 Anemia in chronic kidney disease: Secondary | ICD-10-CM | POA: Diagnosis not present

## 2024-04-28 DIAGNOSIS — N186 End stage renal disease: Secondary | ICD-10-CM | POA: Diagnosis not present

## 2024-05-01 DIAGNOSIS — D631 Anemia in chronic kidney disease: Secondary | ICD-10-CM | POA: Diagnosis not present

## 2024-05-01 DIAGNOSIS — R519 Headache, unspecified: Secondary | ICD-10-CM | POA: Diagnosis not present

## 2024-05-01 DIAGNOSIS — N186 End stage renal disease: Secondary | ICD-10-CM | POA: Diagnosis not present

## 2024-05-01 DIAGNOSIS — Z992 Dependence on renal dialysis: Secondary | ICD-10-CM | POA: Diagnosis not present

## 2024-05-01 DIAGNOSIS — D689 Coagulation defect, unspecified: Secondary | ICD-10-CM | POA: Diagnosis not present

## 2024-05-01 DIAGNOSIS — N2581 Secondary hyperparathyroidism of renal origin: Secondary | ICD-10-CM | POA: Diagnosis not present

## 2024-05-01 DIAGNOSIS — E875 Hyperkalemia: Secondary | ICD-10-CM | POA: Diagnosis not present

## 2024-05-01 DIAGNOSIS — Z5181 Encounter for therapeutic drug level monitoring: Secondary | ICD-10-CM | POA: Diagnosis not present

## 2024-05-03 DIAGNOSIS — N2581 Secondary hyperparathyroidism of renal origin: Secondary | ICD-10-CM | POA: Diagnosis not present

## 2024-05-03 DIAGNOSIS — E875 Hyperkalemia: Secondary | ICD-10-CM | POA: Diagnosis not present

## 2024-05-03 DIAGNOSIS — N186 End stage renal disease: Secondary | ICD-10-CM | POA: Diagnosis not present

## 2024-05-03 DIAGNOSIS — R519 Headache, unspecified: Secondary | ICD-10-CM | POA: Diagnosis not present

## 2024-05-03 DIAGNOSIS — Z992 Dependence on renal dialysis: Secondary | ICD-10-CM | POA: Diagnosis not present

## 2024-05-03 DIAGNOSIS — D689 Coagulation defect, unspecified: Secondary | ICD-10-CM | POA: Diagnosis not present

## 2024-05-03 DIAGNOSIS — Z5181 Encounter for therapeutic drug level monitoring: Secondary | ICD-10-CM | POA: Diagnosis not present

## 2024-05-03 DIAGNOSIS — D631 Anemia in chronic kidney disease: Secondary | ICD-10-CM | POA: Diagnosis not present

## 2024-05-06 DIAGNOSIS — D631 Anemia in chronic kidney disease: Secondary | ICD-10-CM | POA: Diagnosis not present

## 2024-05-06 DIAGNOSIS — D689 Coagulation defect, unspecified: Secondary | ICD-10-CM | POA: Diagnosis not present

## 2024-05-06 DIAGNOSIS — N2581 Secondary hyperparathyroidism of renal origin: Secondary | ICD-10-CM | POA: Diagnosis not present

## 2024-05-06 DIAGNOSIS — N186 End stage renal disease: Secondary | ICD-10-CM | POA: Diagnosis not present

## 2024-05-06 DIAGNOSIS — Z5181 Encounter for therapeutic drug level monitoring: Secondary | ICD-10-CM | POA: Diagnosis not present

## 2024-05-06 DIAGNOSIS — Z992 Dependence on renal dialysis: Secondary | ICD-10-CM | POA: Diagnosis not present

## 2024-05-06 DIAGNOSIS — R519 Headache, unspecified: Secondary | ICD-10-CM | POA: Diagnosis not present

## 2024-05-06 DIAGNOSIS — E875 Hyperkalemia: Secondary | ICD-10-CM | POA: Diagnosis not present

## 2024-05-08 DIAGNOSIS — E875 Hyperkalemia: Secondary | ICD-10-CM | POA: Diagnosis not present

## 2024-05-08 DIAGNOSIS — Z992 Dependence on renal dialysis: Secondary | ICD-10-CM | POA: Diagnosis not present

## 2024-05-08 DIAGNOSIS — D631 Anemia in chronic kidney disease: Secondary | ICD-10-CM | POA: Diagnosis not present

## 2024-05-08 DIAGNOSIS — N186 End stage renal disease: Secondary | ICD-10-CM | POA: Diagnosis not present

## 2024-05-08 DIAGNOSIS — N2581 Secondary hyperparathyroidism of renal origin: Secondary | ICD-10-CM | POA: Diagnosis not present

## 2024-05-08 DIAGNOSIS — D689 Coagulation defect, unspecified: Secondary | ICD-10-CM | POA: Diagnosis not present

## 2024-05-08 DIAGNOSIS — R519 Headache, unspecified: Secondary | ICD-10-CM | POA: Diagnosis not present

## 2024-05-08 DIAGNOSIS — Z5181 Encounter for therapeutic drug level monitoring: Secondary | ICD-10-CM | POA: Diagnosis not present

## 2024-05-10 DIAGNOSIS — Z5181 Encounter for therapeutic drug level monitoring: Secondary | ICD-10-CM | POA: Diagnosis not present

## 2024-05-10 DIAGNOSIS — D689 Coagulation defect, unspecified: Secondary | ICD-10-CM | POA: Diagnosis not present

## 2024-05-10 DIAGNOSIS — E875 Hyperkalemia: Secondary | ICD-10-CM | POA: Diagnosis not present

## 2024-05-10 DIAGNOSIS — N186 End stage renal disease: Secondary | ICD-10-CM | POA: Diagnosis not present

## 2024-05-10 DIAGNOSIS — Z992 Dependence on renal dialysis: Secondary | ICD-10-CM | POA: Diagnosis not present

## 2024-05-10 DIAGNOSIS — D631 Anemia in chronic kidney disease: Secondary | ICD-10-CM | POA: Diagnosis not present

## 2024-05-10 DIAGNOSIS — R519 Headache, unspecified: Secondary | ICD-10-CM | POA: Diagnosis not present

## 2024-05-10 DIAGNOSIS — N2581 Secondary hyperparathyroidism of renal origin: Secondary | ICD-10-CM | POA: Diagnosis not present

## 2024-05-12 DIAGNOSIS — N2581 Secondary hyperparathyroidism of renal origin: Secondary | ICD-10-CM | POA: Diagnosis not present

## 2024-05-12 DIAGNOSIS — Z5181 Encounter for therapeutic drug level monitoring: Secondary | ICD-10-CM | POA: Diagnosis not present

## 2024-05-12 DIAGNOSIS — N186 End stage renal disease: Secondary | ICD-10-CM | POA: Diagnosis not present

## 2024-05-12 DIAGNOSIS — E875 Hyperkalemia: Secondary | ICD-10-CM | POA: Diagnosis not present

## 2024-05-12 DIAGNOSIS — D689 Coagulation defect, unspecified: Secondary | ICD-10-CM | POA: Diagnosis not present

## 2024-05-12 DIAGNOSIS — Z992 Dependence on renal dialysis: Secondary | ICD-10-CM | POA: Diagnosis not present

## 2024-05-12 DIAGNOSIS — D631 Anemia in chronic kidney disease: Secondary | ICD-10-CM | POA: Diagnosis not present

## 2024-05-12 DIAGNOSIS — R519 Headache, unspecified: Secondary | ICD-10-CM | POA: Diagnosis not present

## 2024-05-13 DIAGNOSIS — Z992 Dependence on renal dialysis: Secondary | ICD-10-CM | POA: Diagnosis not present

## 2024-05-13 DIAGNOSIS — T8610 Unspecified complication of kidney transplant: Secondary | ICD-10-CM | POA: Diagnosis not present

## 2024-05-13 DIAGNOSIS — N186 End stage renal disease: Secondary | ICD-10-CM | POA: Diagnosis not present

## 2024-05-15 DIAGNOSIS — N2581 Secondary hyperparathyroidism of renal origin: Secondary | ICD-10-CM | POA: Diagnosis not present

## 2024-05-15 DIAGNOSIS — D689 Coagulation defect, unspecified: Secondary | ICD-10-CM | POA: Diagnosis not present

## 2024-05-15 DIAGNOSIS — R519 Headache, unspecified: Secondary | ICD-10-CM | POA: Diagnosis not present

## 2024-05-15 DIAGNOSIS — Z5181 Encounter for therapeutic drug level monitoring: Secondary | ICD-10-CM | POA: Diagnosis not present

## 2024-05-15 DIAGNOSIS — Z992 Dependence on renal dialysis: Secondary | ICD-10-CM | POA: Diagnosis not present

## 2024-05-15 DIAGNOSIS — R197 Diarrhea, unspecified: Secondary | ICD-10-CM | POA: Diagnosis not present

## 2024-05-15 DIAGNOSIS — D631 Anemia in chronic kidney disease: Secondary | ICD-10-CM | POA: Diagnosis not present

## 2024-05-15 DIAGNOSIS — N186 End stage renal disease: Secondary | ICD-10-CM | POA: Diagnosis not present

## 2024-05-15 DIAGNOSIS — L299 Pruritus, unspecified: Secondary | ICD-10-CM | POA: Diagnosis not present

## 2024-05-17 DIAGNOSIS — D631 Anemia in chronic kidney disease: Secondary | ICD-10-CM | POA: Diagnosis not present

## 2024-05-17 DIAGNOSIS — R197 Diarrhea, unspecified: Secondary | ICD-10-CM | POA: Diagnosis not present

## 2024-05-17 DIAGNOSIS — Z5181 Encounter for therapeutic drug level monitoring: Secondary | ICD-10-CM | POA: Diagnosis not present

## 2024-05-17 DIAGNOSIS — N2581 Secondary hyperparathyroidism of renal origin: Secondary | ICD-10-CM | POA: Diagnosis not present

## 2024-05-17 DIAGNOSIS — N186 End stage renal disease: Secondary | ICD-10-CM | POA: Diagnosis not present

## 2024-05-17 DIAGNOSIS — D689 Coagulation defect, unspecified: Secondary | ICD-10-CM | POA: Diagnosis not present

## 2024-05-17 DIAGNOSIS — R519 Headache, unspecified: Secondary | ICD-10-CM | POA: Diagnosis not present

## 2024-05-17 DIAGNOSIS — L299 Pruritus, unspecified: Secondary | ICD-10-CM | POA: Diagnosis not present

## 2024-05-17 DIAGNOSIS — Z992 Dependence on renal dialysis: Secondary | ICD-10-CM | POA: Diagnosis not present

## 2024-05-19 DIAGNOSIS — Z5181 Encounter for therapeutic drug level monitoring: Secondary | ICD-10-CM | POA: Diagnosis not present

## 2024-05-19 DIAGNOSIS — N2581 Secondary hyperparathyroidism of renal origin: Secondary | ICD-10-CM | POA: Diagnosis not present

## 2024-05-19 DIAGNOSIS — D689 Coagulation defect, unspecified: Secondary | ICD-10-CM | POA: Diagnosis not present

## 2024-05-19 DIAGNOSIS — L299 Pruritus, unspecified: Secondary | ICD-10-CM | POA: Diagnosis not present

## 2024-05-19 DIAGNOSIS — Z992 Dependence on renal dialysis: Secondary | ICD-10-CM | POA: Diagnosis not present

## 2024-05-19 DIAGNOSIS — R197 Diarrhea, unspecified: Secondary | ICD-10-CM | POA: Diagnosis not present

## 2024-05-19 DIAGNOSIS — N186 End stage renal disease: Secondary | ICD-10-CM | POA: Diagnosis not present

## 2024-05-19 DIAGNOSIS — R519 Headache, unspecified: Secondary | ICD-10-CM | POA: Diagnosis not present

## 2024-05-19 DIAGNOSIS — D631 Anemia in chronic kidney disease: Secondary | ICD-10-CM | POA: Diagnosis not present

## 2024-05-22 DIAGNOSIS — Z992 Dependence on renal dialysis: Secondary | ICD-10-CM | POA: Diagnosis not present

## 2024-05-22 DIAGNOSIS — D689 Coagulation defect, unspecified: Secondary | ICD-10-CM | POA: Diagnosis not present

## 2024-05-22 DIAGNOSIS — R197 Diarrhea, unspecified: Secondary | ICD-10-CM | POA: Diagnosis not present

## 2024-05-22 DIAGNOSIS — Z5181 Encounter for therapeutic drug level monitoring: Secondary | ICD-10-CM | POA: Diagnosis not present

## 2024-05-22 DIAGNOSIS — N2581 Secondary hyperparathyroidism of renal origin: Secondary | ICD-10-CM | POA: Diagnosis not present

## 2024-05-22 DIAGNOSIS — R519 Headache, unspecified: Secondary | ICD-10-CM | POA: Diagnosis not present

## 2024-05-22 DIAGNOSIS — N186 End stage renal disease: Secondary | ICD-10-CM | POA: Diagnosis not present

## 2024-05-22 DIAGNOSIS — D631 Anemia in chronic kidney disease: Secondary | ICD-10-CM | POA: Diagnosis not present

## 2024-05-22 DIAGNOSIS — L299 Pruritus, unspecified: Secondary | ICD-10-CM | POA: Diagnosis not present

## 2024-05-24 DIAGNOSIS — R519 Headache, unspecified: Secondary | ICD-10-CM | POA: Diagnosis not present

## 2024-05-24 DIAGNOSIS — L299 Pruritus, unspecified: Secondary | ICD-10-CM | POA: Diagnosis not present

## 2024-05-24 DIAGNOSIS — N186 End stage renal disease: Secondary | ICD-10-CM | POA: Diagnosis not present

## 2024-05-24 DIAGNOSIS — R197 Diarrhea, unspecified: Secondary | ICD-10-CM | POA: Diagnosis not present

## 2024-05-24 DIAGNOSIS — N2581 Secondary hyperparathyroidism of renal origin: Secondary | ICD-10-CM | POA: Diagnosis not present

## 2024-05-24 DIAGNOSIS — D631 Anemia in chronic kidney disease: Secondary | ICD-10-CM | POA: Diagnosis not present

## 2024-05-24 DIAGNOSIS — D689 Coagulation defect, unspecified: Secondary | ICD-10-CM | POA: Diagnosis not present

## 2024-05-24 DIAGNOSIS — Z5181 Encounter for therapeutic drug level monitoring: Secondary | ICD-10-CM | POA: Diagnosis not present

## 2024-05-24 DIAGNOSIS — Z992 Dependence on renal dialysis: Secondary | ICD-10-CM | POA: Diagnosis not present

## 2024-05-26 DIAGNOSIS — D631 Anemia in chronic kidney disease: Secondary | ICD-10-CM | POA: Diagnosis not present

## 2024-05-26 DIAGNOSIS — D689 Coagulation defect, unspecified: Secondary | ICD-10-CM | POA: Diagnosis not present

## 2024-05-26 DIAGNOSIS — Z992 Dependence on renal dialysis: Secondary | ICD-10-CM | POA: Diagnosis not present

## 2024-05-26 DIAGNOSIS — N2581 Secondary hyperparathyroidism of renal origin: Secondary | ICD-10-CM | POA: Diagnosis not present

## 2024-05-26 DIAGNOSIS — Z5181 Encounter for therapeutic drug level monitoring: Secondary | ICD-10-CM | POA: Diagnosis not present

## 2024-05-26 DIAGNOSIS — L299 Pruritus, unspecified: Secondary | ICD-10-CM | POA: Diagnosis not present

## 2024-05-26 DIAGNOSIS — R519 Headache, unspecified: Secondary | ICD-10-CM | POA: Diagnosis not present

## 2024-05-26 DIAGNOSIS — R197 Diarrhea, unspecified: Secondary | ICD-10-CM | POA: Diagnosis not present

## 2024-05-26 DIAGNOSIS — N186 End stage renal disease: Secondary | ICD-10-CM | POA: Diagnosis not present

## 2024-05-29 DIAGNOSIS — R519 Headache, unspecified: Secondary | ICD-10-CM | POA: Diagnosis not present

## 2024-05-29 DIAGNOSIS — D689 Coagulation defect, unspecified: Secondary | ICD-10-CM | POA: Diagnosis not present

## 2024-05-29 DIAGNOSIS — Z992 Dependence on renal dialysis: Secondary | ICD-10-CM | POA: Diagnosis not present

## 2024-05-29 DIAGNOSIS — L299 Pruritus, unspecified: Secondary | ICD-10-CM | POA: Diagnosis not present

## 2024-05-29 DIAGNOSIS — Z5181 Encounter for therapeutic drug level monitoring: Secondary | ICD-10-CM | POA: Diagnosis not present

## 2024-05-29 DIAGNOSIS — N2581 Secondary hyperparathyroidism of renal origin: Secondary | ICD-10-CM | POA: Diagnosis not present

## 2024-05-29 DIAGNOSIS — R197 Diarrhea, unspecified: Secondary | ICD-10-CM | POA: Diagnosis not present

## 2024-05-29 DIAGNOSIS — N186 End stage renal disease: Secondary | ICD-10-CM | POA: Diagnosis not present

## 2024-05-29 DIAGNOSIS — D631 Anemia in chronic kidney disease: Secondary | ICD-10-CM | POA: Diagnosis not present

## 2024-05-31 DIAGNOSIS — Z992 Dependence on renal dialysis: Secondary | ICD-10-CM | POA: Diagnosis not present

## 2024-05-31 DIAGNOSIS — D631 Anemia in chronic kidney disease: Secondary | ICD-10-CM | POA: Diagnosis not present

## 2024-05-31 DIAGNOSIS — N186 End stage renal disease: Secondary | ICD-10-CM | POA: Diagnosis not present

## 2024-05-31 DIAGNOSIS — L299 Pruritus, unspecified: Secondary | ICD-10-CM | POA: Diagnosis not present

## 2024-05-31 DIAGNOSIS — D689 Coagulation defect, unspecified: Secondary | ICD-10-CM | POA: Diagnosis not present

## 2024-05-31 DIAGNOSIS — R519 Headache, unspecified: Secondary | ICD-10-CM | POA: Diagnosis not present

## 2024-05-31 DIAGNOSIS — R197 Diarrhea, unspecified: Secondary | ICD-10-CM | POA: Diagnosis not present

## 2024-05-31 DIAGNOSIS — Z5181 Encounter for therapeutic drug level monitoring: Secondary | ICD-10-CM | POA: Diagnosis not present

## 2024-05-31 DIAGNOSIS — N2581 Secondary hyperparathyroidism of renal origin: Secondary | ICD-10-CM | POA: Diagnosis not present

## 2024-06-02 DIAGNOSIS — R197 Diarrhea, unspecified: Secondary | ICD-10-CM | POA: Diagnosis not present

## 2024-06-02 DIAGNOSIS — L299 Pruritus, unspecified: Secondary | ICD-10-CM | POA: Diagnosis not present

## 2024-06-02 DIAGNOSIS — N186 End stage renal disease: Secondary | ICD-10-CM | POA: Diagnosis not present

## 2024-06-02 DIAGNOSIS — Z992 Dependence on renal dialysis: Secondary | ICD-10-CM | POA: Diagnosis not present

## 2024-06-02 DIAGNOSIS — N2581 Secondary hyperparathyroidism of renal origin: Secondary | ICD-10-CM | POA: Diagnosis not present

## 2024-06-02 DIAGNOSIS — D631 Anemia in chronic kidney disease: Secondary | ICD-10-CM | POA: Diagnosis not present

## 2024-06-02 DIAGNOSIS — D689 Coagulation defect, unspecified: Secondary | ICD-10-CM | POA: Diagnosis not present

## 2024-06-02 DIAGNOSIS — Z5181 Encounter for therapeutic drug level monitoring: Secondary | ICD-10-CM | POA: Diagnosis not present

## 2024-06-02 DIAGNOSIS — R519 Headache, unspecified: Secondary | ICD-10-CM | POA: Diagnosis not present

## 2024-06-05 DIAGNOSIS — N2581 Secondary hyperparathyroidism of renal origin: Secondary | ICD-10-CM | POA: Diagnosis not present

## 2024-06-05 DIAGNOSIS — D631 Anemia in chronic kidney disease: Secondary | ICD-10-CM | POA: Diagnosis not present

## 2024-06-05 DIAGNOSIS — D689 Coagulation defect, unspecified: Secondary | ICD-10-CM | POA: Diagnosis not present

## 2024-06-05 DIAGNOSIS — L299 Pruritus, unspecified: Secondary | ICD-10-CM | POA: Diagnosis not present

## 2024-06-05 DIAGNOSIS — R197 Diarrhea, unspecified: Secondary | ICD-10-CM | POA: Diagnosis not present

## 2024-06-05 DIAGNOSIS — Z992 Dependence on renal dialysis: Secondary | ICD-10-CM | POA: Diagnosis not present

## 2024-06-05 DIAGNOSIS — R519 Headache, unspecified: Secondary | ICD-10-CM | POA: Diagnosis not present

## 2024-06-05 DIAGNOSIS — N186 End stage renal disease: Secondary | ICD-10-CM | POA: Diagnosis not present

## 2024-06-05 DIAGNOSIS — Z5181 Encounter for therapeutic drug level monitoring: Secondary | ICD-10-CM | POA: Diagnosis not present

## 2024-06-07 DIAGNOSIS — Z5181 Encounter for therapeutic drug level monitoring: Secondary | ICD-10-CM | POA: Diagnosis not present

## 2024-06-07 DIAGNOSIS — N2581 Secondary hyperparathyroidism of renal origin: Secondary | ICD-10-CM | POA: Diagnosis not present

## 2024-06-07 DIAGNOSIS — D631 Anemia in chronic kidney disease: Secondary | ICD-10-CM | POA: Diagnosis not present

## 2024-06-07 DIAGNOSIS — R197 Diarrhea, unspecified: Secondary | ICD-10-CM | POA: Diagnosis not present

## 2024-06-07 DIAGNOSIS — Z992 Dependence on renal dialysis: Secondary | ICD-10-CM | POA: Diagnosis not present

## 2024-06-07 DIAGNOSIS — L299 Pruritus, unspecified: Secondary | ICD-10-CM | POA: Diagnosis not present

## 2024-06-07 DIAGNOSIS — R519 Headache, unspecified: Secondary | ICD-10-CM | POA: Diagnosis not present

## 2024-06-07 DIAGNOSIS — N186 End stage renal disease: Secondary | ICD-10-CM | POA: Diagnosis not present

## 2024-06-07 DIAGNOSIS — D689 Coagulation defect, unspecified: Secondary | ICD-10-CM | POA: Diagnosis not present

## 2024-06-09 DIAGNOSIS — N186 End stage renal disease: Secondary | ICD-10-CM | POA: Diagnosis not present

## 2024-06-09 DIAGNOSIS — R197 Diarrhea, unspecified: Secondary | ICD-10-CM | POA: Diagnosis not present

## 2024-06-09 DIAGNOSIS — Z992 Dependence on renal dialysis: Secondary | ICD-10-CM | POA: Diagnosis not present

## 2024-06-09 DIAGNOSIS — L299 Pruritus, unspecified: Secondary | ICD-10-CM | POA: Diagnosis not present

## 2024-06-09 DIAGNOSIS — Z5181 Encounter for therapeutic drug level monitoring: Secondary | ICD-10-CM | POA: Diagnosis not present

## 2024-06-09 DIAGNOSIS — R519 Headache, unspecified: Secondary | ICD-10-CM | POA: Diagnosis not present

## 2024-06-09 DIAGNOSIS — D689 Coagulation defect, unspecified: Secondary | ICD-10-CM | POA: Diagnosis not present

## 2024-06-09 DIAGNOSIS — N2581 Secondary hyperparathyroidism of renal origin: Secondary | ICD-10-CM | POA: Diagnosis not present

## 2024-06-09 DIAGNOSIS — D631 Anemia in chronic kidney disease: Secondary | ICD-10-CM | POA: Diagnosis not present

## 2024-06-12 DIAGNOSIS — T8610 Unspecified complication of kidney transplant: Secondary | ICD-10-CM | POA: Diagnosis not present

## 2024-06-12 DIAGNOSIS — N2581 Secondary hyperparathyroidism of renal origin: Secondary | ICD-10-CM | POA: Diagnosis not present

## 2024-06-12 DIAGNOSIS — Z992 Dependence on renal dialysis: Secondary | ICD-10-CM | POA: Diagnosis not present

## 2024-06-12 DIAGNOSIS — N186 End stage renal disease: Secondary | ICD-10-CM | POA: Diagnosis not present

## 2024-06-12 DIAGNOSIS — D631 Anemia in chronic kidney disease: Secondary | ICD-10-CM | POA: Diagnosis not present

## 2024-06-12 DIAGNOSIS — D689 Coagulation defect, unspecified: Secondary | ICD-10-CM | POA: Diagnosis not present

## 2024-06-12 DIAGNOSIS — R519 Headache, unspecified: Secondary | ICD-10-CM | POA: Diagnosis not present

## 2024-06-12 DIAGNOSIS — R197 Diarrhea, unspecified: Secondary | ICD-10-CM | POA: Diagnosis not present

## 2024-06-12 DIAGNOSIS — Z5181 Encounter for therapeutic drug level monitoring: Secondary | ICD-10-CM | POA: Diagnosis not present

## 2024-06-12 DIAGNOSIS — L299 Pruritus, unspecified: Secondary | ICD-10-CM | POA: Diagnosis not present

## 2024-06-14 DIAGNOSIS — Z5181 Encounter for therapeutic drug level monitoring: Secondary | ICD-10-CM | POA: Diagnosis not present

## 2024-06-14 DIAGNOSIS — D631 Anemia in chronic kidney disease: Secondary | ICD-10-CM | POA: Diagnosis not present

## 2024-06-14 DIAGNOSIS — N186 End stage renal disease: Secondary | ICD-10-CM | POA: Diagnosis not present

## 2024-06-14 DIAGNOSIS — Z992 Dependence on renal dialysis: Secondary | ICD-10-CM | POA: Diagnosis not present

## 2024-06-14 DIAGNOSIS — D689 Coagulation defect, unspecified: Secondary | ICD-10-CM | POA: Diagnosis not present

## 2024-06-14 DIAGNOSIS — N2581 Secondary hyperparathyroidism of renal origin: Secondary | ICD-10-CM | POA: Diagnosis not present

## 2024-06-16 DIAGNOSIS — Z5181 Encounter for therapeutic drug level monitoring: Secondary | ICD-10-CM | POA: Diagnosis not present

## 2024-06-16 DIAGNOSIS — N186 End stage renal disease: Secondary | ICD-10-CM | POA: Diagnosis not present

## 2024-06-16 DIAGNOSIS — D689 Coagulation defect, unspecified: Secondary | ICD-10-CM | POA: Diagnosis not present

## 2024-06-16 DIAGNOSIS — Z992 Dependence on renal dialysis: Secondary | ICD-10-CM | POA: Diagnosis not present

## 2024-06-16 DIAGNOSIS — D631 Anemia in chronic kidney disease: Secondary | ICD-10-CM | POA: Diagnosis not present

## 2024-06-16 DIAGNOSIS — N2581 Secondary hyperparathyroidism of renal origin: Secondary | ICD-10-CM | POA: Diagnosis not present

## 2024-06-19 DIAGNOSIS — Z992 Dependence on renal dialysis: Secondary | ICD-10-CM | POA: Diagnosis not present

## 2024-06-19 DIAGNOSIS — D689 Coagulation defect, unspecified: Secondary | ICD-10-CM | POA: Diagnosis not present

## 2024-06-19 DIAGNOSIS — Z5181 Encounter for therapeutic drug level monitoring: Secondary | ICD-10-CM | POA: Diagnosis not present

## 2024-06-19 DIAGNOSIS — N186 End stage renal disease: Secondary | ICD-10-CM | POA: Diagnosis not present

## 2024-06-19 DIAGNOSIS — N2581 Secondary hyperparathyroidism of renal origin: Secondary | ICD-10-CM | POA: Diagnosis not present

## 2024-06-19 DIAGNOSIS — D631 Anemia in chronic kidney disease: Secondary | ICD-10-CM | POA: Diagnosis not present

## 2024-06-21 DIAGNOSIS — N2581 Secondary hyperparathyroidism of renal origin: Secondary | ICD-10-CM | POA: Diagnosis not present

## 2024-06-21 DIAGNOSIS — N186 End stage renal disease: Secondary | ICD-10-CM | POA: Diagnosis not present

## 2024-06-21 DIAGNOSIS — Z5181 Encounter for therapeutic drug level monitoring: Secondary | ICD-10-CM | POA: Diagnosis not present

## 2024-06-21 DIAGNOSIS — D631 Anemia in chronic kidney disease: Secondary | ICD-10-CM | POA: Diagnosis not present

## 2024-06-21 DIAGNOSIS — D689 Coagulation defect, unspecified: Secondary | ICD-10-CM | POA: Diagnosis not present

## 2024-06-21 DIAGNOSIS — Z992 Dependence on renal dialysis: Secondary | ICD-10-CM | POA: Diagnosis not present

## 2024-06-23 DIAGNOSIS — Z5181 Encounter for therapeutic drug level monitoring: Secondary | ICD-10-CM | POA: Diagnosis not present

## 2024-06-23 DIAGNOSIS — N186 End stage renal disease: Secondary | ICD-10-CM | POA: Diagnosis not present

## 2024-06-23 DIAGNOSIS — D689 Coagulation defect, unspecified: Secondary | ICD-10-CM | POA: Diagnosis not present

## 2024-06-23 DIAGNOSIS — N2581 Secondary hyperparathyroidism of renal origin: Secondary | ICD-10-CM | POA: Diagnosis not present

## 2024-06-23 DIAGNOSIS — Z992 Dependence on renal dialysis: Secondary | ICD-10-CM | POA: Diagnosis not present

## 2024-06-23 DIAGNOSIS — D631 Anemia in chronic kidney disease: Secondary | ICD-10-CM | POA: Diagnosis not present

## 2024-06-25 DIAGNOSIS — Z5181 Encounter for therapeutic drug level monitoring: Secondary | ICD-10-CM | POA: Diagnosis not present

## 2024-06-25 DIAGNOSIS — N2581 Secondary hyperparathyroidism of renal origin: Secondary | ICD-10-CM | POA: Diagnosis not present

## 2024-06-25 DIAGNOSIS — D689 Coagulation defect, unspecified: Secondary | ICD-10-CM | POA: Diagnosis not present

## 2024-06-25 DIAGNOSIS — N186 End stage renal disease: Secondary | ICD-10-CM | POA: Diagnosis not present

## 2024-06-25 DIAGNOSIS — D631 Anemia in chronic kidney disease: Secondary | ICD-10-CM | POA: Diagnosis not present

## 2024-06-25 DIAGNOSIS — Z992 Dependence on renal dialysis: Secondary | ICD-10-CM | POA: Diagnosis not present

## 2024-06-26 DIAGNOSIS — Z5181 Encounter for therapeutic drug level monitoring: Secondary | ICD-10-CM | POA: Diagnosis not present

## 2024-06-26 DIAGNOSIS — D689 Coagulation defect, unspecified: Secondary | ICD-10-CM | POA: Diagnosis not present

## 2024-06-26 DIAGNOSIS — N2581 Secondary hyperparathyroidism of renal origin: Secondary | ICD-10-CM | POA: Diagnosis not present

## 2024-06-26 DIAGNOSIS — D631 Anemia in chronic kidney disease: Secondary | ICD-10-CM | POA: Diagnosis not present

## 2024-06-26 DIAGNOSIS — Z992 Dependence on renal dialysis: Secondary | ICD-10-CM | POA: Diagnosis not present

## 2024-06-26 DIAGNOSIS — N186 End stage renal disease: Secondary | ICD-10-CM | POA: Diagnosis not present

## 2024-06-28 DIAGNOSIS — D631 Anemia in chronic kidney disease: Secondary | ICD-10-CM | POA: Diagnosis not present

## 2024-06-28 DIAGNOSIS — N186 End stage renal disease: Secondary | ICD-10-CM | POA: Diagnosis not present

## 2024-06-28 DIAGNOSIS — Z992 Dependence on renal dialysis: Secondary | ICD-10-CM | POA: Diagnosis not present

## 2024-06-28 DIAGNOSIS — D689 Coagulation defect, unspecified: Secondary | ICD-10-CM | POA: Diagnosis not present

## 2024-06-28 DIAGNOSIS — Z5181 Encounter for therapeutic drug level monitoring: Secondary | ICD-10-CM | POA: Diagnosis not present

## 2024-06-28 DIAGNOSIS — N2581 Secondary hyperparathyroidism of renal origin: Secondary | ICD-10-CM | POA: Diagnosis not present

## 2024-06-30 DIAGNOSIS — N2581 Secondary hyperparathyroidism of renal origin: Secondary | ICD-10-CM | POA: Diagnosis not present

## 2024-06-30 DIAGNOSIS — D689 Coagulation defect, unspecified: Secondary | ICD-10-CM | POA: Diagnosis not present

## 2024-06-30 DIAGNOSIS — Z992 Dependence on renal dialysis: Secondary | ICD-10-CM | POA: Diagnosis not present

## 2024-06-30 DIAGNOSIS — Z5181 Encounter for therapeutic drug level monitoring: Secondary | ICD-10-CM | POA: Diagnosis not present

## 2024-06-30 DIAGNOSIS — D631 Anemia in chronic kidney disease: Secondary | ICD-10-CM | POA: Diagnosis not present

## 2024-06-30 DIAGNOSIS — N186 End stage renal disease: Secondary | ICD-10-CM | POA: Diagnosis not present

## 2024-07-03 DIAGNOSIS — N186 End stage renal disease: Secondary | ICD-10-CM | POA: Diagnosis not present

## 2024-07-03 DIAGNOSIS — N2581 Secondary hyperparathyroidism of renal origin: Secondary | ICD-10-CM | POA: Diagnosis not present

## 2024-07-03 DIAGNOSIS — Z992 Dependence on renal dialysis: Secondary | ICD-10-CM | POA: Diagnosis not present

## 2024-07-03 DIAGNOSIS — D689 Coagulation defect, unspecified: Secondary | ICD-10-CM | POA: Diagnosis not present

## 2024-07-03 DIAGNOSIS — Z5181 Encounter for therapeutic drug level monitoring: Secondary | ICD-10-CM | POA: Diagnosis not present

## 2024-07-03 DIAGNOSIS — D631 Anemia in chronic kidney disease: Secondary | ICD-10-CM | POA: Diagnosis not present

## 2024-07-05 DIAGNOSIS — Z992 Dependence on renal dialysis: Secondary | ICD-10-CM | POA: Diagnosis not present

## 2024-07-05 DIAGNOSIS — D689 Coagulation defect, unspecified: Secondary | ICD-10-CM | POA: Diagnosis not present

## 2024-07-05 DIAGNOSIS — N186 End stage renal disease: Secondary | ICD-10-CM | POA: Diagnosis not present

## 2024-07-05 DIAGNOSIS — D631 Anemia in chronic kidney disease: Secondary | ICD-10-CM | POA: Diagnosis not present

## 2024-07-05 DIAGNOSIS — Z5181 Encounter for therapeutic drug level monitoring: Secondary | ICD-10-CM | POA: Diagnosis not present

## 2024-07-05 DIAGNOSIS — N2581 Secondary hyperparathyroidism of renal origin: Secondary | ICD-10-CM | POA: Diagnosis not present

## 2024-07-07 DIAGNOSIS — Z992 Dependence on renal dialysis: Secondary | ICD-10-CM | POA: Diagnosis not present

## 2024-07-07 DIAGNOSIS — D631 Anemia in chronic kidney disease: Secondary | ICD-10-CM | POA: Diagnosis not present

## 2024-07-07 DIAGNOSIS — N186 End stage renal disease: Secondary | ICD-10-CM | POA: Diagnosis not present

## 2024-07-07 DIAGNOSIS — Z5181 Encounter for therapeutic drug level monitoring: Secondary | ICD-10-CM | POA: Diagnosis not present

## 2024-07-07 DIAGNOSIS — D689 Coagulation defect, unspecified: Secondary | ICD-10-CM | POA: Diagnosis not present

## 2024-07-07 DIAGNOSIS — N2581 Secondary hyperparathyroidism of renal origin: Secondary | ICD-10-CM | POA: Diagnosis not present

## 2024-07-10 DIAGNOSIS — N2581 Secondary hyperparathyroidism of renal origin: Secondary | ICD-10-CM | POA: Diagnosis not present

## 2024-07-10 DIAGNOSIS — N186 End stage renal disease: Secondary | ICD-10-CM | POA: Diagnosis not present

## 2024-07-10 DIAGNOSIS — Z5181 Encounter for therapeutic drug level monitoring: Secondary | ICD-10-CM | POA: Diagnosis not present

## 2024-07-10 DIAGNOSIS — Z992 Dependence on renal dialysis: Secondary | ICD-10-CM | POA: Diagnosis not present

## 2024-07-10 DIAGNOSIS — D689 Coagulation defect, unspecified: Secondary | ICD-10-CM | POA: Diagnosis not present

## 2024-07-10 DIAGNOSIS — D631 Anemia in chronic kidney disease: Secondary | ICD-10-CM | POA: Diagnosis not present

## 2024-07-12 DIAGNOSIS — D631 Anemia in chronic kidney disease: Secondary | ICD-10-CM | POA: Diagnosis not present

## 2024-07-12 DIAGNOSIS — N186 End stage renal disease: Secondary | ICD-10-CM | POA: Diagnosis not present

## 2024-07-12 DIAGNOSIS — Z992 Dependence on renal dialysis: Secondary | ICD-10-CM | POA: Diagnosis not present

## 2024-07-12 DIAGNOSIS — Z5181 Encounter for therapeutic drug level monitoring: Secondary | ICD-10-CM | POA: Diagnosis not present

## 2024-07-12 DIAGNOSIS — N2581 Secondary hyperparathyroidism of renal origin: Secondary | ICD-10-CM | POA: Diagnosis not present

## 2024-07-12 DIAGNOSIS — D689 Coagulation defect, unspecified: Secondary | ICD-10-CM | POA: Diagnosis not present

## 2024-07-13 DIAGNOSIS — N186 End stage renal disease: Secondary | ICD-10-CM | POA: Diagnosis not present

## 2024-07-13 DIAGNOSIS — Z992 Dependence on renal dialysis: Secondary | ICD-10-CM | POA: Diagnosis not present

## 2024-07-13 DIAGNOSIS — T8610 Unspecified complication of kidney transplant: Secondary | ICD-10-CM | POA: Diagnosis not present

## 2024-07-14 DIAGNOSIS — E875 Hyperkalemia: Secondary | ICD-10-CM | POA: Diagnosis not present

## 2024-07-14 DIAGNOSIS — R739 Hyperglycemia, unspecified: Secondary | ICD-10-CM | POA: Diagnosis not present

## 2024-07-14 DIAGNOSIS — D689 Coagulation defect, unspecified: Secondary | ICD-10-CM | POA: Diagnosis not present

## 2024-07-14 DIAGNOSIS — N2581 Secondary hyperparathyroidism of renal origin: Secondary | ICD-10-CM | POA: Diagnosis not present

## 2024-07-14 DIAGNOSIS — D631 Anemia in chronic kidney disease: Secondary | ICD-10-CM | POA: Diagnosis not present

## 2024-07-14 DIAGNOSIS — Z992 Dependence on renal dialysis: Secondary | ICD-10-CM | POA: Diagnosis not present

## 2024-07-14 DIAGNOSIS — Z5181 Encounter for therapeutic drug level monitoring: Secondary | ICD-10-CM | POA: Diagnosis not present

## 2024-07-14 DIAGNOSIS — N186 End stage renal disease: Secondary | ICD-10-CM | POA: Diagnosis not present

## 2024-07-14 DIAGNOSIS — R519 Headache, unspecified: Secondary | ICD-10-CM | POA: Diagnosis not present

## 2024-07-17 DIAGNOSIS — N2581 Secondary hyperparathyroidism of renal origin: Secondary | ICD-10-CM | POA: Diagnosis not present

## 2024-07-17 DIAGNOSIS — E875 Hyperkalemia: Secondary | ICD-10-CM | POA: Diagnosis not present

## 2024-07-17 DIAGNOSIS — N186 End stage renal disease: Secondary | ICD-10-CM | POA: Diagnosis not present

## 2024-07-17 DIAGNOSIS — R519 Headache, unspecified: Secondary | ICD-10-CM | POA: Diagnosis not present

## 2024-07-17 DIAGNOSIS — D631 Anemia in chronic kidney disease: Secondary | ICD-10-CM | POA: Diagnosis not present

## 2024-07-17 DIAGNOSIS — D689 Coagulation defect, unspecified: Secondary | ICD-10-CM | POA: Diagnosis not present

## 2024-07-17 DIAGNOSIS — Z5181 Encounter for therapeutic drug level monitoring: Secondary | ICD-10-CM | POA: Diagnosis not present

## 2024-07-17 DIAGNOSIS — Z992 Dependence on renal dialysis: Secondary | ICD-10-CM | POA: Diagnosis not present

## 2024-07-17 DIAGNOSIS — R739 Hyperglycemia, unspecified: Secondary | ICD-10-CM | POA: Diagnosis not present

## 2024-07-19 DIAGNOSIS — Z5181 Encounter for therapeutic drug level monitoring: Secondary | ICD-10-CM | POA: Diagnosis not present

## 2024-07-19 DIAGNOSIS — D689 Coagulation defect, unspecified: Secondary | ICD-10-CM | POA: Diagnosis not present

## 2024-07-19 DIAGNOSIS — R739 Hyperglycemia, unspecified: Secondary | ICD-10-CM | POA: Diagnosis not present

## 2024-07-19 DIAGNOSIS — E875 Hyperkalemia: Secondary | ICD-10-CM | POA: Diagnosis not present

## 2024-07-19 DIAGNOSIS — N2581 Secondary hyperparathyroidism of renal origin: Secondary | ICD-10-CM | POA: Diagnosis not present

## 2024-07-19 DIAGNOSIS — R519 Headache, unspecified: Secondary | ICD-10-CM | POA: Diagnosis not present

## 2024-07-19 DIAGNOSIS — D631 Anemia in chronic kidney disease: Secondary | ICD-10-CM | POA: Diagnosis not present

## 2024-07-19 DIAGNOSIS — N186 End stage renal disease: Secondary | ICD-10-CM | POA: Diagnosis not present

## 2024-07-19 DIAGNOSIS — Z992 Dependence on renal dialysis: Secondary | ICD-10-CM | POA: Diagnosis not present

## 2024-07-21 DIAGNOSIS — D631 Anemia in chronic kidney disease: Secondary | ICD-10-CM | POA: Diagnosis not present

## 2024-07-21 DIAGNOSIS — E875 Hyperkalemia: Secondary | ICD-10-CM | POA: Diagnosis not present

## 2024-07-21 DIAGNOSIS — Z5181 Encounter for therapeutic drug level monitoring: Secondary | ICD-10-CM | POA: Diagnosis not present

## 2024-07-21 DIAGNOSIS — R519 Headache, unspecified: Secondary | ICD-10-CM | POA: Diagnosis not present

## 2024-07-21 DIAGNOSIS — R739 Hyperglycemia, unspecified: Secondary | ICD-10-CM | POA: Diagnosis not present

## 2024-07-21 DIAGNOSIS — D689 Coagulation defect, unspecified: Secondary | ICD-10-CM | POA: Diagnosis not present

## 2024-07-21 DIAGNOSIS — Z992 Dependence on renal dialysis: Secondary | ICD-10-CM | POA: Diagnosis not present

## 2024-07-21 DIAGNOSIS — N186 End stage renal disease: Secondary | ICD-10-CM | POA: Diagnosis not present

## 2024-07-21 DIAGNOSIS — N2581 Secondary hyperparathyroidism of renal origin: Secondary | ICD-10-CM | POA: Diagnosis not present

## 2024-07-24 DIAGNOSIS — D689 Coagulation defect, unspecified: Secondary | ICD-10-CM | POA: Diagnosis not present

## 2024-07-24 DIAGNOSIS — E875 Hyperkalemia: Secondary | ICD-10-CM | POA: Diagnosis not present

## 2024-07-24 DIAGNOSIS — R519 Headache, unspecified: Secondary | ICD-10-CM | POA: Diagnosis not present

## 2024-07-24 DIAGNOSIS — Z992 Dependence on renal dialysis: Secondary | ICD-10-CM | POA: Diagnosis not present

## 2024-07-24 DIAGNOSIS — D631 Anemia in chronic kidney disease: Secondary | ICD-10-CM | POA: Diagnosis not present

## 2024-07-24 DIAGNOSIS — Z5181 Encounter for therapeutic drug level monitoring: Secondary | ICD-10-CM | POA: Diagnosis not present

## 2024-07-24 DIAGNOSIS — N2581 Secondary hyperparathyroidism of renal origin: Secondary | ICD-10-CM | POA: Diagnosis not present

## 2024-07-24 DIAGNOSIS — N186 End stage renal disease: Secondary | ICD-10-CM | POA: Diagnosis not present

## 2024-07-24 DIAGNOSIS — R739 Hyperglycemia, unspecified: Secondary | ICD-10-CM | POA: Diagnosis not present

## 2024-07-26 DIAGNOSIS — N186 End stage renal disease: Secondary | ICD-10-CM | POA: Diagnosis not present

## 2024-07-26 DIAGNOSIS — Z5181 Encounter for therapeutic drug level monitoring: Secondary | ICD-10-CM | POA: Diagnosis not present

## 2024-07-26 DIAGNOSIS — R519 Headache, unspecified: Secondary | ICD-10-CM | POA: Diagnosis not present

## 2024-07-26 DIAGNOSIS — N2581 Secondary hyperparathyroidism of renal origin: Secondary | ICD-10-CM | POA: Diagnosis not present

## 2024-07-26 DIAGNOSIS — Z992 Dependence on renal dialysis: Secondary | ICD-10-CM | POA: Diagnosis not present

## 2024-07-26 DIAGNOSIS — R739 Hyperglycemia, unspecified: Secondary | ICD-10-CM | POA: Diagnosis not present

## 2024-07-26 DIAGNOSIS — D631 Anemia in chronic kidney disease: Secondary | ICD-10-CM | POA: Diagnosis not present

## 2024-07-26 DIAGNOSIS — D689 Coagulation defect, unspecified: Secondary | ICD-10-CM | POA: Diagnosis not present

## 2024-07-26 DIAGNOSIS — E875 Hyperkalemia: Secondary | ICD-10-CM | POA: Diagnosis not present

## 2024-07-28 DIAGNOSIS — N2581 Secondary hyperparathyroidism of renal origin: Secondary | ICD-10-CM | POA: Diagnosis not present

## 2024-07-28 DIAGNOSIS — Z992 Dependence on renal dialysis: Secondary | ICD-10-CM | POA: Diagnosis not present

## 2024-07-28 DIAGNOSIS — N186 End stage renal disease: Secondary | ICD-10-CM | POA: Diagnosis not present

## 2024-07-28 DIAGNOSIS — R739 Hyperglycemia, unspecified: Secondary | ICD-10-CM | POA: Diagnosis not present

## 2024-07-28 DIAGNOSIS — D689 Coagulation defect, unspecified: Secondary | ICD-10-CM | POA: Diagnosis not present

## 2024-07-28 DIAGNOSIS — D631 Anemia in chronic kidney disease: Secondary | ICD-10-CM | POA: Diagnosis not present

## 2024-07-28 DIAGNOSIS — R519 Headache, unspecified: Secondary | ICD-10-CM | POA: Diagnosis not present

## 2024-07-28 DIAGNOSIS — Z5181 Encounter for therapeutic drug level monitoring: Secondary | ICD-10-CM | POA: Diagnosis not present

## 2024-07-28 DIAGNOSIS — E875 Hyperkalemia: Secondary | ICD-10-CM | POA: Diagnosis not present

## 2024-07-31 DIAGNOSIS — R739 Hyperglycemia, unspecified: Secondary | ICD-10-CM | POA: Diagnosis not present

## 2024-07-31 DIAGNOSIS — D689 Coagulation defect, unspecified: Secondary | ICD-10-CM | POA: Diagnosis not present

## 2024-07-31 DIAGNOSIS — N2581 Secondary hyperparathyroidism of renal origin: Secondary | ICD-10-CM | POA: Diagnosis not present

## 2024-07-31 DIAGNOSIS — Z992 Dependence on renal dialysis: Secondary | ICD-10-CM | POA: Diagnosis not present

## 2024-07-31 DIAGNOSIS — Z5181 Encounter for therapeutic drug level monitoring: Secondary | ICD-10-CM | POA: Diagnosis not present

## 2024-07-31 DIAGNOSIS — N186 End stage renal disease: Secondary | ICD-10-CM | POA: Diagnosis not present

## 2024-07-31 DIAGNOSIS — E875 Hyperkalemia: Secondary | ICD-10-CM | POA: Diagnosis not present

## 2024-07-31 DIAGNOSIS — R519 Headache, unspecified: Secondary | ICD-10-CM | POA: Diagnosis not present

## 2024-07-31 DIAGNOSIS — D631 Anemia in chronic kidney disease: Secondary | ICD-10-CM | POA: Diagnosis not present

## 2024-08-02 DIAGNOSIS — R519 Headache, unspecified: Secondary | ICD-10-CM | POA: Diagnosis not present

## 2024-08-02 DIAGNOSIS — D631 Anemia in chronic kidney disease: Secondary | ICD-10-CM | POA: Diagnosis not present

## 2024-08-02 DIAGNOSIS — D689 Coagulation defect, unspecified: Secondary | ICD-10-CM | POA: Diagnosis not present

## 2024-08-02 DIAGNOSIS — N2581 Secondary hyperparathyroidism of renal origin: Secondary | ICD-10-CM | POA: Diagnosis not present

## 2024-08-02 DIAGNOSIS — N186 End stage renal disease: Secondary | ICD-10-CM | POA: Diagnosis not present

## 2024-08-02 DIAGNOSIS — R739 Hyperglycemia, unspecified: Secondary | ICD-10-CM | POA: Diagnosis not present

## 2024-08-02 DIAGNOSIS — E875 Hyperkalemia: Secondary | ICD-10-CM | POA: Diagnosis not present

## 2024-08-02 DIAGNOSIS — Z5181 Encounter for therapeutic drug level monitoring: Secondary | ICD-10-CM | POA: Diagnosis not present

## 2024-08-02 DIAGNOSIS — Z992 Dependence on renal dialysis: Secondary | ICD-10-CM | POA: Diagnosis not present

## 2024-08-04 DIAGNOSIS — E875 Hyperkalemia: Secondary | ICD-10-CM | POA: Diagnosis not present

## 2024-08-04 DIAGNOSIS — Z5181 Encounter for therapeutic drug level monitoring: Secondary | ICD-10-CM | POA: Diagnosis not present

## 2024-08-04 DIAGNOSIS — D631 Anemia in chronic kidney disease: Secondary | ICD-10-CM | POA: Diagnosis not present

## 2024-08-04 DIAGNOSIS — Z992 Dependence on renal dialysis: Secondary | ICD-10-CM | POA: Diagnosis not present

## 2024-08-04 DIAGNOSIS — N2581 Secondary hyperparathyroidism of renal origin: Secondary | ICD-10-CM | POA: Diagnosis not present

## 2024-08-04 DIAGNOSIS — R739 Hyperglycemia, unspecified: Secondary | ICD-10-CM | POA: Diagnosis not present

## 2024-08-04 DIAGNOSIS — R519 Headache, unspecified: Secondary | ICD-10-CM | POA: Diagnosis not present

## 2024-08-04 DIAGNOSIS — D689 Coagulation defect, unspecified: Secondary | ICD-10-CM | POA: Diagnosis not present

## 2024-08-04 DIAGNOSIS — N186 End stage renal disease: Secondary | ICD-10-CM | POA: Diagnosis not present

## 2024-08-07 DIAGNOSIS — D689 Coagulation defect, unspecified: Secondary | ICD-10-CM | POA: Diagnosis not present

## 2024-08-07 DIAGNOSIS — D631 Anemia in chronic kidney disease: Secondary | ICD-10-CM | POA: Diagnosis not present

## 2024-08-07 DIAGNOSIS — N186 End stage renal disease: Secondary | ICD-10-CM | POA: Diagnosis not present

## 2024-08-07 DIAGNOSIS — R519 Headache, unspecified: Secondary | ICD-10-CM | POA: Diagnosis not present

## 2024-08-07 DIAGNOSIS — N2581 Secondary hyperparathyroidism of renal origin: Secondary | ICD-10-CM | POA: Diagnosis not present

## 2024-08-07 DIAGNOSIS — Z5181 Encounter for therapeutic drug level monitoring: Secondary | ICD-10-CM | POA: Diagnosis not present

## 2024-08-07 DIAGNOSIS — R739 Hyperglycemia, unspecified: Secondary | ICD-10-CM | POA: Diagnosis not present

## 2024-08-07 DIAGNOSIS — Z992 Dependence on renal dialysis: Secondary | ICD-10-CM | POA: Diagnosis not present

## 2024-08-07 DIAGNOSIS — E875 Hyperkalemia: Secondary | ICD-10-CM | POA: Diagnosis not present

## 2024-08-09 DIAGNOSIS — D689 Coagulation defect, unspecified: Secondary | ICD-10-CM | POA: Diagnosis not present

## 2024-08-09 DIAGNOSIS — Z992 Dependence on renal dialysis: Secondary | ICD-10-CM | POA: Diagnosis not present

## 2024-08-09 DIAGNOSIS — N186 End stage renal disease: Secondary | ICD-10-CM | POA: Diagnosis not present

## 2024-08-09 DIAGNOSIS — Z5181 Encounter for therapeutic drug level monitoring: Secondary | ICD-10-CM | POA: Diagnosis not present

## 2024-08-09 DIAGNOSIS — N2581 Secondary hyperparathyroidism of renal origin: Secondary | ICD-10-CM | POA: Diagnosis not present

## 2024-08-09 DIAGNOSIS — R519 Headache, unspecified: Secondary | ICD-10-CM | POA: Diagnosis not present

## 2024-08-09 DIAGNOSIS — R739 Hyperglycemia, unspecified: Secondary | ICD-10-CM | POA: Diagnosis not present

## 2024-08-09 DIAGNOSIS — E875 Hyperkalemia: Secondary | ICD-10-CM | POA: Diagnosis not present

## 2024-08-09 DIAGNOSIS — D631 Anemia in chronic kidney disease: Secondary | ICD-10-CM | POA: Diagnosis not present

## 2024-08-11 DIAGNOSIS — N186 End stage renal disease: Secondary | ICD-10-CM | POA: Diagnosis not present

## 2024-08-11 DIAGNOSIS — E875 Hyperkalemia: Secondary | ICD-10-CM | POA: Diagnosis not present

## 2024-08-11 DIAGNOSIS — Z992 Dependence on renal dialysis: Secondary | ICD-10-CM | POA: Diagnosis not present

## 2024-08-11 DIAGNOSIS — N2581 Secondary hyperparathyroidism of renal origin: Secondary | ICD-10-CM | POA: Diagnosis not present

## 2024-08-11 DIAGNOSIS — D689 Coagulation defect, unspecified: Secondary | ICD-10-CM | POA: Diagnosis not present

## 2024-08-11 DIAGNOSIS — Z5181 Encounter for therapeutic drug level monitoring: Secondary | ICD-10-CM | POA: Diagnosis not present

## 2024-08-11 DIAGNOSIS — D631 Anemia in chronic kidney disease: Secondary | ICD-10-CM | POA: Diagnosis not present

## 2024-08-11 DIAGNOSIS — R519 Headache, unspecified: Secondary | ICD-10-CM | POA: Diagnosis not present

## 2024-08-11 DIAGNOSIS — R739 Hyperglycemia, unspecified: Secondary | ICD-10-CM | POA: Diagnosis not present

## 2024-08-13 DIAGNOSIS — T8610 Unspecified complication of kidney transplant: Secondary | ICD-10-CM | POA: Diagnosis not present

## 2024-08-13 DIAGNOSIS — Z992 Dependence on renal dialysis: Secondary | ICD-10-CM | POA: Diagnosis not present

## 2024-08-13 DIAGNOSIS — N186 End stage renal disease: Secondary | ICD-10-CM | POA: Diagnosis not present

## 2024-08-14 DIAGNOSIS — N186 End stage renal disease: Secondary | ICD-10-CM | POA: Diagnosis not present

## 2024-08-14 DIAGNOSIS — Z5181 Encounter for therapeutic drug level monitoring: Secondary | ICD-10-CM | POA: Diagnosis not present

## 2024-08-14 DIAGNOSIS — N2581 Secondary hyperparathyroidism of renal origin: Secondary | ICD-10-CM | POA: Diagnosis not present

## 2024-08-14 DIAGNOSIS — D689 Coagulation defect, unspecified: Secondary | ICD-10-CM | POA: Diagnosis not present

## 2024-08-14 DIAGNOSIS — Z992 Dependence on renal dialysis: Secondary | ICD-10-CM | POA: Diagnosis not present

## 2024-08-14 DIAGNOSIS — E875 Hyperkalemia: Secondary | ICD-10-CM | POA: Diagnosis not present

## 2024-08-14 DIAGNOSIS — L299 Pruritus, unspecified: Secondary | ICD-10-CM | POA: Diagnosis not present

## 2024-08-14 DIAGNOSIS — D631 Anemia in chronic kidney disease: Secondary | ICD-10-CM | POA: Diagnosis not present

## 2024-08-16 DIAGNOSIS — N2581 Secondary hyperparathyroidism of renal origin: Secondary | ICD-10-CM | POA: Diagnosis not present

## 2024-08-16 DIAGNOSIS — D689 Coagulation defect, unspecified: Secondary | ICD-10-CM | POA: Diagnosis not present

## 2024-08-16 DIAGNOSIS — Z5181 Encounter for therapeutic drug level monitoring: Secondary | ICD-10-CM | POA: Diagnosis not present

## 2024-08-16 DIAGNOSIS — L299 Pruritus, unspecified: Secondary | ICD-10-CM | POA: Diagnosis not present

## 2024-08-16 DIAGNOSIS — N186 End stage renal disease: Secondary | ICD-10-CM | POA: Diagnosis not present

## 2024-08-16 DIAGNOSIS — E875 Hyperkalemia: Secondary | ICD-10-CM | POA: Diagnosis not present

## 2024-08-16 DIAGNOSIS — D631 Anemia in chronic kidney disease: Secondary | ICD-10-CM | POA: Diagnosis not present

## 2024-08-16 DIAGNOSIS — Z992 Dependence on renal dialysis: Secondary | ICD-10-CM | POA: Diagnosis not present

## 2024-08-18 DIAGNOSIS — E875 Hyperkalemia: Secondary | ICD-10-CM | POA: Diagnosis not present

## 2024-08-18 DIAGNOSIS — N186 End stage renal disease: Secondary | ICD-10-CM | POA: Diagnosis not present

## 2024-08-18 DIAGNOSIS — D631 Anemia in chronic kidney disease: Secondary | ICD-10-CM | POA: Diagnosis not present

## 2024-08-18 DIAGNOSIS — L299 Pruritus, unspecified: Secondary | ICD-10-CM | POA: Diagnosis not present

## 2024-08-18 DIAGNOSIS — D689 Coagulation defect, unspecified: Secondary | ICD-10-CM | POA: Diagnosis not present

## 2024-08-18 DIAGNOSIS — Z5181 Encounter for therapeutic drug level monitoring: Secondary | ICD-10-CM | POA: Diagnosis not present

## 2024-08-18 DIAGNOSIS — Z992 Dependence on renal dialysis: Secondary | ICD-10-CM | POA: Diagnosis not present

## 2024-08-18 DIAGNOSIS — N2581 Secondary hyperparathyroidism of renal origin: Secondary | ICD-10-CM | POA: Diagnosis not present

## 2024-08-21 DIAGNOSIS — E875 Hyperkalemia: Secondary | ICD-10-CM | POA: Diagnosis not present

## 2024-08-21 DIAGNOSIS — Z992 Dependence on renal dialysis: Secondary | ICD-10-CM | POA: Diagnosis not present

## 2024-08-21 DIAGNOSIS — N186 End stage renal disease: Secondary | ICD-10-CM | POA: Diagnosis not present

## 2024-08-21 DIAGNOSIS — Z5181 Encounter for therapeutic drug level monitoring: Secondary | ICD-10-CM | POA: Diagnosis not present

## 2024-08-21 DIAGNOSIS — D689 Coagulation defect, unspecified: Secondary | ICD-10-CM | POA: Diagnosis not present

## 2024-08-21 DIAGNOSIS — L299 Pruritus, unspecified: Secondary | ICD-10-CM | POA: Diagnosis not present

## 2024-08-21 DIAGNOSIS — D631 Anemia in chronic kidney disease: Secondary | ICD-10-CM | POA: Diagnosis not present

## 2024-08-21 DIAGNOSIS — N2581 Secondary hyperparathyroidism of renal origin: Secondary | ICD-10-CM | POA: Diagnosis not present

## 2024-08-23 DIAGNOSIS — E875 Hyperkalemia: Secondary | ICD-10-CM | POA: Diagnosis not present

## 2024-08-23 DIAGNOSIS — N186 End stage renal disease: Secondary | ICD-10-CM | POA: Diagnosis not present

## 2024-08-23 DIAGNOSIS — Z5181 Encounter for therapeutic drug level monitoring: Secondary | ICD-10-CM | POA: Diagnosis not present

## 2024-08-23 DIAGNOSIS — N2581 Secondary hyperparathyroidism of renal origin: Secondary | ICD-10-CM | POA: Diagnosis not present

## 2024-08-23 DIAGNOSIS — L299 Pruritus, unspecified: Secondary | ICD-10-CM | POA: Diagnosis not present

## 2024-08-23 DIAGNOSIS — D631 Anemia in chronic kidney disease: Secondary | ICD-10-CM | POA: Diagnosis not present

## 2024-08-23 DIAGNOSIS — Z992 Dependence on renal dialysis: Secondary | ICD-10-CM | POA: Diagnosis not present

## 2024-08-23 DIAGNOSIS — D689 Coagulation defect, unspecified: Secondary | ICD-10-CM | POA: Diagnosis not present

## 2024-08-25 DIAGNOSIS — E875 Hyperkalemia: Secondary | ICD-10-CM | POA: Diagnosis not present

## 2024-08-25 DIAGNOSIS — N186 End stage renal disease: Secondary | ICD-10-CM | POA: Diagnosis not present

## 2024-08-25 DIAGNOSIS — D689 Coagulation defect, unspecified: Secondary | ICD-10-CM | POA: Diagnosis not present

## 2024-08-25 DIAGNOSIS — L299 Pruritus, unspecified: Secondary | ICD-10-CM | POA: Diagnosis not present

## 2024-08-25 DIAGNOSIS — Z992 Dependence on renal dialysis: Secondary | ICD-10-CM | POA: Diagnosis not present

## 2024-08-25 DIAGNOSIS — D631 Anemia in chronic kidney disease: Secondary | ICD-10-CM | POA: Diagnosis not present

## 2024-08-25 DIAGNOSIS — Z5181 Encounter for therapeutic drug level monitoring: Secondary | ICD-10-CM | POA: Diagnosis not present

## 2024-08-25 DIAGNOSIS — N2581 Secondary hyperparathyroidism of renal origin: Secondary | ICD-10-CM | POA: Diagnosis not present

## 2024-08-28 DIAGNOSIS — L299 Pruritus, unspecified: Secondary | ICD-10-CM | POA: Diagnosis not present

## 2024-08-28 DIAGNOSIS — Z5181 Encounter for therapeutic drug level monitoring: Secondary | ICD-10-CM | POA: Diagnosis not present

## 2024-08-28 DIAGNOSIS — N2581 Secondary hyperparathyroidism of renal origin: Secondary | ICD-10-CM | POA: Diagnosis not present

## 2024-08-28 DIAGNOSIS — E875 Hyperkalemia: Secondary | ICD-10-CM | POA: Diagnosis not present

## 2024-08-28 DIAGNOSIS — N186 End stage renal disease: Secondary | ICD-10-CM | POA: Diagnosis not present

## 2024-08-28 DIAGNOSIS — Z992 Dependence on renal dialysis: Secondary | ICD-10-CM | POA: Diagnosis not present

## 2024-08-28 DIAGNOSIS — D631 Anemia in chronic kidney disease: Secondary | ICD-10-CM | POA: Diagnosis not present

## 2024-08-28 DIAGNOSIS — D689 Coagulation defect, unspecified: Secondary | ICD-10-CM | POA: Diagnosis not present

## 2024-08-30 DIAGNOSIS — L299 Pruritus, unspecified: Secondary | ICD-10-CM | POA: Diagnosis not present

## 2024-08-30 DIAGNOSIS — Z5181 Encounter for therapeutic drug level monitoring: Secondary | ICD-10-CM | POA: Diagnosis not present

## 2024-08-30 DIAGNOSIS — N2581 Secondary hyperparathyroidism of renal origin: Secondary | ICD-10-CM | POA: Diagnosis not present

## 2024-08-30 DIAGNOSIS — N186 End stage renal disease: Secondary | ICD-10-CM | POA: Diagnosis not present

## 2024-08-30 DIAGNOSIS — E875 Hyperkalemia: Secondary | ICD-10-CM | POA: Diagnosis not present

## 2024-08-30 DIAGNOSIS — Z992 Dependence on renal dialysis: Secondary | ICD-10-CM | POA: Diagnosis not present

## 2024-08-30 DIAGNOSIS — D689 Coagulation defect, unspecified: Secondary | ICD-10-CM | POA: Diagnosis not present

## 2024-08-30 DIAGNOSIS — D631 Anemia in chronic kidney disease: Secondary | ICD-10-CM | POA: Diagnosis not present

## 2024-09-01 DIAGNOSIS — D689 Coagulation defect, unspecified: Secondary | ICD-10-CM | POA: Diagnosis not present

## 2024-09-01 DIAGNOSIS — N186 End stage renal disease: Secondary | ICD-10-CM | POA: Diagnosis not present

## 2024-09-01 DIAGNOSIS — E875 Hyperkalemia: Secondary | ICD-10-CM | POA: Diagnosis not present

## 2024-09-01 DIAGNOSIS — Z5181 Encounter for therapeutic drug level monitoring: Secondary | ICD-10-CM | POA: Diagnosis not present

## 2024-09-01 DIAGNOSIS — L299 Pruritus, unspecified: Secondary | ICD-10-CM | POA: Diagnosis not present

## 2024-09-01 DIAGNOSIS — D631 Anemia in chronic kidney disease: Secondary | ICD-10-CM | POA: Diagnosis not present

## 2024-09-01 DIAGNOSIS — N2581 Secondary hyperparathyroidism of renal origin: Secondary | ICD-10-CM | POA: Diagnosis not present

## 2024-09-01 DIAGNOSIS — Z992 Dependence on renal dialysis: Secondary | ICD-10-CM | POA: Diagnosis not present

## 2024-09-04 DIAGNOSIS — D631 Anemia in chronic kidney disease: Secondary | ICD-10-CM | POA: Diagnosis not present

## 2024-09-04 DIAGNOSIS — Z992 Dependence on renal dialysis: Secondary | ICD-10-CM | POA: Diagnosis not present

## 2024-09-04 DIAGNOSIS — N186 End stage renal disease: Secondary | ICD-10-CM | POA: Diagnosis not present

## 2024-09-04 DIAGNOSIS — N2581 Secondary hyperparathyroidism of renal origin: Secondary | ICD-10-CM | POA: Diagnosis not present

## 2024-09-04 DIAGNOSIS — D689 Coagulation defect, unspecified: Secondary | ICD-10-CM | POA: Diagnosis not present

## 2024-09-04 DIAGNOSIS — E875 Hyperkalemia: Secondary | ICD-10-CM | POA: Diagnosis not present

## 2024-09-04 DIAGNOSIS — L299 Pruritus, unspecified: Secondary | ICD-10-CM | POA: Diagnosis not present

## 2024-09-04 DIAGNOSIS — Z5181 Encounter for therapeutic drug level monitoring: Secondary | ICD-10-CM | POA: Diagnosis not present

## 2024-09-06 DIAGNOSIS — N2581 Secondary hyperparathyroidism of renal origin: Secondary | ICD-10-CM | POA: Diagnosis not present

## 2024-09-06 DIAGNOSIS — N186 End stage renal disease: Secondary | ICD-10-CM | POA: Diagnosis not present

## 2024-09-06 DIAGNOSIS — E875 Hyperkalemia: Secondary | ICD-10-CM | POA: Diagnosis not present

## 2024-09-06 DIAGNOSIS — D631 Anemia in chronic kidney disease: Secondary | ICD-10-CM | POA: Diagnosis not present

## 2024-09-06 DIAGNOSIS — D689 Coagulation defect, unspecified: Secondary | ICD-10-CM | POA: Diagnosis not present

## 2024-09-06 DIAGNOSIS — Z5181 Encounter for therapeutic drug level monitoring: Secondary | ICD-10-CM | POA: Diagnosis not present

## 2024-09-06 DIAGNOSIS — L299 Pruritus, unspecified: Secondary | ICD-10-CM | POA: Diagnosis not present

## 2024-09-06 DIAGNOSIS — Z992 Dependence on renal dialysis: Secondary | ICD-10-CM | POA: Diagnosis not present

## 2024-09-09 DIAGNOSIS — D689 Coagulation defect, unspecified: Secondary | ICD-10-CM | POA: Diagnosis not present

## 2024-09-09 DIAGNOSIS — N186 End stage renal disease: Secondary | ICD-10-CM | POA: Diagnosis not present

## 2024-09-09 DIAGNOSIS — E875 Hyperkalemia: Secondary | ICD-10-CM | POA: Diagnosis not present

## 2024-09-09 DIAGNOSIS — Z992 Dependence on renal dialysis: Secondary | ICD-10-CM | POA: Diagnosis not present

## 2024-09-09 DIAGNOSIS — Z5181 Encounter for therapeutic drug level monitoring: Secondary | ICD-10-CM | POA: Diagnosis not present

## 2024-09-09 DIAGNOSIS — N2581 Secondary hyperparathyroidism of renal origin: Secondary | ICD-10-CM | POA: Diagnosis not present

## 2024-09-09 DIAGNOSIS — L299 Pruritus, unspecified: Secondary | ICD-10-CM | POA: Diagnosis not present

## 2024-09-09 DIAGNOSIS — D631 Anemia in chronic kidney disease: Secondary | ICD-10-CM | POA: Diagnosis not present

## 2024-09-11 DIAGNOSIS — D631 Anemia in chronic kidney disease: Secondary | ICD-10-CM | POA: Diagnosis not present

## 2024-09-11 DIAGNOSIS — L299 Pruritus, unspecified: Secondary | ICD-10-CM | POA: Diagnosis not present

## 2024-09-11 DIAGNOSIS — Z5181 Encounter for therapeutic drug level monitoring: Secondary | ICD-10-CM | POA: Diagnosis not present

## 2024-09-11 DIAGNOSIS — N2581 Secondary hyperparathyroidism of renal origin: Secondary | ICD-10-CM | POA: Diagnosis not present

## 2024-09-11 DIAGNOSIS — Z992 Dependence on renal dialysis: Secondary | ICD-10-CM | POA: Diagnosis not present

## 2024-09-11 DIAGNOSIS — E875 Hyperkalemia: Secondary | ICD-10-CM | POA: Diagnosis not present

## 2024-09-11 DIAGNOSIS — D689 Coagulation defect, unspecified: Secondary | ICD-10-CM | POA: Diagnosis not present

## 2024-09-11 DIAGNOSIS — N186 End stage renal disease: Secondary | ICD-10-CM | POA: Diagnosis not present

## 2024-09-12 DIAGNOSIS — T8610 Unspecified complication of kidney transplant: Secondary | ICD-10-CM | POA: Diagnosis not present

## 2024-09-12 DIAGNOSIS — N186 End stage renal disease: Secondary | ICD-10-CM | POA: Diagnosis not present

## 2024-09-12 DIAGNOSIS — Z992 Dependence on renal dialysis: Secondary | ICD-10-CM | POA: Diagnosis not present
